# Patient Record
Sex: Female | Born: 1945 | Race: White | Hispanic: No | Smoking: Former smoker
Health system: Southern US, Community
[De-identification: ages and names within clinical notes are randomized; demographics above are authoritative.]

## PROBLEM LIST (undated history)

## (undated) DIAGNOSIS — K08109 Complete loss of teeth, unspecified cause, unspecified class: Secondary | ICD-10-CM

## (undated) DIAGNOSIS — F419 Anxiety disorder, unspecified: Secondary | ICD-10-CM

## (undated) DIAGNOSIS — T753XXA Motion sickness, initial encounter: Secondary | ICD-10-CM

## (undated) DIAGNOSIS — K219 Gastro-esophageal reflux disease without esophagitis: Secondary | ICD-10-CM

## (undated) DIAGNOSIS — M069 Rheumatoid arthritis, unspecified: Secondary | ICD-10-CM

## (undated) DIAGNOSIS — R42 Dizziness and giddiness: Secondary | ICD-10-CM

## (undated) DIAGNOSIS — K828 Other specified diseases of gallbladder: Secondary | ICD-10-CM

## (undated) DIAGNOSIS — R0600 Dyspnea, unspecified: Secondary | ICD-10-CM

## (undated) DIAGNOSIS — J432 Centrilobular emphysema: Secondary | ICD-10-CM

## (undated) DIAGNOSIS — D649 Anemia, unspecified: Secondary | ICD-10-CM

## (undated) DIAGNOSIS — H3500 Unspecified background retinopathy: Secondary | ICD-10-CM

## (undated) DIAGNOSIS — G43909 Migraine, unspecified, not intractable, without status migrainosus: Secondary | ICD-10-CM

## (undated) DIAGNOSIS — K760 Fatty (change of) liver, not elsewhere classified: Secondary | ICD-10-CM

## (undated) DIAGNOSIS — M810 Age-related osteoporosis without current pathological fracture: Secondary | ICD-10-CM

## (undated) DIAGNOSIS — M359 Systemic involvement of connective tissue, unspecified: Secondary | ICD-10-CM

## (undated) DIAGNOSIS — Z8781 Personal history of (healed) traumatic fracture: Secondary | ICD-10-CM

## (undated) DIAGNOSIS — M1990 Unspecified osteoarthritis, unspecified site: Secondary | ICD-10-CM

## (undated) DIAGNOSIS — K000 Anodontia: Secondary | ICD-10-CM

## (undated) DIAGNOSIS — N059 Unspecified nephritic syndrome with unspecified morphologic changes: Secondary | ICD-10-CM

## (undated) DIAGNOSIS — D8989 Other specified disorders involving the immune mechanism, not elsewhere classified: Secondary | ICD-10-CM

## (undated) DIAGNOSIS — D509 Iron deficiency anemia, unspecified: Secondary | ICD-10-CM

## (undated) HISTORY — PX: COLONOSCOPY: SHX174

## (undated) HISTORY — PX: ESOPHAGOGASTRODUODENOSCOPY: SHX1529

---

## 2001-03-16 HISTORY — PX: OOPHORECTOMY: SHX86

## 2006-01-20 ENCOUNTER — Ambulatory Visit: Payer: Self-pay | Admitting: Internal Medicine

## 2006-04-29 ENCOUNTER — Other Ambulatory Visit: Payer: Self-pay

## 2006-04-29 ENCOUNTER — Emergency Department: Payer: Self-pay | Admitting: Emergency Medicine

## 2007-02-15 ENCOUNTER — Other Ambulatory Visit: Payer: Self-pay

## 2007-02-15 ENCOUNTER — Emergency Department: Payer: Self-pay | Admitting: Emergency Medicine

## 2007-07-11 ENCOUNTER — Emergency Department: Payer: Self-pay | Admitting: Emergency Medicine

## 2008-09-12 ENCOUNTER — Ambulatory Visit: Payer: Self-pay | Admitting: Internal Medicine

## 2008-09-17 ENCOUNTER — Other Ambulatory Visit: Payer: Self-pay | Admitting: Ophthalmology

## 2008-09-28 ENCOUNTER — Ambulatory Visit: Payer: Self-pay | Admitting: Family Medicine

## 2008-10-12 ENCOUNTER — Ambulatory Visit: Payer: Self-pay | Admitting: Gastroenterology

## 2008-10-23 ENCOUNTER — Ambulatory Visit: Payer: Self-pay | Admitting: Gastroenterology

## 2008-10-31 ENCOUNTER — Ambulatory Visit: Payer: Self-pay | Admitting: Gastroenterology

## 2009-02-12 ENCOUNTER — Ambulatory Visit: Payer: Self-pay | Admitting: Internal Medicine

## 2009-02-19 ENCOUNTER — Ambulatory Visit: Payer: Self-pay | Admitting: Internal Medicine

## 2009-03-18 ENCOUNTER — Ambulatory Visit: Payer: Self-pay | Admitting: Gastroenterology

## 2009-06-01 ENCOUNTER — Emergency Department: Payer: Self-pay | Admitting: Emergency Medicine

## 2009-08-14 ENCOUNTER — Ambulatory Visit: Payer: Self-pay | Admitting: Oncology

## 2009-08-22 ENCOUNTER — Ambulatory Visit: Payer: Self-pay | Admitting: Internal Medicine

## 2009-09-11 ENCOUNTER — Ambulatory Visit: Payer: Self-pay | Admitting: Oncology

## 2009-09-13 ENCOUNTER — Ambulatory Visit: Payer: Self-pay | Admitting: Oncology

## 2009-10-14 ENCOUNTER — Ambulatory Visit: Payer: Self-pay | Admitting: Oncology

## 2010-06-13 ENCOUNTER — Ambulatory Visit: Payer: Self-pay | Admitting: Gastroenterology

## 2010-06-18 ENCOUNTER — Ambulatory Visit: Payer: Self-pay | Admitting: Gastroenterology

## 2010-06-18 LAB — PATHOLOGY REPORT

## 2011-06-05 ENCOUNTER — Ambulatory Visit: Payer: Self-pay | Admitting: Gastroenterology

## 2011-06-09 LAB — PATHOLOGY REPORT

## 2012-03-29 DIAGNOSIS — H53419 Scotoma involving central area, unspecified eye: Secondary | ICD-10-CM | POA: Insufficient documentation

## 2013-07-31 ENCOUNTER — Ambulatory Visit: Payer: Self-pay | Admitting: Internal Medicine

## 2013-08-03 DIAGNOSIS — M810 Age-related osteoporosis without current pathological fracture: Secondary | ICD-10-CM | POA: Insufficient documentation

## 2013-08-03 DIAGNOSIS — H359 Unspecified retinal disorder: Secondary | ICD-10-CM | POA: Insufficient documentation

## 2013-12-10 DIAGNOSIS — G43909 Migraine, unspecified, not intractable, without status migrainosus: Secondary | ICD-10-CM | POA: Insufficient documentation

## 2013-12-10 DIAGNOSIS — I10 Essential (primary) hypertension: Secondary | ICD-10-CM | POA: Insufficient documentation

## 2013-12-10 DIAGNOSIS — F5104 Psychophysiologic insomnia: Secondary | ICD-10-CM | POA: Insufficient documentation

## 2013-12-25 DIAGNOSIS — I700 Atherosclerosis of aorta: Secondary | ICD-10-CM | POA: Insufficient documentation

## 2014-01-22 DIAGNOSIS — M47814 Spondylosis without myelopathy or radiculopathy, thoracic region: Secondary | ICD-10-CM | POA: Insufficient documentation

## 2014-05-13 ENCOUNTER — Emergency Department: Payer: Self-pay | Admitting: Internal Medicine

## 2014-05-25 ENCOUNTER — Ambulatory Visit: Payer: Self-pay | Admitting: Internal Medicine

## 2014-06-21 DIAGNOSIS — R0602 Shortness of breath: Secondary | ICD-10-CM | POA: Insufficient documentation

## 2014-06-21 DIAGNOSIS — R5382 Chronic fatigue, unspecified: Secondary | ICD-10-CM | POA: Insufficient documentation

## 2014-06-27 ENCOUNTER — Ambulatory Visit: Admit: 2014-06-27 | Disposition: A | Discharge status: Home / Self Care | Payer: Self-pay | Admitting: Gastroenterology

## 2014-07-04 ENCOUNTER — Ambulatory Visit
Admit: 2014-07-04 | Disposition: A | Discharge status: Home / Self Care | Payer: Self-pay | Attending: Gastroenterology | Admitting: Gastroenterology

## 2014-10-08 ENCOUNTER — Emergency Department
Admission: EM | Admit: 2014-10-08 | Discharge: 2014-10-08 | Disposition: A | Discharge status: Home / Self Care | Payer: Medicare Other | Attending: Emergency Medicine | Admitting: Emergency Medicine

## 2014-10-08 ENCOUNTER — Encounter: Payer: Self-pay | Admitting: Emergency Medicine

## 2014-10-08 DIAGNOSIS — Y998 Other external cause status: Secondary | ICD-10-CM | POA: Insufficient documentation

## 2014-10-08 DIAGNOSIS — X58XXXA Exposure to other specified factors, initial encounter: Secondary | ICD-10-CM | POA: Diagnosis not present

## 2014-10-08 DIAGNOSIS — Z87891 Personal history of nicotine dependence: Secondary | ICD-10-CM | POA: Insufficient documentation

## 2014-10-08 DIAGNOSIS — Y9389 Activity, other specified: Secondary | ICD-10-CM | POA: Diagnosis not present

## 2014-10-08 DIAGNOSIS — S3992XA Unspecified injury of lower back, initial encounter: Secondary | ICD-10-CM | POA: Diagnosis present

## 2014-10-08 DIAGNOSIS — Y9289 Other specified places as the place of occurrence of the external cause: Secondary | ICD-10-CM | POA: Diagnosis not present

## 2014-10-08 DIAGNOSIS — S29012A Strain of muscle and tendon of back wall of thorax, initial encounter: Secondary | ICD-10-CM | POA: Insufficient documentation

## 2014-10-08 DIAGNOSIS — S29019A Strain of muscle and tendon of unspecified wall of thorax, initial encounter: Secondary | ICD-10-CM

## 2014-10-08 HISTORY — DX: Gastro-esophageal reflux disease without esophagitis: K21.9

## 2014-10-08 MED ORDER — TRAMADOL HCL 50 MG PO TABS: 50.0000 mg | ORAL_TABLET | Freq: Four times a day (QID) | ORAL | Status: DC | PRN

## 2014-10-08 MED ORDER — KETOROLAC TROMETHAMINE 30 MG/ML IJ SOLN
30.0000 mg | Freq: Once | INTRAMUSCULAR | Status: AC
  Administered 2014-10-08: 30 mg via INTRAMUSCULAR
  Filled 2014-10-08: qty 1

## 2014-10-08 MED ORDER — METHOCARBAMOL 500 MG PO TABS: 500.0000 mg | ORAL_TABLET | Freq: Four times a day (QID) | ORAL | Status: DC

## 2014-10-08 MED ORDER — HYDROMORPHONE HCL 1 MG/ML IJ SOLN
0.5000 mg | Freq: Once | INTRAMUSCULAR | Status: AC
Start: 2014-10-08 — End: 2014-10-08
  Administered 2014-10-08: 0.5 mg via INTRAMUSCULAR
  Filled 2014-10-08: qty 1

## 2014-10-08 MED ORDER — ORPHENADRINE CITRATE 30 MG/ML IJ SOLN
60.0000 mg | Freq: Two times a day (BID) | INTRAMUSCULAR | Status: DC
  Administered 2014-10-08: 60 mg via INTRAMUSCULAR
  Filled 2014-10-08: qty 2

## 2014-10-08 NOTE — ED Notes (Signed)
Developed mid to lower back after lifting heavy groceries . The pain is mainly mid back on palpation increased pain with ambulation or movement

## 2014-10-08 NOTE — ED Notes (Signed)
Pt to ed with c/o mid and lower back pain bilat that started on Friday after she lifted heavy groceries.  Pt states pain is constant and shooting, severe pain. Reports she went to see Chiropractor today but was sent here after she was in so much pain.

## 2014-10-08 NOTE — ED Notes (Signed)
Discharge instructions and prescriptions given and reviewed with patient.  Patient verbalized understanding to take medications as prescribed and to follow up with PMD or return to ED for new/worsening symptoms.  Patient discharged home in good condition via wheelchair.  Family member accompanied discharge to drive home.

## 2014-10-08 NOTE — ED Provider Notes (Signed)
[facility] Emergency Department Provider Note  ____________________________________________  Time seen: Approximately 12:34 PM  I have reviewed the triage vital signs and the nursing notes.   HISTORY  Chief Complaint Back Pain    HPI Ashley Mack is a 69 y.o. female complaining of sharp mid and low back pain secondary to a heavy list incident 3 days ago. She states she was lifting groceries and after that was his wishes*expressive back pain which has increased patient states she went to a crop practice today however due to her severe pain he advised to come to the emergency room. Patient denies any radicular component to this pain she denies any bladder or bowel dysfunction. Patient states she is unable sleep secondary to the pain. As stated over-the-counter medication has not helped.Patient is rating her pain as 8/10.   Past Medical History  Diagnosis Date  . GERD (gastroesophageal reflux disease)     There are no active problems to display for this patient.   Past Surgical History  Procedure Laterality Date  . Oophorectomy Bilateral     Current Outpatient Rx  Name  Route  Sig  Dispense  Refill  . methocarbamol (ROBAXIN) 500 MG tablet   Oral   Take 1 tablet (500 mg total) by mouth 4 (four) times daily.   20 tablet   0   . traMADol (ULTRAM) 50 MG tablet   Oral   Take 1 tablet (50 mg total) by mouth every 6 (six) hours as needed for moderate pain.   12 tablet   0     Allergies Review of patient's allergies indicates no known allergies.  History reviewed. No pertinent family history.  Social History History  Substance Use Topics  . Smoking status: Former Research scientist (life sciences)  . Smokeless tobacco: Not on file  . Alcohol Use: No    Review of Systems Constitutional: No fever/chills Eyes: No visual changes. ENT: No sore throat. Cardiovascular: Denies chest pain. Respiratory: Denies shortness of breath. Gastrointestinal: No abdominal pain.  No  nausea, no vomiting.  No diarrhea.  No constipation. Genitourinary: Negative for dysuria. Musculoskeletal: Negative for back pain. Skin: Negative for rash. Neurological: Negative for headaches, focal weakness or numbness. 10-point ROS otherwise negative.  ____________________________________________   PHYSICAL EXAM:  VITAL SIGNS: ED Triage Vitals  Enc Vitals Group     BP 10/08/14 1118 151/76 mmHg     Pulse Rate 10/08/14 1118 107     Resp 10/08/14 1118 18     Temp 10/08/14 1118 98.2 F (36.8 C)     Temp Source 10/08/14 1118 Oral     SpO2 10/08/14 1118 100 %     Weight 10/08/14 1118 142 lb (64.411 kg)     Height 10/08/14 1118 5\' 2"  (1.575 m)     Head Cir --      Peak Flow --      Pain Score --      Pain Loc --      Pain Edu? --      Excl. in [place]? --     Constitutional: Alert and oriented. Well appearing and in no acute distress. Eyes: Conjunctivae are normal. PERRL. EOMI. Head: Atraumatic. Nose: No congestion/rhinnorhea. Mouth/Throat: Mucous membranes are moist.  Oropharynx non-erythematous. Neck: No stridor.  No cervical spine tenderness to palpation. Hematological/Lymphatic/Immunilogical: No cervical lymphadenopathy. Cardiovascular: Normal rate, regular rhythm. Grossly normal heart sounds.  Good peripheral circulation. Respiratory: Normal respiratory effort.  No retractions. Lungs CTAB. Gastrointestinal: Soft and nontender. No distention. No  abdominal bruits. No CVA tenderness. Musculoskeletal: No lower extremity tenderness nor edema.  No joint effusions. No obvious spinal deformity. Patient's tender palpation mid thoracic to mid lumbar area. Patient has hesitant range of motion through all fields. Patient unable to raise her hands above his secondary to complain of pain in the midthoracic area. Palpable muscle spasm with movement of upper extremities and the thoracic area. Neurologic:  Normal speech and language. No gross focal neurologic deficits are appreciated. No gait  instability. Skin:  Skin is warm, dry and intact. No rash noted. Psychiatric: Mood and affect are normal. Speech and behavior are normal.  ____________________________________________   LABS (all labs ordered are listed, but only abnormal results are displayed)  Labs Reviewed - No data to display ____________________________________________  EKG   ____________________________________________  RADIOLOGY   ____________________________________________   PROCEDURES  Procedure(s) performed: None  Critical Care performed: No  ____________________________________________   INITIAL IMPRESSION / ASSESSMENT AND PLAN / ED COURSE  Pertinent labs & imaging results that were available during my care of the patient were reviewed by me and considered in my medical decision making (see chart for details).  Midthoracic t and lumbar strain. __Patient was given 0.5 mg of Dilaudid, 30 mg of Norflex, 30 mg of Toradol. Patient will be discharge for 3 days supply of tramadol and Flexeril. Patient advised follow-up with family doctor if she knows no improvement in next 2-3 days return back to ER if condition worsens. __________________________________________   FINAL CLINICAL IMPRESSION(S) / ED DIAGNOSES  Final diagnoses:  Thoracic myofascial strain, initial encounter      Sable Feil, PA-C 10/08/14 1259  Lavonia Drafts, MD 10/08/14 1341

## 2014-10-10 ENCOUNTER — Encounter: Payer: Self-pay | Admitting: Emergency Medicine

## 2014-10-10 ENCOUNTER — Emergency Department
Admission: EM | Admit: 2014-10-10 | Discharge: 2014-10-10 | Disposition: A | Discharge status: Home / Self Care | Payer: Medicare Other | Attending: Emergency Medicine | Admitting: Emergency Medicine

## 2014-10-10 ENCOUNTER — Emergency Department: Payer: Medicare Other

## 2014-10-10 DIAGNOSIS — S22089A Unspecified fracture of T11-T12 vertebra, initial encounter for closed fracture: Secondary | ICD-10-CM | POA: Insufficient documentation

## 2014-10-10 DIAGNOSIS — Z87891 Personal history of nicotine dependence: Secondary | ICD-10-CM | POA: Diagnosis not present

## 2014-10-10 DIAGNOSIS — Z79899 Other long term (current) drug therapy: Secondary | ICD-10-CM | POA: Diagnosis not present

## 2014-10-10 DIAGNOSIS — X58XXXA Exposure to other specified factors, initial encounter: Secondary | ICD-10-CM | POA: Diagnosis not present

## 2014-10-10 DIAGNOSIS — M4850XA Collapsed vertebra, not elsewhere classified, site unspecified, initial encounter for fracture: Secondary | ICD-10-CM

## 2014-10-10 DIAGNOSIS — Y9389 Activity, other specified: Secondary | ICD-10-CM | POA: Insufficient documentation

## 2014-10-10 DIAGNOSIS — Y9289 Other specified places as the place of occurrence of the external cause: Secondary | ICD-10-CM | POA: Insufficient documentation

## 2014-10-10 DIAGNOSIS — S3992XA Unspecified injury of lower back, initial encounter: Secondary | ICD-10-CM | POA: Diagnosis present

## 2014-10-10 DIAGNOSIS — IMO0002 Reserved for concepts with insufficient information to code with codable children: Secondary | ICD-10-CM

## 2014-10-10 DIAGNOSIS — Y998 Other external cause status: Secondary | ICD-10-CM | POA: Diagnosis not present

## 2014-10-10 DIAGNOSIS — IMO0001 Reserved for inherently not codable concepts without codable children: Secondary | ICD-10-CM

## 2014-10-10 HISTORY — DX: Unspecified osteoarthritis, unspecified site: M19.90

## 2014-10-10 MED ORDER — PREDNISONE 20 MG PO TABS
60.0000 mg | ORAL_TABLET | Freq: Once | ORAL | Status: AC
  Administered 2014-10-10: 60 mg via ORAL
  Filled 2014-10-10: qty 3

## 2014-10-10 MED ORDER — HYDROCODONE-ACETAMINOPHEN 5-325 MG PO TABS: 1.0000 | ORAL_TABLET | ORAL | Status: DC | PRN

## 2014-10-10 MED ORDER — ONDANSETRON 4 MG PO TBDP: 4.0000 mg | ORAL_TABLET | Freq: Three times a day (TID) | ORAL | Status: DC | PRN

## 2014-10-10 MED ORDER — ONDANSETRON 4 MG PO TBDP
4.0000 mg | ORAL_TABLET | Freq: Once | ORAL | Status: DC
  Filled 2014-10-10: qty 1

## 2014-10-10 MED ORDER — HYDROMORPHONE HCL 1 MG/ML IJ SOLN
1.0000 mg | Freq: Once | INTRAMUSCULAR | Status: AC
  Administered 2014-10-10: 1 mg via INTRAMUSCULAR
  Filled 2014-10-10: qty 1

## 2014-10-10 NOTE — ED Provider Notes (Signed)
[facility] Emergency Department Provider Note  Time seen: 8:52 AM  I have reviewed the triage vital signs and the nursing notes.   HISTORY  Chief Complaint Back Pain    HPI Ashley Mack is a 69 y.o. female with a past medical history of reflux, rheumatoid arthritis, who presents to the emergency department with lower back pain. According to the patient 6 days ago she lifted heavy groceries. She noted pain in her back, which is progressively worsened. She was seen in the emergency department 10/08/14 for continued lower back pain. She states her pain was improved after treatment in the emergency department, but worsened again at home. Describes her pain as severe when moving, and moderate even at rest. Pain is very positional. Patient denies any incontinence, denies any weakness or numbness, denies any sensory loss. Patient denies any fever.     Past Medical History  Diagnosis Date  . GERD (gastroesophageal reflux disease)   . Arthritis     There are no active problems to display for this patient.   Past Surgical History  Procedure Laterality Date  . Oophorectomy Bilateral     Current Outpatient Rx  Name  Route  Sig  Dispense  Refill  . methocarbamol (ROBAXIN) 500 MG tablet   Oral   Take 1 tablet (500 mg total) by mouth 4 (four) times daily.   20 tablet   0   . traMADol (ULTRAM) 50 MG tablet   Oral   Take 1 tablet (50 mg total) by mouth every 6 (six) hours as needed for moderate pain.   12 tablet   0     Allergies Other  No family history on file.  Social History History  Substance Use Topics  . Smoking status: Former Research scientist (life sciences)  . Smokeless tobacco: Not on file  . Alcohol Use: No    Review of Systems Constitutional: Negative for fever. Cardiovascular: Negative for chest pain. Respiratory: Negative for shortness of breath. Gastrointestinal: Negative for abdominal pain Genitourinary: Negative for dysuria. Negative for  incontinence. Musculoskeletal: Positive for back pain Neurological: Negative for weakness or numbness. 10-point ROS otherwise negative.  ____________________________________________   PHYSICAL EXAM:  VITAL SIGNS: ED Triage Vitals  Enc Vitals Group     BP 10/10/14 0823 139/77 mmHg     Pulse Rate 10/10/14 0823 92     Resp 10/10/14 0823 18     Temp 10/10/14 0823 97.7 F (36.5 C)     Temp Source 10/10/14 0823 Oral     SpO2 10/10/14 0823 95 %     Weight 10/10/14 0823 142 lb (64.411 kg)     Height 10/10/14 0823 5\' 2"  (1.575 m)     Head Cir --      Peak Flow --      Pain Score 10/10/14 0824 10     Pain Loc --      Pain Edu? --      Excl. in [place]? --     Constitutional: Alert and oriented. Mild distress when moving due to pain. ENT   Head: Normocephalic and atraumatic Cardiovascular: Normal rate, regular rhythm. No murmur Respiratory: Normal respiratory effort without tachypnea nor retractions. Breath sounds are clear and equal bilaterally. No wheezes/rales/rhonchi. Gastrointestinal: Soft and nontender. No distention.  No CVA tenderness to palpation. Musculoskeletal: Moderate/significant lumbar tenderness palpation especially in the paraspinal regions. Neurologic:  Normal speech and language. No gross focal neurologic deficits are appreciated. Speech is normal. Normal sensation in bilateral lower extremities. 2+  patellar reflexes bilaterally. Skin:  Skin is warm, dry  Psychiatric: Mood and affect are normal. Speech and behavior are normal.  ____________________________________________   RADIOLOGY  X-ray consistent with T11 mild compression fracture  ____________________________________________    INITIAL IMPRESSION / ASSESSMENT AND PLAN / ED COURSE  Pertinent labs & imaging results that were available during my care of the patient were reviewed by me and considered in my medical decision making (see chart for details).  Patient with acute onset of lumbar back pain 6  days ago after lifting heavy groceries. Patient states the pain has not improved since then. Was seen for this same complaint 2 days ago and discharged on Robaxin and Ultram. Patient states her pain continues to be significant especially when she attempts to move so she came back to the emergency department for a second evaluation. Denies any radiation of the pain. The leg. Denies any incontinence. Patient has a normal neurologic exam including normal reflexes in her lower extremities. Do not suspect any nerve compression. Patient has mild midline lumbar tenderness palpation, most of her tenderness is in the bilateral lumbar paraspinal muscles. We will proceed with a lumbar x-ray to help rule out compression fracture. We will treat the patient's pain/discomfort. We will start the patient on a course of steroids to help decrease inflammation. X-ray consistent with T11 compression fracture. This appears mild. On reexamination the patient is deathly more tender over this area than the lower lumbar spine. We will place the patient on Norco, and have her follow up with her primary care doctor. Patient is agreeable. ____________________________________________   FINAL CLINICAL IMPRESSION(S) / ED DIAGNOSES  T11 compression fracture   Harvest Dark, MD 10/10/14 1139

## 2014-10-10 NOTE — ED Notes (Signed)
Last Friday picked up a bag , since having lower back pain , was seen here on Monday for same and was encouraged to return if pain worsened , pt awoke thiS am with worsening pain , denies any sensation loss to extremities .

## 2014-10-10 NOTE — Discharge Instructions (Signed)
Lumbar Fracture °A fracture of a bone is the same as a break in the bone. A fracture in the lumbar area is a break that involves one of many parts that make up the 5 bones of the low back area. This is just above the pelvis.  °CAUSES °Most of these injuries occur as a result of an accident such as: °· A fall. °· A car accident. °· Recreational activities. °· A smaller number occur due to: °¨ Industrial, farm, and aviation accidents. °¨ Gunshot wounds and direct blows to the back. °¨ Parachuting incidents. °Most lumbar fractures affect the "building blocks" or the main portion of the spine known as the "vertebral bodies" (see the image on the right). A smaller number involve breaks to portions of bone that extend to the sides or backward behind the vertebral body. In the elderly, a sudden break can happen without an apparent cause. This is because the bones of the back have become extremely thin and fragile. This condition is known as osteoporosis. °SYMPTOMS °Patients with lumbar fractures have severe pain even if the actual break is small or limited, and there is no injury to nearby nerves. More severe or complex injuries involving other bones and/or organs may include:  °· Deformity of the back bones. °· Swelling/bruising over the injured area. °· Limited ability to move the affected area. °· Partial or complete loss of function of the bladder and/or bowels. (This may be due to injury to nearby nerves). °· More severe injuries can also cause: °¨ Loss of sensation and/or strength in the legs, feet, and toes. °¨ Paralysis. °DIAGNOSIS °In most cases, a lumbar fracture will be suspected by what happened just prior to the onset of back pain. X-rays and special imaging (CT scan and MRI imaging) are used to confirm the diagnosis as well as finding out the type and severity of the break or breaks. These tests guide treatment. But there are times when special imaging cannot be done. For example, MRI cannot be done if there  is an implanted metallic device (such as a pacemaker). In these cases, other tests and imaging are done. °If there has been nerve damage, more tests can be done. These include: °· Tests of nerve function through muscles (nerve conduction studies and electromyography). °· Tests of bladder function (urodynamics). °· Tests that focus on defining specific nerve problems before surgery and what improvement has come about after surgery (evoked potentials). °TREATMENT °Common injuries may involve a small break off of the main surface of the back bone. Or they may be in the form of a partial flattening or compression of the bone. Hospital care may not be needed for these. Medicine for pain control, special back bracing, and limitations in activity are done first. Physical therapy follows later. °Complex breaks, multiple fractures of the spine, or unstable injuries can damage the spinal cord. They may require an operation to remove pressure from the nerves and/or spinal cord and to stabilize the broken pieces of bone. Each individual set of injuries is unique. The surgeon will take into consideration many things when planning the best surgical approach that will give the highest likelihood of a good outcome.  °HOME CARE INSTRUCTIONS °There is pain and stiffness in the back for weeks after a vertebral fracture. Bed rest, pain medicine, and a slow return to activity are generally recommended. Neck and back braces may be helpful in reducing pain and increasing mobility. When your pain allows, simple walking will help to begin the   process of returning to normal activities. Exercises to improve motion and to strengthen the back may also be useful after the initial pain goes away. This will be guided by your caregiver and the team (nurses, physical therapists, occupational therapists, etc.) involved with your ongoing care. For the elderly, treatment for osteoporosis may be needed to help reduce the risk of fractures in the  future. °Arrange for follow-up care as recommended to assure proper long-term care and prevention of further spine injury. The failure to follow-up as recommended could result in permanent injury, disability, and a chronic painful condition. °SEEK MEDICAL CARE IF: °· Pain is not effectively controlled with medication. °· You feel unable to decrease pain medication over time as planned. °· Activity level is not improving as planned and/or expected. °SEEK IMMEDIATE MEDICAL CARE IF: °· You have increasing pain, vomiting, or are unable to move around at all. °· You have numbness, tingling, weakness, or paralysis of any part of your body. °· You have loss of normal bowel or bladder control. °· You have difficulty breathing, cough, fever, chest or abdominal pain. °Document Released: 06/17/2006 Document Revised: 05/25/2011 Document Reviewed: 02/15/2007 °[employer]® Patient Information ©2015 [employer]. This information is not intended to replace advice given to you by your health care provider. Make sure you discuss any questions you have with your health care provider. ° °

## 2014-12-24 ENCOUNTER — Encounter: Payer: Self-pay | Admitting: Emergency Medicine

## 2014-12-24 ENCOUNTER — Emergency Department
Admission: EM | Admit: 2014-12-24 | Discharge: 2014-12-24 | Disposition: A | Discharge status: Home / Self Care | Payer: Medicare Other | Attending: Emergency Medicine | Admitting: Emergency Medicine

## 2014-12-24 ENCOUNTER — Emergency Department: Payer: Medicare Other

## 2014-12-24 DIAGNOSIS — Z79899 Other long term (current) drug therapy: Secondary | ICD-10-CM | POA: Insufficient documentation

## 2014-12-24 DIAGNOSIS — Z87891 Personal history of nicotine dependence: Secondary | ICD-10-CM | POA: Diagnosis not present

## 2014-12-24 DIAGNOSIS — Y998 Other external cause status: Secondary | ICD-10-CM | POA: Insufficient documentation

## 2014-12-24 DIAGNOSIS — S22080A Wedge compression fracture of T11-T12 vertebra, initial encounter for closed fracture: Secondary | ICD-10-CM | POA: Diagnosis not present

## 2014-12-24 DIAGNOSIS — Y9389 Activity, other specified: Secondary | ICD-10-CM | POA: Diagnosis not present

## 2014-12-24 DIAGNOSIS — W1839XA Other fall on same level, initial encounter: Secondary | ICD-10-CM | POA: Insufficient documentation

## 2014-12-24 DIAGNOSIS — Y9289 Other specified places as the place of occurrence of the external cause: Secondary | ICD-10-CM | POA: Diagnosis not present

## 2014-12-24 DIAGNOSIS — IMO0002 Reserved for concepts with insufficient information to code with codable children: Secondary | ICD-10-CM

## 2014-12-24 DIAGNOSIS — S299XXA Unspecified injury of thorax, initial encounter: Secondary | ICD-10-CM | POA: Diagnosis present

## 2014-12-24 MED ORDER — ONDANSETRON 4 MG PO TBDP
ORAL_TABLET | ORAL | Status: AC
Start: 2014-12-24 — End: 2014-12-24
  Administered 2014-12-24: 4 mg via ORAL
  Filled 2014-12-24: qty 1

## 2014-12-24 MED ORDER — ONDANSETRON 4 MG PO TBDP
4.0000 mg | ORAL_TABLET | Freq: Once | ORAL | Status: AC
  Administered 2014-12-24: 4 mg via ORAL

## 2014-12-24 MED ORDER — OXYCODONE-ACETAMINOPHEN 5-325 MG PO TABS: 1.0000 | ORAL_TABLET | Freq: Four times a day (QID) | ORAL | Status: DC | PRN

## 2014-12-24 MED ORDER — ONDANSETRON 4 MG PO TBDP: 4.0000 mg | ORAL_TABLET | Freq: Three times a day (TID) | ORAL | Status: DC | PRN

## 2014-12-24 MED ORDER — MORPHINE SULFATE (PF) 4 MG/ML IV SOLN
4.0000 mg | Freq: Once | INTRAVENOUS | Status: AC
  Administered 2014-12-24: 4 mg via INTRAMUSCULAR

## 2014-12-24 MED ORDER — MORPHINE SULFATE (PF) 4 MG/ML IV SOLN
INTRAVENOUS | Status: AC
  Administered 2014-12-24: 4 mg via INTRAMUSCULAR
  Filled 2014-12-24: qty 1

## 2014-12-24 NOTE — Discharge Instructions (Signed)
You have been seen in the emergency department today for back pain. Your CT scan shows a new compression fracture at T12. Please take your pain and nausea medication as needed, as written. Please follow-up with orthopedics by calling the number provided to obtain a follow-up appointment. Return to the emergency department for any worsening pain, or any other symptom personally concerning to her self. Please follow-up with her primary care physician within the next week for continuity of care.

## 2014-12-24 NOTE — ED Provider Notes (Signed)
[facility] Emergency Department Provider Note  Time seen: 2:50 PM  I have reviewed the triage vital signs and the nursing notes.   HISTORY  Chief Complaint Back Pain    HPI Ashley Mack is a 69 y.o. female with a past medical history of gastric reflux, arthritis, presents the emergency department with back pain. According to the patient she had a fall 5 days ago and has had back pain since. She saw her primary care doctor today who obtained x-rays showing a compression fracture of T11/T12, and due to the discomfort the patient was then sent her to the emergency department for further evaluation. Patient denies any other injuries. Denies loss of consciousness or hitting her head. Denies urinary incontinence, focal weakness or numbness. States the pain as moderate, severe when she moves. Better if she lies down.    Past Medical History  Diagnosis Date  . GERD (gastroesophageal reflux disease)   . Arthritis     There are no active problems to display for this patient.   Past Surgical History  Procedure Laterality Date  . Oophorectomy Bilateral     Current Outpatient Rx  Name  Route  Sig  Dispense  Refill  . Cholecalciferol (VITAMIN D3) 2000 UNITS capsule   Oral   Take 1 capsule by mouth daily.         Marland Kitchen DEXILANT 60 MG capsule   Oral   Take 1 capsule by mouth daily.           Dispense as written.   . Fe Fum-FePoly-Vit C-Vit B3 (INTEGRA) 62.5-62.5-40-3 MG CAPS   Oral   Take 1 capsule by mouth daily.         Marland Kitchen HYDROcodone-acetaminophen (NORCO/VICODIN) 5-325 MG per tablet   Oral   Take 1 tablet by mouth every 4 (four) hours as needed for moderate pain.   20 tablet   0   . methocarbamol (ROBAXIN) 500 MG tablet   Oral   Take 1 tablet (500 mg total) by mouth 4 (four) times daily.   20 tablet   0   . ondansetron (ZOFRAN ODT) 4 MG disintegrating tablet   Oral   Take 1 tablet (4 mg total) by mouth every 8 (eight) hours as needed for  nausea or vomiting.   20 tablet   0   . predniSONE (DELTASONE) 10 MG tablet   Oral   Take 1 tablet by mouth daily.         . traMADol (ULTRAM) 50 MG tablet   Oral   Take 1 tablet (50 mg total) by mouth every 6 (six) hours as needed for moderate pain.   12 tablet   0     Allergies Hydrocodone-acetaminophen; Meloxicam; Other; Promethazine hcl; Ranitidine; Sulfasalazine; and Topiramate  History reviewed. No pertinent family history.  Social History Social History  Substance Use Topics  . Smoking status: Former Research scientist (life sciences)  . Smokeless tobacco: None  . Alcohol Use: No    Review of Systems Constitutional: Negative for fever Cardiovascular: Negative for chest pain. Respiratory: Negative for shortness of breath. Gastrointestinal: Negative for abdominal pain Genitourinary: Negative for incontinence Musculoskeletal: Positive for mid to lower back pain. Denies neck pain. Neurological: Negative for headache. Denies focal weakness or numbness. 10-point ROS otherwise negative.  ____________________________________________   PHYSICAL EXAM:  VITAL SIGNS: ED Triage Vitals  Enc Vitals Group     BP 12/24/14 1404 157/90 mmHg     Pulse Rate 12/24/14 1404 98  Resp --      Temp 12/24/14 1404 98.2 F (36.8 C)     Temp Source 12/24/14 1404 Oral     SpO2 12/24/14 1404 98 %     Weight 12/24/14 1404 141 lb (63.957 kg)     Height 12/24/14 1404 5\' 2"  (1.575 m)     Head Cir --      Peak Flow --      Pain Score 12/24/14 1408 10     Pain Loc --      Pain Edu? --      Excl. in [place]? --     Constitutional: Alert and oriented. Well appearing and in no distress. Eyes: Normal exam ENT   Head: Normocephalic and atraumatic.   Mouth/Throat: Mucous membranes are moist. Cardiovascular: Normal rate, regular rhythm. No murmur Respiratory: Normal respiratory effort without tachypnea nor retractions. Breath sounds are clear  Gastrointestinal: Soft and nontender. No distention.    Musculoskeletal: Nontender with normal range of motion in all extremities. Moderate lower T-spine, and upper L-spine tenderness to palpation. Neurologic:  Normal speech and language. No gross focal neurologic deficits. Motor and sensation intact in bilateral lower extremities. Psychiatric: Mood and affect are normal. Speech and behavior are normal.   ____________________________________________   RADIOLOGY  Mild T12 compression fracture  ____________________________________________    INITIAL IMPRESSION / ASSESSMENT AND PLAN / ED COURSE  Pertinent labs & imaging results that were available during my care of the patient were reviewed by me and considered in my medical decision making (see chart for details).  Patient presents for mid to lower back pain since a fall 5 days ago. Saw her PCP today who ordered x-ray showing T11/T12 compression fracture. Due to the patient's discomfort the patient was sent to the emergency department for further evaluation and treatment. Patient appears to be in pain, but denies any incontinence, focal weakness or numbness. Has been ambulatory since the fall. We'll obtain CT imaging of the patient's thoracic and lumbar spine, dose IM morphine, and closely monitor in the emergency department.  CT shows stable T11 compression fracture, mild T12 compression fracture. Patient is feeling better after morphine, but remains in pain. We will discharge on Percocet and Zofran, with primary care and orthopedics follow-up. Patient agreeable to plan.  ____________________________________________   FINAL CLINICAL IMPRESSION(S) / ED DIAGNOSES  Back pain compression fracture    Harvest Dark, MD 12/24/14 [phone]

## 2014-12-24 NOTE — ED Notes (Signed)
Pt s/p fall Thursday where she tripped on concrete. Went to Dr. Tonette Bihari office today. Per pt, xrays showed T11/T12 fx. MD recommended MRI. Pt was told to come here because pain 10/10. Pt denies weakness, changes in bowel or bladder.

## 2015-05-06 ENCOUNTER — Emergency Department: Payer: Medicare Other

## 2015-05-06 ENCOUNTER — Encounter: Payer: Self-pay | Admitting: Radiology

## 2015-05-06 ENCOUNTER — Emergency Department
Admission: EM | Admit: 2015-05-06 | Discharge: 2015-05-06 | Disposition: A | Discharge status: Home / Self Care | Payer: Medicare Other | Attending: Emergency Medicine | Admitting: Emergency Medicine

## 2015-05-06 DIAGNOSIS — R05 Cough: Secondary | ICD-10-CM | POA: Diagnosis present

## 2015-05-06 DIAGNOSIS — Z7952 Long term (current) use of systemic steroids: Secondary | ICD-10-CM | POA: Diagnosis not present

## 2015-05-06 DIAGNOSIS — Z79899 Other long term (current) drug therapy: Secondary | ICD-10-CM | POA: Diagnosis not present

## 2015-05-06 DIAGNOSIS — J40 Bronchitis, not specified as acute or chronic: Secondary | ICD-10-CM | POA: Insufficient documentation

## 2015-05-06 DIAGNOSIS — Z87891 Personal history of nicotine dependence: Secondary | ICD-10-CM | POA: Diagnosis not present

## 2015-05-06 DIAGNOSIS — J209 Acute bronchitis, unspecified: Secondary | ICD-10-CM

## 2015-05-06 DIAGNOSIS — J9801 Acute bronchospasm: Secondary | ICD-10-CM | POA: Diagnosis not present

## 2015-05-06 LAB — BASIC METABOLIC PANEL
Anion gap: 7 (ref 5–15)
BUN: 8 mg/dL (ref 6–20)
CHLORIDE: 93 mmol/L — AB (ref 101–111)
CO2: 31 mmol/L (ref 22–32)
CREATININE: 0.67 mg/dL (ref 0.44–1.00)
Calcium: 8 mg/dL — ABNORMAL LOW (ref 8.9–10.3)
GFR calc non Af Amer: >60 mL/min (ref >60)
Glucose, Bld: 133 mg/dL — ABNORMAL HIGH (ref 65–99)
Potassium: 3.3 mmol/L — ABNORMAL LOW (ref 3.5–5.1)
Sodium: 131 mmol/L — ABNORMAL LOW (ref 135–145)

## 2015-05-06 LAB — CBC
HCT: 34.7 % — ABNORMAL LOW (ref 35.0–47.0)
HEMOGLOBIN: 11.4 g/dL — AB (ref 12.0–16.0)
MCH: 26.6 pg (ref 26.0–34.0)
MCHC: 32.9 g/dL (ref 32.0–36.0)
MCV: 80.9 fL (ref 80.0–100.0)
PLATELETS: 536 10*3/uL — AB (ref 150–440)
RBC: 4.3 MIL/uL (ref 3.80–5.20)
RDW: 16.7 % — ABNORMAL HIGH (ref 11.5–14.5)
WBC: 10.9 10*3/uL (ref 3.6–11.0)

## 2015-05-06 LAB — TROPONIN I: Troponin I: <0.03 ng/mL (ref <0.031)

## 2015-05-06 MED ORDER — IOHEXOL 350 MG/ML SOLN
75.0000 mL | Freq: Once | INTRAVENOUS | Status: AC | PRN
Start: 2015-05-06 — End: 2015-05-06
  Administered 2015-05-06: 75 mL via INTRAVENOUS

## 2015-05-06 MED ORDER — ALBUTEROL SULFATE HFA 108 (90 BASE) MCG/ACT IN AERS: 2.0000 | INHALATION_SPRAY | Freq: Four times a day (QID) | RESPIRATORY_TRACT | Status: DC | PRN

## 2015-05-06 MED ORDER — IPRATROPIUM-ALBUTEROL 0.5-2.5 (3) MG/3ML IN SOLN
3.0000 mL | Freq: Once | RESPIRATORY_TRACT | Status: AC
  Administered 2015-05-06: 3 mL via RESPIRATORY_TRACT
  Filled 2015-05-06: qty 3

## 2015-05-06 NOTE — ED Notes (Addendum)
Pt reports feeling weak and "bad" starting approx 12 days ago, with cough starting 1 week ago.  Pt has since seen her primary care physician 3 times, and has completed 1 round of azythromycin and started another antibiotic.  Pt states she "just isn't feeling any better" and continues to cough up sputum.  Pt now states extreme weakness and some shortness of breath.

## 2015-05-06 NOTE — ED Provider Notes (Signed)
[facility] Emergency Department Provider Note   ____________________________________________  Time seen: Approximately 7:10 AM I have reviewed the triage vital signs and the triage nursing note.  HISTORY  Chief Complaint Cough; Nasal Congestion; Weakness; and Shortness of Breath   Historian Patient  HPI Ashley Mack is a 70 y.o. female who reportedly lives alone but her son and stepdaughter stay with her, is here for shortness of breath cough, congestion, and generalized weakness since a week from last Thursday. She was seen this past Monday at her primary care physician's office and started azithromycin and prednisone. She saw her physician again on Wednesday as well as on Friday. On Friday she started another antibiotic. She states she's got no better. No nausea, vomiting, or diarrhea. No chest pain. Denies history of lung problems including COPD or asthma. No history of inhaler use in the past. Poor by mouth intake. White sputum, no bloody sputum. No fever.    Past Medical History  Diagnosis Date  . GERD (gastroesophageal reflux disease)   . Arthritis     There are no active problems to display for this patient.   Past Surgical History  Procedure Laterality Date  . Oophorectomy Bilateral     Current Outpatient Rx  Name  Route  Sig  Dispense  Refill  . albuterol (PROVENTIL HFA;VENTOLIN HFA) 108 (90 Base) MCG/ACT inhaler   Inhalation   Inhale 2 puffs into the lungs every 6 (six) hours as needed for wheezing or shortness of breath.   1 Inhaler   0   . Cholecalciferol (VITAMIN D3) 2000 UNITS capsule   Oral   Take 1 capsule by mouth daily.         Marland Kitchen DEXILANT 60 MG capsule   Oral   Take 1 capsule by mouth daily.           Dispense as written.   . Fe Fum-FePoly-Vit C-Vit B3 (INTEGRA) 62.5-62.5-40-3 MG CAPS   Oral   Take 1 capsule by mouth daily.         Marland Kitchen HYDROcodone-acetaminophen (NORCO/VICODIN) 5-325 MG per tablet   Oral   Take 1  tablet by mouth every 4 (four) hours as needed for moderate pain.   20 tablet   0   . methocarbamol (ROBAXIN) 500 MG tablet   Oral   Take 1 tablet (500 mg total) by mouth 4 (four) times daily.   20 tablet   0   . ondansetron (ZOFRAN ODT) 4 MG disintegrating tablet   Oral   Take 1 tablet (4 mg total) by mouth every 8 (eight) hours as needed for nausea or vomiting.   20 tablet   0   . oxyCODONE-acetaminophen (ROXICET) 5-325 MG tablet   Oral   Take 1 tablet by mouth every 6 (six) hours as needed.   20 tablet   0   . predniSONE (DELTASONE) 10 MG tablet   Oral   Take 1 tablet by mouth daily.         . traMADol (ULTRAM) 50 MG tablet   Oral   Take 1 tablet (50 mg total) by mouth every 6 (six) hours as needed for moderate pain.   12 tablet   0     Allergies Hydrocodone-acetaminophen; Meloxicam; Other; Promethazine hcl; Ranitidine; Sulfasalazine; and Topiramate  No family history on file.  Social History Social History  Substance Use Topics  . Smoking status: Former Research scientist (life sciences)  . Smokeless tobacco: None  . Alcohol Use: No  Review of Systems  Constitutional: Negative for fever. Eyes: Negative for visual changes. ENT: Negative for sore throat. Cardiovascular: Negative for chest pain. Respiratory: Positive for shortness of breath. Gastrointestinal: Negative for abdominal pain, vomiting and diarrhea. Genitourinary: Negative for dysuria. Musculoskeletal: Negative for back pain. Skin: Negative for rash. Neurological: Negative for headache. 10 point Review of Systems otherwise negative ____________________________________________   PHYSICAL EXAM:  VITAL SIGNS: ED Triage Vitals  Enc Vitals Group     BP 05/06/15 0640 170/100 mmHg     Pulse Rate 05/06/15 0640 110     Resp 05/06/15 0640 20     Temp 05/06/15 0640 97 F (36.1 C)     Temp Source 05/06/15 0640 Oral     SpO2 05/06/15 0640 96 %     Weight 05/06/15 0640 135 lb (61.236 kg)     Height 05/06/15 0640 5'  2" (1.575 m)     Head Cir --      Peak Flow --      Pain Score --      Pain Loc --      Pain Edu? --      Excl. in [place]? --      Constitutional: Alert and oriented. Well appearing and in no distress. HEENT   Head: Normocephalic and atraumatic.      Eyes: Conjunctivae are normal. PERRL. Normal extraocular movements.      Ears:         Nose: No congestion/rhinnorhea.   Mouth/Throat: Mucous membranes are mildly dry.   Neck: No stridor. Cardiovascular/Chest: Normal rate, regular rhythm.  No murmurs, rubs, or gallops. Respiratory: Normal respiratory effort without tachypnea nor retractions. Moderate rhonchi throughout all lung fields. End expiratory wheezing especially with deep breath posterior lung fields. Gastrointestinal: Soft. No distention, no guarding, no rebound. Nontender.    Genitourinary/rectal:Deferred Musculoskeletal: Nontender with normal range of motion in all extremities. No joint effusions.  No lower extremity tenderness.  No edema. Neurologic:  Normal speech and language. No gross or focal neurologic deficits are appreciated. Skin:  Skin is warm, dry and intact. No rash noted. Psychiatric: Mood and affect are normal. Speech and behavior are normal. Patient exhibits appropriate insight and judgment.  ____________________________________________   EKG I, Lisa Roca, MD, the attending physician have personally viewed and interpreted all ECGs.  100 bpm. Normal sinus rhythm. Narrow QRS. Normal axis. Normal ST and T-wave ____________________________________________  LABS (pertinent positives/negatives)  Basic metabolic panel significant for sodium 131, potassium 3.3 otherwise without significant abnormality White blood count 0.9, hemoglobin 11.4 and platelet count 536 Troponin less than 0.03  ____________________________________________  RADIOLOGY All Xrays were viewed by me. Imaging interpreted by Radiologist.  Chest 2 view:IMPRESSION: Emphysema. No  focal consolidation or pneumothorax.  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  Chest CT for PE:  IMPRESSION: No evidence of pulmonary embolism or other acute findings.  Mild emphysema. __________________________________________  PROCEDURES  Procedure(s) performed: None  Critical Care performed: None  ____________________________________________   ED COURSE / ASSESSMENT AND PLAN  Pertinent labs & imaging results that were available during my care of the patient were reviewed by me and considered in my medical decision making (see chart for details).   Patient's here with symptoms of bronchitis for about 10 days. She still feels generally weak with some shortness of breath cough and weight phlegm.  She does have some wheezing on exam and we'll try a breathing treatment of albuterol. Labs are  reassuring. Chest x-ray shows no pneumonia.  Given that she's had symptoms over 10 days now without improvement after azithromycin and prednisone, and oxygenation 92% without a history provided of COPD or lung issues, I am going to go ahead and CT to rule out PE with initial tachycardia.  Clinically I do suspect of bronchitis with bronchospasm. Her chest x-ray does show emphysema, and still though she has not been diagnosed with COPD, is likely this is contributing to her prolonged episode of bronchitis this time.  Patient feels much better after DuoNeb. She'll be discharged with albuterol.    CONSULTATIONS:   None   Patient / Family / Caregiver informed of clinical course, medical decision-making process, and agree with plan.   I discussed return precautions, follow-up instructions, and discharged instructions with patient and/or family.   ___________________________________________   FINAL CLINICAL IMPRESSION(S) / ED DIAGNOSES   Final diagnoses:  Bronchitis with bronchospasm              Note: This  dictation was prepared with Dragon dictation. Any transcriptional errors that result from this process are unintentional   Lisa Roca, MD 05/06/15 1359

## 2015-05-06 NOTE — Discharge Instructions (Signed)
You were evaluated for cough and generalized weakness and found to have a reassuring examination and emergency department. I do suspect bronchitis with bronchospasm. I am adding albuterol inhaler to help with your breathing. Continue your other medications. Follow-up with the primary care physician. Return to the emergency department for any worsening condition including trouble breathing, chest pain, confusion altered mental status, fever, or any other symptoms concerning to you.  Bronchospasm, Adult A bronchospasm is a spasm or tightening of the airways going into the lungs. During a bronchospasm breathing becomes more difficult because the airways get smaller. When this happens there can be coughing, a whistling sound when breathing (wheezing), and difficulty breathing. Bronchospasm is often associated with asthma, but not all patients who experience a bronchospasm have asthma. CAUSES  A bronchospasm is caused by inflammation or irritation of the airways. The inflammation or irritation may be triggered by:   Allergies (such as to animals, pollen, food, or mold). Allergens that cause bronchospasm may cause wheezing immediately after exposure or many hours later.   Infection. Viral infections are believed to be the most common cause of bronchospasm.   Exercise.   Irritants (such as pollution, cigarette smoke, strong odors, aerosol sprays, and paint fumes).   Weather changes. Winds increase molds and pollens in the air. Rain refreshes the air by washing irritants out. Cold air may cause inflammation.   Stress and emotional upset.  SIGNS AND SYMPTOMS   Wheezing.   Excessive nighttime coughing.   Frequent or severe coughing with a simple cold.   Chest tightness.   Shortness of breath.  DIAGNOSIS  Bronchospasm is usually diagnosed through a history and physical exam. Tests, such as chest X-rays, are sometimes done to look for other conditions. TREATMENT   Inhaled medicines  can be given to open up your airways and help you breathe. The medicines can be given using either an inhaler or a nebulizer machine.  Corticosteroid medicines may be given for severe bronchospasm, usually when it is associated with asthma. HOME CARE INSTRUCTIONS   Always have a plan prepared for seeking medical care. Know when to call your health care provider and local emergency services (911 in the [place] Know where you can access local emergency care.  Only take medicines as directed by your health care provider.  If you were prescribed an inhaler or nebulizer machine, ask your health care provider to explain how to use it correctly. Always use a spacer with your inhaler if you were given one.  It is necessary to remain calm during an attack. Try to relax and breathe more slowly.  Control your home environment in the following ways:   Change your heating and air conditioning filter at least once a month.   Limit your use of fireplaces and wood stoves.  Do not smoke and do not allow smoking in your home.   Avoid exposure to perfumes and fragrances.   Get rid of pests (such as roaches and mice) and their droppings.   Throw away plants if you see mold on them.   Keep your house clean and dust free.   Replace carpet with wood, tile, or vinyl flooring. Carpet can trap dander and dust.   Use allergy-proof pillows, mattress covers, and box spring covers.   Wash bed sheets and blankets every week in hot water and dry them in a dryer.   Use blankets that are made of polyester or cotton.   Wash hands frequently. SEEK MEDICAL CARE IF:   You  have muscle aches.   You have chest pain.   The sputum changes from clear or white to yellow, green, gray, or bloody.   The sputum you cough up gets thicker.   There are problems that may be related to the medicine you are given, such as a rash, itching, swelling, or trouble breathing.  SEEK IMMEDIATE MEDICAL CARE IF:    You have worsening wheezing and coughing even after taking your prescribed medicines.   You have increased difficulty breathing.   You develop severe chest pain. MAKE SURE YOU:   Understand these instructions.  Will watch your condition.  Will get help right away if you are not doing well or get worse.   This information is not intended to replace advice given to you by your health care provider. Make sure you discuss any questions you have with your health care provider.   Document Released: 03/05/2003 Document Revised: 03/23/2014 Document Reviewed: 08/22/2012 Elsevier Interactive Patient Education 2016 Elsevier Inc.   Acute Bronchitis Bronchitis is inflammation of the airways that extend from the windpipe into the lungs (bronchi). The inflammation often causes mucus to develop. This leads to a cough, which is the most common symptom of bronchitis.  In acute bronchitis, the condition usually develops suddenly and goes away over time, usually in a couple weeks. Smoking, allergies, and asthma can make bronchitis worse. Repeated episodes of bronchitis may cause further lung problems.  CAUSES Acute bronchitis is most often caused by the same virus that causes a cold. The virus can spread from person to person (contagious) through coughing, sneezing, and touching contaminated objects. SIGNS AND SYMPTOMS   Cough.   Fever.   Coughing up mucus.   Body aches.   Chest congestion.   Chills.   Shortness of breath.   Sore throat.  DIAGNOSIS  Acute bronchitis is usually diagnosed through a physical exam. Your health care provider will also ask you questions about your medical history. Tests, such as chest X-rays, are sometimes done to rule out other conditions.  TREATMENT  Acute bronchitis usually goes away in a couple weeks. Oftentimes, no medical treatment is necessary. Medicines are sometimes given for relief of fever or cough. Antibiotic medicines are usually not  needed but may be prescribed in certain situations. In some cases, an inhaler may be recommended to help reduce shortness of breath and control the cough. A cool mist vaporizer may also be used to help thin bronchial secretions and make it easier to clear the chest.  HOME CARE INSTRUCTIONS  Get plenty of rest.   Drink enough fluids to keep your urine clear or pale yellow (unless you have a medical condition that requires fluid restriction). Increasing fluids may help thin your respiratory secretions (sputum) and reduce chest congestion, and it will prevent dehydration.   Take medicines only as directed by your health care provider.  If you were prescribed an antibiotic medicine, finish it all even if you start to feel better.  Avoid smoking and secondhand smoke. Exposure to cigarette smoke or irritating chemicals will make bronchitis worse. If you are a smoker, consider using nicotine gum or skin patches to help control withdrawal symptoms. Quitting smoking will help your lungs heal faster.   Reduce the chances of another bout of acute bronchitis by washing your hands frequently, avoiding people with cold symptoms, and trying not to touch your hands to your mouth, nose, or eyes.   Keep all follow-up visits as directed by your health care provider.  SEEK  MEDICAL CARE IF: Your symptoms do not improve after 1 week of treatment.  SEEK IMMEDIATE MEDICAL CARE IF:  You develop an increased fever or chills.   You have chest pain.   You have severe shortness of breath.  You have bloody sputum.   You develop dehydration.  You faint or repeatedly feel like you are going to pass out.  You develop repeated vomiting.  You develop a severe headache. MAKE SURE YOU:   Understand these instructions.  Will watch your condition.  Will get help right away if you are not doing well or get worse.   This information is not intended to replace advice given to you by your health care  provider. Make sure you discuss any questions you have with your health care provider.   Document Released: 04/09/2004 Document Revised: 03/23/2014 Document Reviewed: 08/23/2012 Elsevier Interactive Patient Education [employer].

## 2015-05-06 NOTE — ED Notes (Addendum)
Pt to triage via w/c with no distress noted; reports sinus congestion and productive cough; st seen by PCP 3x last week but not feeling better; currently taking levaquin (completed zpak and prednisone); st feeling weak and having SHOB with exertion

## 2015-05-09 DIAGNOSIS — J432 Centrilobular emphysema: Secondary | ICD-10-CM | POA: Diagnosis present

## 2016-03-04 ENCOUNTER — Other Ambulatory Visit: Payer: Self-pay | Admitting: Physical Medicine and Rehabilitation

## 2016-03-04 DIAGNOSIS — M5414 Radiculopathy, thoracic region: Secondary | ICD-10-CM

## 2016-03-04 DIAGNOSIS — M5416 Radiculopathy, lumbar region: Secondary | ICD-10-CM

## 2016-03-19 ENCOUNTER — Ambulatory Visit
Admission: RE | Admit: 2016-03-19 | Discharge: 2016-03-19 | Disposition: A | Discharge status: Home / Self Care | Payer: Medicare Other | Source: Ambulatory Visit | Attending: Physical Medicine and Rehabilitation | Admitting: Physical Medicine and Rehabilitation

## 2016-03-19 DIAGNOSIS — M4854XA Collapsed vertebra, not elsewhere classified, thoracic region, initial encounter for fracture: Secondary | ICD-10-CM | POA: Diagnosis not present

## 2016-03-19 DIAGNOSIS — M1288 Other specific arthropathies, not elsewhere classified, other specified site: Secondary | ICD-10-CM | POA: Insufficient documentation

## 2016-03-19 DIAGNOSIS — M5416 Radiculopathy, lumbar region: Secondary | ICD-10-CM | POA: Insufficient documentation

## 2016-03-19 DIAGNOSIS — M5414 Radiculopathy, thoracic region: Secondary | ICD-10-CM

## 2016-08-06 ENCOUNTER — Encounter: Payer: Self-pay | Admitting: *Deleted

## 2016-08-11 NOTE — Discharge Instructions (Signed)
Cataract Surgery, Care After °Refer to this sheet in the next few weeks. These instructions provide you with information about caring for yourself after your procedure. Your health care provider may also give you more specific instructions. Your treatment has been planned according to current medical practices, but problems sometimes occur. Call your health care provider if you have any problems or questions after your procedure. °What can I expect after the procedure? °After the procedure, it is common to have: °· Itching. °· Discomfort. °· Fluid discharge. °· Sensitivity to light and to touch. °· Bruising. °Follow these instructions at home: °Eye Care  °· Check your eye every day for signs of infection. Watch for: °¨ Redness, swelling, or pain. °¨ Fluid, blood, or pus. °¨ Warmth. °¨ Bad smell. °Activity  °· Avoid strenuous activities, such as playing contact sports, for as long as told by your health care provider. °· Do not drive or operate heavy machinery until your health care provider approves. °· Do not bend or lift heavy objects . Bending increases pressure in the eye. You can walk, climb stairs, and do light household chores. °· Ask your health care provider when you can return to work. If you work in a dusty environment, you may be advised to wear protective eyewear for a period of time. °General instructions  °· Take or apply over-the-counter and prescription medicines only as told by your health care provider. This includes eye drops. °· Do not touch or rub your eyes. °· If you were given a protective shield, wear it as told by your health care provider. If you were not given a protective shield, wear sunglasses as told by your health care provider to protect your eyes. °· Keep the area around your eye clean and dry. Avoid swimming or allowing water to hit you directly in the face while showering until told by your health care provider. Keep soap and shampoo out of your eyes. °· Do not put a contact lens  into the affected eye or eyes until your health care provider approves. °· Keep all follow-up visits as told by your health care provider. This is important. °Contact a health care provider if: ° °· You have increased bruising around your eye. °· You have pain that is not helped with medicine. °· You have a fever. °· You have redness, swelling, or pain in your eye. °· You have fluid, blood, or pus coming from your incision. °· Your vision gets worse. °Get help right away if: °· You have sudden vision loss. °This information is not intended to replace advice given to you by your health care provider. Make sure you discuss any questions you have with your health care provider. °Document Released: 09/19/2004 Document Revised: 07/11/2015 Document Reviewed: 01/10/2015 °Elsevier Interactive Patient Education © 2017 Elsevier Inc. ° ° ° ° °General Anesthesia, Adult, Care After °These instructions provide you with information about caring for yourself after your procedure. Your health care provider may also give you more specific instructions. Your treatment has been planned according to current medical practices, but problems sometimes occur. Call your health care provider if you have any problems or questions after your procedure. °What can I expect after the procedure? °After the procedure, it is common to have: °· Vomiting. °· A sore throat. °· Mental slowness. °It is common to feel: °· Nauseous. °· Cold or shivery. °· Sleepy. °· Tired. °· Sore or achy, even in parts of your body where you did not have surgery. °Follow these instructions at   home: °For at least 24 hours after the procedure:  °· Do not: °¨ Participate in activities where you could fall or become injured. °¨ Drive. °¨ Use heavy machinery. °¨ Drink alcohol. °¨ Take sleeping pills or medicines that cause drowsiness. °¨ Make important decisions or sign legal documents. °¨ Take care of children on your own. °· Rest. °Eating and drinking  °· If you vomit, drink  water, juice, or soup when you can drink without vomiting. °· Drink enough fluid to keep your urine clear or pale yellow. °· Make sure you have little or no nausea before eating solid foods. °· Follow the diet recommended by your health care provider. °General instructions  °· Have a responsible adult stay with you until you are awake and alert. °· Return to your normal activities as told by your health care provider. Ask your health care provider what activities are safe for you. °· Take over-the-counter and prescription medicines only as told by your health care provider. °· If you smoke, do not smoke without supervision. °· Keep all follow-up visits as told by your health care provider. This is important. °Contact a health care provider if: °· You continue to have nausea or vomiting at home, and medicines are not helpful. °· You cannot drink fluids or start eating again. °· You cannot urinate after 8-12 hours. °· You develop a skin rash. °· You have fever. °· You have increasing redness at the site of your procedure. °Get help right away if: °· You have difficulty breathing. °· You have chest pain. °· You have unexpected bleeding. °· You feel that you are having a life-threatening or urgent problem. °This information is not intended to replace advice given to you by your health care provider. Make sure you discuss any questions you have with your health care provider. °Document Released: 06/08/2000 Document Revised: 08/05/2015 Document Reviewed: 02/14/2015 °Elsevier Interactive Patient Education © 2017 Elsevier Inc. ° °

## 2016-08-12 ENCOUNTER — Ambulatory Visit: Payer: Medicare Other | Admitting: Anesthesiology

## 2016-08-12 ENCOUNTER — Ambulatory Visit
Admission: RE | Admit: 2016-08-12 | Discharge: 2016-08-12 | Disposition: A | Discharge status: Home / Self Care | Payer: Medicare Other | Source: Ambulatory Visit | Attending: Ophthalmology | Admitting: Ophthalmology

## 2016-08-12 ENCOUNTER — Encounter
Admission: RE | Disposition: A | Discharge status: Home / Self Care | Payer: Self-pay | Source: Ambulatory Visit | Attending: Ophthalmology

## 2016-08-12 DIAGNOSIS — K219 Gastro-esophageal reflux disease without esophagitis: Secondary | ICD-10-CM | POA: Insufficient documentation

## 2016-08-12 DIAGNOSIS — Z885 Allergy status to narcotic agent status: Secondary | ICD-10-CM | POA: Insufficient documentation

## 2016-08-12 DIAGNOSIS — R2243 Localized swelling, mass and lump, lower limb, bilateral: Secondary | ICD-10-CM | POA: Insufficient documentation

## 2016-08-12 DIAGNOSIS — Z8711 Personal history of peptic ulcer disease: Secondary | ICD-10-CM | POA: Diagnosis not present

## 2016-08-12 DIAGNOSIS — H2511 Age-related nuclear cataract, right eye: Secondary | ICD-10-CM | POA: Diagnosis not present

## 2016-08-12 DIAGNOSIS — G43909 Migraine, unspecified, not intractable, without status migrainosus: Secondary | ICD-10-CM | POA: Diagnosis not present

## 2016-08-12 DIAGNOSIS — K449 Diaphragmatic hernia without obstruction or gangrene: Secondary | ICD-10-CM | POA: Insufficient documentation

## 2016-08-12 DIAGNOSIS — D649 Anemia, unspecified: Secondary | ICD-10-CM | POA: Diagnosis not present

## 2016-08-12 DIAGNOSIS — M069 Rheumatoid arthritis, unspecified: Secondary | ICD-10-CM | POA: Diagnosis not present

## 2016-08-12 DIAGNOSIS — Z87891 Personal history of nicotine dependence: Secondary | ICD-10-CM | POA: Diagnosis not present

## 2016-08-12 DIAGNOSIS — M810 Age-related osteoporosis without current pathological fracture: Secondary | ICD-10-CM | POA: Diagnosis not present

## 2016-08-12 HISTORY — DX: Dizziness and giddiness: R42

## 2016-08-12 HISTORY — PX: CATARACT EXTRACTION W/PHACO: SHX586

## 2016-08-12 HISTORY — DX: Complete loss of teeth, unspecified cause, unspecified class: K08.109

## 2016-08-12 HISTORY — DX: Personal history of (healed) traumatic fracture: Z87.81

## 2016-08-12 HISTORY — DX: Migraine, unspecified, not intractable, without status migrainosus: G43.909

## 2016-08-12 HISTORY — DX: Motion sickness, initial encounter: T75.3XXA

## 2016-08-12 HISTORY — DX: Anodontia: K00.0

## 2016-08-12 HISTORY — DX: Dyspnea, unspecified: R06.00

## 2016-08-12 HISTORY — DX: Anemia, unspecified: D64.9

## 2016-08-12 SURGERY — PHACOEMULSIFICATION, CATARACT, WITH IOL INSERTION
Anesthesia: Monitor Anesthesia Care | Site: Eye | Laterality: Right | Wound class: Clean

## 2016-08-12 MED ORDER — ARMC OPHTHALMIC DILATING DROPS
1.0000 "application " | OPHTHALMIC | Status: DC | PRN
  Administered 2016-08-12 (×3): 1 via OPHTHALMIC

## 2016-08-12 MED ORDER — ACETAMINOPHEN 160 MG/5ML PO SOLN: 325.0000 mg | ORAL | Status: DC | PRN

## 2016-08-12 MED ORDER — BALANCED SALT IO SOLN
INTRAOCULAR | Status: DC | PRN
  Administered 2016-08-12: 1 mL via INTRAOCULAR

## 2016-08-12 MED ORDER — FENTANYL CITRATE (PF) 100 MCG/2ML IJ SOLN
INTRAMUSCULAR | Status: DC | PRN
  Administered 2016-08-12: 100 ug via INTRAVENOUS

## 2016-08-12 MED ORDER — ACETAMINOPHEN 325 MG PO TABS: 325.0000 mg | ORAL_TABLET | ORAL | Status: DC | PRN

## 2016-08-12 MED ORDER — CEFUROXIME OPHTHALMIC INJECTION 1 MG/0.1 ML
INJECTION | OPHTHALMIC | Status: DC | PRN
  Administered 2016-08-12: .3 mL via INTRACAMERAL

## 2016-08-12 MED ORDER — NA HYALUR & NA CHOND-NA HYALUR 0.4-0.35 ML IO KIT
PACK | INTRAOCULAR | Status: DC | PRN
Start: 2016-08-12 — End: 2016-08-12
  Administered 2016-08-12: 1 mL via INTRAOCULAR

## 2016-08-12 MED ORDER — LACTATED RINGERS IV SOLN: INTRAVENOUS | Status: DC

## 2016-08-12 MED ORDER — EPINEPHRINE PF 1 MG/ML IJ SOLN
INTRAOCULAR | Status: DC | PRN
  Administered 2016-08-12: 48 mL via OPHTHALMIC

## 2016-08-12 MED ORDER — MIDAZOLAM HCL 2 MG/2ML IJ SOLN
INTRAMUSCULAR | Status: DC | PRN
  Administered 2016-08-12: 2 mg via INTRAVENOUS

## 2016-08-12 MED ORDER — BRIMONIDINE TARTRATE-TIMOLOL 0.2-0.5 % OP SOLN
OPHTHALMIC | Status: DC | PRN
  Administered 2016-08-12: 1 [drp] via OPHTHALMIC

## 2016-08-12 MED ORDER — MOXIFLOXACIN HCL 0.5 % OP SOLN
1.0000 [drp] | OPHTHALMIC | Status: DC | PRN
  Administered 2016-08-12 (×3): 1 [drp] via OPHTHALMIC

## 2016-08-12 SURGICAL SUPPLY — 25 items
CANNULA ANT/CHMB 27GA (MISCELLANEOUS) ×3 IMPLANT
CARTRIDGE ABBOTT (MISCELLANEOUS) IMPLANT
GLOVE SURG LX 7.5 STRW (GLOVE) ×2
GLOVE SURG LX STRL 7.5 STRW (GLOVE) ×1 IMPLANT
GLOVE SURG TRIUMPH 8.0 PF LTX (GLOVE) ×3 IMPLANT
GOWN STRL REUS W/ TWL LRG LVL3 (GOWN DISPOSABLE) ×2 IMPLANT
GOWN STRL REUS W/TWL LRG LVL3 (GOWN DISPOSABLE) ×4
LENS IOL TECNIS ITEC 20.5 (Intraocular Lens) ×3 IMPLANT
MARKER SKIN DUAL TIP RULER LAB (MISCELLANEOUS) ×3 IMPLANT
NDL RETROBULBAR .5 NSTRL (NEEDLE) IMPLANT
NEEDLE FILTER BLUNT 18X 1/2SAF (NEEDLE) ×2
NEEDLE FILTER BLUNT 18X1 1/2 (NEEDLE) ×1 IMPLANT
PACK CATARACT BRASINGTON (MISCELLANEOUS) ×3 IMPLANT
PACK EYE AFTER SURG (MISCELLANEOUS) ×3 IMPLANT
PACK OPTHALMIC (MISCELLANEOUS) ×3 IMPLANT
RING MALYGIN 7.0 (MISCELLANEOUS) IMPLANT
SUT ETHILON 10-0 CS-B-6CS-B-6 (SUTURE)
SUT VICRYL  9 0 (SUTURE)
SUT VICRYL 9 0 (SUTURE) IMPLANT
SUTURE EHLN 10-0 CS-B-6CS-B-6 (SUTURE) IMPLANT
SYR 3ML LL SCALE MARK (SYRINGE) ×3 IMPLANT
SYR 5ML LL (SYRINGE) ×3 IMPLANT
SYR TB 1ML LUER SLIP (SYRINGE) ×3 IMPLANT
WATER STERILE IRR 250ML POUR (IV SOLUTION) ×3 IMPLANT
WIPE NON LINTING 3.25X3.25 (MISCELLANEOUS) ×3 IMPLANT

## 2016-08-12 NOTE — Transfer of Care (Signed)
Immediate Anesthesia Transfer of Care Note  Patient: Ashley Mack  Procedure(s) Performed: Procedure(s): CATARACT EXTRACTION PHACO AND INTRAOCULAR LENS PLACEMENT (IOC)  Right (Right)  Patient Location: PACU  Anesthesia Type: MAC  Level of Consciousness: awake, alert  and patient cooperative  Airway and Oxygen Therapy: Patient Spontanous Breathing and Patient connected to supplemental oxygen  Post-op Assessment: Post-op Vital signs reviewed, Patient's Cardiovascular Status Stable, Respiratory Function Stable, Patent Airway and No signs of Nausea or vomiting  Post-op Vital Signs: Reviewed and stable  Complications: No apparent anesthesia complications

## 2016-08-12 NOTE — H&P (Signed)
The History and Physical notes are on paper, have been signed, and are to be scanned. The patient remains stable and unchanged from the H&P.   Previous H&P reviewed, patient examined, and there are no changes.  Ashley Mack 08/12/2016 10:56 AM

## 2016-08-12 NOTE — Anesthesia Procedure Notes (Signed)
Procedure Name: MAC Date/Time: 08/12/2016 11:20 AM Performed by: Janna Arch Pre-anesthesia Checklist: Patient identified, Emergency Drugs available, Patient being monitored and Suction available Patient Re-evaluated:Patient Re-evaluated prior to inductionOxygen Delivery Method: Nasal cannula

## 2016-08-12 NOTE — Anesthesia Preprocedure Evaluation (Signed)
Anesthesia Evaluation  Patient identified by MRN, date of birth, ID band Patient awake    Reviewed: Allergy & Precautions, H&P , NPO status , Patient's Chart, lab work & pertinent test results  Airway Mallampati: II  TM Distance: >3 FB Neck ROM: full    Dental no notable dental hx.    Pulmonary shortness of breath, former smoker,    Pulmonary exam normal        Cardiovascular Normal cardiovascular exam     Neuro/Psych  Headaches,    GI/Hepatic GERD  ,  Endo/Other    Renal/GU      Musculoskeletal   Abdominal   Peds  Hematology   Anesthesia Other Findings   Reproductive/Obstetrics                             Anesthesia Physical Anesthesia Plan  ASA: II  Anesthesia Plan: MAC   Post-op Pain Management:    Induction:   Airway Management Planned:   Additional Equipment:   Intra-op Plan:   Post-operative Plan:   Informed Consent: I have reviewed the patients History and Physical, chart, labs and discussed the procedure including the risks, benefits and alternatives for the proposed anesthesia with the patient or authorized representative who has indicated his/her understanding and acceptance.     Plan Discussed with:   Anesthesia Plan Comments:         Anesthesia Quick Evaluation

## 2016-08-12 NOTE — Op Note (Signed)
LOCATION:  [facility]   PREOPERATIVE DIAGNOSIS:    Nuclear sclerotic cataract right eye. H25.11   POSTOPERATIVE DIAGNOSIS:  Nuclear sclerotic cataract right eye.     PROCEDURE:  Phacoemusification with posterior chamber intraocular lens placement of the right eye   LENS:   Implant Name Type Inv. Item Serial No. Manufacturer Lot No. LRB No. Used  LENS IOL DIOP 20.5 - [id number] Intraocular Lens LENS IOL DIOP 20.5 [id number] AMO   Right 1        ULTRASOUND TIME: 15 % of 0 minutes, 50 seconds.  CDE 7.7   SURGEON:  Wyonia Hough, MD   ANESTHESIA:  Topical with tetracaine drops and 2% Xylocaine jelly, augmented with 1% preservative-free intracameral lidocaine.    COMPLICATIONS:  None.   DESCRIPTION OF PROCEDURE:  The patient was identified in the holding room and transported to the operating room and placed in the supine position under the operating microscope.  The right eye was identified as the operative eye and it was prepped and draped in the usual sterile ophthalmic fashion.   A 1 millimeter clear-corneal paracentesis was made at the 12:00 position.  0.5 ml of preservative-free 1% lidocaine was injected into the anterior chamber. The anterior chamber was filled with Viscoat viscoelastic.  A 2.4 millimeter keratome was used to make a near-clear corneal incision at the 9:00 position.  A curvilinear capsulorrhexis was made with a cystotome and capsulorrhexis forceps.  Balanced salt solution was used to hydrodissect and hydrodelineate the nucleus.   Phacoemulsification was then used in stop and chop fashion to remove the lens nucleus and epinucleus.  The remaining cortex was then removed using the irrigation and aspiration handpiece. Provisc was then placed into the capsular bag to distend it for lens placement.  A lens was then injected into the capsular bag.  The remaining viscoelastic was aspirated.   Wounds were hydrated with balanced salt solution.  The anterior  chamber was inflated to a physiologic pressure with balanced salt solution.  No wound leaks were noted. Cefuroxime 0.1 ml of a 10mg /ml solution was injected into the anterior chamber for a dose of 1 mg of intracameral antibiotic at the completion of the case.   Timolol and Brimonidine drops were applied to the eye.  The patient was taken to the recovery room in stable condition without complications of anesthesia or surgery.   Monda Chastain 08/12/2016, 11:37 AM

## 2016-08-12 NOTE — Anesthesia Postprocedure Evaluation (Signed)
Anesthesia Post Note  Patient: Ashley Mack  Procedure(s) Performed: Procedure(s) (LRB): CATARACT EXTRACTION PHACO AND INTRAOCULAR LENS PLACEMENT ([facility])  Right (Right)  Patient location during evaluation: PACU Anesthesia Type: MAC Level of consciousness: awake and alert and oriented Pain management: satisfactory to patient Vital Signs Assessment: post-procedure vital signs reviewed and stable Respiratory status: spontaneous breathing, nonlabored ventilation and respiratory function stable Cardiovascular status: blood pressure returned to baseline and stable Postop Assessment: Adequate PO intake and No signs of nausea or vomiting Anesthetic complications: no    Raliegh Ip

## 2016-08-13 ENCOUNTER — Encounter: Payer: Self-pay | Admitting: Ophthalmology

## 2016-08-14 ENCOUNTER — Inpatient Hospital Stay: Payer: Medicare Other | Admitting: Oncology

## 2016-08-30 DIAGNOSIS — D649 Anemia, unspecified: Secondary | ICD-10-CM | POA: Insufficient documentation

## 2016-08-30 NOTE — Progress Notes (Signed)
[facility] day:  08/31/2016  Chief Complaint: Ashley Mack is a 71 y.o. female with anemia who is referred in consultation by Dr. Ouida Sills for assessment and management.  HPI:  The patient has rheumatoid arthritis, reflux and history of iron deficiency anemia. Her rheumatoid arthritis has been previously treated with methotrexate, Imuran, leflunomide, and sulfasalazine.  She has been on prednisone 5 mg a day for the past 2 years  She states that she has taken Fe Fum-FePoly-Vit C-Vit B3 (INTEGRA) for years.  She states that has been working well until recently. She states that she has symptoms. When she walks, she becomes short of breath.  Colonoscopy on 03/18/2009. She has had several EGDs with the last on 10/12/2008.  She states that she has had polyps, reflux and a hiatal hernia.  She has an occasional nosebleed.  She states that her diet is "anything'.  She eats meat daily.  She eats green leafy vegetables.  She denies any ice pica.  She is unable to see her stool to assess melena or hematochezia.  CBC was normal on 05/24/2014 with hematocrit 38.2, hemoglobin 13 and MCV 92.9.  CBC on 05/03/2015 revealed a hematocrit of 34, hemoglobin 10.9 and MCV 83.3.  CBC on 07/29/2016 revealed a hematocrit of 27.2, hemoglobin 8.5, MCV 86.6, platelets 485,000, white count 12,200 with an ANC of 10,190.  Differential was normal.  Creatinine was 0.8, calcium 9.1, albumin 4, and protein 6.5.  Liver function tests were normal.  TSH was normal.  She denies a family hstory of any blood disorder.   Past Medical History:  Diagnosis Date  . Anemia   . Arthritis    Rheumatoid  . Dyspnea   . GERD (gastroesophageal reflux disease)   . History of fractured vertebra    sees chiropractor  . Migraine headache    in past  . Motion sickness   . 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 Vertigo     Past Surgical History:  Procedure Laterality Date  . CATARACT EXTRACTION W/PHACO Right  08/12/2016   Procedure: CATARACT EXTRACTION PHACO AND INTRAOCULAR LENS PLACEMENT ([place])  Right;  Surgeon: Leandrew Koyanagi, MD;  Location: [facility];  Service: Ophthalmology;  Laterality: Right;  . OOPHORECTOMY Bilateral 2003    History reviewed. No pertinent family history.  Social History:  reports that she quit smoking about 9 years ago. She has never used smokeless tobacco. She reports that she does not drink alcohol or use drugs.  She quit smoking in 2009.  She lives in [place].  The patient is accompanied by [employer], her step child today.  Allergies:  Allergies  Allergen Reactions  . Hydrocodone-Acetaminophen Nausea And Vomiting    Dizziness  . Meloxicam Nausea And Vomiting  . Other Nausea Only    Arthritis medications   . Oxycodone     Dizzy, foggy feeling  . Promethazine Hcl Other (See Comments)  . Propoxyphene Nausea Only  . Ranitidine Hives  . Sulfasalazine Other (See Comments)    Stomach upset  . Topiramate Nausea Only    Current Medications: Current Outpatient Prescriptions  Medication Sig Dispense Refill  . albuterol (PROVENTIL HFA;VENTOLIN HFA) 108 (90 Base) MCG/ACT inhaler Inhale 2 puffs into the lungs every 6 (six) hours as needed for wheezing or shortness of breath. 1 Inhaler 0  . Budesonide-Formoterol Fumarate (SYMBICORT IN) Inhale into the lungs 2 (two) times daily.    . Cholecalciferol (VITAMIN D3) 2000 UNITS capsule Take 1  capsule by mouth daily.    Marland Kitchen DEXILANT 60 MG capsule Take 1 capsule by mouth daily.    . Fe Fum-FePoly-Vit C-Vit B3 (INTEGRA) 62.5-62.5-40-3 MG CAPS Take 1 capsule by mouth daily.    . fluticasone (FLONASE) 50 MCG/ACT nasal spray Place into both nostrils daily as needed for allergies or rhinitis.    . naproxen sodium (ANAPROX) 220 MG tablet Take 220 mg by mouth as needed.    . predniSONE (DELTASONE) 10 MG tablet Take 1 tablet by mouth daily.     No current facility-administered medications for this visit.     Review of  Systems:  GENERAL:  Feels good.  Active.  No fevers, sweats or weight loss. PERFORMANCE STATUS (ECOG):  1 HEENT:  Poor vision.  Dry throat.  No runny nose, sore throat, mouth sores or tenderness. Lungs: Shortness of breath with walking.  No cough.  No hemoptysis. Cardiac:  No chest pain, palpitations, orthopnea, or PND. GI:  No nausea, vomiting, diarrhea, constipation, melena or hematochezia. GU:  No urgency, frequency, dysuria, or hematuria. Musculoskeletal:  Back pain with 5 disk bulges (sees a chiropractor).  Arthritis.  No muscle tenderness. Extremities:  No pain or swelling. Skin:  No rashes or skin changes. Neuro:  Occasional migraine headache for years.  No numbness or weakness, balance or coordination issues. Endocrine:  No diabetes, thyroid issues, hot flashes or night sweats. Psych:  No mood changes, depression or anxiety. Pain:  No focal pain. Review of systems:  All other systems reviewed and found to be negative.  Physical Exam: Blood pressure (!) 172/83, pulse 87, temperature 97.8 F (36.6 C), temperature source Tympanic, resp. rate 18, height _0  (1.575 m), weight 153 lb (69.4 kg). GENERAL:  Well developed, well nourished, sitting comfortably in the exam room in no acute distress. MENTAL STATUS:  Alert and oriented to person, place and time. HEAD:  Short brown hair.  Normocephalic, atraumatic, face symmetric, no Cushingoid features. EYES:  Glasses.  Blue eyes.  Recent right cataract surgery.  Pupils equal round and reactive to light and accomodation.  No conjunctivitis or scleral icterus. ENT:  Oropharynx clear without lesion.  Tongue normal.  No teeth.  Mucous membranes moist.  RESPIRATORY:  Clear to auscultation without rales, wheezes or rhonchi. CARDIOVASCULAR:  Regular rate and rhythm without murmur, rub or gallop. ABDOMEN:  Soft, non-tender, with active bowel sounds, and no hepatosplenomegaly.  No masses. SKIN:  Few bruises.  No rashes, ulcers or  lesions. EXTREMITIES: No edema, no skin discoloration or tenderness.  No palpable cords. LYMPH NODES: No palpable cervical, supraclavicular, axillary or inguinal adenopathy  NEUROLOGICAL: Unremarkable. PSYCH:  Appropriate.   No visits with results within 3 Day(s) from this visit.  Latest known visit with results is:  Admission on 05/06/2015, Discharged on 05/06/2015  Component Date Value Ref Range Status  . Sodium 05/06/2015 131* 135 - 145 mmol/L Final  . Potassium 05/06/2015 3.3* 3.5 - 5.1 mmol/L Final  . Chloride 05/06/2015 93* 101 - 111 mmol/L Final  . CO2 05/06/2015 31  22 - 32 mmol/L Final  . Glucose, Bld 05/06/2015 133* 65 - 99 mg/dL Final  . BUN 05/06/2015 8  6 - 20 mg/dL Final  . Creatinine, Ser 05/06/2015 0.67  0.44 - 1.00 mg/dL Final  . Calcium 05/06/2015 8.0* 8.9 - 10.3 mg/dL Final  . GFR calc non Af Amer 05/06/2015 >60  >60 mL/min Final  . GFR calc Af Amer 05/06/2015 >60  >60 mL/min Final   Comment: (  NOTE) The eGFR has been calculated using the CKD EPI equation. This calculation has not been validated in all clinical situations. eGFR's persistently <60 mL/min signify possible Chronic Kidney Disease.   . Anion gap 05/06/2015 7  5 - 15 Final  . WBC 05/06/2015 10.9  3.6 - 11.0 K/uL Final  . RBC 05/06/2015 4.30  3.80 - 5.20 MIL/uL Final  . Hemoglobin 05/06/2015 11.4* 12.0 - 16.0 g/dL Final  . HCT 05/06/2015 34.7* 35.0 - 47.0 % Final  . MCV 05/06/2015 80.9  80.0 - 100.0 fL Final  . MCH 05/06/2015 26.6  26.0 - 34.0 pg Final  . MCHC 05/06/2015 32.9  32.0 - 36.0 g/dL Final  . RDW 05/06/2015 16.7* 11.5 - 14.5 % Final  . Platelets 05/06/2015 536* 150 - 440 K/uL Final  . Troponin I 05/06/2015 <0.03  <0.031 ng/mL Final   Comment:        NO INDICATION OF MYOCARDIAL INJURY.     Assessment:  SARINITY DICICCO is a 71 y.o. female with a new normocytic anemia.  Diet is good.  She has been on oral iron (Integra) for years.  She is unsure of any melena or hematochezia as she can not  see well.    She had a colonoscopy on 03/18/2009. She has had several EGDs with the last on 10/12/2008.  She states that she has had polyps, reflux and a hiatal hernia.  CBC on 07/29/2016 revealed a hematocrit of 27.2, hemoglobin 8.5, MCV 86.6, platelets 485,000, white count 12,200 with an ANC of 10,190.  Normal studies included: creatinine, calcium, albumin, protein, liver function tests, and TSH.  She has rheumatoid arthritis.  She has been treated with methotrexate, Imuran, leflunomide, and sulfasalazine.  She is on prednisone 5 mg a day x 2 years.  Symptomatically, she is short of breath with exertion.  Exam is stable.  Plan: 1.  Discuss differential diagnosis of anemia.  RBCs are normocytic.  Discuss baseline labs. 2.  Labs today:  CBC with diff, retic, ferritin, iron studies, B12, folate, sed rate, SPEP, free light chains, [place]. 3.  Obtain copies of endoscopies.  May need GI evaluation. 4.  RTC on 09/25/2016 for MD assessment and discussion regarding direction of therapy.  Addendum:  The patient was contacted regarding low B12 (90) and low ferritin(13).  She will be started on B12 weekly x 6 then monthly.  She will begin weekly Venofer.   Lequita Asal, MD  08/31/2016, 11:55 AM

## 2016-08-31 ENCOUNTER — Inpatient Hospital Stay: Payer: Medicare Other

## 2016-08-31 ENCOUNTER — Other Ambulatory Visit: Payer: Self-pay | Admitting: Hematology and Oncology

## 2016-08-31 ENCOUNTER — Encounter: Payer: Self-pay | Admitting: Hematology and Oncology

## 2016-08-31 ENCOUNTER — Inpatient Hospital Stay: Payer: Medicare Other | Attending: Oncology | Admitting: Hematology and Oncology

## 2016-08-31 VITALS — BP 172/83 | HR 87 | Temp 97.8°F | Resp 18 | Ht 62.0 in | Wt 153.0 lb

## 2016-08-31 DIAGNOSIS — D649 Anemia, unspecified: Secondary | ICD-10-CM | POA: Diagnosis present

## 2016-08-31 DIAGNOSIS — K219 Gastro-esophageal reflux disease without esophagitis: Secondary | ICD-10-CM | POA: Insufficient documentation

## 2016-08-31 DIAGNOSIS — Z79899 Other long term (current) drug therapy: Secondary | ICD-10-CM | POA: Diagnosis not present

## 2016-08-31 DIAGNOSIS — M069 Rheumatoid arthritis, unspecified: Secondary | ICD-10-CM | POA: Diagnosis not present

## 2016-08-31 DIAGNOSIS — Z87891 Personal history of nicotine dependence: Secondary | ICD-10-CM | POA: Insufficient documentation

## 2016-08-31 DIAGNOSIS — Z9841 Cataract extraction status, right eye: Secondary | ICD-10-CM | POA: Diagnosis not present

## 2016-08-31 DIAGNOSIS — K449 Diaphragmatic hernia without obstruction or gangrene: Secondary | ICD-10-CM | POA: Insufficient documentation

## 2016-08-31 DIAGNOSIS — E538 Deficiency of other specified B group vitamins: Secondary | ICD-10-CM | POA: Insufficient documentation

## 2016-08-31 LAB — VITAMIN B12: Vitamin B-12: 90 pg/mL — ABNORMAL LOW (ref 180–914)

## 2016-08-31 LAB — IRON AND TIBC
Iron: 120 ug/dL (ref 28–170)
Saturation Ratios: 29 % (ref 10.4–31.8)
TIBC: 417 ug/dL (ref 250–450)
UIBC: 297 ug/dL

## 2016-08-31 LAB — CBC WITH DIFFERENTIAL/PLATELET
Basophils Absolute: 0.2 10*3/uL — ABNORMAL HIGH (ref 0–0.1)
Basophils Relative: 1 %
Eosinophils Absolute: 0 10*3/uL (ref 0–0.7)
Eosinophils Relative: 0 %
HCT: 30.6 % — ABNORMAL LOW (ref 35.0–47.0)
Hemoglobin: 9.9 g/dL — ABNORMAL LOW (ref 12.0–16.0)
Lymphocytes Relative: 9 %
Lymphs Abs: 1.3 10*3/uL (ref 1.0–3.6)
MCH: 27 pg (ref 26.0–34.0)
MCHC: 32.5 g/dL (ref 32.0–36.0)
MCV: 83.2 fL (ref 80.0–100.0)
Monocytes Absolute: 0.4 10*3/uL (ref 0.2–0.9)
Monocytes Relative: 3 %
Neutro Abs: 12.3 10*3/uL — ABNORMAL HIGH (ref 1.4–6.5)
Neutrophils Relative %: 87 %
Platelets: 509 10*3/uL — ABNORMAL HIGH (ref 150–440)
RBC: 3.67 MIL/uL — ABNORMAL LOW (ref 3.80–5.20)
RDW: 18.5 % — ABNORMAL HIGH (ref 11.5–14.5)
WBC: 14.2 10*3/uL — ABNORMAL HIGH (ref 3.6–11.0)

## 2016-08-31 LAB — DAT, POLYSPECIFIC AHG (ARMC ONLY): Polyspecific AHG test: NEGATIVE

## 2016-08-31 LAB — RETICULOCYTES
RBC.: 3.65 MIL/uL — ABNORMAL LOW (ref 3.80–5.20)
Retic Count, Absolute: 94.9 10*3/uL (ref 19.0–183.0)
Retic Ct Pct: 2.6 % (ref 0.4–3.1)

## 2016-08-31 LAB — FERRITIN: Ferritin: 13 ng/mL (ref 11–307)

## 2016-08-31 LAB — SEDIMENTATION RATE: Sed Rate: 45 mm/hr — ABNORMAL HIGH (ref 0–30)

## 2016-08-31 LAB — FOLATE: Folate: 18.7 ng/mL (ref >5.9)

## 2016-08-31 NOTE — Progress Notes (Signed)
Patient here today as new evaluation regarding anemia.  Referred by Dr. Ouida Sills.

## 2016-09-01 ENCOUNTER — Other Ambulatory Visit: Payer: Self-pay | Admitting: Hematology and Oncology

## 2016-09-01 ENCOUNTER — Encounter: Payer: Self-pay | Admitting: Hematology and Oncology

## 2016-09-01 ENCOUNTER — Telehealth: Payer: Self-pay | Admitting: *Deleted

## 2016-09-01 DIAGNOSIS — D509 Iron deficiency anemia, unspecified: Secondary | ICD-10-CM | POA: Insufficient documentation

## 2016-09-01 LAB — KAPPA/LAMBDA LIGHT CHAINS
Kappa free light chain: 21.4 mg/L — ABNORMAL HIGH (ref 3.3–19.4)
Kappa, lambda light chain ratio: 0.98 (ref 0.26–1.65)
Lambda free light chains: 21.8 mg/L (ref 5.7–26.3)

## 2016-09-01 LAB — PROTEIN ELECTROPHORESIS, SERUM
A/G Ratio: 1.2 (ref 0.7–1.7)
Albumin ELP: 3.8 g/dL (ref 2.9–4.4)
Alpha-1-Globulin: 0.3 g/dL (ref 0.0–0.4)
Alpha-2-Globulin: 0.9 g/dL (ref 0.4–1.0)
Beta Globulin: 1.1 g/dL (ref 0.7–1.3)
Gamma Globulin: 0.9 g/dL (ref 0.4–1.8)
Globulin, Total: 3.2 g/dL (ref 2.2–3.9)
Total Protein ELP: 7 g/dL (ref 6.0–8.5)

## 2016-09-01 NOTE — Telephone Encounter (Signed)
Patient informed that her b12 and ferritin was low and that we would be setting her up appts to receive b12 and iv iron, voiced understanding.

## 2016-09-08 ENCOUNTER — Inpatient Hospital Stay: Payer: Medicare Other

## 2016-09-08 ENCOUNTER — Ambulatory Visit: Payer: Medicare Other | Admitting: Oncology

## 2016-09-08 VITALS — BP 162/87 | HR 89 | Temp 97.5°F | Resp 18

## 2016-09-08 DIAGNOSIS — E538 Deficiency of other specified B group vitamins: Secondary | ICD-10-CM

## 2016-09-08 DIAGNOSIS — D649 Anemia, unspecified: Secondary | ICD-10-CM | POA: Diagnosis not present

## 2016-09-08 MED ORDER — CYANOCOBALAMIN 1000 MCG/ML IJ SOLN
1000.0000 ug | Freq: Once | INTRAMUSCULAR | Status: AC
  Administered 2016-09-08: 1000 ug via INTRAMUSCULAR
  Filled 2016-09-08: qty 1

## 2016-09-08 MED ORDER — IRON SUCROSE 20 MG/ML IV SOLN: 200.0000 mg | Freq: Once | INTRAVENOUS | Status: DC

## 2016-09-08 MED ORDER — SODIUM CHLORIDE 0.9 % IV SOLN
Freq: Once | INTRAVENOUS | Status: AC
  Administered 2016-09-08: 14:00:00 via INTRAVENOUS
  Filled 2016-09-08: qty 1000

## 2016-09-08 MED ORDER — IRON SUCROSE 20 MG/ML IV SOLN
200.0000 mg | Freq: Once | INTRAVENOUS | Status: AC
  Administered 2016-09-08: 200 mg via INTRAVENOUS
  Filled 2016-09-08: qty 10

## 2016-09-15 ENCOUNTER — Inpatient Hospital Stay: Payer: Medicare Other | Attending: Oncology

## 2016-09-15 ENCOUNTER — Inpatient Hospital Stay: Payer: Medicare Other

## 2016-09-15 VITALS — BP 146/83 | HR 88 | Temp 97.2°F | Resp 18

## 2016-09-15 DIAGNOSIS — Z9841 Cataract extraction status, right eye: Secondary | ICD-10-CM | POA: Diagnosis not present

## 2016-09-15 DIAGNOSIS — Z79899 Other long term (current) drug therapy: Secondary | ICD-10-CM | POA: Insufficient documentation

## 2016-09-15 DIAGNOSIS — K449 Diaphragmatic hernia without obstruction or gangrene: Secondary | ICD-10-CM | POA: Diagnosis not present

## 2016-09-15 DIAGNOSIS — M069 Rheumatoid arthritis, unspecified: Secondary | ICD-10-CM | POA: Diagnosis not present

## 2016-09-15 DIAGNOSIS — Z87891 Personal history of nicotine dependence: Secondary | ICD-10-CM | POA: Insufficient documentation

## 2016-09-15 DIAGNOSIS — K219 Gastro-esophageal reflux disease without esophagitis: Secondary | ICD-10-CM | POA: Insufficient documentation

## 2016-09-15 DIAGNOSIS — E538 Deficiency of other specified B group vitamins: Secondary | ICD-10-CM | POA: Diagnosis not present

## 2016-09-15 DIAGNOSIS — D509 Iron deficiency anemia, unspecified: Secondary | ICD-10-CM | POA: Insufficient documentation

## 2016-09-15 MED ORDER — SODIUM CHLORIDE 0.9 % IV SOLN
Freq: Once | INTRAVENOUS | Status: AC
  Administered 2016-09-15: 15:00:00 via INTRAVENOUS
  Filled 2016-09-15: qty 1000

## 2016-09-15 MED ORDER — IRON SUCROSE 20 MG/ML IV SOLN: 200.0000 mg | Freq: Once | INTRAVENOUS | Status: DC

## 2016-09-15 MED ORDER — IRON SUCROSE 20 MG/ML IV SOLN
200.0000 mg | Freq: Once | INTRAVENOUS | Status: AC
  Administered 2016-09-15: 200 mg via INTRAVENOUS
  Filled 2016-09-15: qty 10

## 2016-09-15 MED ORDER — CYANOCOBALAMIN 1000 MCG/ML IJ SOLN
1000.0000 ug | Freq: Once | INTRAMUSCULAR | Status: AC
  Administered 2016-09-15: 1000 ug via INTRAMUSCULAR

## 2016-09-22 ENCOUNTER — Inpatient Hospital Stay: Payer: Medicare Other

## 2016-09-24 ENCOUNTER — Encounter: Payer: Self-pay | Admitting: *Deleted

## 2016-09-25 ENCOUNTER — Inpatient Hospital Stay: Payer: Medicare Other

## 2016-09-25 ENCOUNTER — Encounter: Payer: Self-pay | Admitting: Hematology and Oncology

## 2016-09-25 ENCOUNTER — Inpatient Hospital Stay (HOSPITAL_BASED_OUTPATIENT_CLINIC_OR_DEPARTMENT_OTHER): Payer: Medicare Other | Admitting: Hematology and Oncology

## 2016-09-25 VITALS — BP 170/72 | HR 89 | Temp 98.3°F | Resp 20 | Wt 154.1 lb

## 2016-09-25 DIAGNOSIS — E538 Deficiency of other specified B group vitamins: Secondary | ICD-10-CM | POA: Diagnosis not present

## 2016-09-25 DIAGNOSIS — M069 Rheumatoid arthritis, unspecified: Secondary | ICD-10-CM | POA: Diagnosis not present

## 2016-09-25 DIAGNOSIS — D509 Iron deficiency anemia, unspecified: Secondary | ICD-10-CM | POA: Diagnosis not present

## 2016-09-25 DIAGNOSIS — Z79899 Other long term (current) drug therapy: Secondary | ICD-10-CM | POA: Diagnosis not present

## 2016-09-25 LAB — CBC WITH DIFFERENTIAL/PLATELET
Basophils Absolute: 0.1 10*3/uL (ref 0–0.1)
Basophils Relative: 1 %
Eosinophils Absolute: 0 10*3/uL (ref 0–0.7)
Eosinophils Relative: 0 %
HCT: 29.2 % — ABNORMAL LOW (ref 35.0–47.0)
Hemoglobin: 9.7 g/dL — ABNORMAL LOW (ref 12.0–16.0)
Lymphocytes Relative: 16 %
Lymphs Abs: 1.7 10*3/uL (ref 1.0–3.6)
MCH: 28.1 pg (ref 26.0–34.0)
MCHC: 33.2 g/dL (ref 32.0–36.0)
MCV: 84.6 fL (ref 80.0–100.0)
Monocytes Absolute: 0.5 10*3/uL (ref 0.2–0.9)
Monocytes Relative: 5 %
Neutro Abs: 8.2 10*3/uL — ABNORMAL HIGH (ref 1.4–6.5)
Neutrophils Relative %: 78 %
Platelets: 478 10*3/uL — ABNORMAL HIGH (ref 150–440)
RBC: 3.46 MIL/uL — ABNORMAL LOW (ref 3.80–5.20)
RDW: 18.3 % — ABNORMAL HIGH (ref 11.5–14.5)
WBC: 10.6 10*3/uL (ref 3.6–11.0)

## 2016-09-25 LAB — FERRITIN: Ferritin: 66 ng/mL (ref 11–307)

## 2016-09-25 MED ORDER — IRON SUCROSE 20 MG/ML IV SOLN
200.0000 mg | Freq: Once | INTRAVENOUS | Status: AC
  Administered 2016-09-25: 200 mg via INTRAVENOUS
  Filled 2016-09-25: qty 10

## 2016-09-25 MED ORDER — SODIUM CHLORIDE 0.9 % IV SOLN
Freq: Once | INTRAVENOUS | Status: AC
  Administered 2016-09-25: 15:00:00 via INTRAVENOUS
  Filled 2016-09-25: qty 1000

## 2016-09-25 MED ORDER — SODIUM CHLORIDE 0.9 % IV SOLN: 200.0000 mg | Freq: Once | INTRAVENOUS | Status: DC

## 2016-09-25 MED ORDER — CYANOCOBALAMIN 1000 MCG/ML IJ SOLN
1000.0000 ug | Freq: Once | INTRAMUSCULAR | Status: AC
  Administered 2016-09-25: 1000 ug via INTRAMUSCULAR
  Filled 2016-09-25: qty 1

## 2016-09-25 NOTE — Progress Notes (Signed)
Patient continues to feel tired all the time.

## 2016-09-25 NOTE — Progress Notes (Signed)
[facility] day:  09/25/2016  Chief Complaint: ZAMYIAH TINO is a 71 y.o. female with iron deficiency anemia and B12 deficiency who is seen for review of work-up and assessment after initiation of B12 and IV iron.  HPI:  The patient was last seen in the hematology clinic on 08/31/2016.  At that time, she was seen for initial consultation.  She had a new normocytic anemia.  Diet appeared good.  She had been on oral iron (Integra) for years.  She was unsure of any melena or hematochezia as she can not see well.  EGD in 2010 and colonoscopy in 2011 revealed no abnormalities.  She was felt likely to have some component of anemia of chronic disease secondary to her rheumatoid arthritis.  She underwent a work-up.  CBC revealed a hematocrit 30.6, hemoglobin 9.9, MCV 83.2, platelets 509,000, white count for 14,200 with an ANC of 12,300.  Coombs was negative. Reticulocyte 2.6%.  B12 was 90.  Folate was 18.7. Ferritin was 13. Iron studies included a saturation of 29% and a TIBC of 417. SPEP revealed no monoclonal protein. Kappa free light chains were 21.4, lambda free light chains 21.8 with a ratio of 0.98 (normal). Sedimentation rate was 45.  She was contacted regarding her iron deficiency and B12 deficiency.  She began weekly B12 on 09/08/2016 (last 09/15/2016).  She received Venofer on 09/08/2016 and 09/15/2016.  Symptomatically, she notes shortness of breath with exertion. She is still tired. She notes that she doesn't "breathe as heavy".  She has cataract surgery next Wednesday.   Past Medical History:  Diagnosis Date  . Anemia   . Arthritis    Rheumatoid  . Dyspnea   . GERD (gastroesophageal reflux disease)   . History of fractured vertebra    sees chiropractor  . Migraine headache    in past  . Motion sickness   . 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 Vertigo     Past Surgical History:  Procedure Laterality Date  . CATARACT EXTRACTION W/PHACO Right 08/12/2016    Procedure: CATARACT EXTRACTION PHACO AND INTRAOCULAR LENS PLACEMENT ([place])  Right;  Surgeon: Leandrew Koyanagi, MD;  Location: [facility];  Service: Ophthalmology;  Laterality: Right;  . OOPHORECTOMY Bilateral 2003    History reviewed. No pertinent family history.  Social History:  reports that she quit smoking about 9 years ago. She has never used smokeless tobacco. She reports that she does not drink alcohol or use drugs.  She quit smoking in 2009.  She lives in [place].  The patient is accompanied by [employer], her step daughter, today.  Allergies:  Allergies  Allergen Reactions  . Hydrocodone-Acetaminophen Nausea And Vomiting    Dizziness  . Meloxicam Nausea And Vomiting  . Other Nausea Only    Arthritis medications   . Oxycodone     Dizzy, foggy feeling  . Promethazine Hcl Other (See Comments)  . Propoxyphene Nausea Only  . Ranitidine Hives  . Sulfasalazine Other (See Comments)    Stomach upset  . Topiramate Nausea Only    Current Medications: Current Outpatient Prescriptions  Medication Sig Dispense Refill  . Budesonide-Formoterol Fumarate (SYMBICORT IN) Inhale into the lungs 2 (two) times daily.    . Cholecalciferol (VITAMIN D3) 2000 UNITS capsule Take 1 capsule by mouth daily.    . Cyanocobalamin (VITAMIN B-12 IJ) Inject as directed every 30 (thirty) days.    Marland Kitchen DEXILANT 60 MG capsule Take 1 capsule by mouth daily.    Marland Kitchen  Fe Fum-FePoly-Vit C-Vit B3 (INTEGRA) 62.5-62.5-40-3 MG CAPS Take 1 capsule by mouth daily.    . fluticasone (FLONASE) 50 MCG/ACT nasal spray Place into both nostrils daily as needed for allergies or rhinitis.    . naproxen sodium (ANAPROX) 220 MG tablet Take 220 mg by mouth as needed.    . predniSONE (DELTASONE) 10 MG tablet Take 1 tablet by mouth daily.     No current facility-administered medications for this visit.     Review of Systems:  GENERAL:  Still tired.  No fevers or sweats.  Weight loss of 1 pound. PERFORMANCE STATUS (ECOG):   1 HEENT:  Poor vision.  Cataract surgery next Wednesday.  No runny nose, sore throat, mouth sores or tenderness. Lungs: Shortness of breath, improved.  No cough.  No hemoptysis. Cardiac:  No chest pain, palpitations, orthopnea, or PND. GI:  No nausea, vomiting, diarrhea, constipation, melena or hematochezia. GU:  No urgency, frequency, dysuria, or hematuria. Musculoskeletal:  Back pain with 5 disk bulges (sees a chiropractor).  Arthritis.  No muscle tenderness. Extremities:  No pain or swelling. Skin:  No rashes or skin changes. Neuro:  Occasional migraine headache for years.  No numbness or weakness, balance or coordination issues. Endocrine:  No diabetes, thyroid issues, hot flashes or night sweats. Psych:  No mood changes, depression or anxiety. Pain:  No focal pain. Review of systems:  All other systems reviewed and found to be negative.  Physical Exam: Blood pressure (!) 170/72, pulse 89, temperature 98.3 F (36.8 C), temperature source Tympanic, resp. rate 20, weight 154 lb 2 oz (69.9 kg). GENERAL:  Well developed, well nourished, woman sitting comfortably in the exam room in no acute distress. MENTAL STATUS:  Alert and oriented to person, place and time. HEAD:  Short brown hair.  Normocephalic, atraumatic, face symmetric, no Cushingoid features. EYES:  Glasses.  Blue eyes.  s/p right cataract surgery.  Pupils equal round and reactive to light and accomodation.  No conjunctivitis or scleral icterus. ENT:  Oropharynx clear without lesion.  Tongue normal.  No teeth.  Mucous membranes moist.  RESPIRATORY:  Clear to auscultation without rales, wheezes or rhonchi. CARDIOVASCULAR:  Regular rate and rhythm without murmur, rub or gallop. ABDOMEN:  Soft, non-tender, with active bowel sounds, and no hepatosplenomegaly.  No masses. SKIN:  No rashes, ulcers or lesions. EXTREMITIES: No edema, no skin discoloration or tenderness.  No palpable cords. LYMPH NODES: No palpable cervical,  supraclavicular, axillary or inguinal adenopathy  NEUROLOGICAL: Unremarkable. PSYCH:  Appropriate.   No visits with results within 3 Day(s) from this visit.  Latest known visit with results is:  Office Visit on 08/31/2016  Component Date Value Ref Range Status  . WBC 08/31/2016 14.2* 3.6 - 11.0 K/uL Final  . RBC 08/31/2016 3.67* 3.80 - 5.20 MIL/uL Final  . Hemoglobin 08/31/2016 9.9* 12.0 - 16.0 g/dL Final  . HCT 08/31/2016 30.6* 35.0 - 47.0 % Final  . MCV 08/31/2016 83.2  80.0 - 100.0 fL Final  . MCH 08/31/2016 27.0  26.0 - 34.0 pg Final  . MCHC 08/31/2016 32.5  32.0 - 36.0 g/dL Final  . RDW 08/31/2016 18.5* 11.5 - 14.5 % Final  . Platelets 08/31/2016 509* 150 - 440 K/uL Final  . Neutrophils Relative % 08/31/2016 87  % Final  . Neutro Abs 08/31/2016 12.3* 1.4 - 6.5 K/uL Final  . Lymphocytes Relative 08/31/2016 9  % Final  . Lymphs Abs 08/31/2016 1.3  1.0 - 3.6 K/uL Final  . Monocytes Relative  08/31/2016 3  % Final  . Monocytes Absolute 08/31/2016 0.4  0.2 - 0.9 K/uL Final  . Eosinophils Relative 08/31/2016 0  % Final  . Eosinophils Absolute 08/31/2016 0.0  0 - 0.7 K/uL Final  . Basophils Relative 08/31/2016 1  % Final  . Basophils Absolute 08/31/2016 0.2* 0 - 0.1 K/uL Final  . Retic Ct Pct 08/31/2016 2.6  0.4 - 3.1 % Final  . RBC. 08/31/2016 3.65* 3.80 - 5.20 MIL/uL Final  . Retic Count, Absolute 08/31/2016 94.9  19.0 - 183.0 K/uL Final  . Polyspecific AHG test 08/31/2016 NEG   Final  . Ferritin 08/31/2016 13  11 - 307 ng/mL Final  . Iron 08/31/2016 120  28 - 170 ug/dL Final  . TIBC 08/31/2016 417  250 - 450 ug/dL Final  . Saturation Ratios 08/31/2016 29  10.4 - 31.8 % Final  . UIBC 08/31/2016 297  ug/dL Final  . Vitamin B-12 08/31/2016 90* 180 - 914 pg/mL Final   Comment: (NOTE) This assay is not validated for testing neonatal or myeloproliferative syndrome specimens for Vitamin B12 levels. Performed at [facility], [place] [address], [place], [place] [address]   .  Folate 08/31/2016 18.7  >5.9 ng/mL Final  . Total Protein ELP 08/31/2016 7.0  6.0 - 8.5 g/dL Final  . Albumin ELP 08/31/2016 3.8  2.9 - 4.4 g/dL Final  . Alpha-1-Globulin 08/31/2016 0.3  0.0 - 0.4 g/dL Final  . Alpha-2-Globulin 08/31/2016 0.9  0.4 - 1.0 g/dL Final  . Beta Globulin 08/31/2016 1.1  0.7 - 1.3 g/dL Final  . Gamma Globulin 08/31/2016 0.9  0.4 - 1.8 g/dL Final  . M-Spike, % 08/31/2016 Not Observed  Not Observed g/dL Final  . SPE Interp. 08/31/2016 Comment   Final   Comment: (NOTE) The SPE pattern appears essentially unremarkable. Evidence of monoclonal protein is not apparent. Performed At: [facility] [place], [place] [id number] Lindon Romp MD ER:[id number]   . Comment 08/31/2016 Comment   Final   Comment: (NOTE) Protein electrophoresis scan will follow via computer, mail, or courier delivery.   Marland Kitchen GLOBULIN, TOTAL 08/31/2016 3.2  2.2 - 3.9 g/dL Corrected  . A/G Ratio 08/31/2016 1.2  0.7 - 1.7 Corrected  . Kappa free light chain 08/31/2016 21.4* 3.3 - 19.4 mg/L Final  . Lamda free light chains 08/31/2016 21.8  5.7 - 26.3 mg/L Final  . Kappa, lamda light chain ratio 08/31/2016 0.98  0.26 - 1.65 Final   Comment: (NOTE) Performed At: [facility] [place], [place] [id number] Lindon Romp MD IW:[id number]   . Sed Rate 08/31/2016 45* 0 - 30 mm/hr Final    Assessment:  CHESTER ROMERO is a 71 y.o. female with iron deficiency anemia and B12 deficiency.  Diet is good.  She was on oral iron (Integra) for years.  She is unsure of any melena or hematochezia as she can not see well.    Work-up on 08/31/2016 revealed a hematocrit 30.6, hemoglobin 9.9, and MCV 83.2.  B12 was 90.  Ferritin was 13.  Sed rate was 45 (elevated).  Retic was 2.6%.  Normal studies included the following:  Coombs, folate, iron saturation, TIBC, SPEP, and free light chain ratio.  Normal studies on 07/29/2016 included: creatinine, calcium, albumin, protein,  liver function tests, and TSH.  She had a colonoscopy on 03/18/2009. She has had several EGDs with the last on 10/12/2008.  She states that she has had polyps, reflux and  a hiatal hernia.  She began weekly B12 on 09/08/2016 (last 09/15/2016).  She received Venofer on 09/08/2016 and 09/15/2016.  She has rheumatoid arthritis.  She has been treated with methotrexate, Imuran, leflunomide, and sulfasalazine.  She is on prednisone 5 mg a day x 2 years.  Symptomatically, she remains fatigued.  She is less short of breath.  Exam is stable.  Hematocrit is 29.2 and hemoglobin 9.7.  Ferritin is 66.  Plan: 1.  Labs today: CBC with diff, ferritin. 2.  Discuss work-up and diagnosis of iron deficiency anemia and B12 deficiency. 3.  Guaiac cards x 3. 4.  Venofer today. 5.  Week #3 B12 today. 6.  RTC weekly x 3 on Fridays for B12 then monthly. 7.  With last weekly B12- check CBC with diff. 8.  RTC for MD assessment with first monthly B12 injection.   Labs that day- CBC with diff, ferritin.  Addendum:  Two of 3 guaiac cards positive.  Patient contacted.  Dr. Gustavo Lah made aware.  She will need GI evaluation.   Lequita Asal, MD  09/25/2016, 2:22 PM

## 2016-09-28 NOTE — Discharge Instructions (Signed)

## 2016-09-29 ENCOUNTER — Inpatient Hospital Stay: Payer: Medicare Other

## 2016-09-30 ENCOUNTER — Ambulatory Visit
Admission: RE | Admit: 2016-09-30 | Discharge: 2016-09-30 | Disposition: A | Discharge status: Home / Self Care | Payer: Medicare Other | Source: Ambulatory Visit | Attending: Ophthalmology | Admitting: Ophthalmology

## 2016-09-30 ENCOUNTER — Encounter
Admission: RE | Disposition: A | Discharge status: Home / Self Care | Payer: Self-pay | Source: Ambulatory Visit | Attending: Ophthalmology

## 2016-09-30 ENCOUNTER — Ambulatory Visit: Payer: Medicare Other | Admitting: Anesthesiology

## 2016-09-30 DIAGNOSIS — Z885 Allergy status to narcotic agent status: Secondary | ICD-10-CM | POA: Insufficient documentation

## 2016-09-30 DIAGNOSIS — Z87891 Personal history of nicotine dependence: Secondary | ICD-10-CM | POA: Diagnosis not present

## 2016-09-30 DIAGNOSIS — M069 Rheumatoid arthritis, unspecified: Secondary | ICD-10-CM | POA: Insufficient documentation

## 2016-09-30 DIAGNOSIS — K219 Gastro-esophageal reflux disease without esophagitis: Secondary | ICD-10-CM | POA: Insufficient documentation

## 2016-09-30 DIAGNOSIS — M810 Age-related osteoporosis without current pathological fracture: Secondary | ICD-10-CM | POA: Insufficient documentation

## 2016-09-30 DIAGNOSIS — H2512 Age-related nuclear cataract, left eye: Secondary | ICD-10-CM | POA: Diagnosis present

## 2016-09-30 HISTORY — PX: CATARACT EXTRACTION W/PHACO: SHX586

## 2016-09-30 SURGERY — PHACOEMULSIFICATION, CATARACT, WITH IOL INSERTION
Anesthesia: Monitor Anesthesia Care | Site: Eye | Laterality: Left | Wound class: Clean

## 2016-09-30 MED ORDER — ACETAMINOPHEN 325 MG PO TABS: 325.0000 mg | ORAL_TABLET | ORAL | Status: DC | PRN

## 2016-09-30 MED ORDER — MOXIFLOXACIN HCL 0.5 % OP SOLN
1.0000 [drp] | OPHTHALMIC | Status: DC | PRN
  Administered 2016-09-30 (×3): 1 [drp] via OPHTHALMIC

## 2016-09-30 MED ORDER — CEFUROXIME OPHTHALMIC INJECTION 1 MG/0.1 ML
INJECTION | OPHTHALMIC | Status: DC | PRN
  Administered 2016-09-30: 0.1 mL via INTRACAMERAL

## 2016-09-30 MED ORDER — NA HYALUR & NA CHOND-NA HYALUR 0.4-0.35 ML IO KIT
PACK | INTRAOCULAR | Status: DC | PRN
  Administered 2016-09-30: 1 mL via INTRAOCULAR

## 2016-09-30 MED ORDER — EPINEPHRINE PF 1 MG/ML IJ SOLN
INTRAMUSCULAR | Status: DC | PRN
  Administered 2016-09-30: 60 mL via OPHTHALMIC

## 2016-09-30 MED ORDER — MIDAZOLAM HCL 2 MG/2ML IJ SOLN
INTRAMUSCULAR | Status: DC | PRN
  Administered 2016-09-30: 2 mg via INTRAVENOUS

## 2016-09-30 MED ORDER — BRIMONIDINE TARTRATE-TIMOLOL 0.2-0.5 % OP SOLN
OPHTHALMIC | Status: DC | PRN
  Administered 2016-09-30: 1 [drp] via OPHTHALMIC

## 2016-09-30 MED ORDER — ARMC OPHTHALMIC DILATING DROPS
1.0000 "application " | OPHTHALMIC | Status: DC | PRN
  Administered 2016-09-30 (×3): 1 via OPHTHALMIC

## 2016-09-30 MED ORDER — ONDANSETRON HCL 4 MG/2ML IJ SOLN
4.0000 mg | Freq: Once | INTRAMUSCULAR | Status: AC
  Administered 2016-09-30: 4 mg via INTRAVENOUS

## 2016-09-30 MED ORDER — FENTANYL CITRATE (PF) 100 MCG/2ML IJ SOLN
INTRAMUSCULAR | Status: DC | PRN
  Administered 2016-09-30: 50 ug via INTRAVENOUS

## 2016-09-30 MED ORDER — LIDOCAINE HCL (PF) 2 % IJ SOLN
INTRAMUSCULAR | Status: DC | PRN
  Administered 2016-09-30: .5 mL via INTRAOCULAR

## 2016-09-30 MED ORDER — ACETAMINOPHEN 160 MG/5ML PO SOLN: 325.0000 mg | ORAL | Status: DC | PRN

## 2016-09-30 SURGICAL SUPPLY — 25 items
CANNULA ANT/CHMB 27GA (MISCELLANEOUS) ×3 IMPLANT
CARTRIDGE ABBOTT (MISCELLANEOUS) IMPLANT
GLOVE SURG LX 7.5 STRW (GLOVE) ×2
GLOVE SURG LX STRL 7.5 STRW (GLOVE) ×1 IMPLANT
GLOVE SURG TRIUMPH 8.0 PF LTX (GLOVE) ×3 IMPLANT
GOWN STRL REUS W/ TWL LRG LVL3 (GOWN DISPOSABLE) ×2 IMPLANT
GOWN STRL REUS W/TWL LRG LVL3 (GOWN DISPOSABLE) ×4
LENS IOL TECNIS ITEC 20.5 (Intraocular Lens) ×3 IMPLANT
MARKER SKIN DUAL TIP RULER LAB (MISCELLANEOUS) ×3 IMPLANT
NDL RETROBULBAR .5 NSTRL (NEEDLE) IMPLANT
NEEDLE FILTER BLUNT 18X 1/2SAF (NEEDLE) ×2
NEEDLE FILTER BLUNT 18X1 1/2 (NEEDLE) ×1 IMPLANT
PACK CATARACT BRASINGTON (MISCELLANEOUS) ×3 IMPLANT
PACK EYE AFTER SURG (MISCELLANEOUS) ×3 IMPLANT
PACK OPTHALMIC (MISCELLANEOUS) ×3 IMPLANT
RING MALYGIN 7.0 (MISCELLANEOUS) IMPLANT
SUT ETHILON 10-0 CS-B-6CS-B-6 (SUTURE)
SUT VICRYL  9 0 (SUTURE)
SUT VICRYL 9 0 (SUTURE) IMPLANT
SUTURE EHLN 10-0 CS-B-6CS-B-6 (SUTURE) IMPLANT
SYR 3ML LL SCALE MARK (SYRINGE) ×3 IMPLANT
SYR 5ML LL (SYRINGE) ×3 IMPLANT
SYR TB 1ML LUER SLIP (SYRINGE) ×3 IMPLANT
WATER STERILE IRR 250ML POUR (IV SOLUTION) ×3 IMPLANT
WIPE NON LINTING 3.25X3.25 (MISCELLANEOUS) ×3 IMPLANT

## 2016-09-30 NOTE — Anesthesia Preprocedure Evaluation (Signed)
Anesthesia Evaluation  Patient identified by MRN, date of birth, ID band Patient awake    Reviewed: Allergy & Precautions, H&P , NPO status , Patient's Chart, lab work & pertinent test results  Airway Mallampati: II  TM Distance: >3 FB Neck ROM: full    Dental no notable dental hx.    Pulmonary shortness of breath, former smoker,    Pulmonary exam normal breath sounds clear to auscultation       Cardiovascular Normal cardiovascular exam Rhythm:regular Rate:Normal     Neuro/Psych  Headaches,    GI/Hepatic GERD  ,  Endo/Other    Renal/GU      Musculoskeletal   Abdominal   Peds  Hematology   Anesthesia Other Findings   Reproductive/Obstetrics                             Anesthesia Physical  Anesthesia Plan  ASA: II  Anesthesia Plan: MAC   Post-op Pain Management:    Induction:   PONV Risk Score and Plan: 2 and Midazolam  Airway Management Planned:   Additional Equipment:   Intra-op Plan:   Post-operative Plan:   Informed Consent: I have reviewed the patients History and Physical, chart, labs and discussed the procedure including the risks, benefits and alternatives for the proposed anesthesia with the patient or authorized representative who has indicated his/her understanding and acceptance.     Plan Discussed with: CRNA  Anesthesia Plan Comments:         Anesthesia Quick Evaluation

## 2016-09-30 NOTE — H&P (Signed)
The History and Physical notes are on paper, have been signed, and are to be scanned. The patient remains stable and unchanged from the H&P.   Previous H&P reviewed, patient examined, and there are no changes.  Ashley Mack 09/30/2016 10:23 AM

## 2016-09-30 NOTE — Anesthesia Postprocedure Evaluation (Signed)
Anesthesia Post Note  Patient: Ashley Mack  Procedure(s) Performed: Procedure(s) (LRB): CATARACT EXTRACTION PHACO AND INTRAOCULAR LENS PLACEMENT ([facility]) Left (Left)  Patient location during evaluation: PACU Anesthesia Type: MAC Level of consciousness: awake and alert and oriented Pain management: satisfactory to patient Vital Signs Assessment: post-procedure vital signs reviewed and stable Respiratory status: spontaneous breathing, nonlabored ventilation and respiratory function stable Cardiovascular status: blood pressure returned to baseline and stable Postop Assessment: Adequate PO intake and No signs of nausea or vomiting Anesthetic complications: no    Raliegh Ip

## 2016-09-30 NOTE — Transfer of Care (Signed)
Immediate Anesthesia Transfer of Care Note  Patient: Ashley Mack  Procedure(s) Performed: Procedure(s): CATARACT EXTRACTION PHACO AND INTRAOCULAR LENS PLACEMENT ([facility]) Left (Left)  Patient Location: PACU  Anesthesia Type: MAC  Level of Consciousness: awake, alert  and patient cooperative  Airway and Oxygen Therapy: Patient Spontanous Breathing and Patient connected to supplemental oxygen  Post-op Assessment: Post-op Vital signs reviewed, Patient's Cardiovascular Status Stable, Respiratory Function Stable, Patent Airway and No signs of Nausea or vomiting  Post-op Vital Signs: Reviewed and stable  Complications: No apparent anesthesia complications

## 2016-09-30 NOTE — Anesthesia Procedure Notes (Signed)
Procedure Name: MAC Performed by: Moataz Tavis Pre-anesthesia Checklist: Patient identified, Emergency Drugs available, Suction available, Timeout performed and Patient being monitored Patient Re-evaluated:Patient Re-evaluated prior to inductionOxygen Delivery Method: Nasal cannula Placement Confirmation: positive ETCO2     

## 2016-09-30 NOTE — Op Note (Signed)
OPERATIVE NOTE  ASAL TEAS [id number] 09/30/2016   PREOPERATIVE DIAGNOSIS:  Nuclear sclerotic cataract left eye. H25.12   POSTOPERATIVE DIAGNOSIS:    Nuclear sclerotic cataract left eye.     PROCEDURE:  Phacoemusification with posterior chamber intraocular lens placement of the left eye   LENS:   Implant Name Type Inv. Item Serial No. Manufacturer Lot No. LRB No. Used  LENS IOL DIOP 20.5 - [id number] Intraocular Lens LENS IOL DIOP 20.5 [id number] AMO   Left 1        ULTRASOUND TIME: 14  % of 1 minutes 5 seconds, CDE 9.4  SURGEON:  Wyonia Hough, MD   ANESTHESIA:  Topical with tetracaine drops and 2% Xylocaine jelly, augmented with 1% preservative-free intracameral lidocaine.    COMPLICATIONS:  None.   DESCRIPTION OF PROCEDURE:  The patient was identified in the holding room and transported to the operating room and placed in the supine position under the operating microscope.  The left eye was identified as the operative eye and it was prepped and draped in the usual sterile ophthalmic fashion.   A 1 millimeter clear-corneal paracentesis was made at the 1:30 position.  0.5 ml of preservative-free 1% lidocaine was injected into the anterior chamber.  The anterior chamber was filled with Viscoat viscoelastic.  A 2.4 millimeter keratome was used to make a near-clear corneal incision at the 10:30 position.  .  A curvilinear capsulorrhexis was made with a cystotome and capsulorrhexis forceps.  Balanced salt solution was used to hydrodissect and hydrodelineate the nucleus.   Phacoemulsification was then used in stop and chop fashion to remove the lens nucleus and epinucleus.  The remaining cortex was then removed using the irrigation and aspiration handpiece. Provisc was then placed into the capsular bag to distend it for lens placement.  A lens was then injected into the capsular bag.  The remaining viscoelastic was aspirated.   Wounds were hydrated with balanced salt  solution.  The anterior chamber was inflated to a physiologic pressure with balanced salt solution.  No wound leaks were noted. Cefuroxime 0.1 ml of a 10mg /ml solution was injected into the anterior chamber for a dose of 1 mg of intracameral antibiotic at the completion of the case.   Timolol and Brimonidine drops were applied to the eye.  The patient was taken to the recovery room in stable condition without complications of anesthesia or surgery.  Angle Karel 09/30/2016, 11:52 AM

## 2016-10-01 ENCOUNTER — Inpatient Hospital Stay: Payer: Medicare Other

## 2016-10-01 ENCOUNTER — Encounter: Payer: Self-pay | Admitting: Ophthalmology

## 2016-10-01 DIAGNOSIS — D509 Iron deficiency anemia, unspecified: Secondary | ICD-10-CM | POA: Diagnosis not present

## 2016-10-01 DIAGNOSIS — E538 Deficiency of other specified B group vitamins: Secondary | ICD-10-CM

## 2016-10-01 MED ORDER — CYANOCOBALAMIN 1000 MCG/ML IJ SOLN
1000.0000 ug | Freq: Once | INTRAMUSCULAR | Status: AC
  Administered 2016-10-01: 1000 ug via INTRAMUSCULAR
  Filled 2016-10-01: qty 1

## 2016-10-02 ENCOUNTER — Inpatient Hospital Stay: Payer: Medicare Other

## 2016-10-02 DIAGNOSIS — D509 Iron deficiency anemia, unspecified: Secondary | ICD-10-CM | POA: Diagnosis not present

## 2016-10-03 DIAGNOSIS — D509 Iron deficiency anemia, unspecified: Secondary | ICD-10-CM | POA: Diagnosis not present

## 2016-10-06 ENCOUNTER — Other Ambulatory Visit: Payer: Self-pay

## 2016-10-06 ENCOUNTER — Telehealth: Payer: Self-pay | Admitting: *Deleted

## 2016-10-06 ENCOUNTER — Inpatient Hospital Stay: Payer: Medicare Other

## 2016-10-06 DIAGNOSIS — D509 Iron deficiency anemia, unspecified: Secondary | ICD-10-CM

## 2016-10-06 DIAGNOSIS — E538 Deficiency of other specified B group vitamins: Secondary | ICD-10-CM

## 2016-10-06 LAB — OCCULT BLOOD X 1 CARD TO LAB, STOOL
Fecal Occult Bld: POSITIVE — AB
Fecal Occult Bld: POSITIVE — AB
Fecal Occult Bld: NEGATIVE

## 2016-10-06 NOTE — Telephone Encounter (Signed)
Called patient to give her stool card results.  She states she is a patient of Dr. Gustavo Lah.  I forwarded the stool card results to him and asked that his office contact patient for appointment.  Patient is aware that his office will be calling.

## 2016-10-06 NOTE — Telephone Encounter (Signed)
-----   Message from Lequita Asal, MD sent at 10/06/2016  3:15 PM EDT ----- Regarding: Please contact patient  Multiple stools guaiac positive.  She needs to be seen by GI.  Does she have a GI physician or do we need to refer her?  M  ----- Message ----- From: Interface, Lab In [place] Sent: 10/06/2016   2:25 PM To: Lequita Asal, MD

## 2016-10-09 ENCOUNTER — Inpatient Hospital Stay: Payer: Medicare Other

## 2016-10-09 DIAGNOSIS — D509 Iron deficiency anemia, unspecified: Secondary | ICD-10-CM | POA: Diagnosis not present

## 2016-10-09 DIAGNOSIS — E538 Deficiency of other specified B group vitamins: Secondary | ICD-10-CM

## 2016-10-09 MED ORDER — CYANOCOBALAMIN 1000 MCG/ML IJ SOLN
1000.0000 ug | Freq: Once | INTRAMUSCULAR | Status: AC
  Administered 2016-10-09: 1000 ug via INTRAMUSCULAR
  Filled 2016-10-09: qty 1

## 2016-10-13 ENCOUNTER — Inpatient Hospital Stay: Payer: Medicare Other

## 2016-10-16 ENCOUNTER — Inpatient Hospital Stay: Payer: Medicare Other | Attending: Hematology and Oncology

## 2016-10-16 ENCOUNTER — Inpatient Hospital Stay: Payer: Medicare Other

## 2016-10-16 ENCOUNTER — Other Ambulatory Visit: Payer: Medicare Other

## 2016-10-16 DIAGNOSIS — E538 Deficiency of other specified B group vitamins: Secondary | ICD-10-CM

## 2016-10-16 DIAGNOSIS — Z79899 Other long term (current) drug therapy: Secondary | ICD-10-CM | POA: Insufficient documentation

## 2016-10-16 DIAGNOSIS — D509 Iron deficiency anemia, unspecified: Secondary | ICD-10-CM | POA: Insufficient documentation

## 2016-10-16 LAB — CBC WITH DIFFERENTIAL/PLATELET
Basophils Absolute: 0.1 10*3/uL (ref 0–0.1)
Basophils Relative: 1 %
Eosinophils Absolute: 0 10*3/uL (ref 0–0.7)
Eosinophils Relative: 0 %
HCT: 33.8 % — ABNORMAL LOW (ref 35.0–47.0)
Hemoglobin: 11 g/dL — ABNORMAL LOW (ref 12.0–16.0)
Lymphocytes Relative: 8 %
Lymphs Abs: 1.3 10*3/uL (ref 1.0–3.6)
MCH: 28 pg (ref 26.0–34.0)
MCHC: 32.6 g/dL (ref 32.0–36.0)
MCV: 86 fL (ref 80.0–100.0)
Monocytes Absolute: 0.6 10*3/uL (ref 0.2–0.9)
Monocytes Relative: 4 %
Neutro Abs: 15.1 10*3/uL — ABNORMAL HIGH (ref 1.4–6.5)
Neutrophils Relative %: 87 %
Platelets: 470 10*3/uL — ABNORMAL HIGH (ref 150–440)
RBC: 3.93 MIL/uL (ref 3.80–5.20)
RDW: 17.8 % — ABNORMAL HIGH (ref 11.5–14.5)
WBC: 17.2 10*3/uL — ABNORMAL HIGH (ref 3.6–11.0)

## 2016-10-16 MED ORDER — CYANOCOBALAMIN 1000 MCG/ML IJ SOLN
1000.0000 ug | Freq: Once | INTRAMUSCULAR | Status: AC
  Administered 2016-10-16: 1000 ug via INTRAMUSCULAR
  Filled 2016-10-16: qty 1

## 2016-11-17 ENCOUNTER — Inpatient Hospital Stay: Payer: Medicare Other

## 2016-11-17 ENCOUNTER — Inpatient Hospital Stay: Payer: Medicare Other | Admitting: Hematology and Oncology

## 2016-11-26 ENCOUNTER — Inpatient Hospital Stay: Payer: Medicare Other

## 2016-11-26 ENCOUNTER — Inpatient Hospital Stay: Payer: Medicare Other | Attending: Hematology and Oncology

## 2016-11-26 ENCOUNTER — Other Ambulatory Visit: Payer: Self-pay | Admitting: *Deleted

## 2016-11-26 ENCOUNTER — Other Ambulatory Visit: Payer: Self-pay | Admitting: Hematology and Oncology

## 2016-11-26 ENCOUNTER — Encounter: Payer: Self-pay | Admitting: Hematology and Oncology

## 2016-11-26 ENCOUNTER — Inpatient Hospital Stay (HOSPITAL_BASED_OUTPATIENT_CLINIC_OR_DEPARTMENT_OTHER): Payer: Medicare Other | Admitting: Hematology and Oncology

## 2016-11-26 VITALS — BP 165/92 | HR 80 | Temp 97.7°F | Resp 16 | Ht 62.0 in | Wt 155.6 lb

## 2016-11-26 DIAGNOSIS — M069 Rheumatoid arthritis, unspecified: Secondary | ICD-10-CM | POA: Diagnosis not present

## 2016-11-26 DIAGNOSIS — Z79899 Other long term (current) drug therapy: Secondary | ICD-10-CM | POA: Insufficient documentation

## 2016-11-26 DIAGNOSIS — K449 Diaphragmatic hernia without obstruction or gangrene: Secondary | ICD-10-CM | POA: Diagnosis not present

## 2016-11-26 DIAGNOSIS — Z9841 Cataract extraction status, right eye: Secondary | ICD-10-CM | POA: Diagnosis not present

## 2016-11-26 DIAGNOSIS — K219 Gastro-esophageal reflux disease without esophagitis: Secondary | ICD-10-CM | POA: Diagnosis not present

## 2016-11-26 DIAGNOSIS — R195 Other fecal abnormalities: Secondary | ICD-10-CM | POA: Insufficient documentation

## 2016-11-26 DIAGNOSIS — Z87891 Personal history of nicotine dependence: Secondary | ICD-10-CM | POA: Insufficient documentation

## 2016-11-26 DIAGNOSIS — D509 Iron deficiency anemia, unspecified: Secondary | ICD-10-CM

## 2016-11-26 DIAGNOSIS — E538 Deficiency of other specified B group vitamins: Secondary | ICD-10-CM

## 2016-11-26 LAB — CBC WITH DIFFERENTIAL/PLATELET
Basophils Absolute: 0.1 10*3/uL (ref 0–0.1)
Basophils Relative: 1 %
Eosinophils Absolute: 0.1 10*3/uL (ref 0–0.7)
Eosinophils Relative: 1 %
HCT: 32.9 % — ABNORMAL LOW (ref 35.0–47.0)
Hemoglobin: 11 g/dL — ABNORMAL LOW (ref 12.0–16.0)
Lymphocytes Relative: 12 %
Lymphs Abs: 1.7 10*3/uL (ref 1.0–3.6)
MCH: 28.2 pg (ref 26.0–34.0)
MCHC: 33.5 g/dL (ref 32.0–36.0)
MCV: 84.4 fL (ref 80.0–100.0)
Monocytes Absolute: 0.9 10*3/uL (ref 0.2–0.9)
Monocytes Relative: 7 %
Neutro Abs: 10.7 10*3/uL — ABNORMAL HIGH (ref 1.4–6.5)
Neutrophils Relative %: 79 %
Platelets: 475 10*3/uL — ABNORMAL HIGH (ref 150–440)
RBC: 3.9 MIL/uL (ref 3.80–5.20)
RDW: 17.4 % — ABNORMAL HIGH (ref 11.5–14.5)
WBC: 13.5 10*3/uL — ABNORMAL HIGH (ref 3.6–11.0)

## 2016-11-26 LAB — FERRITIN: Ferritin: 16 ng/mL (ref 11–307)

## 2016-11-26 MED ORDER — CYANOCOBALAMIN 1000 MCG/ML IJ SOLN
1000.0000 ug | Freq: Once | INTRAMUSCULAR | Status: AC
  Administered 2016-11-26: 1000 ug via INTRAMUSCULAR
  Filled 2016-11-26: qty 1

## 2016-11-26 NOTE — Progress Notes (Signed)
[facility] day:  11/26/2016  Chief Complaint: Ashley Mack is a 71 y.o. female with iron deficiency anemia and B12 deficiency who is seen for 2 month assessment and continuation of B12.  HPI:  The patient was last seen in the hematology clinic on 09/25/2016.  At that time,  she remained fatigued.  She was less short of breath.  Exam was stable.  Hematocrit was 29.2 and hemoglobin 9.7.  Ferritin was 66.  Two of 3 guaiac cards were positive.  Dr. Gustavo Lah was contacted for a GI evaluation.  She received Venofer.  She completed weekly B12 x 6 on 10/16/2016.  CBC revealed a hematocrit 33.8, hemoglobin 11, MCV 86, platelets 470,000, white count 17,200.  She was seen by Stephens November on 10/20/2016.  She is scheduled for colonoscopy on 02/15/2017. Patient with a history of an ulcer.   Symptomatically, patient feeling "ok". She is tired and exertionally short of breath. She has no physical complaints of anything today. She denies obvious bleeding; no melena, hematochezia, or vaginal bleeding. Diet is good.  She is eating iron rich foods as directed.    Past Medical History:  Diagnosis Date  . Anemia   . Arthritis    Rheumatoid  . Dyspnea   . GERD (gastroesophageal reflux disease)   . History of fractured vertebra    sees chiropractor  . Migraine headache    in past  . Motion sickness   . 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 Vertigo     Past Surgical History:  Procedure Laterality Date  . CATARACT EXTRACTION W/PHACO Right 08/12/2016   Procedure: CATARACT EXTRACTION PHACO AND INTRAOCULAR LENS PLACEMENT ([place])  Right;  Surgeon: Leandrew Koyanagi, MD;  Location: [facility];  Service: Ophthalmology;  Laterality: Right;  . CATARACT EXTRACTION W/PHACO Left 09/30/2016   Procedure: CATARACT EXTRACTION PHACO AND INTRAOCULAR LENS PLACEMENT ([place]) Left;  Surgeon: Leandrew Koyanagi, MD;  Location: [facility];  Service: Ophthalmology;  Laterality:  Left;  . OOPHORECTOMY Bilateral 2003    History reviewed. No pertinent family history.  Social History:  reports that she quit smoking about 9 years ago. She has never used smokeless tobacco. She reports that she does not drink alcohol or use drugs.  She quit smoking in 2009.  She lives in [place].  The patient is accompanied by [employer], her step daughter, today.  Allergies:  Allergies  Allergen Reactions  . Hydrocodone-Acetaminophen Nausea And Vomiting    Dizziness  . Meloxicam Nausea And Vomiting  . Other Nausea Only    Arthritis medications   . Oxycodone     Dizzy, foggy feeling  . Promethazine Hcl Other (See Comments)  . Propoxyphene Nausea Only  . Ranitidine Hives  . Sulfasalazine Other (See Comments)    Stomach upset  . Topiramate Nausea Only    Current Medications: Current Outpatient Prescriptions  Medication Sig Dispense Refill  . Budesonide-Formoterol Fumarate (SYMBICORT IN) Inhale into the lungs 2 (two) times daily.    . Cholecalciferol (VITAMIN D3) 2000 UNITS capsule Take 1 capsule by mouth daily.    . Cyanocobalamin (VITAMIN B-12 IJ) Inject as directed every 30 (thirty) days.    Marland Kitchen DEXILANT 60 MG capsule Take 1 capsule by mouth daily.    . Fe Fum-FePoly-Vit C-Vit B3 (INTEGRA) 62.5-62.5-40-3 MG CAPS Take 1 capsule by mouth daily.    . fluticasone (FLONASE) 50 MCG/ACT nasal spray Place into both nostrils daily as needed for allergies or rhinitis.    Marland Kitchen  naproxen sodium (ANAPROX) 220 MG tablet Take 220 mg by mouth as needed.    . predniSONE (DELTASONE) 10 MG tablet Take 1 tablet by mouth daily.     No current facility-administered medications for this visit.     Review of Systems:  GENERAL:  Fatigue.  No fevers or sweats.  Weight up 1 pound. PERFORMANCE STATUS (ECOG):  1 HEENT:  Poor vision.  Cataract surgery next Wednesday.  No runny nose, sore throat, mouth sores or tenderness. Lungs: Shortness of breath, improved.  No cough.  No hemoptysis. Cardiac:  No chest  pain, palpitations, orthopnea, or PND. GI:  No nausea, vomiting, diarrhea, constipation, melena or hematochezia. GU:  No urgency, frequency, dysuria, or hematuria. Musculoskeletal:  Back pain with 5 disk bulges (sees a chiropractor).  Arthritis.  No muscle tenderness. Extremities:  No pain or swelling. Skin:  No rashes or skin changes. Neuro:  Occasional migraine headache for years.  No numbness or weakness, balance or coordination issues. Endocrine:  No diabetes, thyroid issues, hot flashes or night sweats. Psych:  No mood changes, depression or anxiety. Pain:  No focal pain. Review of systems:  All other systems reviewed and found to be negative.  Physical Exam: Blood pressure (!) 167/105, pulse 80, temperature 97.7 F (36.5 C), temperature source Tympanic, resp. rate 16, height 5\' 2"  (1.575 m), weight 155 lb 9.6 oz (70.6 kg), SpO2 96 %. GENERAL:  Well developed, well nourished, woman sitting comfortably in the exam room in no acute distress. MENTAL STATUS:  Alert and oriented to person, place and time. HEAD:  Short brown hair.  Normocephalic, atraumatic, face symmetric, no Cushingoid features. EYES:  Glasses.  Blue eyes.  s/p right cataract surgery.  Pupils equal round and reactive to light and accomodation.  No conjunctivitis or scleral icterus. ENT:  Oropharynx clear without lesion.  Tongue normal.  No teeth.  Mucous membranes moist.  RESPIRATORY:  Clear to auscultation without rales, wheezes or rhonchi. CARDIOVASCULAR:  Regular rate and rhythm without murmur, rub or gallop. ABDOMEN:  Soft, non-tender, with active bowel sounds, and no hepatosplenomegaly.  No masses. SKIN:  No rashes, ulcers or lesions. EXTREMITIES: No edema, no skin discoloration or tenderness.  No palpable cords. LYMPH NODES: No palpable cervical, supraclavicular, axillary or inguinal adenopathy  NEUROLOGICAL: Unremarkable. PSYCH:  Appropriate.   Appointment on 11/26/2016  Component Date Value Ref Range Status   . WBC 11/26/2016 13.5* 3.6 - 11.0 K/uL Final  . RBC 11/26/2016 3.90  3.80 - 5.20 MIL/uL Final  . Hemoglobin 11/26/2016 11.0* 12.0 - 16.0 g/dL Final  . HCT 11/26/2016 32.9* 35.0 - 47.0 % Final  . MCV 11/26/2016 84.4  80.0 - 100.0 fL Final  . MCH 11/26/2016 28.2  26.0 - 34.0 pg Final  . MCHC 11/26/2016 33.5  32.0 - 36.0 g/dL Final  . RDW 11/26/2016 17.4* 11.5 - 14.5 % Final  . Platelets 11/26/2016 475* 150 - 440 K/uL Final  . Neutrophils Relative % 11/26/2016 79  % Final  . Neutro Abs 11/26/2016 10.7* 1.4 - 6.5 K/uL Final  . Lymphocytes Relative 11/26/2016 12  % Final  . Lymphs Abs 11/26/2016 1.7  1.0 - 3.6 K/uL Final  . Monocytes Relative 11/26/2016 7  % Final  . Monocytes Absolute 11/26/2016 0.9  0.2 - 0.9 K/uL Final  . Eosinophils Relative 11/26/2016 1  % Final  . Eosinophils Absolute 11/26/2016 0.1  0 - 0.7 K/uL Final  . Basophils Relative 11/26/2016 1  % Final  . Basophils Absolute  11/26/2016 0.1  0 - 0.1 K/uL Final    Assessment:  IXEL BOEHNING is a 71 y.o. female with iron deficiency anemia and B12 deficiency.  Diet is good.  She was on oral iron (Integra) for years.  She is unsure of any melena or hematochezia as she can not see well.    Work-up on 08/31/2016 revealed a hematocrit 30.6, hemoglobin 9.9, and MCV 83.2.  B12 was 90.  Ferritin was 13.  Sed rate was 45 (elevated).  Retic was 2.6%.  Normal studies included the following:  Coombs, folate, iron saturation, TIBC, SPEP, and free light chain ratio.  Normal studies on 07/29/2016 included: creatinine, calcium, albumin, protein, liver function tests, and TSH.  She had a colonoscopy on 03/18/2009. She has had several EGDs with the last on 10/12/2008.  She states that she has had polyps, reflux and a hiatal hernia.  She began weekly B12 on 09/08/2016 (last 10/16/2016).  She received Venofer on 09/08/2016, 09/15/2016, and 09/25/2016.  She has rheumatoid arthritis.  She has been treated with methotrexate, Imuran, leflunomide, and  sulfasalazine.  She is on prednisone 5 mg a day x 2 years.  Symptomatically, she remains fatigued.  She is exertionally short of breath.  Exam is stable.  Hematocrit is 32.9 and hemoglobin 11.0.  Ferritin is 16.  Plan: 1.  Labs today: CBC with diff, ferritin. 2.  B12 today, then monthly x 3 3.  Call patient with lab results. Anticipate additional Venofer if ferritin remains low. 4.  Follow-up colonoscopy scheduled for 02/15/2017. 4.  RTC on 12/24/2016 for B12 and labs (CBC with diff). 6.  RTC on 03/01/2017 for MD assessment, labs (CBC with diff, ferritin), and B12.  Addendum:  Ferritin was low.  Patient scheduled for additional Venofer x 3.   Honor Loh, NP  11/26/2016, 10:02 AM   I saw and evaluated the patient, participating in the key portions of the service and reviewing pertinent diagnostic studies and records.  I reviewed the nurse practitioner's note and agree with the findings and the plan.  The assessment and plan were discussed with the patient.  A few questions were asked by the patient and answered.   Lequita Asal, MD 11/26/2016

## 2016-11-26 NOTE — Progress Notes (Signed)
Patient here for follow up. She states she has been feeling very tired for the past week, she is experiencing shortness of breath on excertion.

## 2016-11-27 ENCOUNTER — Telehealth: Payer: Self-pay | Admitting: *Deleted

## 2016-11-27 NOTE — Telephone Encounter (Signed)
Called patient to inform her that her labs are normal.

## 2016-12-02 ENCOUNTER — Inpatient Hospital Stay: Payer: Medicare Other

## 2016-12-02 VITALS — BP 147/87 | HR 84 | Temp 98.3°F | Resp 18

## 2016-12-02 DIAGNOSIS — E538 Deficiency of other specified B group vitamins: Secondary | ICD-10-CM

## 2016-12-02 DIAGNOSIS — D509 Iron deficiency anemia, unspecified: Secondary | ICD-10-CM | POA: Diagnosis not present

## 2016-12-02 MED ORDER — SODIUM CHLORIDE 0.9 % IV SOLN
Freq: Once | INTRAVENOUS | Status: AC
  Administered 2016-12-02: 10:00:00 via INTRAVENOUS
  Filled 2016-12-02: qty 1000

## 2016-12-02 MED ORDER — IRON SUCROSE 20 MG/ML IV SOLN
200.0000 mg | Freq: Once | INTRAVENOUS | Status: AC
  Administered 2016-12-02: 200 mg via INTRAVENOUS
  Filled 2016-12-02: qty 10

## 2016-12-09 ENCOUNTER — Ambulatory Visit
Admission: RE | Admit: 2016-12-09 | Discharge: 2016-12-09 | Disposition: A | Discharge status: Home / Self Care | Payer: Medicare Other | Source: Ambulatory Visit | Attending: Surgery | Admitting: Surgery

## 2016-12-09 ENCOUNTER — Ambulatory Visit: Payer: Medicare Other | Admitting: Anesthesiology

## 2016-12-09 ENCOUNTER — Inpatient Hospital Stay: Payer: Medicare Other

## 2016-12-09 ENCOUNTER — Encounter: Payer: Self-pay | Admitting: Anesthesiology

## 2016-12-09 ENCOUNTER — Encounter
Admission: RE | Disposition: A | Discharge status: Home / Self Care | Payer: Self-pay | Source: Ambulatory Visit | Attending: Surgery

## 2016-12-09 DIAGNOSIS — Z882 Allergy status to sulfonamides status: Secondary | ICD-10-CM | POA: Diagnosis not present

## 2016-12-09 DIAGNOSIS — K449 Diaphragmatic hernia without obstruction or gangrene: Secondary | ICD-10-CM | POA: Diagnosis not present

## 2016-12-09 DIAGNOSIS — K551 Chronic vascular disorders of intestine: Secondary | ICD-10-CM | POA: Diagnosis not present

## 2016-12-09 DIAGNOSIS — D500 Iron deficiency anemia secondary to blood loss (chronic): Secondary | ICD-10-CM | POA: Insufficient documentation

## 2016-12-09 DIAGNOSIS — K641 Second degree hemorrhoids: Secondary | ICD-10-CM | POA: Insufficient documentation

## 2016-12-09 DIAGNOSIS — Z79899 Other long term (current) drug therapy: Secondary | ICD-10-CM | POA: Diagnosis not present

## 2016-12-09 DIAGNOSIS — R195 Other fecal abnormalities: Secondary | ICD-10-CM | POA: Insufficient documentation

## 2016-12-09 DIAGNOSIS — D125 Benign neoplasm of sigmoid colon: Secondary | ICD-10-CM | POA: Insufficient documentation

## 2016-12-09 DIAGNOSIS — M069 Rheumatoid arthritis, unspecified: Secondary | ICD-10-CM | POA: Diagnosis not present

## 2016-12-09 DIAGNOSIS — Z7952 Long term (current) use of systemic steroids: Secondary | ICD-10-CM | POA: Diagnosis not present

## 2016-12-09 DIAGNOSIS — K922 Gastrointestinal hemorrhage, unspecified: Secondary | ICD-10-CM | POA: Diagnosis not present

## 2016-12-09 DIAGNOSIS — Z888 Allergy status to other drugs, medicaments and biological substances status: Secondary | ICD-10-CM | POA: Insufficient documentation

## 2016-12-09 DIAGNOSIS — M810 Age-related osteoporosis without current pathological fracture: Secondary | ICD-10-CM | POA: Diagnosis not present

## 2016-12-09 DIAGNOSIS — K219 Gastro-esophageal reflux disease without esophagitis: Secondary | ICD-10-CM | POA: Insufficient documentation

## 2016-12-09 DIAGNOSIS — Z885 Allergy status to narcotic agent status: Secondary | ICD-10-CM | POA: Diagnosis not present

## 2016-12-09 HISTORY — DX: Iron deficiency anemia, unspecified: D50.9

## 2016-12-09 HISTORY — DX: Systemic involvement of connective tissue, unspecified: M35.9

## 2016-12-09 HISTORY — DX: Age-related osteoporosis without current pathological fracture: M81.0

## 2016-12-09 HISTORY — DX: Other specified diseases of gallbladder: K82.8

## 2016-12-09 HISTORY — DX: Unspecified nephritic syndrome with unspecified morphologic changes: N05.9

## 2016-12-09 HISTORY — DX: Rheumatoid arthritis, unspecified: M06.9

## 2016-12-09 HISTORY — PX: COLONOSCOPY WITH PROPOFOL: SHX5780

## 2016-12-09 HISTORY — DX: Centrilobular emphysema: J43.2

## 2016-12-09 HISTORY — DX: Migraine, unspecified, not intractable, without status migrainosus: G43.909

## 2016-12-09 HISTORY — PX: ESOPHAGOGASTRODUODENOSCOPY (EGD) WITH PROPOFOL: SHX5813

## 2016-12-09 HISTORY — DX: Fatty (change of) liver, not elsewhere classified: K76.0

## 2016-12-09 SURGERY — COLONOSCOPY WITH PROPOFOL
Anesthesia: General

## 2016-12-09 MED ORDER — BUTAMBEN-TETRACAINE-BENZOCAINE 2-2-14 % EX AERO
INHALATION_SPRAY | CUTANEOUS | Status: DC | PRN
  Administered 2016-12-09: 2 via TOPICAL

## 2016-12-09 MED ORDER — MIDAZOLAM HCL 2 MG/2ML IJ SOLN
INTRAMUSCULAR | Status: AC
  Filled 2016-12-09: qty 2

## 2016-12-09 MED ORDER — MIDAZOLAM HCL 2 MG/2ML IJ SOLN
INTRAMUSCULAR | Status: DC | PRN
  Administered 2016-12-09: 2 mg via INTRAVENOUS

## 2016-12-09 MED ORDER — SODIUM CHLORIDE 0.9 % IV SOLN
INTRAVENOUS | Status: DC
  Administered 2016-12-09: 1000 mL via INTRAVENOUS

## 2016-12-09 MED ORDER — PROPOFOL 500 MG/50ML IV EMUL
INTRAVENOUS | Status: AC
  Filled 2016-12-09: qty 50

## 2016-12-09 MED ORDER — GLYCOPYRROLATE 0.2 MG/ML IJ SOLN
INTRAMUSCULAR | Status: DC | PRN
  Administered 2016-12-09: 0.1 mg via INTRAVENOUS

## 2016-12-09 MED ORDER — FENTANYL CITRATE (PF) 100 MCG/2ML IJ SOLN
INTRAMUSCULAR | Status: DC | PRN
  Administered 2016-12-09: 50 ug via INTRAVENOUS

## 2016-12-09 MED ORDER — LIDOCAINE HCL (PF) 1 % IJ SOLN
2.0000 mL | Freq: Once | INTRAMUSCULAR | Status: AC
  Administered 2016-12-09: 0.3 mL via INTRADERMAL

## 2016-12-09 MED ORDER — FENTANYL CITRATE (PF) 100 MCG/2ML IJ SOLN
INTRAMUSCULAR | Status: AC
  Filled 2016-12-09: qty 2

## 2016-12-09 MED ORDER — LIDOCAINE HCL (PF) 1 % IJ SOLN
INTRAMUSCULAR | Status: AC
  Administered 2016-12-09: 0.3 mL via INTRADERMAL
  Filled 2016-12-09: qty 2

## 2016-12-09 MED ORDER — PHENYLEPHRINE HCL 10 MG/ML IJ SOLN
INTRAMUSCULAR | Status: DC | PRN
  Administered 2016-12-09 (×3): 50 ug via INTRAVENOUS

## 2016-12-09 MED ORDER — LIDOCAINE HCL (CARDIAC) 20 MG/ML IV SOLN
INTRAVENOUS | Status: DC | PRN
  Administered 2016-12-09: 30 mg via INTRAVENOUS

## 2016-12-09 MED ORDER — PROPOFOL 500 MG/50ML IV EMUL
INTRAVENOUS | Status: DC | PRN
  Administered 2016-12-09: 100 ug/kg/min via INTRAVENOUS

## 2016-12-09 NOTE — Anesthesia Postprocedure Evaluation (Signed)
Anesthesia Post Note  Patient: Ashley Mack  Procedure(s) Performed: Procedure(s) (LRB): COLONOSCOPY WITH PROPOFOL (N/A) ESOPHAGOGASTRODUODENOSCOPY (EGD) WITH PROPOFOL (N/A)  Patient location during evaluation: Endoscopy Anesthesia Type: General Level of consciousness: awake and alert Pain management: pain level controlled Vital Signs Assessment: post-procedure vital signs reviewed and stable Respiratory status: spontaneous breathing and respiratory function stable Cardiovascular status: stable Anesthetic complications: no     Last Vitals:  Vitals:   12/09/16 1203 12/09/16 1334  BP: (!) 148/78 (!) 80/51  Pulse: (!) 103 97  Resp:  17  Temp: 36.6 C (!) 36.3 C  SpO2: 98% 99%    Last Pain:  Vitals:   12/09/16 1334  TempSrc: Tympanic  PainSc: Asleep                 KEPHART,WILLIAM K

## 2016-12-09 NOTE — Transfer of Care (Signed)
Immediate Anesthesia Transfer of Care Note  Patient: Ashley Mack  Procedure(s) Performed: Procedure(s): COLONOSCOPY WITH PROPOFOL (N/A) ESOPHAGOGASTRODUODENOSCOPY (EGD) WITH PROPOFOL (N/A)  Patient Location: PACU  Anesthesia Type:General  Level of Consciousness: awake and sedated  Airway & Oxygen Therapy: Patient Spontanous Breathing and Patient connected to nasal cannula oxygen  Post-op Assessment: Report given to RN and Post -op Vital signs reviewed and stable  Post vital signs: Reviewed and stable  Last Vitals:  Vitals:   12/09/16 1203  BP: (!) 148/78  Pulse: (!) 103  Temp: 36.6 C  SpO2: 98%    Last Pain:  Vitals:   12/09/16 1203  TempSrc: Tympanic         Complications: No apparent anesthesia complications

## 2016-12-09 NOTE — Anesthesia Post-op Follow-up Note (Signed)
Anesthesia QCDR form completed.        

## 2016-12-09 NOTE — H&P (Signed)
Outpatient short stay form Pre-procedure 12/09/2016 12:57 PM Ashley Mack K. Alice Reichert, M.D.  Primary Physician: Harrold Donath, M.D.  Reason for visit:  Iron deficiency anemia, Heme positive stool  History of present illness:  Pleasant 71 y/o presents for EGD and colonoscopy for personal hx of PUD (06/05/11), gastritis. She had iron deficiency noted on recent bloodwork. She has hx of normal colonoscopy with normal random colon bx in 03/2009 by DR. Skulskie. She is requiring repeat EGD and colonoscopy at this time for the reasons mentioned above.     Current Facility-Administered Medications:  .  0.9 %  sodium chloride infusion, , Intravenous, Continuous, [place], Benay Pike, MD, Last Rate: 20 mL/hr at 12/09/16 1229, 1,000 mL at 12/09/16 1229  Prescriptions Prior to Admission  Medication Sig Dispense Refill Last Dose  . Budesonide-Formoterol Fumarate (SYMBICORT IN) Inhale into the lungs 2 (two) times daily.   Taking  . Cholecalciferol (VITAMIN D3) 2000 UNITS capsule Take 1 capsule by mouth daily.   Taking  . Cyanocobalamin (VITAMIN B-12 IJ) Inject as directed every 30 (thirty) days.   Taking  . DEXILANT 60 MG capsule Take 1 capsule by mouth daily.   Taking  . Fe Fum-FePoly-Vit C-Vit B3 (INTEGRA) 62.5-62.5-40-3 MG CAPS Take 1 capsule by mouth daily.   Taking  . fluticasone (FLONASE) 50 MCG/ACT nasal spray Place into both nostrils daily as needed for allergies or rhinitis.   Taking  . naproxen sodium (ANAPROX) 220 MG tablet Take 220 mg by mouth as needed.   Taking  . predniSONE (DELTASONE) 10 MG tablet Take 1 tablet by mouth daily.   Taking     Allergies  Allergen Reactions  . Hydrocodone-Acetaminophen Nausea And Vomiting    Dizziness  . Meloxicam Nausea And Vomiting  . Other Nausea Only    Arthritis medications   . Oxycodone     Dizzy, foggy feeling  . Promethazine Hcl Other (See Comments)  . Propoxyphene Nausea Only  . Ranitidine Hives  . Sulfasalazine Other (See Comments)    Stomach upset  . Topiramate Nausea Only     Past Medical History:  Diagnosis Date  . Anemia   . Arthritis    Rheumatoid  . [place]y of gallbladder   . Centrilobular emphysema ([place])   . Dyspnea   . Fatty liver   . GERD (gastroesophageal reflux disease)   . History of fractured vertebra    sees chiropractor  . 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 Iron (Fe) deficiency anemia   . Migraine headache    in past  . Migraines   . Motion sickness   . 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 Osteoporosis   . Rheumatoid arthritis ([place])   . Vertigo     Review of systems:   Neg for chest pain, SOB, dysuria. Patient is legally blind.    Physical Exam  Gen: NAD. Appears comfortable.  HEENT: [place]/AT. Normal external ear exam.  Chest: CTA, no wheezes.  CV: RR nl S1, S2. No gallops.  Abd: soft, nt, nd. BS+  Ext: no edema. Pulses 2+  Neuro: Alert and oriented. Judgement appears normal. Nonfocal.    Planned procedures:  Proceed with EGD and colonoscopy. The patient understands the nature of the planned procedure, indications, risks, alternatives and potential complications including but not limited to bleeding, infection, perforation, damage to internal organs and possible oversedation/side effects from anesthesia. The patient agrees and gives consent to proceed.  Please refer to procedure notes for findings,  recommendations and patient disposition/instructions.    Ellard Nan K. Alice Reichert, M.D. Gastroenterology 12/09/2016  12:57 PM

## 2016-12-09 NOTE — Op Note (Addendum)
[facility] Gastroenterology Patient Name: Ashley Mack Procedure Date: 12/09/2016 12:52 PM MRN: [id number] Account #: [id number] Date of Birth: 1946-01-03 Admit Type: Outpatient Age: 71 Room: [facility] ENDO ROOM 1 Gender: Female Note Status: Finalized Procedure:            Upper GI endoscopy Indications:          Occult blood in stool, Iron deficiency anemia secondary                        to chronic blood loss Providers:            Benay Pike. Alice Reichert MD, MD Referring MD:         Ocie Cornfield. Ouida Sills MD, MD (Referring MD) Medicines:            Propofol per Anesthesia Complications:        No immediate complications. Procedure:            Pre-Anesthesia Assessment:                       - ASA Grade Assessment: III - A patient with severe                        systemic disease.                       - After reviewing the risks and benefits, the patient                        was deemed in satisfactory condition to undergo the                        procedure.                       After obtaining informed consent, the endoscope was                        passed under direct vision. Throughout the procedure,                        the patient's blood pressure, pulse, and oxygen                        saturations were monitored continuously. The                        Colonoscope was introduced through the mouth, and                        advanced to the third part of duodenum. The upper GI                        endoscopy was accomplished without difficulty. The                        patient tolerated the procedure well. Findings:      The examined esophagus was normal.      The entire examined stomach was normal.      The examined duodenum was normal.      A small hiatal hernia was present. Impression:           -  Normal esophagus.                       - Normal stomach.                       - Normal examined duodenum.                       - No specimens  collected. Recommendation:       - See the other procedure note for documentation of                        additional recommendations. Procedure Code(s):    --- Professional ---                       [id number], Esophagogastroduodenoscopy, flexible, transoral;                        diagnostic, including collection of specimen(s) by                        brushing or washing, when performed (separate procedure) Diagnosis Code(s):    --- Professional ---                       R19.5, Other fecal abnormalities                       D50.0, Iron deficiency anemia secondary to blood loss                        (chronic) CPT copyright 2016 American Medical Association. All rights reserved. The codes documented in this report are preliminary and upon coder review may  be revised to meet current compliance requirements. Efrain Sella MD, MD 12/09/2016 1:35:39 PM This report has been signed electronically. Number of Addenda: 0 Note Initiated On: 12/09/2016 12:52 PM Scope Withdrawal Time: 0 hours 8 minutes 56 seconds  Total Procedure Duration: 0 hours 17 minutes 47 seconds       [facility]

## 2016-12-09 NOTE — Op Note (Signed)
[facility] Gastroenterology Patient Name: Ashley Mack Procedure Date: 12/09/2016 12:11 PM MRN: [id number] Account #: [id number] Date of Birth: June 06, 1945 Admit Type: Outpatient Age: 71 Room: [facility] ENDO ROOM 1 Gender: Female Note Status: Finalized Procedure:            Colonoscopy Indications:          Heme positive stool, Iron deficiency anemia secondary                        to chronic blood loss Providers:            Benay Pike. [place] MD, MD Complications:        No immediate complications. Procedure:            Pre-Anesthesia Assessment:                       - ASA Grade Assessment: III - A patient with severe                        systemic disease.                       - After reviewing the risks and benefits, the patient                        was deemed in satisfactory condition to undergo the                        procedure.                       After obtaining informed consent, the colonoscope was                        passed under direct vision. Throughout the procedure,                        the patient's blood pressure, pulse, and oxygen                        saturations were monitored continuously. The                        Colonoscope was introduced through the anus and                        advanced to the the cecum, identified by appendiceal                        orifice and ileocecal valve. The colonoscopy was                        performed without difficulty. The patient tolerated the                        procedure well. The quality of the bowel preparation                        was good. Findings:      The perianal and digital rectal examinations were normal. Pertinent       negatives include normal sphincter tone.  A localized area of moderately friable mucosa with contact bleeding was       found in the proximal transverse colon. Biopsies were taken with a cold       forceps for histology. Verification of patient  identification for the       specimen was done by the technician.      Normal mucosa was found in the rectum, in the recto-sigmoid colon, in       the sigmoid colon, in the descending colon and in the ascending colon.       Biopsies were taken with a cold forceps for histology. obtained from the       ascending colon, left colon      Non-bleeding internal hemorrhoids were found during retroflexion. The       hemorrhoids were moderate and Grade II (internal hemorrhoids that       prolapse but reduce spontaneously).      A 1 mm polyp was found in the sigmoid colon. The polyp was sessile. The       polyp was removed with a hot snare. Resection and retrieval were       complete. Verification of patient identification for the specimen was       done by the technician using the patient's name and birth date. Impression:           - Friability with contact bleeding in the proximal                        transverse colon. Biopsied.                       - Normal mucosa in the rectum, in the recto-sigmoid                        colon, in the sigmoid colon, in the descending colon                        and in the ascending colon. Biopsied.                       - Non-bleeding internal hemorrhoids. Recommendation:       - Await pathology results.                       - Return to my office in 3 months.                       - Patient has a contact number available for                        emergencies. The signs and symptoms of potential                        delayed complications were discussed with the patient.                        Return to normal activities tomorrow. Written discharge                        instructions were provided to the patient.                       -  Resume previous diet.                       - Continue present medications.                       - Repeat colonoscopy is recommended. The colonoscopy                        date will be determined after pathology  results from                        today's exam become available for review.                       - The findings and recommendations were discussed with                        the patient.                       - The findings and recommendations were discussed with                        the patient's family.                       - Discharge patient to home (with escort). Procedure Code(s):    --- Professional ---                       [phone], Colonoscopy, flexible; with removal of tumor(s),                        polyp(s), or other lesion(s) by snare technique                       [id number], 52, Colonoscopy, flexible; with biopsy, single                        or multiple Diagnosis Code(s):    --- Professional ---                       K92.2, Gastrointestinal hemorrhage, unspecified                       K64.1, Second degree hemorrhoids                       R19.5, Other fecal abnormalities                       D50.0, Iron deficiency anemia secondary to blood loss                        (chronic) CPT copyright 2016 American Medical Association. All rights reserved. The codes documented in this report are preliminary and upon coder review may  be revised to meet current compliance requirements. Efrain Sella MD, MD 12/09/2016 1:53:09 PM This report has been signed electronically. Number of Addenda: 0 Note Initiated On: 12/09/2016 12:11 PM      [facility]

## 2016-12-09 NOTE — Anesthesia Procedure Notes (Signed)
Performed by: Vaughan Sine Pre-anesthesia Checklist: Patient identified, Emergency Drugs available, Suction available, Patient being monitored and Timeout performed Patient Re-evaluated:Patient Re-evaluated prior to induction Oxygen Delivery Method: Nasal cannula Preoxygenation: Pre-oxygenation with 100% oxygen Induction Type: IV induction Airway Equipment and Method: Bite block

## 2016-12-09 NOTE — Anesthesia Preprocedure Evaluation (Signed)
Anesthesia Evaluation  Patient identified by MRN, date of birth, ID band Patient awake    Reviewed: Allergy & Precautions, NPO status , Patient's Chart, lab work & pertinent test results  History of Anesthesia Complications Negative for: history of anesthetic complications  Airway Mallampati: II       Dental  (+) Upper Dentures, Lower Dentures   Pulmonary COPD,  COPD inhaler, former smoker,           Cardiovascular (-) hypertension(-) Past MI and (-) CHF (-) dysrhythmias (-) Valvular Problems/Murmurs     Neuro/Psych neg Seizures    GI/Hepatic Neg liver ROS, GERD  Medicated and Controlled,  Endo/Other  neg diabetes  Renal/GU negative Renal ROS     Musculoskeletal   Abdominal   Peds  Hematology   Anesthesia Other Findings   Reproductive/Obstetrics                             Anesthesia Physical Anesthesia Plan  ASA: III  Anesthesia Plan: General   Post-op Pain Management:    Induction: Intravenous  PONV Risk Score and Plan: Propofol infusion  Airway Management Planned:   Additional Equipment:   Intra-op Plan:   Post-operative Plan:   Informed Consent: I have reviewed the patients History and Physical, chart, labs and discussed the procedure including the risks, benefits and alternatives for the proposed anesthesia with the patient or authorized representative who has indicated his/her understanding and acceptance.     Plan Discussed with:   Anesthesia Plan Comments:         Anesthesia Quick Evaluation

## 2016-12-10 ENCOUNTER — Encounter: Payer: Self-pay | Admitting: Internal Medicine

## 2016-12-10 LAB — SURGICAL PATHOLOGY

## 2016-12-16 ENCOUNTER — Inpatient Hospital Stay: Payer: Medicare Other | Attending: Hematology and Oncology

## 2016-12-16 ENCOUNTER — Other Ambulatory Visit: Payer: Self-pay | Admitting: Hematology and Oncology

## 2016-12-16 VITALS — BP 148/82 | HR 92 | Resp 18

## 2016-12-16 DIAGNOSIS — Z9841 Cataract extraction status, right eye: Secondary | ICD-10-CM | POA: Diagnosis not present

## 2016-12-16 DIAGNOSIS — M069 Rheumatoid arthritis, unspecified: Secondary | ICD-10-CM | POA: Insufficient documentation

## 2016-12-16 DIAGNOSIS — Z87891 Personal history of nicotine dependence: Secondary | ICD-10-CM | POA: Diagnosis not present

## 2016-12-16 DIAGNOSIS — Z79899 Other long term (current) drug therapy: Secondary | ICD-10-CM | POA: Insufficient documentation

## 2016-12-16 DIAGNOSIS — D509 Iron deficiency anemia, unspecified: Secondary | ICD-10-CM | POA: Diagnosis present

## 2016-12-16 DIAGNOSIS — K449 Diaphragmatic hernia without obstruction or gangrene: Secondary | ICD-10-CM | POA: Diagnosis not present

## 2016-12-16 DIAGNOSIS — E538 Deficiency of other specified B group vitamins: Secondary | ICD-10-CM | POA: Insufficient documentation

## 2016-12-16 DIAGNOSIS — K219 Gastro-esophageal reflux disease without esophagitis: Secondary | ICD-10-CM | POA: Diagnosis not present

## 2016-12-16 MED ORDER — IRON SUCROSE 20 MG/ML IV SOLN
200.0000 mg | Freq: Once | INTRAVENOUS | Status: AC
  Administered 2016-12-16: 200 mg via INTRAVENOUS
  Filled 2016-12-16: qty 10

## 2016-12-16 MED ORDER — SODIUM CHLORIDE 0.9 % IV SOLN
Freq: Once | INTRAVENOUS | Status: AC
  Administered 2016-12-16: 11:00:00 via INTRAVENOUS
  Filled 2016-12-16: qty 1000

## 2016-12-24 ENCOUNTER — Inpatient Hospital Stay: Payer: Medicare Other

## 2016-12-24 ENCOUNTER — Ambulatory Visit: Payer: Medicare Other

## 2016-12-24 ENCOUNTER — Other Ambulatory Visit: Payer: Medicare Other

## 2016-12-24 VITALS — BP 138/78 | HR 98 | Temp 97.7°F | Resp 18

## 2016-12-24 DIAGNOSIS — D509 Iron deficiency anemia, unspecified: Secondary | ICD-10-CM | POA: Diagnosis not present

## 2016-12-24 DIAGNOSIS — E538 Deficiency of other specified B group vitamins: Secondary | ICD-10-CM

## 2016-12-24 MED ORDER — IRON SUCROSE 20 MG/ML IV SOLN
200.0000 mg | Freq: Once | INTRAVENOUS | Status: AC
  Administered 2016-12-24: 200 mg via INTRAVENOUS
  Filled 2016-12-24: qty 10

## 2016-12-24 MED ORDER — IRON SUCROSE 20 MG/ML IV SOLN
200.0000 mg | Freq: Once | INTRAVENOUS | Status: DC
  Filled 2016-12-24: qty 10

## 2016-12-24 MED ORDER — CYANOCOBALAMIN 1000 MCG/ML IJ SOLN
1000.0000 ug | Freq: Once | INTRAMUSCULAR | Status: AC
  Administered 2016-12-24: 1000 ug via INTRAMUSCULAR
  Filled 2016-12-24: qty 1

## 2016-12-24 MED ORDER — SODIUM CHLORIDE 0.9 % IV SOLN
Freq: Once | INTRAVENOUS | Status: AC
  Administered 2016-12-24: 11:00:00 via INTRAVENOUS
  Filled 2016-12-24: qty 1000

## 2017-01-25 ENCOUNTER — Inpatient Hospital Stay: Payer: Medicare Other | Attending: Hematology and Oncology

## 2017-01-25 DIAGNOSIS — D509 Iron deficiency anemia, unspecified: Secondary | ICD-10-CM | POA: Diagnosis present

## 2017-01-25 DIAGNOSIS — K449 Diaphragmatic hernia without obstruction or gangrene: Secondary | ICD-10-CM | POA: Insufficient documentation

## 2017-01-25 DIAGNOSIS — Z79899 Other long term (current) drug therapy: Secondary | ICD-10-CM | POA: Diagnosis not present

## 2017-01-25 DIAGNOSIS — E538 Deficiency of other specified B group vitamins: Secondary | ICD-10-CM | POA: Diagnosis not present

## 2017-01-25 DIAGNOSIS — M069 Rheumatoid arthritis, unspecified: Secondary | ICD-10-CM | POA: Insufficient documentation

## 2017-01-25 DIAGNOSIS — K219 Gastro-esophageal reflux disease without esophagitis: Secondary | ICD-10-CM | POA: Diagnosis not present

## 2017-01-25 DIAGNOSIS — Z9841 Cataract extraction status, right eye: Secondary | ICD-10-CM | POA: Diagnosis not present

## 2017-01-25 DIAGNOSIS — Z87891 Personal history of nicotine dependence: Secondary | ICD-10-CM | POA: Insufficient documentation

## 2017-01-25 MED ORDER — CYANOCOBALAMIN 1000 MCG/ML IJ SOLN
1000.0000 ug | Freq: Once | INTRAMUSCULAR | Status: AC
  Administered 2017-01-25: 1000 ug via INTRAMUSCULAR

## 2017-02-28 NOTE — Progress Notes (Signed)
[facility] day:  03/01/2017  Chief Complaint: Ashley Mack is a 71 y.o. female with iron deficiency anemia and B12 deficiency who is seen for 6 month assessment and continuation of B12.  HPI:  The patient was last seen in the hematology clinic on 11/26/2016.  At that time, patient felt "ok", however noted chronic fatigue and exertional dyspnea. She denies bleeding. Patient was maintaining an iron rich diet.  WBC 13.5 with an [place] of 10,700. Hemoglobin 11.0, hematocrit 32.9, MCV 84.4, MCH 28.2, and platelets 475,000. Ferritin had dropped to 16 (previously 66). She was scheduled for weekly Venofer infusions x 3. Colonoscopy was scheduled in 02/2017.  Patient received Venofer 200 mg IV x 3 infusions (12/02/2016, 12/16/2016, and 12/24/2016). She has also continued on monthly B12 injections as ordered; last 01/25/2017.  EGD and colonoscopy was performed on 12/09/2016 by [facility] gastroenterologist, Dr. Alice Reichert. EGD was normal, other than demonstrating a small hiatal hernia. The colonoscopy demonstrated friability with contact bleeding in the proximal transverse colon. A 1 mm sessile polyp was found in the sigmoid colon, in addition to grade II non-bleeding internal hemorrhoids were also noted. The polyp was removed and biopsied. Pathology results demonstrated a tubular adenoma that was negative for dyplasia or malignancy. 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   Symptomatically, patient is doing "just fine". Patient with no acute concerns today. She notes chronic fatigue and exertional shortness of breath. Patient in a hurry today, as she has an appointment with another provider immediately following her visit here today. Patient denies any bruising or bleeding. She has not appreciated any blood in her stools, however her ability to recognize obvious GI blood loss is hindered by her poor vision. Patient attempting to maintain an iron rich diet; eats meat  and green leafy vegetables on a regular basis. Her weight is up 1 pound since her last clinic visit.   Patient denies pain today.   Past Medical History:  Diagnosis Date  . Anemia   . Arthritis    Rheumatoid  . Atony of gallbladder   . Centrilobular emphysema ([place])   . Dyspnea   . Fatty liver   . GERD (gastroesophageal reflux disease)   . History of fractured vertebra    sees chiropractor  . 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 Iron (Fe) deficiency anemia   . Migraine headache    in past  . Migraines   . Motion sickness   . 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 Osteoporosis   . Rheumatoid arthritis ([place])   . Vertigo     Past Surgical History:  Procedure Laterality Date  . CATARACT EXTRACTION W/PHACO Right 08/12/2016   Procedure: CATARACT EXTRACTION PHACO AND INTRAOCULAR LENS PLACEMENT ([place])  Right;  Surgeon: Leandrew Koyanagi, MD;  Location: [facility];  Service: Ophthalmology;  Laterality: Right;  . CATARACT EXTRACTION W/PHACO Left 09/30/2016   Procedure: CATARACT EXTRACTION PHACO AND INTRAOCULAR LENS PLACEMENT ([place]) Left;  Surgeon: Leandrew Koyanagi, MD;  Location: [facility];  Service: Ophthalmology;  Laterality: Left;  . COLONOSCOPY    . COLONOSCOPY WITH PROPOFOL N/A 12/09/2016   Procedure: COLONOSCOPY WITH PROPOFOL;  Surgeon: Toledo, Benay Pike, MD;  Location: [facility];  Service: Endoscopy;  Laterality: N/A;  . ESOPHAGOGASTRODUODENOSCOPY    . ESOPHAGOGASTRODUODENOSCOPY (EGD) WITH PROPOFOL N/A 12/09/2016   Procedure: ESOPHAGOGASTRODUODENOSCOPY (EGD) WITH PROPOFOL;  Surgeon: Toledo, Benay Pike, MD;  Location: [facility] ENDOS[facility];  Service: Endoscopy;  Laterality:  N/A;  . OOPHORECTOMY Bilateral 2003    Family History  Problem Relation Age of Onset  . Kidney disease Mother   . Diabetes Mellitus II Brother     Social History:  reports that she quit smoking about 9 years ago. she has never used smokeless tobacco. She reports that she does not  drink alcohol or use drugs.  She quit smoking in 2009.  She lives in [place].  The patient is accompanied by [employer], her step daughter, today.  Allergies:  Allergies  Allergen Reactions  . Hydrocodone-Acetaminophen Nausea And Vomiting    Dizziness  . Meloxicam Nausea And Vomiting  . Other Nausea Only    Arthritis medications   . Oxycodone     Dizzy, foggy feeling  . Promethazine Hcl Other (See Comments)  . Propoxyphene Nausea Only  . Ranitidine Hives  . Sulfasalazine Other (See Comments)    Stomach upset  . Topiramate Nausea Only    Current Medications: Current Outpatient Medications  Medication Sig Dispense Refill  . Budesonide-Formoterol Fumarate (SYMBICORT IN) Inhale into the lungs 2 (two) times daily.    . Cholecalciferol (VITAMIN D3) 2000 UNITS capsule Take 1 capsule by mouth daily.    . Cyanocobalamin (VITAMIN B-12 IJ) Inject as directed every 30 (thirty) days.    Marland Kitchen DEXILANT 60 MG capsule Take 1 capsule by mouth daily.    . Fe Fum-FePoly-Vit C-Vit B3 (INTEGRA) 62.5-62.5-40-3 MG CAPS Take 1 capsule by mouth daily.    . fluticasone (FLONASE) 50 MCG/ACT nasal spray Place into both nostrils daily as needed for allergies or rhinitis.    . predniSONE (DELTASONE) 10 MG tablet Take 1 tablet by mouth daily.    . naproxen sodium (ANAPROX) 220 MG tablet Take 220 mg by mouth as needed.     No current facility-administered medications for this visit.     Review of Systems:  GENERAL:  Fatigued.  No fevers or sweats.  Weight up 1 pound. PERFORMANCE STATUS (ECOG):  1 HEENT:  Poor vision despite cataract surgery.  No runny nose, sore throat, mouth sores or tenderness. Lungs: Shortness of breath, improved.  No cough.  No hemoptysis. Cardiac:  No chest pain, palpitations, orthopnea, or PND. GI:  No nausea, vomiting, diarrhea, constipation, melena or hematochezia. GU:  No urgency, frequency, dysuria, or hematuria. Musculoskeletal:  Back pain with 5 disk bulges (sees a chiropractor).   Arthritis.  No muscle tenderness. Extremities:  No pain or swelling. Skin:  No rashes or skin changes. Neuro:  Occasional migraine headache for years.  No numbness or weakness, balance or coordination issues. Endocrine:  No diabetes, thyroid issues, hot flashes or night sweats. Psych:  No mood changes, depression or anxiety. Pain:  No focal pain. Review of systems:  All other systems reviewed and found to be negative.  Physical Exam: Blood pressure (!) 145/78, pulse 63, temperature 97.8 F (36.6 C), temperature source Tympanic, resp. rate 20, weight 156 lb 3 oz (70.8 kg), SpO2 97 %. GENERAL:  Well developed, well nourished, woman sitting comfortably in the exam room in no acute distress. MENTAL STATUS:  Alert and oriented to person, place and time. HEAD:  Short brown hair.  Normocephalic, atraumatic, face symmetric, no Cushingoid features. EYES:  Glasses.  Blue eyes.  s/p right cataract surgery.  Pupils equal round and reactive to light and accomodation.  No conjunctivitis or scleral icterus. ENT:  Oropharynx clear without lesion.  Tongue normal.  No teeth.  Mucous membranes moist.  RESPIRATORY:  Clear to auscultation  without rales, wheezes or rhonchi. CARDIOVASCULAR:  Regular rate and rhythm without murmur, rub or gallop. ABDOMEN:  Soft, non-tender, with active bowel sounds, and no hepatosplenomegaly.  No masses. SKIN:  No rashes, ulcers or lesions. EXTREMITIES: No edema, no skin discoloration or tenderness.  No palpable cords. LYMPH NODES: No palpable cervical, supraclavicular, axillary or inguinal adenopathy  NEUROLOGICAL: Unremarkable. PSYCH:  Appropriate.   Clinical Support on 03/01/2017  Component Date Value Ref Range Status  . Ferritin 03/01/2017 34  11 - 307 ng/mL Final  . WBC 03/01/2017 14.5* 3.6 - 11.0 K/uL Final  . RBC 03/01/2017 4.18  3.80 - 5.20 MIL/uL Final  . Hemoglobin 03/01/2017 11.9* 12.0 - 16.0 g/dL Final  . HCT 03/01/2017 36.7  35.0 - 47.0 % Final  . MCV  03/01/2017 87.8  80.0 - 100.0 fL Final  . MCH 03/01/2017 28.5  26.0 - 34.0 pg Final  . MCHC 03/01/2017 32.5  32.0 - 36.0 g/dL Final  . RDW 03/01/2017 16.9* 11.5 - 14.5 % Final  . Platelets 03/01/2017 438  150 - 440 K/uL Final  . Neutrophils Relative % 03/01/2017 88  % Final  . Neutro Abs 03/01/2017 13.0* 1.4 - 6.5 K/uL Final  . Lymphocytes Relative 03/01/2017 8  % Final  . Lymphs Abs 03/01/2017 1.1  1.0 - 3.6 K/uL Final  . Monocytes Relative 03/01/2017 3  % Final  . Monocytes Absolute 03/01/2017 0.4  0.2 - 0.9 K/uL Final  . Eosinophils Relative 03/01/2017 0  % Final  . Eosinophils Absolute 03/01/2017 0.0  0 - 0.7 K/uL Final  . Basophils Relative 03/01/2017 1  % Final  . Basophils Absolute 03/01/2017 0.1  0 - 0.1 K/uL Final    Assessment:  Ashley Mack is a 71 y.o. female with iron deficiency anemia and B12 deficiency.  Diet is good.  She was on oral iron (Integra) for years.  She is unsure of any melena or hematochezia as she can not see well.    Work-up on 08/31/2016 revealed a hematocrit 30.6, hemoglobin 9.9, and MCV 83.2.  B12 was 90.  Ferritin was 13.  Sed rate was 45 (elevated).  Retic was 2.6%.  Normal studies included the following:  Coombs, folate, iron saturation, TIBC, SPEP, and free light chain ratio.  Normal studies on 07/29/2016 included: creatinine, calcium, albumin, protein, liver function tests, and TSH.  She had a colonoscopy on 03/18/2009. She has had several EGDs with the last on 10/12/2008.  She states that she has had polyps, reflux and a hiatal hernia. EGD on 12/09/2016 was normal, other than demonstrating a small hiatal hernia. Colonoscopy on 12/09/2016 demonstrated friability with contact bleeding in the proximal transverse colon. A 1 mm sessile polyp was found in the sigmoid colon, in addition to grade II non-bleeding internal hemorrhoids were also noted. Pathology results demonstrated a tubular adenoma that was negative for dyplasia or malignancy. There was marked  active ischemic colitis.   She began weekly B12 on 09/08/2016 and continued x 6 weeks (last 10/16/2016). She began monthly B12 on 11/26/2016 (last 01/25/2017).  She received Venofer on 09/08/2016, 09/15/2016, 09/25/2016, 11/26/2016, 12/16/2016, and 12/24/2016.   She has rheumatoid arthritis.  She has been treated with methotrexate, Imuran, leflunomide, and sulfasalazine.  She is on prednisone 5 mg a day x 2 years.  Symptomatically, she remains fatigued.  She is exertionally short of breath, however she denies a recent increase in her symptoms.  Exam is stable.  Hemoglobin 11.9, hematocrit 36.7, MCV 87.8, MCH 28.5,  and platelets 438,000. WBC chronically elevated due to corticosteroid use; 14.5 today. Ferritin pending at time of clinic visit.   Plan: 1.  Labs today: CBC with diff, ferritin. 2.  B12 today, then monthly x 3 3.  Call patient with lab results. Anticipate additional Venofer if ferritin remains low. 4.  RTC in 3 months for provider assessment, labs (CBC with diff, ferritin - the day before), and B12.   ADDENDUM: Ferritin remains low at 34. CBC reveals a normal hemoglobin, hematocrit, and RBC indices. As patient remains significantly fatigued, we will plan on additional Venofer 200 mg infusions x 2.   Honor Loh, NP  03/01/2017, 1:17 PM

## 2017-03-01 ENCOUNTER — Inpatient Hospital Stay: Payer: Medicare Other | Attending: Hematology and Oncology | Admitting: Urgent Care

## 2017-03-01 ENCOUNTER — Inpatient Hospital Stay: Payer: Medicare Other

## 2017-03-01 ENCOUNTER — Other Ambulatory Visit: Payer: Self-pay | Admitting: *Deleted

## 2017-03-01 VITALS — BP 145/78 | HR 63 | Temp 97.8°F | Resp 20 | Wt 156.2 lb

## 2017-03-01 DIAGNOSIS — E538 Deficiency of other specified B group vitamins: Secondary | ICD-10-CM

## 2017-03-01 DIAGNOSIS — Z9841 Cataract extraction status, right eye: Secondary | ICD-10-CM | POA: Diagnosis not present

## 2017-03-01 DIAGNOSIS — M069 Rheumatoid arthritis, unspecified: Secondary | ICD-10-CM | POA: Insufficient documentation

## 2017-03-01 DIAGNOSIS — K648 Other hemorrhoids: Secondary | ICD-10-CM | POA: Diagnosis not present

## 2017-03-01 DIAGNOSIS — Z9842 Cataract extraction status, left eye: Secondary | ICD-10-CM | POA: Insufficient documentation

## 2017-03-01 DIAGNOSIS — K449 Diaphragmatic hernia without obstruction or gangrene: Secondary | ICD-10-CM | POA: Diagnosis not present

## 2017-03-01 DIAGNOSIS — K219 Gastro-esophageal reflux disease without esophagitis: Secondary | ICD-10-CM | POA: Diagnosis not present

## 2017-03-01 DIAGNOSIS — Z87891 Personal history of nicotine dependence: Secondary | ICD-10-CM | POA: Diagnosis not present

## 2017-03-01 DIAGNOSIS — D509 Iron deficiency anemia, unspecified: Secondary | ICD-10-CM | POA: Diagnosis present

## 2017-03-01 DIAGNOSIS — Z79899 Other long term (current) drug therapy: Secondary | ICD-10-CM | POA: Diagnosis not present

## 2017-03-01 LAB — CBC WITH DIFFERENTIAL/PLATELET
Basophils Absolute: 0.1 10*3/uL (ref 0–0.1)
Basophils Relative: 1 %
Eosinophils Absolute: 0 10*3/uL (ref 0–0.7)
Eosinophils Relative: 0 %
HCT: 36.7 % (ref 35.0–47.0)
Hemoglobin: 11.9 g/dL — ABNORMAL LOW (ref 12.0–16.0)
Lymphocytes Relative: 8 %
Lymphs Abs: 1.1 10*3/uL (ref 1.0–3.6)
MCH: 28.5 pg (ref 26.0–34.0)
MCHC: 32.5 g/dL (ref 32.0–36.0)
MCV: 87.8 fL (ref 80.0–100.0)
Monocytes Absolute: 0.4 10*3/uL (ref 0.2–0.9)
Monocytes Relative: 3 %
Neutro Abs: 13 10*3/uL — ABNORMAL HIGH (ref 1.4–6.5)
Neutrophils Relative %: 88 %
Platelets: 438 10*3/uL (ref 150–440)
RBC: 4.18 MIL/uL (ref 3.80–5.20)
RDW: 16.9 % — ABNORMAL HIGH (ref 11.5–14.5)
WBC: 14.5 10*3/uL — ABNORMAL HIGH (ref 3.6–11.0)

## 2017-03-01 LAB — FERRITIN: Ferritin: 34 ng/mL (ref 11–307)

## 2017-03-01 MED ORDER — CYANOCOBALAMIN 1000 MCG/ML IJ SOLN
1000.0000 ug | Freq: Once | INTRAMUSCULAR | Status: AC
  Administered 2017-03-01: 1000 ug via INTRAMUSCULAR
  Filled 2017-03-01: qty 1

## 2017-03-01 NOTE — Progress Notes (Signed)
Patient continues to feel tired, especially when walking distances.

## 2017-03-02 ENCOUNTER — Other Ambulatory Visit: Payer: Self-pay | Admitting: Physical Medicine and Rehabilitation

## 2017-03-02 DIAGNOSIS — M5412 Radiculopathy, cervical region: Secondary | ICD-10-CM

## 2017-03-11 ENCOUNTER — Ambulatory Visit
Admission: RE | Admit: 2017-03-11 | Discharge: 2017-03-11 | Disposition: A | Discharge status: Home / Self Care | Payer: Medicare Other | Source: Ambulatory Visit | Attending: Physical Medicine and Rehabilitation | Admitting: Physical Medicine and Rehabilitation

## 2017-03-11 DIAGNOSIS — M50222 Other cervical disc displacement at C5-C6 level: Secondary | ICD-10-CM | POA: Insufficient documentation

## 2017-03-11 DIAGNOSIS — M8938 Hypertrophy of bone, other site: Secondary | ICD-10-CM | POA: Diagnosis not present

## 2017-03-11 DIAGNOSIS — M5030 Other cervical disc degeneration, unspecified cervical region: Secondary | ICD-10-CM | POA: Diagnosis not present

## 2017-03-11 DIAGNOSIS — M4854XA Collapsed vertebra, not elsewhere classified, thoracic region, initial encounter for fracture: Secondary | ICD-10-CM | POA: Diagnosis not present

## 2017-03-11 DIAGNOSIS — M5412 Radiculopathy, cervical region: Secondary | ICD-10-CM | POA: Diagnosis present

## 2017-03-11 DIAGNOSIS — M4802 Spinal stenosis, cervical region: Secondary | ICD-10-CM | POA: Diagnosis not present

## 2017-03-29 ENCOUNTER — Ambulatory Visit: Payer: Medicare Other

## 2017-03-30 ENCOUNTER — Ambulatory Visit: Payer: Medicare Other

## 2017-03-31 ENCOUNTER — Inpatient Hospital Stay: Payer: Medicare Other | Attending: Hematology and Oncology

## 2017-03-31 DIAGNOSIS — E538 Deficiency of other specified B group vitamins: Secondary | ICD-10-CM | POA: Insufficient documentation

## 2017-03-31 MED ORDER — CYANOCOBALAMIN 1000 MCG/ML IJ SOLN
1000.0000 ug | Freq: Once | INTRAMUSCULAR | Status: AC
  Administered 2017-03-31: 1000 ug via INTRAMUSCULAR

## 2017-04-26 ENCOUNTER — Ambulatory Visit: Payer: Medicare Other

## 2017-04-27 ENCOUNTER — Ambulatory Visit: Payer: Medicare Other

## 2017-04-28 ENCOUNTER — Inpatient Hospital Stay: Payer: Medicare Other | Attending: Hematology and Oncology

## 2017-04-28 DIAGNOSIS — E538 Deficiency of other specified B group vitamins: Secondary | ICD-10-CM | POA: Insufficient documentation

## 2017-04-28 MED ORDER — CYANOCOBALAMIN 1000 MCG/ML IJ SOLN
1000.0000 ug | Freq: Once | INTRAMUSCULAR | Status: AC
  Administered 2017-04-28: 1000 ug via INTRAMUSCULAR

## 2017-05-06 DIAGNOSIS — Z0000 Encounter for general adult medical examination without abnormal findings: Secondary | ICD-10-CM | POA: Insufficient documentation

## 2017-05-26 ENCOUNTER — Inpatient Hospital Stay: Payer: Medicare Other | Attending: Hematology and Oncology

## 2017-05-26 ENCOUNTER — Inpatient Hospital Stay: Payer: Medicare Other

## 2017-05-26 DIAGNOSIS — M069 Rheumatoid arthritis, unspecified: Secondary | ICD-10-CM | POA: Diagnosis not present

## 2017-05-26 DIAGNOSIS — E538 Deficiency of other specified B group vitamins: Secondary | ICD-10-CM

## 2017-05-26 DIAGNOSIS — D509 Iron deficiency anemia, unspecified: Secondary | ICD-10-CM | POA: Insufficient documentation

## 2017-05-26 DIAGNOSIS — R5382 Chronic fatigue, unspecified: Secondary | ICD-10-CM | POA: Insufficient documentation

## 2017-05-26 DIAGNOSIS — D125 Benign neoplasm of sigmoid colon: Secondary | ICD-10-CM | POA: Insufficient documentation

## 2017-05-26 DIAGNOSIS — R0602 Shortness of breath: Secondary | ICD-10-CM | POA: Insufficient documentation

## 2017-05-26 DIAGNOSIS — Z87891 Personal history of nicotine dependence: Secondary | ICD-10-CM | POA: Insufficient documentation

## 2017-05-26 LAB — CBC WITH DIFFERENTIAL/PLATELET
Basophils Absolute: 0.1 10*3/uL (ref 0–0.1)
Basophils Relative: 1 %
Eosinophils Absolute: 0 10*3/uL (ref 0–0.7)
Eosinophils Relative: 0 %
HCT: 35.4 % (ref 35.0–47.0)
HEMOGLOBIN: 11.7 g/dL — AB (ref 12.0–16.0)
LYMPHS ABS: 1.5 10*3/uL (ref 1.0–3.6)
Lymphocytes Relative: 11 %
MCH: 28.6 pg (ref 26.0–34.0)
MCHC: 33.1 g/dL (ref 32.0–36.0)
MCV: 86.4 fL (ref 80.0–100.0)
MONO ABS: 0.6 10*3/uL (ref 0.2–0.9)
MONOS PCT: 5 %
NEUTROS PCT: 83 %
Neutro Abs: 10.7 10*3/uL — ABNORMAL HIGH (ref 1.4–6.5)
Platelets: 457 10*3/uL — ABNORMAL HIGH (ref 150–440)
RBC: 4.1 MIL/uL (ref 3.80–5.20)
RDW: 16.2 % — ABNORMAL HIGH (ref 11.5–14.5)
WBC: 12.9 10*3/uL — ABNORMAL HIGH (ref 3.6–11.0)

## 2017-05-26 LAB — FERRITIN: Ferritin: 15 ng/mL (ref 11–307)

## 2017-05-26 MED ORDER — CYANOCOBALAMIN 1000 MCG/ML IJ SOLN
1000.0000 ug | Freq: Once | INTRAMUSCULAR | Status: AC
  Administered 2017-05-26: 1000 ug via INTRAMUSCULAR

## 2017-05-27 ENCOUNTER — Ambulatory Visit: Payer: Medicare Other

## 2017-05-27 ENCOUNTER — Inpatient Hospital Stay (HOSPITAL_BASED_OUTPATIENT_CLINIC_OR_DEPARTMENT_OTHER): Payer: Medicare Other | Admitting: Urgent Care

## 2017-05-27 VITALS — BP 194/68 | HR 85 | Temp 98.2°F | Resp 20 | Wt 157.0 lb

## 2017-05-27 VITALS — BP 140/60 | HR 85 | Temp 97.9°F | Resp 20

## 2017-05-27 DIAGNOSIS — R5383 Other fatigue: Secondary | ICD-10-CM | POA: Diagnosis not present

## 2017-05-27 DIAGNOSIS — D509 Iron deficiency anemia, unspecified: Secondary | ICD-10-CM | POA: Diagnosis not present

## 2017-05-27 DIAGNOSIS — E538 Deficiency of other specified B group vitamins: Secondary | ICD-10-CM

## 2017-05-27 DIAGNOSIS — R0609 Other forms of dyspnea: Secondary | ICD-10-CM

## 2017-05-27 MED ORDER — IRON SUCROSE 20 MG/ML IV SOLN
200.0000 mg | Freq: Once | INTRAVENOUS | Status: AC
  Administered 2017-05-27: 200 mg via INTRAVENOUS
  Filled 2017-05-27: qty 10

## 2017-05-27 MED ORDER — SODIUM CHLORIDE 0.9 % IV SOLN
Freq: Once | INTRAVENOUS | Status: AC
  Administered 2017-05-27: 14:00:00 via INTRAVENOUS
  Filled 2017-05-27: qty 1000

## 2017-05-27 NOTE — Progress Notes (Addendum)
[facility] day:  05/27/2017  Chief Complaint: Ashley Mack is a 72 y.o. female with iron deficiency anemia and B12 deficiency who is seen for 3 month assessment and continuation of B12.  HPI:  The patient was last seen in the hematology clinic on 03/01/2017  At that time, patient was doing "just fine".  She verbalized no acute concerns.  Continues to report chronic fatigue and exertional shortness of breath.  Exam was stable.  WBC 14,500 with an [place] of 13,000.  Hemoglobin was 11.9, hematocrit 36.7, MCV 87.8, and platelets 438,000.  Patient received B12 injection.  Ferritin results were pending at the time of patient's clinic visit. Ferritin resulted as low at 34.  Patient was scheduled for Venofer 200 mg IV x2, however patient never returned for the infusions.  Patient continued on monthly B12 injections as ordered; last 05/26/2017.  Repeat labs were done on 05/26/2017.  WBC 12.9 with an [place] of 2700.  Hemoglobin was 11.7, hematocrit 35.4, MCV 86.4, and platelets 457,000.  Ferritin low at 15..  Symptomatically, patient notes increasing fatigue. She has been more short of breath as of late as well. Shortness of breath is associated with exertion only. Patient denies ice pica and restless leg symptoms. Patient eats "ok", however does not consistently eat mean and green leafy vegetables. Weight has increased by 1 pound since her last visit to the clinic.   Patient denies pain in the clinic today.    Past Medical History:  Diagnosis Date  . Anemia   . Arthritis    Rheumatoid  . Atony of gallbladder   . Centrilobular emphysema ([place])   . Dyspnea   . Fatty liver   . GERD (gastroesophageal reflux disease)   . History of fractured vertebra    sees chiropractor  . 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 Iron (Fe) deficiency anemia   . Migraine headache    in past  . Migraines   . Motion sickness   . 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  Osteoporosis   . Rheumatoid arthritis ([place])   . Vertigo     Past Surgical History:  Procedure Laterality Date  . CATARACT EXTRACTION W/PHACO Right 08/12/2016   Procedure: CATARACT EXTRACTION PHACO AND INTRAOCULAR LENS PLACEMENT ([place])  Right;  Surgeon: Leandrew Koyanagi, MD;  Location: [facility];  Service: Ophthalmology;  Laterality: Right;  . CATARACT EXTRACTION W/PHACO Left 09/30/2016   Procedure: CATARACT EXTRACTION PHACO AND INTRAOCULAR LENS PLACEMENT ([place]) Left;  Surgeon: Leandrew Koyanagi, MD;  Location: [facility];  Service: Ophthalmology;  Laterality: Left;  . COLONOSCOPY    . COLONOSCOPY WITH PROPOFOL N/A 12/09/2016   Procedure: COLONOSCOPY WITH PROPOFOL;  Surgeon: Toledo, Benay Pike, MD;  Location: [facility];  Service: Endoscopy;  Laterality: N/A;  . ESOPHAGOGASTRODUODENOSCOPY    . ESOPHAGOGASTRODUODENOSCOPY (EGD) WITH PROPOFOL N/A 12/09/2016   Procedure: ESOPHAGOGASTRODUODENOSCOPY (EGD) WITH PROPOFOL;  Surgeon: Toledo, Benay Pike, MD;  Location: [facility];  Service: Endoscopy;  Laterality: N/A;  . OOPHORECTOMY Bilateral 2003    Family History  Problem Relation Age of Onset  . Kidney disease Mother   . Diabetes Mellitus II Brother     Social History:  reports that she quit smoking about 10 years ago. she has never used smokeless tobacco. She reports that she does not drink alcohol or use drugs.  She quit smoking in 2009.  She lives in [place].  The patient is accompanied by  A[employer], her step daughter, today.  Allergies:  Allergies  Allergen Reactions  . Hydrocodone-Acetaminophen Nausea And Vomiting    Dizziness  . Meloxicam Nausea And Vomiting  . Other Nausea Only    Arthritis medications   . Oxycodone     Dizzy, foggy feeling  . Promethazine Hcl Other (See Comments)  . Propoxyphene Nausea Only  . Ranitidine Hives  . Sulfasalazine Other (See Comments)    Stomach upset  . Topiramate Nausea Only    Current Medications: Current  Outpatient Medications  Medication Sig Dispense Refill  . Budesonide-Formoterol Fumarate (SYMBICORT IN) Inhale into the lungs 2 (two) times daily.    . Cholecalciferol (VITAMIN D3) 2000 UNITS capsule Take 1 capsule by mouth daily.    . Cyanocobalamin (VITAMIN B-12 IJ) Inject as directed every 30 (thirty) days.    Marland Kitchen DEXILANT 60 MG capsule Take 1 capsule by mouth daily.    . Fe Fum-FePoly-Vit C-Vit B3 (INTEGRA) 62.5-62.5-40-3 MG CAPS Take 1 capsule by mouth daily.    . fluticasone (FLONASE) 50 MCG/ACT nasal spray Place into both nostrils daily as needed for allergies or rhinitis.    . naproxen sodium (ANAPROX) 220 MG tablet Take 220 mg by mouth as needed.    . predniSONE (DELTASONE) 10 MG tablet Take 1 tablet by mouth daily.     No current facility-administered medications for this visit.     Review of Systems:  GENERAL:  Fatigued.  No fevers or sweats.  Weight up 1 pound. PERFORMANCE STATUS (ECOG):  1 HEENT:  Poor vision despite cataract surgery.  No runny nose, sore throat, mouth sores or tenderness. Lungs: Shortness of breath, increased.  No cough.  No hemoptysis. Cardiac:  No chest pain, palpitations, orthopnea, or PND. GI:  No nausea, vomiting, diarrhea, constipation, melena or hematochezia. GU:  No urgency, frequency, dysuria, or hematuria. Musculoskeletal:  Back pain with 5 disk bulges (sees a chiropractor).  Arthritis.  No muscle tenderness. Extremities:  No pain or swelling. Skin:  No rashes or skin changes. Neuro:  Occasional migraine headache for years.  No numbness or weakness, balance or coordination issues. Endocrine:  No diabetes, thyroid issues, hot flashes or night sweats. Psych:  No mood changes, depression or anxiety. Pain:  No focal pain. Review of systems:  All other systems reviewed and found to be negative.  Physical Exam: Blood pressure (!) 194/68, pulse 85, temperature 98.2 F (36.8 C), temperature source Tympanic, resp. rate 20, weight 157 lb (71.2 kg), SpO2  96 %. GENERAL:  Well developed, well nourished, woman sitting comfortably in the exam room in no acute distress. MENTAL STATUS:  Alert and oriented to person, place and time. HEAD:  Short brown hair.  Normocephalic, atraumatic, face symmetric, no Cushingoid features. EYES:  Glasses.  Blue eyes.  s/p right cataract surgery.  Pupils equal round and reactive to light and accomodation.  No conjunctivitis or scleral icterus. ENT:  Oropharynx clear without lesion.  Tongue normal.  No teeth.  Mucous membranes moist.  RESPIRATORY:  Clear to auscultation without rales, wheezes or rhonchi. CARDIOVASCULAR:  Regular rate and rhythm without murmur, rub or gallop. ABDOMEN:  Soft, non-tender, with active bowel sounds, and no hepatosplenomegaly.  No masses. SKIN:  No rashes, ulcers or lesions. EXTREMITIES: No edema, no skin discoloration or tenderness.  No palpable cords. LYMPH NODES: No palpable cervical, supraclavicular, axillary or inguinal adenopathy  NEUROLOGICAL: Unremarkable. PSYCH:  Appropriate.   Appointment on 05/26/2017  Component Date Value Ref Range Status  . WBC 05/26/2017  12.9* 3.6 - 11.0 K/uL Final  . RBC 05/26/2017 4.10  3.80 - 5.20 MIL/uL Final  . Hemoglobin 05/26/2017 11.7* 12.0 - 16.0 g/dL Final  . HCT 05/26/2017 35.4  35.0 - 47.0 % Final  . MCV 05/26/2017 86.4  80.0 - 100.0 fL Final  . MCH 05/26/2017 28.6  26.0 - 34.0 pg Final  . MCHC 05/26/2017 33.1  32.0 - 36.0 g/dL Final  . RDW 05/26/2017 16.2* 11.5 - 14.5 % Final  . Platelets 05/26/2017 457* 150 - 440 K/uL Final  . Neutrophils Relative % 05/26/2017 83  % Final  . Neutro Abs 05/26/2017 10.7* 1.4 - 6.5 K/uL Final  . Lymphocytes Relative 05/26/2017 11  % Final  . Lymphs Abs 05/26/2017 1.5  1.0 - 3.6 K/uL Final  . Monocytes Relative 05/26/2017 5  % Final  . Monocytes Absolute 05/26/2017 0.6  0.2 - 0.9 K/uL Final  . Eosinophils Relative 05/26/2017 0  % Final  . Eosinophils Absolute 05/26/2017 0.0  0 - 0.7 K/uL Final  .  Basophils Relative 05/26/2017 1  % Final  . Basophils Absolute 05/26/2017 0.1  0 - 0.1 K/uL Final   Performed at [facility], [address], [place], [place] [address]  . Ferritin 05/26/2017 15  11 - 307 ng/mL Final   Performed at [facility], [address] [place], [place] [address]    Assessment:  SHARISSE RANTZ is a 72 y.o. female with iron deficiency anemia and B12 deficiency.  Diet is good.  She was on oral iron (Integra) for years.  She is unsure of any melena or hematochezia as she can not see well.    Work-up on 08/31/2016 revealed a hematocrit 30.6, hemoglobin 9.9, and MCV 83.2.  B12 was 90.  Ferritin was 13.  Sed rate was 45 (elevated).  Retic was 2.6%.  Normal studies included the following:  Coombs, folate, iron saturation, TIBC, SPEP, and free light chain ratio.  Normal studies on 07/29/2016 included: creatinine, calcium, albumin, protein, liver function tests, and TSH.  She had a colonoscopy on 03/18/2009. She has had several EGDs with the last on 10/12/2008.  She states that she has had polyps, reflux and a hiatal hernia. EGD on 12/09/2016 was normal, other than demonstrating a small hiatal hernia. Colonoscopy on 12/09/2016 demonstrated friability with contact bleeding in the proximal transverse colon. A 1 mm sessile polyp was found in the sigmoid colon, in addition to grade II non-bleeding internal hemorrhoids were also noted. Pathology results demonstrated a tubular adenoma that was negative for dyplasia or malignancy. 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   She began weekly B12 on 09/08/2016 and continued x 6 weeks (last 10/16/2016). She began monthly B12 on 11/26/2016 (last 05/26/2017).  She received Venofer on 09/08/2016, 09/15/2016, 09/25/2016, 11/26/2016, 12/16/2016, and 12/24/2016.   She has rheumatoid arthritis.  She has been treated with methotrexate, Imuran, leflunomide, and sulfasalazine.  She is on prednisone 5 mg a day x 2  years.  Symptomatically, she remains fatigued.  She is exertionally short of breath, and confirms a recent increase in her symptoms.  Exam is stable.  Hemoglobin 11.7, hematocrit 35.4, MCV 86.4, MCH 28.6, and platelets 457,000. WBC chronically elevated due to corticosteroid use; 12.9 today. Ferritin low at 15.   Plan: 1.  Review labs from 05/26/2017.  Hemoglobin and hematocrit stable.  RBC indices normal.   2.  Discuss iron stores.  Ferritin has continued to decrease to 15.  Discussed need for weekly Venofer 200  mg x 3 infusions. Patient agrees with plan. Will receive first Venofer dose today. Despite chair being held, infusion advising that there is no room. NP to place PIV and administer dose in clinical area.  3.  Continue monthly B12 injections x 3. 4.  RTC in 6 weeks for labs (CBC with differential, ferritin) 5.  RTC in 3 months for provider assessment, labs (CBC with diff, ferritin - the day before), and B12.   Honor Loh, NP  05/27/2017, 8:00 PM

## 2017-05-29 DIAGNOSIS — R5383 Other fatigue: Secondary | ICD-10-CM | POA: Insufficient documentation

## 2017-05-29 DIAGNOSIS — R0609 Other forms of dyspnea: Secondary | ICD-10-CM

## 2017-06-03 ENCOUNTER — Inpatient Hospital Stay: Payer: Medicare Other

## 2017-06-03 VITALS — BP 175/92 | HR 84 | Temp 98.4°F | Resp 18

## 2017-06-03 DIAGNOSIS — D509 Iron deficiency anemia, unspecified: Secondary | ICD-10-CM | POA: Diagnosis not present

## 2017-06-03 DIAGNOSIS — E538 Deficiency of other specified B group vitamins: Secondary | ICD-10-CM

## 2017-06-03 MED ORDER — IRON SUCROSE 20 MG/ML IV SOLN
200.0000 mg | Freq: Once | INTRAVENOUS | Status: AC
  Administered 2017-06-03: 200 mg via INTRAVENOUS
  Filled 2017-06-03: qty 10

## 2017-06-03 MED ORDER — SODIUM CHLORIDE 0.9 % IV SOLN
Freq: Once | INTRAVENOUS | Status: AC
  Administered 2017-06-03: 14:00:00 via INTRAVENOUS
  Filled 2017-06-03: qty 1000

## 2017-06-10 ENCOUNTER — Inpatient Hospital Stay: Payer: Medicare Other

## 2017-06-10 VITALS — BP 117/83 | HR 87 | Temp 96.0°F

## 2017-06-10 DIAGNOSIS — D509 Iron deficiency anemia, unspecified: Secondary | ICD-10-CM | POA: Diagnosis not present

## 2017-06-10 DIAGNOSIS — E538 Deficiency of other specified B group vitamins: Secondary | ICD-10-CM

## 2017-06-10 MED ORDER — SODIUM CHLORIDE 0.9 % IV SOLN
Freq: Once | INTRAVENOUS | Status: AC
  Administered 2017-06-10: 14:00:00 via INTRAVENOUS
  Filled 2017-06-10: qty 1000

## 2017-06-10 MED ORDER — IRON SUCROSE 20 MG/ML IV SOLN
200.0000 mg | Freq: Once | INTRAVENOUS | Status: AC
  Administered 2017-06-10: 200 mg via INTRAVENOUS
  Filled 2017-06-10: qty 10

## 2017-06-10 NOTE — Progress Notes (Signed)
Unable to obtain B/P with automatic cuff after two attempts (one attempt on each arm). Pt does not want it retaken, stating that the B/P cuff hurts. Pt stable at discharge.

## 2017-06-12 ENCOUNTER — Other Ambulatory Visit: Payer: Self-pay

## 2017-06-12 ENCOUNTER — Encounter: Payer: Self-pay | Admitting: Emergency Medicine

## 2017-06-12 ENCOUNTER — Emergency Department
Admission: EM | Admit: 2017-06-12 | Discharge: 2017-06-13 | Disposition: A | Discharge status: Home / Self Care | Payer: Medicare Other | Attending: Emergency Medicine | Admitting: Emergency Medicine

## 2017-06-12 ENCOUNTER — Emergency Department: Payer: Medicare Other

## 2017-06-12 DIAGNOSIS — Z79899 Other long term (current) drug therapy: Secondary | ICD-10-CM | POA: Diagnosis not present

## 2017-06-12 DIAGNOSIS — Z87891 Personal history of nicotine dependence: Secondary | ICD-10-CM | POA: Insufficient documentation

## 2017-06-12 DIAGNOSIS — M25511 Pain in right shoulder: Secondary | ICD-10-CM | POA: Insufficient documentation

## 2017-06-12 DIAGNOSIS — S61411A Laceration without foreign body of right hand, initial encounter: Secondary | ICD-10-CM | POA: Diagnosis not present

## 2017-06-12 DIAGNOSIS — Y999 Unspecified external cause status: Secondary | ICD-10-CM | POA: Insufficient documentation

## 2017-06-12 DIAGNOSIS — W108XXA Fall (on) (from) other stairs and steps, initial encounter: Secondary | ICD-10-CM | POA: Insufficient documentation

## 2017-06-12 DIAGNOSIS — Y9301 Activity, walking, marching and hiking: Secondary | ICD-10-CM | POA: Insufficient documentation

## 2017-06-12 DIAGNOSIS — Z23 Encounter for immunization: Secondary | ICD-10-CM | POA: Diagnosis not present

## 2017-06-12 DIAGNOSIS — Y92009 Unspecified place in unspecified non-institutional (private) residence as the place of occurrence of the external cause: Secondary | ICD-10-CM | POA: Insufficient documentation

## 2017-06-12 DIAGNOSIS — S6991XA Unspecified injury of right wrist, hand and finger(s), initial encounter: Secondary | ICD-10-CM | POA: Diagnosis present

## 2017-06-12 MED ORDER — MORPHINE SULFATE (PF) 4 MG/ML IV SOLN
4.0000 mg | Freq: Once | INTRAVENOUS | Status: AC
  Administered 2017-06-12: 4 mg via INTRAMUSCULAR
  Filled 2017-06-12: qty 1

## 2017-06-12 MED ORDER — TETANUS-DIPHTH-ACELL PERTUSSIS 5-2.5-18.5 LF-MCG/0.5 IM SUSP
INTRAMUSCULAR | Status: AC
  Administered 2017-06-12: 0.5 mL via INTRAMUSCULAR
  Filled 2017-06-12: qty 0.5

## 2017-06-12 MED ORDER — CEPHALEXIN 250 MG PO CAPS: 250.0000 mg | ORAL_CAPSULE | Freq: Two times a day (BID) | ORAL | 0 refills | Status: AC

## 2017-06-12 MED ORDER — LIDOCAINE HCL (PF) 1 % IJ SOLN
INTRAMUSCULAR | Status: AC
  Filled 2017-06-12: qty 5

## 2017-06-12 MED ORDER — TETANUS-DIPHTH-ACELL PERTUSSIS 5-2.5-18.5 LF-MCG/0.5 IM SUSP
0.5000 mL | Freq: Once | INTRAMUSCULAR | Status: AC
  Administered 2017-06-12: 0.5 mL via INTRAMUSCULAR

## 2017-06-12 MED ORDER — ONDANSETRON HCL 4 MG/2ML IJ SOLN
4.0000 mg | Freq: Once | INTRAMUSCULAR | Status: AC
  Administered 2017-06-12: 4 mg via INTRAMUSCULAR
  Filled 2017-06-12: qty 2

## 2017-06-12 NOTE — ED Triage Notes (Signed)
Pt says she was going outside at her house, took one step off porch and missed the last 2; fell onto her knees and then right hand landed on large rock; pt has a large deep laceration to right palm that extends up in Y formation between both fingers; pt able to move all fingers but with increased pain; pt also c/o pain to right upper back, right shoulder and right upper arm; all areas very tender on palpation; pain to areas increased with any movement;  pt also has bruising below left knee and to inner left ankle; pt says she was assisted to standing by a neighbor that saw her fall and was able to ambulate into her house;

## 2017-06-12 NOTE — ED Notes (Signed)
Pt has laceration to rt hand - numbed by dr for sutures. No fractures noted on xr but pt c/o rt shoulder pain from trying to catch herself when falling. Hx of compression fx for which she see ortho and receives "shots".

## 2017-06-12 NOTE — Discharge Instructions (Addendum)
Please call the number provided for orthopedics to arrange a follow-up appointment as soon as possible for further evaluation.  Please take your antibiotic as prescribed for its entire course.  Return to the emergency department for any signs of infection such as fever, increased pain, redness or pus to the area.  Please keep the bandage in place for the next 48 hours, at which time he may remove the bandage, wash with warm water and a gentle soap and air dry, then replaced the bandage.  Please wear your sling for comfort until you can be seen by orthopedics for further evaluation.

## 2017-06-12 NOTE — ED Notes (Signed)
Lidocaine obtained for dr. Kerman Passey

## 2017-06-12 NOTE — ED Provider Notes (Signed)
[facility] Emergency Department Provider Note  Time seen: 10:19 PM  I have reviewed the triage vital signs and the nursing notes.   HISTORY  Chief Complaint Fall; Laceration; Shoulder Pain; Arm Pain; and Leg Pain    HPI Ashley Mack is a 72 y.o. female with a past medical history of anemia, gastric reflux, vertigo, presents to the emergency department after a fall.  According to the patient she was walking down her steps when she slipped on a step falling forward approximately 2 steps onto the ground.  Denies hitting her head.  Denies LOC.  Patient landed on a landscaping stone with her extended right hand suffering a large laceration to the right hand.  Also with pain in her right shoulder, left ankle, left knee.  Patient denies any nausea or vomiting.  Denies LOC.  Past Medical History:  Diagnosis Date  . Anemia   . Arthritis    Rheumatoid  . Atony of gallbladder   . Centrilobular emphysema ([place])   . Dyspnea   . Fatty liver   . GERD (gastroesophageal reflux disease)   . History of fractured vertebra    sees chiropractor  . 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 Iron (Fe) deficiency anemia   . Migraine headache    in past  . Migraines   . Motion sickness   . 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 Osteoporosis   . Rheumatoid arthritis ([place])   . Vertigo     Patient Active Problem List   Diagnosis Date Noted  . Fatigue 05/29/2017  . Exertional dyspnea 05/29/2017  . Guaiac positive stools 11/26/2016  . Iron deficiency anemia 09/01/2016  . B12 deficiency 08/31/2016  . Normocytic anemia 08/30/2016    Past Surgical History:  Procedure Laterality Date  . CATARACT EXTRACTION W/PHACO Right 08/12/2016   Procedure: CATARACT EXTRACTION PHACO AND INTRAOCULAR LENS PLACEMENT ([place])  Right;  Surgeon: Leandrew Koyanagi, MD;  Location: [place];  Service: Ophthalmology;  Laterality: Right;  . CATARACT EXTRACTION W/PHACO Left 09/30/2016    Procedure: CATARACT EXTRACTION PHACO AND INTRAOCULAR LENS PLACEMENT ([place]) Left;  Surgeon: Leandrew Koyanagi, MD;  Location: [place];  Service: Ophthalmology;  Laterality: Left;  . COLONOSCOPY    . COLONOSCOPY WITH PROPOFOL N/A 12/09/2016   Procedure: COLONOSCOPY WITH PROPOFOL;  Surgeon: Toledo, Benay Pike, MD;  Location: [facility];  Service: Endoscopy;  Laterality: N/A;  . ESOPHAGOGASTRODUODENOSCOPY    . ESOPHAGOGASTRODUODENOSCOPY (EGD) WITH PROPOFOL N/A 12/09/2016   Procedure: ESOPHAGOGASTRODUODENOSCOPY (EGD) WITH PROPOFOL;  Surgeon: Toledo, Benay Pike, MD;  Location: [facility];  Service: Endoscopy;  Laterality: N/A;  . OOPHORECTOMY Bilateral 2003    Prior to Admission medications   Medication Sig Start Date End Date Taking? Authorizing Provider  Budesonide-Formoterol Fumarate (SYMBICORT IN) Inhale into the lungs 2 (two) times daily.    [provider]  Cholecalciferol (VITAMIN D3) 2000 UNITS capsule Take 1 capsule by mouth daily.    [provider]  Cyanocobalamin (VITAMIN B-12 IJ) Inject as directed every 30 (thirty) days.    [provider]  DEXILANT 60 MG capsule Take 1 capsule by mouth daily. 08/04/14   [provider]  Fe Fum-FePoly-Vit C-Vit B3 (INTEGRA) 62.5-62.5-40-3 MG CAPS Take 1 capsule by mouth daily. 11/29/13   [provider]  fluticasone (FLONASE) 50 MCG/ACT nasal spray Place into both nostrils daily as needed for allergies or rhinitis.    [provider]  naproxen sodium (ANAPROX) 220 MG  tablet Take 220 mg by mouth as needed.    [provider]  predniSONE (DELTASONE) 10 MG tablet Take 1 tablet by mouth daily. 09/13/14   [provider]    Allergies  Allergen Reactions  . Hydrocodone-Acetaminophen Nausea And Vomiting    Dizziness  . Meloxicam Nausea And Vomiting  . Other Nausea Only    Arthritis medications   . Oxycodone     Dizzy, foggy feeling  . Promethazine Hcl Other (See  Comments)  . Propoxyphene Nausea Only  . Ranitidine Hives  . Sulfasalazine Other (See Comments)    Stomach upset  . Topiramate Nausea Only    Family History  Problem Relation Age of Onset  . Kidney disease Mother   . Diabetes Mellitus II Brother     Social History Social History   Tobacco Use  . Smoking status: Former Smoker    Last attempt to quit: 2009    Years since quitting: 10.2  . Smokeless tobacco: Never Used  Substance Use Topics  . Alcohol use: No  . Drug use: No    Review of Systems Constitutional: Negative for loss of consciousness Eyes: Negative for visual complaints ENT: Negative for facial injury Cardiovascular: Negative for chest pain. Respiratory: Negative for shortness of breath. Gastrointestinal: Negative for abdominal pain, vomiting Genitourinary: Negative for urinary compaints Musculoskeletal: Moderate right shoulder pain, mild left knee pain, ankle pain, right hand pain Skin: Laceration to right hand. Neurological: Negative for headache All other ROS negative  ____________________________________________   PHYSICAL EXAM:  VITAL SIGNS: ED Triage Vitals  Enc Vitals Group     BP 06/12/17 1942 (!) 176/118     Pulse Rate 06/12/17 1942 78     Resp 06/12/17 1942 19     Temp 06/12/17 1942 98 F (36.7 C)     Temp Source 06/12/17 1942 Oral     SpO2 06/12/17 1942 96 %     Weight 06/12/17 1947 157 lb (71.2 kg)     Height 06/12/17 1947 5\' 2"  (1.575 m)     Head Circumference --      Peak Flow --      Pain Score 06/12/17 1946 10     Pain Loc --      Pain Edu? --      Excl. in [place]? --     Constitutional: Alert and oriented. Well appearing and in no distress. Eyes: Normal exam ENT   Head: Normocephalic and atraumatic.   Mouth/Throat: Mucous membranes are moist. Cardiovascular: Normal rate, regular rhythm. Respiratory: Normal respiratory effort without tachypnea nor retractions. Breath sounds are clear Gastrointestinal: Soft and  nontender. No distention.  Musculoskeletal: Moderate right shoulder tenderness to palpation, good range of motion but with pain.  Mild left knee ecchymosis, with mild pain with range of motion and palpation, mild pain in the left ankle range of motion, no edema or ecchymosis noted.  Patient does have a incision, Y-shaped to the palmar aspect of her right hand.  Patient states good sensation in digits 1-4 and 5, states slightly decreased sensation in digit 3, but with good capillary refill and the patient is able to discriminate touch but states it is slightly more numb than her remaining digits.  Laceration is fairly macerated, but all in all appears clean.  Hemostatic. Neurologic:  Normal speech and language. No gross focal neurologic deficits Skin:  Skin is warm, laceration to palmar aspect of right hand as described above.  Hemostatic. Psychiatric: Mood and affect are normal. Speech  and behavior are normal.   ____________________________________________   RADIOLOGY  X-rays are negative for acute abnormality.  ____________________________________________   INITIAL IMPRESSION / ASSESSMENT AND PLAN / ED COURSE  Pertinent labs & imaging results that were available during my care of the patient were reviewed by me and considered in my medical decision making (see chart for details).  Patient presents to the emergency department after mechanical fall down her steps landing on her outstretched right hand suffering a laceration to the right hand, pain to her right shoulder, left knee, left ankle.  Overall the patient appears well, no distress, lying in bed comfortably.  Does have pain/tenderness in her right shoulder left knee and left ankle.  X-rays have resulted negative for acute bony abnormality.  However given the pain with range of motion in the right shoulder there could be a rotator cuff injury.  Patient will require laceration repair to the right hand, once the laceration is repaired we will  place in a sling and have the patient follow-up with orthopedics ultimately.  Overall the patient appears very well, denies hitting her head, denies LOC.  No C-spine tenderness.  We will ensure that the patient is able to ambulate prior to discharge.  Patient states she has been able to ambulate at home as well as the emergency department without issue thus far.  Laceration repaired by myself.  Extensively irrigated, no foreign bodies identified.  LACERATION REPAIR Performed by: Harvest Dark Authorized by: Harvest Dark Consent: Verbal consent obtained. Risks and benefits: risks, benefits and alternatives were discussed Consent given by: patient Patient identity confirmed: provided demographic data Prepped and Draped in normal sterile fashion Wound explored  Laceration Location: right palm  Laceration Length: 6cm  No Foreign Bodies seen or palpated  Anesthesia: local infiltration  Local anesthetic: lidocaine 1% wo epinephrine  Anesthetic total: 15 ml  Irrigation method: syringe Amount of cleaning: standard  Skin closure: 4-0 prolene  Number of sutures: 13  Technique: simple interrupted  Patient tolerance: Patient tolerated the procedure well with no immediate complications.   Updated the patient's tetanus shot in the emergency department.  We will have the patient follow-up with orthopedics for further evaluation.  Given that the cut was on a rock we will cover with antibiotics as a precaution.  ____________________________________________   FINAL CLINICAL IMPRESSION(S) / ED DIAGNOSES  Fall Laceration Contusions Shoulder pain    Harvest Dark, MD 06/12/17 2341

## 2017-06-24 ENCOUNTER — Inpatient Hospital Stay: Payer: Medicare Other | Attending: Hematology and Oncology

## 2017-06-24 DIAGNOSIS — D509 Iron deficiency anemia, unspecified: Secondary | ICD-10-CM | POA: Insufficient documentation

## 2017-06-24 DIAGNOSIS — E538 Deficiency of other specified B group vitamins: Secondary | ICD-10-CM | POA: Diagnosis present

## 2017-06-24 MED ORDER — CYANOCOBALAMIN 1000 MCG/ML IJ SOLN
1000.0000 ug | Freq: Once | INTRAMUSCULAR | Status: AC
Start: 2017-06-24 — End: 2017-06-24
  Administered 2017-06-24: 1000 ug via INTRAMUSCULAR

## 2017-07-08 ENCOUNTER — Inpatient Hospital Stay: Payer: Medicare Other

## 2017-07-08 DIAGNOSIS — D509 Iron deficiency anemia, unspecified: Secondary | ICD-10-CM

## 2017-07-08 DIAGNOSIS — E538 Deficiency of other specified B group vitamins: Secondary | ICD-10-CM | POA: Diagnosis not present

## 2017-07-08 LAB — CBC WITH DIFFERENTIAL/PLATELET
Basophils Absolute: 0.1 10*3/uL (ref 0–0.1)
Basophils Relative: 1 %
EOS ABS: 0 10*3/uL (ref 0–0.7)
Eosinophils Relative: 0 %
HEMATOCRIT: 35.2 % (ref 35.0–47.0)
HEMOGLOBIN: 11.8 g/dL — AB (ref 12.0–16.0)
LYMPHS ABS: 1.3 10*3/uL (ref 1.0–3.6)
LYMPHS PCT: 14 %
MCH: 29.7 pg (ref 26.0–34.0)
MCHC: 33.6 g/dL (ref 32.0–36.0)
MCV: 88.2 fL (ref 80.0–100.0)
MONOS PCT: 4 %
Monocytes Absolute: 0.4 10*3/uL (ref 0.2–0.9)
NEUTROS ABS: 8 10*3/uL — AB (ref 1.4–6.5)
NEUTROS PCT: 81 %
Platelets: 376 10*3/uL (ref 150–440)
RBC: 3.99 MIL/uL (ref 3.80–5.20)
RDW: 17.2 % — ABNORMAL HIGH (ref 11.5–14.5)
WBC: 9.8 10*3/uL (ref 3.6–11.0)

## 2017-07-08 LAB — FERRITIN: Ferritin: 59 ng/mL (ref 11–307)

## 2017-07-22 ENCOUNTER — Inpatient Hospital Stay: Payer: Medicare Other | Attending: Hematology and Oncology

## 2017-07-22 DIAGNOSIS — E538 Deficiency of other specified B group vitamins: Secondary | ICD-10-CM | POA: Insufficient documentation

## 2017-07-22 MED ORDER — CYANOCOBALAMIN 1000 MCG/ML IJ SOLN
1000.0000 ug | Freq: Once | INTRAMUSCULAR | Status: AC
  Administered 2017-07-22: 1000 ug via INTRAMUSCULAR

## 2017-08-06 ENCOUNTER — Encounter: Payer: Self-pay | Admitting: Emergency Medicine

## 2017-08-06 ENCOUNTER — Emergency Department: Payer: Medicare Other

## 2017-08-06 ENCOUNTER — Other Ambulatory Visit: Payer: Self-pay

## 2017-08-06 ENCOUNTER — Observation Stay (HOSPITAL_BASED_OUTPATIENT_CLINIC_OR_DEPARTMENT_OTHER)
Admission: EM | Admit: 2017-08-06 | Discharge: 2017-08-07 | Disposition: A | Discharge status: Home / Self Care | Payer: Medicare Other | Source: Home / Self Care | Attending: Emergency Medicine | Admitting: Emergency Medicine

## 2017-08-06 DIAGNOSIS — Z7952 Long term (current) use of systemic steroids: Secondary | ICD-10-CM

## 2017-08-06 DIAGNOSIS — M069 Rheumatoid arthritis, unspecified: Secondary | ICD-10-CM | POA: Insufficient documentation

## 2017-08-06 DIAGNOSIS — Z7951 Long term (current) use of inhaled steroids: Secondary | ICD-10-CM | POA: Insufficient documentation

## 2017-08-06 DIAGNOSIS — R109 Unspecified abdominal pain: Secondary | ICD-10-CM

## 2017-08-06 DIAGNOSIS — K388 Other specified diseases of appendix: Secondary | ICD-10-CM

## 2017-08-06 DIAGNOSIS — D72829 Elevated white blood cell count, unspecified: Secondary | ICD-10-CM

## 2017-08-06 DIAGNOSIS — K8020 Calculus of gallbladder without cholecystitis without obstruction: Secondary | ICD-10-CM

## 2017-08-06 DIAGNOSIS — J432 Centrilobular emphysema: Secondary | ICD-10-CM

## 2017-08-06 DIAGNOSIS — M4856XA Collapsed vertebra, not elsewhere classified, lumbar region, initial encounter for fracture: Secondary | ICD-10-CM | POA: Diagnosis not present

## 2017-08-06 DIAGNOSIS — D509 Iron deficiency anemia, unspecified: Secondary | ICD-10-CM | POA: Insufficient documentation

## 2017-08-06 DIAGNOSIS — R1011 Right upper quadrant pain: Secondary | ICD-10-CM | POA: Diagnosis present

## 2017-08-06 DIAGNOSIS — I700 Atherosclerosis of aorta: Secondary | ICD-10-CM | POA: Insufficient documentation

## 2017-08-06 DIAGNOSIS — Z79899 Other long term (current) drug therapy: Secondary | ICD-10-CM | POA: Insufficient documentation

## 2017-08-06 DIAGNOSIS — K219 Gastro-esophageal reflux disease without esophagitis: Secondary | ICD-10-CM

## 2017-08-06 DIAGNOSIS — K3589 Other acute appendicitis without perforation or gangrene: Secondary | ICD-10-CM

## 2017-08-06 DIAGNOSIS — M439 Deforming dorsopathy, unspecified: Secondary | ICD-10-CM

## 2017-08-06 DIAGNOSIS — Z87891 Personal history of nicotine dependence: Secondary | ICD-10-CM

## 2017-08-06 DIAGNOSIS — M549 Dorsalgia, unspecified: Secondary | ICD-10-CM | POA: Diagnosis not present

## 2017-08-06 LAB — CBC WITH DIFFERENTIAL/PLATELET
BASOS ABS: 0.1 10*3/uL (ref 0–0.1)
Basophils Relative: 1 %
Eosinophils Absolute: 0 10*3/uL (ref 0–0.7)
Eosinophils Relative: 0 %
HEMATOCRIT: 38.6 % (ref 35.0–47.0)
Hemoglobin: 12.8 g/dL (ref 12.0–16.0)
LYMPHS PCT: 5 %
Lymphs Abs: 0.6 10*3/uL — ABNORMAL LOW (ref 1.0–3.6)
MCH: 29 pg (ref 26.0–34.0)
MCHC: 33.2 g/dL (ref 32.0–36.0)
MCV: 87.5 fL (ref 80.0–100.0)
MONO ABS: 0.2 10*3/uL (ref 0.2–0.9)
MONOS PCT: 2 %
NEUTROS ABS: 11.9 10*3/uL — AB (ref 1.4–6.5)
Neutrophils Relative %: 92 %
Platelets: 388 10*3/uL (ref 150–440)
RBC: 4.41 MIL/uL (ref 3.80–5.20)
RDW: 16.1 % — AB (ref 11.5–14.5)
WBC: 12.8 10*3/uL — ABNORMAL HIGH (ref 3.6–11.0)

## 2017-08-06 LAB — URINALYSIS, COMPLETE (UACMP) WITH MICROSCOPIC
BACTERIA UA: NONE SEEN
BILIRUBIN URINE: NEGATIVE
GLUCOSE, UA: NEGATIVE mg/dL
KETONES UR: 20 mg/dL — AB
Leukocytes, UA: NEGATIVE
NITRITE: NEGATIVE
PROTEIN: NEGATIVE mg/dL
Specific Gravity, Urine: 1.004 — ABNORMAL LOW (ref 1.005–1.030)
pH: 7 (ref 5.0–8.0)

## 2017-08-06 LAB — COMPREHENSIVE METABOLIC PANEL
ALT: 17 U/L (ref 14–54)
AST: 22 U/L (ref 15–41)
Albumin: 4.3 g/dL (ref 3.5–5.0)
Alkaline Phosphatase: 82 U/L (ref 38–126)
Anion gap: 11 (ref 5–15)
BILIRUBIN TOTAL: 0.7 mg/dL (ref 0.3–1.2)
BUN: 11 mg/dL (ref 6–20)
CALCIUM: 9 mg/dL (ref 8.9–10.3)
CO2: 26 mmol/L (ref 22–32)
Chloride: 98 mmol/L — ABNORMAL LOW (ref 101–111)
Creatinine, Ser: 0.68 mg/dL (ref 0.44–1.00)
GFR calc non Af Amer: >60 mL/min (ref >60)
Glucose, Bld: 127 mg/dL — ABNORMAL HIGH (ref 65–99)
POTASSIUM: 3.4 mmol/L — AB (ref 3.5–5.1)
Sodium: 135 mmol/L (ref 135–145)
Total Protein: 7.3 g/dL (ref 6.5–8.1)

## 2017-08-06 LAB — LIPASE, BLOOD: Lipase: 34 U/L (ref 11–51)

## 2017-08-06 MED ORDER — PANTOPRAZOLE SODIUM 40 MG PO TBEC: 40.0000 mg | DELAYED_RELEASE_TABLET | Freq: Every day | ORAL | Status: DC

## 2017-08-06 MED ORDER — MOMETASONE FURO-FORMOTEROL FUM 100-5 MCG/ACT IN AERO
2.0000 | INHALATION_SPRAY | Freq: Two times a day (BID) | RESPIRATORY_TRACT | Status: DC
Start: 2017-08-06 — End: 2017-08-07
  Filled 2017-08-06: qty 8.8

## 2017-08-06 MED ORDER — HYDRALAZINE HCL 20 MG/ML IJ SOLN: 10.0000 mg | INTRAMUSCULAR | Status: DC | PRN

## 2017-08-06 MED ORDER — KCL IN DEXTROSE-NACL 20-5-0.45 MEQ/L-%-% IV SOLN
INTRAVENOUS | Status: DC
  Administered 2017-08-06: 21:00:00 via INTRAVENOUS
  Filled 2017-08-06 (×5): qty 1000

## 2017-08-06 MED ORDER — MORPHINE SULFATE (PF) 4 MG/ML IV SOLN
4.0000 mg | Freq: Once | INTRAVENOUS | Status: AC
  Administered 2017-08-06: 4 mg via INTRAVENOUS
  Filled 2017-08-06: qty 1

## 2017-08-06 MED ORDER — ONDANSETRON HCL 4 MG/2ML IJ SOLN
4.0000 mg | Freq: Four times a day (QID) | INTRAMUSCULAR | Status: DC | PRN
  Administered 2017-08-06: 4 mg via INTRAVENOUS
  Filled 2017-08-06: qty 2

## 2017-08-06 MED ORDER — HEPARIN SODIUM (PORCINE) 5000 UNIT/ML IJ SOLN
5000.0000 [IU] | Freq: Three times a day (TID) | INTRAMUSCULAR | Status: DC
  Filled 2017-08-06: qty 1

## 2017-08-06 MED ORDER — METRONIDAZOLE IN NACL 5-0.79 MG/ML-% IV SOLN
500.0000 mg | Freq: Once | INTRAVENOUS | Status: AC
  Administered 2017-08-06: 500 mg via INTRAVENOUS
  Filled 2017-08-06: qty 100

## 2017-08-06 MED ORDER — PREDNISONE 2.5 MG PO TABS
5.0000 mg | ORAL_TABLET | Freq: Every day | ORAL | Status: DC
  Filled 2017-08-06 (×2): qty 2

## 2017-08-06 MED ORDER — CEFTRIAXONE SODIUM 1 G IJ SOLR
1.0000 g | Freq: Once | INTRAMUSCULAR | Status: AC
  Administered 2017-08-06: 1 g via INTRAVENOUS
  Filled 2017-08-06: qty 10

## 2017-08-06 MED ORDER — FLUTICASONE PROPIONATE 50 MCG/ACT NA SUSP
1.0000 | Freq: Every day | NASAL | Status: DC | PRN
  Filled 2017-08-06: qty 16

## 2017-08-06 MED ORDER — ONDANSETRON HCL 4 MG/2ML IJ SOLN
4.0000 mg | Freq: Once | INTRAMUSCULAR | Status: AC
  Administered 2017-08-06: 4 mg via INTRAVENOUS
  Filled 2017-08-06: qty 2

## 2017-08-06 NOTE — ED Notes (Signed)
First Nurse Note:  Patient states she is visually impaired.  Complaining of right flank pain, has seen PCP for same.

## 2017-08-06 NOTE — ED Triage Notes (Signed)
Pt to ED via POV c/o right flank pain since Tuesday. Pt states that the pain is getting worse. Pt seen her PCP on Wednesday and had a UA done and was told she may have a kidney stone. Pt in NAD at this time.

## 2017-08-06 NOTE — H&P (Signed)
SURGICAL ADMISSION HISTORY AND PHYSICAL  HISTORY OF PRESENT ILLNESS (HPI):  72 y.o. female presented to [facility] ED today for evaluation of RUQ and Right flank pain x 4 days. Patient reports she first developed this pain Tuesday afternoon, but she does not recall what she ate earlier that day. She was evaluated by her PMD the following morning and initiated workup including UA with hematuria, for which she was referred to ED for CT to evaluate for nephrolithiasis. She says the pain has been worse overnight and in the mornings, but has improved each day. She reports +flatus and +BM WNL without constipation or diarrhea and denies N/V, fever/chills, CP, or SOB except a single episode of clear small-volume non-bloody non-bilious emesis in the ED after having received a dose of morphine. Patient otherwise states she is able to walk around [employer] without any CP, SOB, or difficulty, but says she walks slowly and avoids steps due to multiple falls. When evaluated in the ED, patient states her pain has resolved completely after she received a single dose of morphine 4 1/2 hours prior to evaluation.  Surgery is consulted by ED physician Dr. Maricela Bo in this context for evaluation and management of possible appendicitis.  PAST MEDICAL HISTORY (PMH):  Past Medical History:  Diagnosis Date  . Anemia   . Arthritis    Rheumatoid  . Atony of gallbladder   . Centrilobular emphysema ([place])   . Dyspnea   . Fatty liver   . GERD (gastroesophageal reflux disease)   . History of fractured vertebra    sees chiropractor  . 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 Iron (Fe) deficiency anemia   . Migraine headache    in past  . Migraines   . Motion sickness   . 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 Osteoporosis   . Rheumatoid arthritis ([place])   . Vertigo      PAST SURGICAL HISTORY ([place]):  Past Surgical History:  Procedure Laterality Date  . CATARACT EXTRACTION W/PHACO Right 08/12/2016   Procedure: CATARACT  EXTRACTION PHACO AND INTRAOCULAR LENS PLACEMENT ([place])  Right;  Surgeon: Leandrew Koyanagi, MD;  Location: [place];  Service: Ophthalmology;  Laterality: Right;  . CATARACT EXTRACTION W/PHACO Left 09/30/2016   Procedure: CATARACT EXTRACTION PHACO AND INTRAOCULAR LENS PLACEMENT ([place]) Left;  Surgeon: Leandrew Koyanagi, MD;  Location: [place];  Service: Ophthalmology;  Laterality: Left;  . COLONOSCOPY    . COLONOSCOPY WITH PROPOFOL N/A 12/09/2016   Procedure: COLONOSCOPY WITH PROPOFOL;  Surgeon: Toledo, Benay Pike, MD;  Location: [facility];  Service: Endoscopy;  Laterality: N/A;  . ESOPHAGOGASTRODUODENOSCOPY    . ESOPHAGOGASTRODUODENOSCOPY (EGD) WITH PROPOFOL N/A 12/09/2016   Procedure: ESOPHAGOGASTRODUODENOSCOPY (EGD) WITH PROPOFOL;  Surgeon: Toledo, Benay Pike, MD;  Location: [facility];  Service: Endoscopy;  Laterality: N/A;  . OOPHORECTOMY Bilateral 2003     MEDICATIONS:  Prior to Admission medications   Medication Sig Start Date End Date Taking? Authorizing Provider  budesonide-formoterol (SYMBICORT) 80-4.5 MCG/ACT inhaler Inhale 2 puffs into the lungs every morning.    Yes [provider]  Cholecalciferol (VITAMIN D3) 2000 UNITS capsule Take 1 capsule by mouth daily.   Yes [provider]  DEXILANT 60 MG capsule Take 1 capsule by mouth daily. 08/04/14  Yes [provider]  Fe Fum-FePoly-Vit C-Vit B3 (INTEGRA) 62.5-62.5-40-3 MG CAPS Take 1 capsule by mouth daily. 11/29/13  Yes [provider]  fluticasone (FLONASE) 50 MCG/ACT nasal spray Place into both nostrils daily as  needed for allergies or rhinitis.   Yes [provider]  naproxen sodium (ANAPROX) 220 MG tablet Take 220 mg by mouth as needed.   Yes [provider]  predniSONE (DELTASONE) 5 MG tablet Take 5 mg by mouth daily.    Yes [provider]     ALLERGIES:  Allergies  Allergen Reactions  . Hydrocodone-Acetaminophen Nausea And Vomiting     Dizziness  . Meloxicam Nausea And Vomiting  . Other Nausea Only    Arthritis medications   . Oxycodone     Dizzy, foggy feeling  . Promethazine Hcl Other (See Comments)  . Propoxyphene Nausea Only  . Ranitidine Hives  . Sulfasalazine Other (See Comments)    Stomach upset  . Topiramate Nausea Only     SOCIAL HISTORY:  Social History   Socioeconomic History  . Marital status: Widowed    Spouse name: Not on file  . Number of children: Not on file  . Years of education: Not on file  . Highest education level: Not on file  Occupational History  . Not on file  Social Needs  . Financial resource strain: Not on file  . Food insecurity:    Worry: Not on file    Inability: Not on file  . Transportation needs:    Medical: Not on file    Non-medical: Not on file  Tobacco Use  . Smoking status: Former Smoker    Last attempt to quit: 2009    Years since quitting: 10.3  . Smokeless tobacco: Never Used  Substance and Sexual Activity  . Alcohol use: No  . Drug use: No  . Sexual activity: Not on file  Lifestyle  . Physical activity:    Days per week: Not on file    Minutes per session: Not on file  . Stress: Not on file  Relationships  . Social connections:    Talks on phone: Not on file    Gets together: Not on file    Attends religious service: Not on file    Active member of club or organization: Not on file    Attends meetings of clubs or organizations: Not on file    Relationship status: Not on file  . Intimate partner violence:    Fear of current or ex partner: Not on file    Emotionally abused: Not on file    Physically abused: Not on file    Forced sexual activity: Not on file  Other Topics Concern  . Not on file  Social History Narrative  . Not on file    The patient currently resides (home / rehab facility / nursing home): Home The patient normally is (ambulatory / bedbound): Ambulatory   FAMILY HISTORY:  Family History  Problem Relation Age of Onset   . Kidney disease Mother   . Diabetes Mellitus II Brother      REVIEW OF SYSTEMS:  Constitutional: denies weight loss, fever, chills, or sweats  Eyes: denies any other vision changes, history of eye injury  ENT: denies sore throat, hearing problems  Respiratory: denies shortness of breath, wheezing  Cardiovascular: denies chest pain, palpitations  Gastrointestinal: abdominal pain, N/V, and bowel function as per HPI Genitourinary: denies burning with urination or urinary frequency Musculoskeletal: denies any other joint pains or cramps  Skin: denies any other rashes or skin discolorations  Neurological: denies any other headache, dizziness, weakness  Psychiatric: denies any other depression, anxiety   All other review of systems were negative  VITAL SIGNS:  Temp:  [98 F (36.7 C)-98.5 F (36.9 C)] 98.5 F (36.9 C) (05/24 1811) Pulse Rate:  [81-102] 88 (05/24 1811) Resp:  [16-18] 18 (05/24 1811) BP: (142-192)/(74-165) 187/91 (05/24 1811) SpO2:  [92 %-97 %] 95 % (05/24 1811) Weight:  [154 lb 1.6 oz (69.9 kg)] 154 lb 1.6 oz (69.9 kg) (05/24 1811)     Height: 5\' 2"  (157.5 cm) Weight: 154 lb 1.6 oz (69.9 kg) BMI (Calculated): 28.18   INTAKE/OUTPUT:  This shift: No intake/output data recorded.  Last 2 shifts: @IOLAST2SHIFTS @   PHYSICAL EXAM:  Constitutional:  -- Normal body habitus  -- Awake, alert, and oriented x3, no apparent distress Eyes:  -- Pupils equally round and reactive to light  -- No scleral icterus, B/L no occular discharge Ear, nose, throat: -- Neck is FROM WNL -- No jugular venous distension  Pulmonary:  -- No wheezes or rhales -- Equal breath sounds bilaterally -- Breathing non-labored at rest Cardiovascular:  -- S1, S2 present  -- No pericardial rubs  Gastrointestinal:  -- Abdomen soft, completely nontender, and non-distended with no guarding or rebound tenderness -- No abdominal masses appreciated, pulsatile or otherwise  Musculoskeletal and  Integumentary:  -- Wounds or skin discoloration: None appreciated -- Extremities: B/L UE and LE FROM, hands and feet warm, no edema  Neurologic:  -- Motor function: Intact and symmetric -- Sensation: Intact and symmetric Psychiatric:  -- Mood and affect WNL  Labs:  CBC Latest Ref Rng & Units 08/06/2017 07/08/2017 05/26/2017  WBC 3.6 - 11.0 K/uL 12.8(H) 9.8 12.9(H)  Hemoglobin 12.0 - 16.0 g/dL 12.8 11.8(L) 11.7(L)  Hematocrit 35.0 - 47.0 % 38.6 35.2 35.4  Platelets 150 - 440 K/uL 388 376 457(H)   CMP Latest Ref Rng & Units 08/06/2017 05/06/2015  Glucose 65 - 99 mg/dL 127(H) 133(H)  BUN 6 - 20 mg/dL 11 8  Creatinine 0.44 - 1.00 mg/dL 0.68 0.67  Sodium 135 - 145 mmol/L 135 131(L)  Potassium 3.5 - 5.1 mmol/L 3.4(L) 3.3(L)  Chloride 101 - 111 mmol/L 98(L) 93(L)  CO2 22 - 32 mmol/L 26 31  Calcium 8.9 - 10.3 mg/dL 9.0 8.0(L)  Total Protein 6.5 - 8.1 g/dL 7.3 -  Total Bilirubin 0.3 - 1.2 mg/dL 0.7 -  Alkaline Phos 38 - 126 U/L 82 -  AST 15 - 41 U/L 22 -  ALT 14 - 54 U/L 17 -   Imaging studies:  CT Abdomen and Pelvis without Contrast (08/06/2017) - personally reviewed and discussed with patient and her daughter No hydronephrosis or renal obstruction is noted. No renal or ureteral calculi are noted.  The appendix appears to be enlarged compared with prior exam, with maximum measured diameter of 9 mm. However, no definite surrounding inflammation is noted. These findings are equivocal for appendicitis and correlation with clinical and laboratory findings is recommended to evaluate for possible acute appendicitis.  Cholelithiasis without inflammation.  Aortic Atherosclerosis.   Assessment/Plan: (ICD-10's: R10.11) 72 y.o. 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   - NPO, IV fluids  - monitor abdominal exam  -  will admit to surgical service  - check repeat CBC tomorrow morning  - discussed with patient and her daughter possibilities of surgery and/or antibiotics as well  - medical management of comorbidities (select home medications)  - DVT prophylaxis, ambulation encouraged  All of the above findings and recommendations were discussed with the patient, her family, and ED physician, and all of patient's and her family's questions were answered to their expressed satisfaction.  Thank you for the opportunity to participate in this patient's care.   -- Marilynne Drivers Rosana Hoes, MD, [place]: [facility] - Partnering for exceptional care. Office: [phone]

## 2017-08-06 NOTE — ED Provider Notes (Signed)
[facility] Emergency Department Provider Note       Time seen: ----------------------------------------- 10:26 AM on 08/06/2017 -----------------------------------------   I have reviewed the triage vital signs and the nursing notes.  HISTORY   Chief Complaint Flank Pain    HPI Ashley Mack is a 72 y.o. female with a history of anemia, arthritis, GERD, migraine headache, rheumatoid arthritis, vertigo who presents to the ED for right flank pain since Tuesday.  Patient states the pain is getting worse, she saw her primary care doctor on Wednesday and had a UA done was told she may have a kidney stone.  She denies fevers, chills or other complaints.  Past Medical History:  Diagnosis Date  . Anemia   . Arthritis    Rheumatoid  . Atony of gallbladder   . Centrilobular emphysema ([place])   . Dyspnea   . Fatty liver   . GERD (gastroesophageal reflux disease)   . History of fractured vertebra    sees chiropractor  . 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 Iron (Fe) deficiency anemia   . Migraine headache    in past  . Migraines   . Motion sickness   . 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 Osteoporosis   . Rheumatoid arthritis ([place])   . Vertigo     Patient Active Problem List   Diagnosis Date Noted  . Fatigue 05/29/2017  . Exertional dyspnea 05/29/2017  . Guaiac positive stools 11/26/2016  . Iron deficiency anemia 09/01/2016  . B12 deficiency 08/31/2016  . Normocytic anemia 08/30/2016    Past Surgical History:  Procedure Laterality Date  . CATARACT EXTRACTION W/PHACO Right 08/12/2016   Procedure: CATARACT EXTRACTION PHACO AND INTRAOCULAR LENS PLACEMENT ([place])  Right;  Surgeon: Leandrew Koyanagi, MD;  Location: [place];  Service: Ophthalmology;  Laterality: Right;  . CATARACT EXTRACTION W/PHACO Left 09/30/2016   Procedure: CATARACT EXTRACTION PHACO AND INTRAOCULAR LENS PLACEMENT ([place]) Left;  Surgeon: Leandrew Koyanagi, MD;   Location: [place];  Service: Ophthalmology;  Laterality: Left;  . COLONOSCOPY    . COLONOSCOPY WITH PROPOFOL N/A 12/09/2016   Procedure: COLONOSCOPY WITH PROPOFOL;  Surgeon: Toledo, Benay Pike, MD;  Location: [facility];  Service: Endoscopy;  Laterality: N/A;  . ESOPHAGOGASTRODUODENOSCOPY    . ESOPHAGOGASTRODUODENOSCOPY (EGD) WITH PROPOFOL N/A 12/09/2016   Procedure: ESOPHAGOGASTRODUODENOSCOPY (EGD) WITH PROPOFOL;  Surgeon: Toledo, Benay Pike, MD;  Location: [facility];  Service: Endoscopy;  Laterality: N/A;  . OOPHORECTOMY Bilateral 2003    Allergies Hydrocodone-acetaminophen; Meloxicam; Other; Oxycodone; Promethazine hcl; Propoxyphene; Ranitidine; Sulfasalazine; and Topiramate  Social History Social History   Tobacco Use  . Smoking status: Former Smoker    Last attempt to quit: 2009    Years since quitting: 10.3  . Smokeless tobacco: Never Used  Substance Use Topics  . Alcohol use: No  . Drug use: No   Review of Systems Constitutional: Negative for fever. Cardiovascular: Negative for chest pain. Respiratory: Negative for shortness of breath. Gastrointestinal: Positive for flank pain Genitourinary: Negative for dysuria. Musculoskeletal: Negative for back pain. Skin: Negative for rash. Neurological: Negative for headaches, focal weakness or numbness.  All systems negative/normal/unremarkable except as stated in the HPI  ____________________________________________   PHYSICAL EXAM:  VITAL SIGNS: ED Triage Vitals  Enc Vitals Group     BP 08/06/17 1022 (!) 172/77     Pulse Rate 08/06/17 1022 (!) 102     Resp 08/06/17 1022 16     Temp 08/06/17 1022 98  F (36.7 C)     Temp Source 08/06/17 1022 Oral     SpO2 08/06/17 1022 93 %     Weight --      Height --      Head Circumference --      Peak Flow --      Pain Score 08/06/17 1023 8     Pain Loc --      Pain Edu? --      Excl. in [place]? --    Constitutional: Alert and oriented. Well appearing and in  no distress. Eyes: Conjunctivae are normal. Normal extraocular movements. Cardiovascular: Normal rate, regular rhythm. No murmurs, rubs, or gallops. Respiratory: Normal respiratory effort without tachypnea nor retractions. Breath sounds are clear and equal bilaterally. No wheezes/rales/rhonchi. Gastrointestinal: Right flank tenderness, no rebound or guarding.  Normal bowel sounds. Musculoskeletal: Nontender with normal range of motion in extremities. No lower extremity tenderness nor edema. Neurologic:  Normal speech and language. No gross focal neurologic deficits are appreciated.  Skin:  Skin is warm, dry and intact. No rash noted. Psychiatric: Mood and affect are normal. Speech and behavior are normal.  ____________________________________________  ED COURSE:  As part of my medical decision making, I reviewed the following data within the [place] History obtained from family if available, nursing notes, old chart and ekg, as well as notes from prior ED visits. Patient presented for right flank pain, we will assess with labs and imaging as indicated at this time.   Procedures ____________________________________________   LABS (pertinent positives/negatives)  Labs Reviewed  CBC WITH DIFFERENTIAL/PLATELET - Abnormal; Notable for the following components:      Result Value   WBC 12.8 (*)    RDW 16.1 (*)    Neutro Abs 11.9 (*)    Lymphs Abs 0.6 (*)    All other components within normal limits  COMPREHENSIVE METABOLIC PANEL - Abnormal; Notable for the following components:   Potassium 3.4 (*)    Chloride 98 (*)    Glucose, Bld 127 (*)    All other components within normal limits  URINALYSIS, COMPLETE (UACMP) WITH MICROSCOPIC - Abnormal; Notable for the following components:   Color, Urine STRAW (*)    APPearance CLEAR (*)    Specific Gravity, Urine 1.004 (*)    Hgb urine dipstick SMALL (*)    Ketones, ur 20 (*)    All other components within normal limits   LIPASE, BLOOD    RADIOLOGY Images were viewed by me  CT renal protocol IMPRESSION: No hydronephrosis or renal obstruction is noted. No renal or ureteral calculi are noted.  The appendix appears to be enlarged compared with prior exam, with maximum measured diameter of 9 mm. However, no definite surrounding inflammation is noted. These findings are equivocal for appendicitis and correlation with clinical and laboratory findings is recommended to evaluate for possible acute appendicitis.  Cholelithiasis without inflammation.  Aortic Atherosclerosis (ICD10-I70.0).  ____________________________________________  DIFFERENTIAL DIAGNOSIS   Renal colic, UTI, pyelonephritis, cholecystitis, appendicitis, musculoskeletal pain  FINAL ASSESSMENT AND PLAN  Right lower quadrant pain   Plan: The patient had presented for right flank pain. Patient's labs do reveal mild leukocytosis. Patient's imaging was equivocal for appendicitis and she does have specific right lower quadrant tenderness.  I will discuss with general surgery for evaluation.   Laurence Aly, MD   Note: This note was generated in part or whole with voice recognition software. Voice recognition is usually quite accurate but there are transcription errors  that can and very often do occur. I apologize for any typographical errors that were not detected and corrected.     Earleen Newport, MD 08/06/17 [phone]

## 2017-08-06 NOTE — ED Notes (Signed)
Dr. Rosana Hoes at bedside with patient.

## 2017-08-07 DIAGNOSIS — R1011 Right upper quadrant pain: Secondary | ICD-10-CM | POA: Diagnosis not present

## 2017-08-07 LAB — COMPREHENSIVE METABOLIC PANEL
ALT: 13 U/L — AB (ref 14–54)
AST: 16 U/L (ref 15–41)
Albumin: 3.5 g/dL (ref 3.5–5.0)
Alkaline Phosphatase: 69 U/L (ref 38–126)
Anion gap: 6 (ref 5–15)
BILIRUBIN TOTAL: 0.6 mg/dL (ref 0.3–1.2)
BUN: 12 mg/dL (ref 6–20)
CO2: 28 mmol/L (ref 22–32)
CREATININE: 0.78 mg/dL (ref 0.44–1.00)
Calcium: 8.4 mg/dL — ABNORMAL LOW (ref 8.9–10.3)
Chloride: 102 mmol/L (ref 101–111)
GFR calc Af Amer: >60 mL/min (ref >60)
Glucose, Bld: 111 mg/dL — ABNORMAL HIGH (ref 65–99)
POTASSIUM: 3.5 mmol/L (ref 3.5–5.1)
Sodium: 136 mmol/L (ref 135–145)
TOTAL PROTEIN: 6.4 g/dL — AB (ref 6.5–8.1)

## 2017-08-07 LAB — CBC
HCT: 34.5 % — ABNORMAL LOW (ref 35.0–47.0)
Hemoglobin: 11.9 g/dL — ABNORMAL LOW (ref 12.0–16.0)
MCH: 30.5 pg (ref 26.0–34.0)
MCHC: 34.6 g/dL (ref 32.0–36.0)
MCV: 88.2 fL (ref 80.0–100.0)
Platelets: 350 10*3/uL (ref 150–440)
RBC: 3.91 MIL/uL (ref 3.80–5.20)
RDW: 16.3 % — AB (ref 11.5–14.5)
WBC: 8.7 10*3/uL (ref 3.6–11.0)

## 2017-08-07 MED ORDER — TRAMADOL-ACETAMINOPHEN 37.5-325 MG PO TABS: 1.0000 | ORAL_TABLET | Freq: Four times a day (QID) | ORAL | 0 refills | Status: DC | PRN

## 2017-08-07 MED ORDER — HYDROMORPHONE HCL 1 MG/ML IJ SOLN
0.5000 mg | Freq: Once | INTRAMUSCULAR | Status: AC
  Administered 2017-08-07: 0.5 mg via INTRAVENOUS
  Filled 2017-08-07: qty 0.5

## 2017-08-07 MED ORDER — TRAMADOL-ACETAMINOPHEN 37.5-325 MG PO TABS
1.0000 | ORAL_TABLET | Freq: Once | ORAL | Status: AC
  Administered 2017-08-07: 1 via ORAL
  Filled 2017-08-07: qty 1

## 2017-08-07 MED ORDER — ONDANSETRON HCL 4 MG PO TABS: 4.0000 mg | ORAL_TABLET | Freq: Every day | ORAL | 1 refills | Status: DC | PRN

## 2017-08-07 NOTE — Discharge Instructions (Signed)
Diet: Resume home heart healthy diet.  Activity: Light activity and walking are encouraged. Do not drive or drink alcohol if taking narcotic pain medications.  Medications: Resume all home medications. For mild to moderate pain: acetaminophen (Tylenol) or ibuprofen/naproxen (if no kidney disease). Combining Tylenol with alcohol can substantially increase your risk of causing liver disease. Narcotic pain medications, if prescribed, can be used for severe pain, though may cause nausea, constipation, and drowsiness. Do not combine Tylenol and Percocet (or similar, such as Ultracet) within a 6 hour period as Percocet (and similar, such as Ultracet) contain Tylenol. If you do not need the narcotic pain medication, you do not need to fill the prescription.  Call office [phone]) at any time if any general surgical questions, abdominal as opposed to back/flank pain, fevers/chills, or other general surgery concerns.

## 2017-08-07 NOTE — Progress Notes (Signed)
Pt. Refused bed alarm and refused help from staff in getting dressed.

## 2017-08-07 NOTE — Progress Notes (Signed)
Discharge order received. Patient is alert and oriented. Vital signs stable . No signs of acute distress. Discharge instructions given. Patient verbalized understanding. No other issues noted at this time.   

## 2017-08-08 ENCOUNTER — Emergency Department: Payer: Medicare Other

## 2017-08-08 ENCOUNTER — Other Ambulatory Visit: Payer: Self-pay

## 2017-08-08 ENCOUNTER — Inpatient Hospital Stay
Admission: EM | Admit: 2017-08-08 | Discharge: 2017-08-11 | DRG: 517 | Disposition: A | Discharge status: Home / Self Care | Payer: Medicare Other | Attending: Family Medicine | Admitting: Family Medicine

## 2017-08-08 ENCOUNTER — Encounter: Payer: Self-pay | Admitting: Internal Medicine

## 2017-08-08 DIAGNOSIS — K760 Fatty (change of) liver, not elsewhere classified: Secondary | ICD-10-CM | POA: Diagnosis present

## 2017-08-08 DIAGNOSIS — M549 Dorsalgia, unspecified: Secondary | ICD-10-CM | POA: Diagnosis present

## 2017-08-08 DIAGNOSIS — Z841 Family history of disorders of kidney and ureter: Secondary | ICD-10-CM | POA: Diagnosis not present

## 2017-08-08 DIAGNOSIS — E538 Deficiency of other specified B group vitamins: Secondary | ICD-10-CM | POA: Diagnosis present

## 2017-08-08 DIAGNOSIS — Z9842 Cataract extraction status, left eye: Secondary | ICD-10-CM

## 2017-08-08 DIAGNOSIS — M069 Rheumatoid arthritis, unspecified: Secondary | ICD-10-CM | POA: Diagnosis present

## 2017-08-08 DIAGNOSIS — J432 Centrilobular emphysema: Secondary | ICD-10-CM | POA: Diagnosis present

## 2017-08-08 DIAGNOSIS — D509 Iron deficiency anemia, unspecified: Secondary | ICD-10-CM | POA: Diagnosis present

## 2017-08-08 DIAGNOSIS — Z9841 Cataract extraction status, right eye: Secondary | ICD-10-CM | POA: Diagnosis not present

## 2017-08-08 DIAGNOSIS — Z7951 Long term (current) use of inhaled steroids: Secondary | ICD-10-CM | POA: Diagnosis not present

## 2017-08-08 DIAGNOSIS — Z419 Encounter for procedure for purposes other than remedying health state, unspecified: Secondary | ICD-10-CM

## 2017-08-08 DIAGNOSIS — M4856XA Collapsed vertebra, not elsewhere classified, lumbar region, initial encounter for fracture: Principal | ICD-10-CM | POA: Diagnosis present

## 2017-08-08 DIAGNOSIS — Z87891 Personal history of nicotine dependence: Secondary | ICD-10-CM | POA: Diagnosis not present

## 2017-08-08 DIAGNOSIS — M4854XG Collapsed vertebra, not elsewhere classified, thoracic region, subsequent encounter for fracture with delayed healing: Secondary | ICD-10-CM | POA: Diagnosis present

## 2017-08-08 DIAGNOSIS — S32000A Wedge compression fracture of unspecified lumbar vertebra, initial encounter for closed fracture: Secondary | ICD-10-CM

## 2017-08-08 DIAGNOSIS — M47816 Spondylosis without myelopathy or radiculopathy, lumbar region: Secondary | ICD-10-CM | POA: Diagnosis present

## 2017-08-08 DIAGNOSIS — Z7952 Long term (current) use of systemic steroids: Secondary | ICD-10-CM

## 2017-08-08 DIAGNOSIS — K219 Gastro-esophageal reflux disease without esophagitis: Secondary | ICD-10-CM | POA: Diagnosis present

## 2017-08-08 DIAGNOSIS — M810 Age-related osteoporosis without current pathological fracture: Secondary | ICD-10-CM | POA: Diagnosis present

## 2017-08-08 DIAGNOSIS — Z833 Family history of diabetes mellitus: Secondary | ICD-10-CM | POA: Diagnosis not present

## 2017-08-08 DIAGNOSIS — Z961 Presence of intraocular lens: Secondary | ICD-10-CM | POA: Diagnosis present

## 2017-08-08 LAB — BASIC METABOLIC PANEL
Anion gap: 9 (ref 5–15)
BUN: 19 mg/dL (ref 6–20)
CHLORIDE: 97 mmol/L — AB (ref 101–111)
CO2: 26 mmol/L (ref 22–32)
CREATININE: 0.81 mg/dL (ref 0.44–1.00)
Calcium: 8.7 mg/dL — ABNORMAL LOW (ref 8.9–10.3)
GFR calc Af Amer: >60 mL/min (ref >60)
Glucose, Bld: 96 mg/dL (ref 65–99)
POTASSIUM: 3.8 mmol/L (ref 3.5–5.1)
SODIUM: 132 mmol/L — AB (ref 135–145)

## 2017-08-08 LAB — CBC
HCT: 36 % (ref 35.0–47.0)
HEMOGLOBIN: 12.5 g/dL (ref 12.0–16.0)
MCH: 30.4 pg (ref 26.0–34.0)
MCHC: 34.8 g/dL (ref 32.0–36.0)
MCV: 87.1 fL (ref 80.0–100.0)
PLATELETS: 337 10*3/uL (ref 150–440)
RBC: 4.13 MIL/uL (ref 3.80–5.20)
RDW: 16.1 % — ABNORMAL HIGH (ref 11.5–14.5)
WBC: 14.5 10*3/uL — ABNORMAL HIGH (ref 3.6–11.0)

## 2017-08-08 MED ORDER — MORPHINE SULFATE (PF) 4 MG/ML IV SOLN
4.0000 mg | Freq: Once | INTRAVENOUS | Status: AC
  Administered 2017-08-08: 4 mg via INTRAVENOUS
  Filled 2017-08-08: qty 1

## 2017-08-08 MED ORDER — ACETAMINOPHEN 650 MG RE SUPP: 650.0000 mg | Freq: Four times a day (QID) | RECTAL | Status: DC | PRN

## 2017-08-08 MED ORDER — INTEGRA 62.5-62.5-40-3 MG PO CAPS: 1.0000 | ORAL_CAPSULE | Freq: Every day | ORAL | Status: DC

## 2017-08-08 MED ORDER — ONDANSETRON HCL 4 MG PO TABS
4.0000 mg | ORAL_TABLET | Freq: Four times a day (QID) | ORAL | Status: DC | PRN
  Administered 2017-08-11: 4 mg via ORAL
  Filled 2017-08-08: qty 1

## 2017-08-08 MED ORDER — CYCLOBENZAPRINE HCL 10 MG PO TABS
10.0000 mg | ORAL_TABLET | Freq: Three times a day (TID) | ORAL | Status: DC
  Administered 2017-08-08 – 2017-08-11 (×8): 10 mg via ORAL
  Filled 2017-08-08 (×8): qty 1

## 2017-08-08 MED ORDER — MOMETASONE FURO-FORMOTEROL FUM 100-5 MCG/ACT IN AERO
2.0000 | INHALATION_SPRAY | Freq: Two times a day (BID) | RESPIRATORY_TRACT | Status: DC
  Administered 2017-08-08 – 2017-08-11 (×5): 2 via RESPIRATORY_TRACT
  Filled 2017-08-08: qty 8.8

## 2017-08-08 MED ORDER — BISACODYL 10 MG RE SUPP: 10.0000 mg | Freq: Every day | RECTAL | Status: DC | PRN

## 2017-08-08 MED ORDER — PREDNISONE 10 MG PO TABS
5.0000 mg | ORAL_TABLET | Freq: Every day | ORAL | Status: DC
  Administered 2017-08-09 – 2017-08-11 (×2): 5 mg via ORAL
  Filled 2017-08-08 (×3): qty 1

## 2017-08-08 MED ORDER — MORPHINE SULFATE (PF) 4 MG/ML IV SOLN
4.0000 mg | Freq: Once | INTRAVENOUS | Status: AC
  Administered 2017-08-08: 4 mg via INTRAVENOUS

## 2017-08-08 MED ORDER — TRAMADOL HCL 50 MG PO TABS
50.0000 mg | ORAL_TABLET | Freq: Four times a day (QID) | ORAL | Status: DC | PRN
  Administered 2017-08-08 – 2017-08-09 (×3): 50 mg via ORAL
  Filled 2017-08-08 (×3): qty 1

## 2017-08-08 MED ORDER — ONDANSETRON HCL 4 MG/2ML IJ SOLN
INTRAMUSCULAR | Status: AC
  Administered 2017-08-08: 4 mg via INTRAVENOUS
  Filled 2017-08-08: qty 2

## 2017-08-08 MED ORDER — HYDROMORPHONE HCL 1 MG/ML IJ SOLN
0.5000 mg | Freq: Once | INTRAMUSCULAR | Status: AC
  Administered 2017-08-08: 0.5 mg via INTRAVENOUS
  Filled 2017-08-08: qty 1

## 2017-08-08 MED ORDER — SODIUM CHLORIDE 0.9 % IV SOLN
INTRAVENOUS | Status: DC
Start: 2017-08-08 — End: 2017-08-11
  Administered 2017-08-08: 16:00:00 via INTRAVENOUS
  Administered 2017-08-09: 75 mL/h via INTRAVENOUS
  Administered 2017-08-10: 40 mL/h via INTRAVENOUS
  Administered 2017-08-11: 06:00:00 via INTRAVENOUS

## 2017-08-08 MED ORDER — ACETAMINOPHEN 325 MG PO TABS: 650.0000 mg | ORAL_TABLET | Freq: Four times a day (QID) | ORAL | Status: DC | PRN

## 2017-08-08 MED ORDER — DOCUSATE SODIUM 100 MG PO CAPS
100.0000 mg | ORAL_CAPSULE | Freq: Two times a day (BID) | ORAL | Status: DC
  Administered 2017-08-08 – 2017-08-10 (×4): 100 mg via ORAL
  Filled 2017-08-08 (×4): qty 1

## 2017-08-08 MED ORDER — PANTOPRAZOLE SODIUM 40 MG PO TBEC
40.0000 mg | DELAYED_RELEASE_TABLET | Freq: Every day | ORAL | Status: DC
  Administered 2017-08-09 – 2017-08-11 (×3): 40 mg via ORAL
  Filled 2017-08-08 (×4): qty 1

## 2017-08-08 MED ORDER — HEPARIN SODIUM (PORCINE) 5000 UNIT/ML IJ SOLN
5000.0000 [IU] | Freq: Three times a day (TID) | INTRAMUSCULAR | Status: DC
  Filled 2017-08-08 (×2): qty 1

## 2017-08-08 MED ORDER — HYDROMORPHONE HCL 1 MG/ML IJ SOLN
1.0000 mg | INTRAMUSCULAR | Status: DC | PRN
  Administered 2017-08-08 – 2017-08-09 (×4): 1 mg via INTRAVENOUS
  Filled 2017-08-08 (×4): qty 1

## 2017-08-08 MED ORDER — FLUTICASONE PROPIONATE 50 MCG/ACT NA SUSP
2.0000 | Freq: Every day | NASAL | Status: DC
  Administered 2017-08-11: 2 via NASAL
  Filled 2017-08-08: qty 16

## 2017-08-08 MED ORDER — ONDANSETRON HCL 4 MG/2ML IJ SOLN
4.0000 mg | Freq: Once | INTRAMUSCULAR | Status: AC
  Administered 2017-08-08: 4 mg via INTRAVENOUS

## 2017-08-08 MED ORDER — CYCLOBENZAPRINE HCL 10 MG PO TABS
5.0000 mg | ORAL_TABLET | Freq: Once | ORAL | Status: AC
  Administered 2017-08-08: 5 mg via ORAL
  Filled 2017-08-08: qty 1

## 2017-08-08 MED ORDER — ONDANSETRON HCL 4 MG/2ML IJ SOLN: 4.0000 mg | Freq: Four times a day (QID) | INTRAMUSCULAR | Status: DC | PRN

## 2017-08-08 MED ORDER — MORPHINE SULFATE (PF) 4 MG/ML IV SOLN
INTRAVENOUS | Status: AC
  Administered 2017-08-08: 4 mg via INTRAVENOUS
  Filled 2017-08-08: qty 1

## 2017-08-08 NOTE — ED Triage Notes (Signed)
Patient reports low back pain.  Reports was discharged from hospital Saturday am and told would need MRI.  At home bent over to pick up shirt and felt a stabbing pain and meds at home not working.

## 2017-08-08 NOTE — Consult Note (Signed)
Reason for Consult: Low Back Pain   Ashley Mack is an 73 y.o. female.  HPI: Ashley Mack presents to the ED today with c/o acute exacerbation of low back pain. LBP increasing over the past week. No new trauma per patient. She has a history of known Lumbar compression fracture recently. Pt denies loss of bowel or bladder.  Denies pain radiation/radicular pain. Denies numbness/tingling. Denies new weakness. Pain location: Lumbar Spine Midline  Past Medical History:  Diagnosis Date  . Anemia   . Arthritis    Rheumatoid  . Atony of gallbladder   . Centrilobular emphysema ([place])   . Dyspnea   . Fatty liver   . GERD (gastroesophageal reflux disease)   . History of fractured vertebra    sees chiropractor  . 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 Iron (Fe) deficiency anemia   . Migraine headache    in past  . Migraines   . Motion sickness   . 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 Osteoporosis   . Rheumatoid arthritis ([place])   . Vertigo     Past Surgical History:  Procedure Laterality Date  . CATARACT EXTRACTION W/PHACO Right 08/12/2016   Procedure: CATARACT EXTRACTION PHACO AND INTRAOCULAR LENS PLACEMENT ([place])  Right;  Surgeon: Leandrew Koyanagi, MD;  Location: [place];  Service: Ophthalmology;  Laterality: Right;  . CATARACT EXTRACTION W/PHACO Left 09/30/2016   Procedure: CATARACT EXTRACTION PHACO AND INTRAOCULAR LENS PLACEMENT ([place]) Left;  Surgeon: Leandrew Koyanagi, MD;  Location: [place];  Service: Ophthalmology;  Laterality: Left;  . COLONOSCOPY    . COLONOSCOPY WITH PROPOFOL N/A 12/09/2016   Procedure: COLONOSCOPY WITH PROPOFOL;  Surgeon: Toledo, Benay Pike, MD;  Location: [facility];  Service: Endoscopy;  Laterality: N/A;  . ESOPHAGOGASTRODUODENOSCOPY    . ESOPHAGOGASTRODUODENOSCOPY (EGD) WITH PROPOFOL N/A 12/09/2016   Procedure: ESOPHAGOGASTRODUODENOSCOPY (EGD) WITH PROPOFOL;  Surgeon: Toledo, Benay Pike, MD;  Location: [facility];  Service:  Endoscopy;  Laterality: N/A;  . OOPHORECTOMY Bilateral 2003    Family History  Problem Relation Age of Onset  . Kidney disease Mother   . Diabetes Mellitus II Brother     Social History:  reports that she quit smoking about 10 years ago. She has never used smokeless tobacco. She reports that she does not drink alcohol or use drugs.  Allergies:  Allergies  Allergen Reactions  . Hydrocodone-Acetaminophen Nausea And Vomiting    Dizziness  . Meloxicam Nausea And Vomiting  . Other Nausea Only    Arthritis medications   . Oxycodone     Dizzy, foggy feeling  . Promethazine Hcl Other (See Comments)  . Propoxyphene Nausea Only  . Ranitidine Hives  . Sulfasalazine Other (See Comments)    Stomach upset  . Topiramate Nausea Only    Medications: I have reviewed the patient's current medications.  Results for orders placed or performed during the hospital encounter of 08/08/17 (from the past 48 hour(s))  CBC     Status: Abnormal   Collection Time: 08/08/17  7:46 AM  Result Value Ref Range   WBC 14.5 (H) 3.6 - 11.0 K/uL   RBC 4.13 3.80 - 5.20 MIL/uL   Hemoglobin 12.5 12.0 - 16.0 g/dL   HCT 36.0 35.0 - 47.0 %   MCV 87.1 80.0 - 100.0 fL   MCH 30.4 26.0 - 34.0 pg   MCHC 34.8 32.0 - 36.0 g/dL   RDW 16.1 (H) 11.5 - 14.5 %   Platelets 337 150 -  440 K/uL    Comment: Performed at [facility], [address] [place], [place] [address]  Basic metabolic panel     Status: Abnormal   Collection Time: 08/08/17  7:46 AM  Result Value Ref Range   Sodium 132 (L) 135 - 145 mmol/L   Potassium 3.8 3.5 - 5.1 mmol/L   Chloride 97 (L) 101 - 111 mmol/L   CO2 26 22 - 32 mmol/L   Glucose, Bld 96 65 - 99 mg/dL   BUN 19 6 - 20 mg/dL   Creatinine, Ser 0.81 0.44 - 1.00 mg/dL   Calcium 8.7 (L) 8.9 - 10.3 mg/dL   GFR calc non Af Amer >60 >60 mL/min   GFR calc Af Amer >60 >60 mL/min    Comment: (NOTE) The eGFR has been calculated using the CKD EPI equation. This calculation has not been  validated in all clinical situations. eGFR's persistently <60 mL/min signify possible Chronic Kidney Disease.    Anion gap 9 5 - 15    Comment: Performed at [facility], [place], [place] [address]    Ct Lumbar Spine Wo Contrast  Result Date: 08/08/2017 CLINICAL DATA:  Low back pain EXAM: CT LUMBAR SPINE WITHOUT CONTRAST TECHNIQUE: Multidetector CT imaging of the lumbar spine was performed without intravenous contrast administration. Multiplanar CT image reconstructions were also generated. COMPARISON:  08/06/2017 and 03/19/2016 FINDINGS: Segmentation: The lowest lumbar type non-rib-bearing vertebra is labeled as L5. Alignment: No vertebral subluxation is observed. Vertebrae: Old 30% T11 and 40% T12 compression fractures are present. There is an acute 35% superior endplate compression fracture at L2 which was not present on 08/06/2017 (2 days ago), and this has 3 mm of associated posterior bony retropulsion. 30% superior endplate compression fracture at L4 is stable from 08/06/2017 but was not present on 03/19/2016, and has sclerosis along the upper endplate suggesting subacute chronicity. Paraspinal and other soft tissues: Aortoiliac atherosclerotic vascular disease. Small to moderate type 1 hiatal hernia. Disc levels: No overt impingement observed. IMPRESSION: 1. Acute 35% superior endplate compression fracture at L2 with 3 mm of posterior bony retropulsion. This fracture was not present 2 days ago. 2. 30% superior endplate compression fracture at L4 has imaging characteristics of a subacute fracture, but was present on 08/06/2017. 3. Old compression fractures at T11 and T12. 4. These lumbar compression fractures indicate osteoporosis. 5.  Aortic Atherosclerosis (ICD10-I70.0). 6. Small to moderate type 1 hiatal hernia. Electronically Signed   By: Van Clines M.D.   On: 08/08/2017 09:16   Mr Lumbar Spine Wo Contrast  Result Date: 08/08/2017 CLINICAL DATA:  Low back  pain.  Lumbar spine fractures. EXAM: MRI LUMBAR SPINE WITHOUT CONTRAST TECHNIQUE: Multiplanar, multisequence MR imaging of the lumbar spine was performed. No intravenous contrast was administered. COMPARISON:  CT of the lumbar spine from the same day. CT abdomen and pelvis 08/06/2017. MRI of the lumbar spine 03/19/2016. FINDINGS: Segmentation: 5 non rib-bearing lumbar type vertebral bodies are present. Alignment:  AP alignment is anatomic. Vertebrae: Remote superior endplate fractures are present at T11 and T12. A superior endplate fracture of L2 is again noted with 35% superior compression. Superior endplate fracture at L4 measures approximately 30%. Signal characteristics suggests the L2 fracture is more acute. The L4 fracture is incompletely healed. Anterior inferior sclerotic changes are present at L3. Normal signal is present at L5. Vertebral body heights are preserved at L1, L3, and L5. Conus medullaris and cauda equina: Conus extends to the L1 level. Conus  and cauda equina appear normal. Paraspinal and other soft tissues: Limited imaging of the abdomen is unremarkable. Disc levels: T11-12: Minimal retropulsed bone is present without significant stenosis. T12-L1: Negative. L1-2: No significant retropulsed bone is present. There is no focal disc protrusion or stenosis. L2-3: No significant focal disc protrusion or stenosis is present. L3-4: A mild broad-based disc protrusion is present. Mild facet hypertrophy and ligamentum flavum thickening is noted. Mild subarticular and foraminal narrowing is present bilaterally, worse on the left. L4-5: A mild broad-based disc bulge is present. Mild facet hypertrophy is noted bilaterally. This results in mild subarticular and foraminal narrowing bilaterally, worse on the left. L5-S1: Negative. IMPRESSION: 1. Superior endplate fractures at L2 and L4 as previously described. The L2 fracture is more acute. Neither fracture is completely healed. 2. Marrow changes anteriorly and  inferiorly at L3 are likely reactive. 3. Remote superior endplate fractures at A56 and T12. 4. Mild subarticular and foraminal narrowing bilaterally at L3-4 greater than L4-5. Narrowing is worse on the left at both levels. Electronically Signed   By: San Morelle M.D.   On: 08/08/2017 12:15    ROS Low back pain. Remaining 10- point  ROS tested and were negative  Blood pressure (!) 158/73, pulse (!) 105, temperature 98.7 F (37.1 C), temperature source Oral, resp. rate 16, SpO2 95 %.   Physical Exam  TTP midline of lumbar spine. No step-off. No edema AROM limited by pain 5/5 PF/DF/EHL motor function bilaterally.  5/5 knee flexion/extension, hip flex/ext Sensation intact L4-S1 bilaterally No Clonus. No Babinski 2+ DTR equal and symmetric bilateral lower extremity  Assessment/Plan: 1. L2 Compression Fx-Acute 2. L4 Compression Fx-subacute 3. Remote T11-T12 compression Fx 4. Lumbar spondylosis, multilevel  -Recommend TLSO brace when OOB.  Log roll only for positioning until brace in place. Hold PT until brace is applied.  Final activity recommendations per Neurosurg or Ortho Spine -pain control -N-V checks. Patient has no history or physical exam findings of myelopathy/cord compression on exam. -Ortho spine and Neurosurgery eval pending for definitive treatment  Jaymes Graff 08/08/2017, 3:51 PM

## 2017-08-08 NOTE — ED Provider Notes (Signed)
Ct Lumbar Spine Wo Mack  Result Date: 08/08/2017 CLINICAL DATA:  Low back pain EXAM: CT LUMBAR SPINE WITHOUT Mack TECHNIQUE: Multidetector CT imaging of the lumbar spine was performed without intravenous Mack administration. Multiplanar CT image reconstructions were also generated. COMPARISON:  08/06/2017 and 03/19/2016 FINDINGS: Segmentation: The lowest lumbar type non-rib-bearing vertebra is labeled as L5. Alignment: No vertebral subluxation is observed. Vertebrae: Old 30% T11 and 40% T12 compression fractures are present. There is an acute 35% superior endplate compression fracture at L2 which was not present on 08/06/2017 (2 days ago), and this has 3 mm of associated posterior bony retropulsion. 30% superior endplate compression fracture at L4 is stable from 08/06/2017 but was not present on 03/19/2016, and has sclerosis along the upper endplate suggesting subacute chronicity. Paraspinal and other soft tissues: Aortoiliac atherosclerotic vascular disease. Small to moderate type 1 hiatal hernia. Disc levels: No overt impingement observed. IMPRESSION: 1. Acute 35% superior endplate compression fracture at L2 with 3 mm of posterior bony retropulsion. This fracture was not present 2 days ago. 2. 30% superior endplate compression fracture at L4 has imaging characteristics of a subacute fracture, but was present on 08/06/2017. 3. Old compression fractures at T11 and T12. 4. These lumbar compression fractures indicate osteoporosis. 5.  Aortic Atherosclerosis (ICD10-I70.0). 6. Small to moderate type 1 hiatal hernia. Electronically Signed   By: Van Clines M.D.   On: 08/08/2017 09:16   Ashley Mack  Result Date: 08/08/2017 CLINICAL DATA:  Low back pain.  Lumbar spine fractures. EXAM: MRI LUMBAR SPINE WITHOUT Mack TECHNIQUE: Multiplanar, multisequence Ashley imaging of the lumbar spine was performed. No intravenous Mack was administered. COMPARISON:  CT of the lumbar spine from  the same day. CT abdomen and pelvis 08/06/2017. MRI of the lumbar spine 03/19/2016. FINDINGS: Segmentation: 5 non rib-bearing lumbar type vertebral bodies are present. Alignment:  AP alignment is anatomic. Vertebrae: Remote superior endplate fractures are present at T11 and T12. A superior endplate fracture of L2 is again noted with 35% superior compression. Superior endplate fracture at L4 measures approximately 30%. Signal characteristics suggests the L2 fracture is more acute. The L4 fracture is incompletely healed. Anterior inferior sclerotic changes are present at L3. Normal signal is present at L5. Vertebral body heights are preserved at L1, L3, and L5. Conus medullaris and cauda equina: Conus extends to the L1 level. Conus and cauda equina appear normal. Paraspinal and other soft tissues: Limited imaging of the abdomen is unremarkable. Disc levels: T11-12: Minimal retropulsed bone is present without significant stenosis. T12-L1: Negative. L1-2: No significant retropulsed bone is present. There is no focal disc protrusion or stenosis. L2-3: No significant focal disc protrusion or stenosis is present. L3-4: A mild broad-based disc protrusion is present. Mild facet hypertrophy and ligamentum flavum thickening is noted. Mild subarticular and foraminal narrowing is present bilaterally, worse on the left. L4-5: A mild broad-based disc bulge is present. Mild facet hypertrophy is noted bilaterally. This results in mild subarticular and foraminal narrowing bilaterally, worse on the left. L5-S1: Negative. IMPRESSION: 1. Superior endplate fractures at L2 and L4 as previously described. The L2 fracture is more acute. Neither fracture is completely healed. 2. Marrow changes anteriorly and inferiorly at L3 are likely reactive. 3. Remote superior endplate fractures at O35 and T12. 4. Mild subarticular and foraminal narrowing bilaterally at L3-4 greater than L4-5. Narrowing is worse on the left at both levels. Electronically  Signed   By: San Morelle M.D.   On: 08/08/2017 12:15       -----------------------------------------  1:36 PM on 08/08/2017 -----------------------------------------  Pain control remains an issue.  Patient has had 8 of morphine now getting additional Dilaudid, any movement causes notable pain in her left lower back radiates to her left leg limiting use of the left leg.  She does appear to have intractable Pain doing acute new compression fracture.  Discussed the case and imaging with Dr. Lacinda Axon of neurosurgery, he advises LSO bracing.  Will admit patient to hospital, Dr. Lacinda Axon will be available to provide consultation from neurosurgery tomorrow as an inpatient.  MRI does not demonstrate any evidence of cauda equina. She is alert, oriented.  Reviewed results, plan for brace, and patient agreeable and requesting admission to the hospital due to pain control.    Medical screening examination/treatment/procedure(s) were conducted as a shared visit with non-physician practitioner(s) and myself.  I personally evaluated the patient during the encounter.     [name], MD 08/08/17 [phone]

## 2017-08-08 NOTE — H&P (Signed)
History and Physical    Ashley Mack ZHG:[id number] DOB: 1945-11-13 DOA: 08/08/2017  Referring physician: Dr. Jacqualine Code PCP: Kirk Ruths, MD  Specialists: none  Chief Complaint: back pain  HPI: Ashley Mack is a 72 y.o. female has a past medical history significant for COPD and anemia now with severe back pain not improved despite multiple doses of IV pain meds in ER. Cannot walk due to pain. MRI shows acute L2 compression fracture. She is now admitted for pain control.  Review of Systems: The patient denies anorexia, fever, weight loss,, vision loss, decreased hearing, hoarseness, chest pain, syncope, dyspnea on exertion, peripheral edema, balance deficits, hemoptysis, abdominal pain, melena, hematochezia, severe indigestion/heartburn, hematuria, incontinence, genital sores, muscle weakness, suspicious skin lesions, transient blindness, depression, unusual weight change, abnormal bleeding, enlarged lymph nodes, angioedema, and breast masses.   Past Medical History:  Diagnosis Date  . Anemia   . Arthritis    Rheumatoid  . [place] of gallbladder   . Centrilobular emphysema ([place])   . Dyspnea   . Fatty liver   . GERD (gastroesophageal reflux disease)   . History of fractured vertebra    sees chiropractor  . 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 Iron (Fe) deficiency anemia   . Migraine headache    in past  . Migraines   . Motion sickness   . 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 Osteoporosis   . Rheumatoid arthritis ([place])   . Vertigo    Past Surgical History:  Procedure Laterality Date  . CATARACT EXTRACTION W/PHACO Right 08/12/2016   Procedure: CATARACT EXTRACTION PHACO AND INTRAOCULAR LENS PLACEMENT ([place])  Right;  Surgeon: Leandrew Koyanagi, MD;  Location: [place];  Service: Ophthalmology;  Laterality: Right;  . CATARACT EXTRACTION W/PHACO Left 09/30/2016   Procedure: CATARACT EXTRACTION PHACO AND INTRAOCULAR LENS PLACEMENT ([place]) Left;  Surgeon:  Leandrew Koyanagi, MD;  Location: [place];  Service: Ophthalmology;  Laterality: Left;  . COLONOSCOPY    . COLONOSCOPY WITH PROPOFOL N/A 12/09/2016   Procedure: COLONOSCOPY WITH PROPOFOL;  Surgeon: Toledo, Benay Pike, MD;  Location: [facility];  Service: Endoscopy;  Laterality: N/A;  . ESOPHAGOGASTRODUODENOSCOPY    . ESOPHAGOGASTRODUODENOSCOPY (EGD) WITH PROPOFOL N/A 12/09/2016   Procedure: ESOPHAGOGASTRODUODENOSCOPY (EGD) WITH PROPOFOL;  Surgeon: Toledo, Benay Pike, MD;  Location: [facility];  Service: Endoscopy;  Laterality: N/A;  . OOPHORECTOMY Bilateral 2003   Social History:  reports that she quit smoking about 10 years ago. She has never used smokeless tobacco. She reports that she does not drink alcohol or use drugs.  Allergies  Allergen Reactions  . Hydrocodone-Acetaminophen Nausea And Vomiting    Dizziness  . Meloxicam Nausea And Vomiting  . Other Nausea Only    Arthritis medications   . Oxycodone     Dizzy, foggy feeling  . Promethazine Hcl Other (See Comments)  . Propoxyphene Nausea Only  . Ranitidine Hives  . Sulfasalazine Other (See Comments)    Stomach upset  . Topiramate Nausea Only    Family History  Problem Relation Age of Onset  . Kidney disease Mother   . Diabetes Mellitus II Brother     Prior to Admission medications   Medication Sig Start Date End Date Taking? Authorizing Provider  budesonide-formoterol (SYMBICORT) 80-4.5 MCG/ACT inhaler Inhale 2 puffs into the lungs every morning.     [provider]  Cholecalciferol (VITAMIN D3) 2000 UNITS capsule Take 1 capsule by mouth daily.    [provider]  DEXILANT 60 MG capsule Take 1 capsule by mouth daily. 08/04/14   [provider]  Fe Fum-FePoly-Vit C-Vit B3 (INTEGRA) 62.5-62.5-40-3 MG CAPS Take 1 capsule by mouth daily. 11/29/13   [provider]  fluticasone (FLONASE) 50 MCG/ACT nasal spray Place into both nostrils daily as needed for allergies or  rhinitis.    [provider]  naproxen sodium (ANAPROX) 220 MG tablet Take 220 mg by mouth as needed.    [provider]  ondansetron (ZOFRAN) 4 MG tablet Take 1 tablet (4 mg total) by mouth daily as needed for up to 20 doses (if needed for nausea with narcotic pain medication for back/flank pain). 08/07/17   Vickie Epley, MD  predniSONE (DELTASONE) 5 MG tablet Take 5 mg by mouth daily.     [provider]  traMADol-acetaminophen (ULTRACET) 37.5-325 MG tablet Take 1 tablet by mouth every 6 (six) hours as needed (for back/flank pain). 08/07/17   Vickie Epley, MD   Physical Exam: Vitals:   08/08/17 0308 08/08/17 0734 08/08/17 1351  BP: (!) 157/86  (!) 144/69  Pulse: 99  (!) 104  Resp: 20  18  Temp: 98.7 F (37.1 C)    TempSrc: Oral    SpO2: (!) 9% 98% 97%     General:  [place]/AT, WDWN, in moderate distress  Eyes: PERRL, EOMI, no scleral icterus, conjunctiva clear  ENT: moist oropharynx without exudate, TM's benign, dentition fair  Neck: supple, no lymphadenopathy. No bruits or thyromegaly  Cardiovascular: regular rate without MRG; 2+ peripheral pulses, no JVD, no peripheral edema  Respiratory: CTA biL, good air movement without wheezing, rhonchi or crackled. Respiratory effort normal  Abdomen: soft, non tender to palpation, positive bowel sounds, no guarding, no rebound  Skin: no rashes or lesions  Musculoskeletal: normal bulk and tone, no joint swelling  Psychiatric: normal mood and affect, A&OX3  Neurologic: CN 2-12 grossly intact, Motor strength 5/5 in all 4 groups with symmetric DTR's and non-focal sensory exam  Labs on Admission:  Basic Metabolic Panel: Recent Labs  Lab 08/06/17 1121 08/07/17 0458 08/08/17 0746  NA 135 136 132*  K 3.4* 3.5 3.8  CL 98* 102 97*  CO2 26 28 26   GLUCOSE 127* 111* 96  BUN 11 12 19   CREATININE 0.68 0.78 0.81  CALCIUM 9.0 8.4* 8.7*   Liver Function Tests: Recent Labs  Lab 08/06/17 1121  08/07/17 0458  AST 22 16  ALT 17 13*  ALKPHOS 82 69  BILITOT 0.7 0.6  PROT 7.3 6.4*  ALBUMIN 4.3 3.5   Recent Labs  Lab 08/06/17 1121  LIPASE 34   No results for input(s): AMMONIA in the last 168 hours. CBC: Recent Labs  Lab 08/06/17 1121 08/07/17 0458 08/08/17 0746  WBC 12.8* 8.7 14.5*  NEUTROABS 11.9*  --   --   HGB 12.8 11.9* 12.5  HCT 38.6 34.5* 36.0  MCV 87.5 88.2 87.1  PLT 388 350 337   Cardiac Enzymes: No results for input(s): CKTOTAL, CKMB, CKMBINDEX, TROPONINI in the last 168 hours.  BNP (last 3 results) No results for input(s): BNP in the last 8760 hours.  ProBNP (last 3 results) No results for input(s): PROBNP in the last 8760 hours.  CBG: No results for input(s): GLUCAP in the last 168 hours.  Radiological Exams on Admission: Ct Lumbar Spine Wo Contrast  Result Date: 08/08/2017 CLINICAL DATA:  Low back pain EXAM: CT LUMBAR SPINE WITHOUT CONTRAST TECHNIQUE: Multidetector CT imaging of the lumbar  spine was performed without intravenous contrast administration. Multiplanar CT image reconstructions were also generated. COMPARISON:  08/06/2017 and 03/19/2016 FINDINGS: Segmentation: The lowest lumbar type non-rib-bearing vertebra is labeled as L5. Alignment: No vertebral subluxation is observed. Vertebrae: Old 30% T11 and 40% T12 compression fractures are present. There is an acute 35% superior endplate compression fracture at L2 which was not present on 08/06/2017 (2 days ago), and this has 3 mm of associated posterior bony retropulsion. 30% superior endplate compression fracture at L4 is stable from 08/06/2017 but was not present on 03/19/2016, and has sclerosis along the upper endplate suggesting subacute chronicity. Paraspinal and other soft tissues: Aortoiliac atherosclerotic vascular disease. Small to moderate type 1 hiatal hernia. Disc levels: No overt impingement observed. IMPRESSION: 1. Acute 35% superior endplate compression fracture at L2 with 3 mm of  posterior bony retropulsion. This fracture was not present 2 days ago. 2. 30% superior endplate compression fracture at L4 has imaging characteristics of a subacute fracture, but was present on 08/06/2017. 3. Old compression fractures at T11 and T12. 4. These lumbar compression fractures indicate osteoporosis. 5.  Aortic Atherosclerosis (ICD10-I70.0). 6. Small to moderate type 1 hiatal hernia. Electronically Signed   By: Van Clines M.D.   On: 08/08/2017 09:16   Mr Lumbar Spine Wo Contrast  Result Date: 08/08/2017 CLINICAL DATA:  Low back pain.  Lumbar spine fractures. EXAM: MRI LUMBAR SPINE WITHOUT CONTRAST TECHNIQUE: Multiplanar, multisequence MR imaging of the lumbar spine was performed. No intravenous contrast was administered. COMPARISON:  CT of the lumbar spine from the same day. CT abdomen and pelvis 08/06/2017. MRI of the lumbar spine 03/19/2016. FINDINGS: Segmentation: 5 non rib-bearing lumbar type vertebral bodies are present. Alignment:  AP alignment is anatomic. Vertebrae: Remote superior endplate fractures are present at T11 and T12. A superior endplate fracture of L2 is again noted with 35% superior compression. Superior endplate fracture at L4 measures approximately 30%. Signal characteristics suggests the L2 fracture is more acute. The L4 fracture is incompletely healed. Anterior inferior sclerotic changes are present at L3. Normal signal is present at L5. Vertebral body heights are preserved at L1, L3, and L5. Conus medullaris and cauda equina: Conus extends to the L1 level. Conus and cauda equina appear normal. Paraspinal and other soft tissues: Limited imaging of the abdomen is unremarkable. Disc levels: T11-12: Minimal retropulsed bone is present without significant stenosis. T12-L1: Negative. L1-2: No significant retropulsed bone is present. There is no focal disc protrusion or stenosis. L2-3: No significant focal disc protrusion or stenosis is present. L3-4: A mild broad-based disc  protrusion is present. Mild facet hypertrophy and ligamentum flavum thickening is noted. Mild subarticular and foraminal narrowing is present bilaterally, worse on the left. L4-5: A mild broad-based disc bulge is present. Mild facet hypertrophy is noted bilaterally. This results in mild subarticular and foraminal narrowing bilaterally, worse on the left. L5-S1: Negative. IMPRESSION: 1. Superior endplate fractures at L2 and L4 as previously described. The L2 fracture is more acute. Neither fracture is completely healed. 2. Marrow changes anteriorly and inferiorly at L3 are likely reactive. 3. Remote superior endplate fractures at C58 and T12. 4. Mild subarticular and foraminal narrowing bilaterally at L3-4 greater than L4-5. Narrowing is worse on the left at both levels. Electronically Signed   By: San Morelle M.D.   On: 08/08/2017 12:15    EKG: Independently reviewed.  Assessment/Plan Principal Problem:   Compression fracture of lumbar vertebra (HCC) Active Problems:   B12 deficiency   Iron deficiency  anemia   Intractable back pain   Will admit to floor with IV Dilaudid and po Flexeril and heat. Consult Ortho, Neurosurgery and PT. Repeat labs in AM  Diet: regular Fluids: NS@75  DVT Prophylaxis: SQ Heparin  Code Status: FULL  Family Communication: yes  Disposition Plan: home  Time spent: 50 min

## 2017-08-08 NOTE — ED Provider Notes (Signed)
[facility] Emergency Department Provider Note  ____________________________________________  Time seen: Approximately 7:45 AM  I have reviewed the triage vital signs and the nursing notes.   HISTORY  Chief Complaint Back Pain    HPI Ashley Mack is a 72 y.o. female presents emergency department for evaluation of low back pain worsening this morning.  She was discharged from the hospital yesterday morning for observation for possible appendicitis.  When she was being discharged, she bent over  to pick up a shirt and felt a sharp stabbing pain in her back.  Pain worsened last night.  Pain radiates around her left side.  It is painful with walking.No fever, chills, bowel or bladder dysfunction, saddle paresthesias, weakness, numbness, tingling.   Past Medical History:  Diagnosis Date  . Anemia   . Arthritis    Rheumatoid  . [place] of gallbladder   . Centrilobular emphysema ([place])   . Dyspnea   . Fatty liver   . GERD (gastroesophageal reflux disease)   . History of fractured vertebra    sees chiropractor  . 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 Iron (Fe) deficiency anemia   . Migraine headache    in past  . Migraines   . Motion sickness   . 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 Osteoporosis   . Rheumatoid arthritis ([place])   . Vertigo     Patient Active Problem List   Diagnosis Date Noted  . Intractable back pain 08/08/2017  . Compression fracture of lumbar vertebra ([place]) 08/08/2017  . Abdominal pain, RUQ 08/06/2017  . Fatigue 05/29/2017  . Exertional dyspnea 05/29/2017  . Guaiac positive stools 11/26/2016  . Iron deficiency anemia 09/01/2016  . B12 deficiency 08/31/2016  . Normocytic anemia 08/30/2016    Past Surgical History:  Procedure Laterality Date  . CATARACT EXTRACTION W/PHACO Right 08/12/2016   Procedure: CATARACT EXTRACTION PHACO AND INTRAOCULAR LENS PLACEMENT ([place])  Right;  Surgeon: Leandrew Koyanagi, MD;  Location: [place];  Service: Ophthalmology;  Laterality: Right;  . CATARACT EXTRACTION W/PHACO Left 09/30/2016   Procedure: CATARACT EXTRACTION PHACO AND INTRAOCULAR LENS PLACEMENT ([place]) Left;  Surgeon: Leandrew Koyanagi, MD;  Location: [place];  Service: Ophthalmology;  Laterality: Left;  . COLONOSCOPY    . COLONOSCOPY WITH PROPOFOL N/A 12/09/2016   Procedure: COLONOSCOPY WITH PROPOFOL;  Surgeon: Tol[place], Benay Pike, MD;  Location: [facility];  Service: Endoscopy;  Laterality: N/A;  . ESOPHAGOGASTRODUODENOSCOPY    . ESOPHAGOGASTRODUODENOSCOPY (EGD) WITH PROPOFOL N/A 12/09/2016   Procedure: ESOPHAGOGASTRODUODENOSCOPY (EGD) WITH PROPOFOL;  Surgeon: Toledo, Benay Pike, MD;  Location: [facility];  Service: Endoscopy;  Laterality: N/A;  . OOPHORECTOMY Bilateral 2003    Prior to Admission medications   Medication Sig Start Date End Date Taking? Authorizing Provider  budesonide-formoterol (SYMBICORT) 80-4.5 MCG/ACT inhaler Inhale 2 puffs into the lungs every morning.    Yes [provider]  Cholecalciferol (VITAMIN D3) 2000 UNITS capsule Take 1 capsule by mouth daily.   Yes [provider]  cyclobenzaprine (FLEXERIL) 5 MG tablet Take 5 mg by mouth 3 (three) times daily as needed. 08/06/17  Yes [provider]  DEXILANT 60 MG capsule Take 1 capsule by mouth daily. 08/04/14  Yes [provider]  Fe Fum-FePoly-Vit C-Vit B3 (INTEGRA) 62.5-62.5-40-3 MG CAPS Take 1 capsule by mouth daily. 11/29/13  Yes [provider]  fluticasone (FLONASE) 50 MCG/ACT nasal spray Place into both nostrils daily as needed for allergies or rhinitis.  Yes [provider]  naproxen sodium (ANAPROX) 220 MG tablet Take 220 mg by mouth as needed.   Yes [provider]  ondansetron (ZOFRAN) 4 MG tablet Take 1 tablet (4 mg total) by mouth daily as needed for up to 20 doses (if needed for nausea with narcotic pain medication for back/flank pain). 08/07/17  Yes  Vickie Epley, MD  predniSONE (DELTASONE) 5 MG tablet Take 5 mg by mouth daily.    Yes [provider]  traMADol-acetaminophen (ULTRACET) 37.5-325 MG tablet Take 1 tablet by mouth every 6 (six) hours as needed (for back/flank pain). 08/07/17  Yes Vickie Epley, MD    Allergies Hydrocodone-acetaminophen; Meloxicam; Other; Oxycodone; Promethazine hcl; Propoxyphene; Ranitidine; Sulfasalazine; and Topiramate  Family History  Problem Relation Age of Onset  . Kidney disease Mother   . Diabetes Mellitus II Brother     Social History Social History   Tobacco Use  . Smoking status: Former Smoker    Last attempt to quit: 2009    Years since quitting: 10.4  . Smokeless tobacco: Never Used  Substance Use Topics  . Alcohol use: No  . Drug use: No     Review of Systems  Cardiovascular: No chest pain. Respiratory: No SOB. Gastrointestinal: No abdominal pain.  No nausea, no vomiting.  Genitourinary: Negative for dysuria. Musculoskeletal: Positive for back pain.  Skin: Negative for rash, abrasions, lacerations, ecchymosis. Neurological: Negative for headaches, numbness or tingling   ____________________________________________   PHYSICAL EXAM:  VITAL SIGNS: ED Triage Vitals [08/08/17 0308]  Enc Vitals Group     BP (!) 157/86     Pulse Rate 99     Resp 20     Temp 98.7 F (37.1 C)     Temp Source Oral     SpO2 (!) 9 %     Weight      Height      Head Circumference      Peak Flow      Pain Score      Pain Loc      Pain Edu?      Excl. in [place]?      Constitutional: Alert and oriented. Well appearing and in no acute distress. Eyes: Conjunctivae are normal. PERRL. EOMI. Head: Atraumatic. ENT:      Ears:      Nose: No congestion/rhinnorhea.      Mouth/Throat: Mucous membranes are moist.  Neck: No stridor.   Cardiovascular: Normal rate, regular rhythm.  Good peripheral circulation. Respiratory: Normal respiratory effort without tachypnea or retractions.  Lungs CTAB. Good air entry to the bases with no decreased or absent breath sounds. Gastrointestinal: Bowel sounds 4 quadrants. Soft and nontender to palpation. No guarding or rigidity. No palpable masses. No distention.  Musculoskeletal: Full range of motion to all extremities. No gross deformities appreciated.  Extreme tenderness to palpation over superior lumbar spine.  Strength and sensation equal in lower extremity's bilaterally.  Neurologic:  Normal speech and language. No gross focal neurologic deficits are appreciated.  Skin:  Skin is warm, dry and intact. No rash noted. Psychiatric: Mood and affect are normal. Speech and behavior are normal. Patient exhibits appropriate insight and judgement.   ____________________________________________   LABS (all labs ordered are listed, but only abnormal results are displayed)  Labs Reviewed  CBC - Abnormal; Notable for the following components:      Result Value   WBC 14.5 (*)    RDW 16.1 (*)    All other components  within normal limits  BASIC METABOLIC PANEL - Abnormal; Notable for the following components:   Sodium 132 (*)    Chloride 97 (*)    Calcium 8.7 (*)    All other components within normal limits   ____________________________________________  EKG   ____________________________________________  RADIOLOGY  Ct Lumbar Spine Wo Contrast  Result Date: 08/08/2017 CLINICAL DATA:  Low back pain EXAM: CT LUMBAR SPINE WITHOUT CONTRAST TECHNIQUE: Multidetector CT imaging of the lumbar spine was performed without intravenous contrast administration. Multiplanar CT image reconstructions were also generated. COMPARISON:  08/06/2017 and 03/19/2016 FINDINGS: Segmentation: The lowest lumbar type non-rib-bearing vertebra is labeled as L5. Alignment: No vertebral subluxation is observed. Vertebrae: Old 30% T11 and 40% T12 compression fractures are present. There is an acute 35% superior endplate compression fracture at L2 which was not  present on 08/06/2017 (2 days ago), and this has 3 mm of associated posterior bony retropulsion. 30% superior endplate compression fracture at L4 is stable from 08/06/2017 but was not present on 03/19/2016, and has sclerosis along the upper endplate suggesting subacute chronicity. Paraspinal and other soft tissues: Aortoiliac atherosclerotic vascular disease. Small to moderate type 1 hiatal hernia. Disc levels: No overt impingement observed. IMPRESSION: 1. Acute 35% superior endplate compression fracture at L2 with 3 mm of posterior bony retropulsion. This fracture was not present 2 days ago. 2. 30% superior endplate compression fracture at L4 has imaging characteristics of a subacute fracture, but was present on 08/06/2017. 3. Old compression fractures at T11 and T12. 4. These lumbar compression fractures indicate osteoporosis. 5.  Aortic Atherosclerosis (ICD10-I70.0). 6. Small to moderate type 1 hiatal hernia. Electronically Signed   By: Van Clines M.D.   On: 08/08/2017 09:16   Mr Lumbar Spine Wo Contrast  Result Date: 08/08/2017 CLINICAL DATA:  Low back pain.  Lumbar spine fractures. EXAM: MRI LUMBAR SPINE WITHOUT CONTRAST TECHNIQUE: Multiplanar, multisequence MR imaging of the lumbar spine was performed. No intravenous contrast was administered. COMPARISON:  CT of the lumbar spine from the same day. CT abdomen and pelvis 08/06/2017. MRI of the lumbar spine 03/19/2016. FINDINGS: Segmentation: 5 non rib-bearing lumbar type vertebral bodies are present. Alignment:  AP alignment is anatomic. Vertebrae: Remote superior endplate fractures are present at T11 and T12. A superior endplate fracture of L2 is again noted with 35% superior compression. Superior endplate fracture at L4 measures approximately 30%. Signal characteristics suggests the L2 fracture is more acute. The L4 fracture is incompletely healed. Anterior inferior sclerotic changes are present at L3. Normal signal is present at L5. Vertebral body  heights are preserved at L1, L3, and L5. Conus medullaris and cauda equina: Conus extends to the L1 level. Conus and cauda equina appear normal. Paraspinal and other soft tissues: Limited imaging of the abdomen is unremarkable. Disc levels: T11-12: Minimal retropulsed bone is present without significant stenosis. T12-L1: Negative. L1-2: No significant retropulsed bone is present. There is no focal disc protrusion or stenosis. L2-3: No significant focal disc protrusion or stenosis is present. L3-4: A mild broad-based disc protrusion is present. Mild facet hypertrophy and ligamentum flavum thickening is noted. Mild subarticular and foraminal narrowing is present bilaterally, worse on the left. L4-5: A mild broad-based disc bulge is present. Mild facet hypertrophy is noted bilaterally. This results in mild subarticular and foraminal narrowing bilaterally, worse on the left. L5-S1: Negative. IMPRESSION: 1. Superior endplate fractures at L2 and L4 as previously described. The L2 fracture is more acute. Neither fracture is completely healed. 2. Marrow changes anteriorly  and inferiorly at L3 are likely reactive. 3. Remote superior endplate fractures at X32 and T12. 4. Mild subarticular and foraminal narrowing bilaterally at L3-4 greater than L4-5. Narrowing is worse on the left at both levels. Electronically Signed   By: San Morelle M.D.   On: 08/08/2017 12:15    ____________________________________________    PROCEDURES  Procedure(s) performed:    Procedures    Medications  0.9 %  sodium chloride infusion ( Intravenous New Bag/Given 08/08/17 1559)  HYDROmorphone (DILAUDID) injection 1 mg (has no administration in time range)  traMADol (ULTRAM) tablet 50 mg (has no administration in time range)  cyclobenzaprine (FLEXERIL) tablet 10 mg (has no administration in time range)  pantoprazole (PROTONIX) EC tablet 40 mg (has no administration in time range)  mometasone-formoterol (DULERA) 100-5  MCG/ACT inhaler 2 puff (has no administration in time range)  INTEGRA 62.5-62.5-40-3 MG CAPS 1 capsule (1 capsule Oral Not Given 08/08/17 1559)  fluticasone (FLONASE) 50 MCG/ACT nasal spray 2 spray (has no administration in time range)  predniSONE (DELTASONE) tablet 5 mg (has no administration in time range)  heparin injection 5,000 Units (has no administration in time range)  acetaminophen (TYLENOL) tablet 650 mg (has no administration in time range)    Or  acetaminophen (TYLENOL) suppository 650 mg (has no administration in time range)  docusate sodium (COLACE) capsule 100 mg (has no administration in time range)  bisacodyl (DULCOLAX) suppository 10 mg (has no administration in time range)  ondansetron (ZOFRAN) tablet 4 mg (has no administration in time range)    Or  ondansetron (ZOFRAN) injection 4 mg (has no administration in time range)  morphine 4 MG/ML injection 4 mg (4 mg Intravenous Given 08/08/17 0747)  ondansetron (ZOFRAN) injection 4 mg (4 mg Intravenous Given 08/08/17 0746)  morphine 4 MG/ML injection 4 mg (4 mg Intravenous Given 08/08/17 1107)  cyclobenzaprine (FLEXERIL) tablet 5 mg (5 mg Oral Given 08/08/17 1253)  HYDROmorphone (DILAUDID) injection 0.5 mg (0.5 mg Intravenous Given 08/08/17 1351)     ____________________________________________   INITIAL IMPRESSION / ASSESSMENT AND PLAN / ED COURSE  Pertinent labs & imaging results that were available during my care of the patient were reviewed by me and considered in my medical decision making (see chart for details).  Review of the [place] CSRS was performed in accordance of the [place] prior to dispensing any controlled drugs.  Patient presented to the emergency department for evaluation of worsening back pain.  Vital signs and exam are reassuring.  CT consistent with 35%  acute compression fracture of L2 with retropulsion and subacute 30% compression fracture of L4.  No neurological symptoms.  Patient continues to have pain with 8  milligrams morphine and is starting Dilaudid.  Dr. Rudene Christians, Dr. Adin Hector, Dr. Lacinda Axon and Dr. Jacqualine Code were consulted.  Patient will be admitted for pain control.  Report was given to Dr. Doy Hutching.       ____________________________________________  FINAL CLINICAL IMPRESSION(S) / ED DIAGNOSES  Final diagnoses:  Lumbar compression fracture, closed, initial encounter ([place])      NEW MEDICATIONS STARTED DURING THIS VISIT:  ED Discharge Orders    None          This chart was dictated using voice recognition software/Dragon. Despite best efforts to proofread, errors can occur which can change the meaning. Any change was purely unintentional.    Laban Emperor, PA-C 08/08/17 1624    [name], MD 08/09/17 [phone]

## 2017-08-08 NOTE — ED Notes (Signed)
Patient transported to MRI 

## 2017-08-08 NOTE — Progress Notes (Signed)
PT Cancellation Note  Patient Details Name: Ashley Mack MRN: [id number] DOB: 11/16/1945   Cancelled Treatment:    Reason Eval/Treat Not Completed: Other (comment).  PT consult received.  Chart reviewed.  Pt currently in ED but per chart being admitted for pain control (pt with acute L2 compression fx).  Pt currently pending orthopedic surgery and neurosurgery consults; pt also with noted LSO brace order (outside vendor).  Will re-attempt PT evaluation tomorrow as appropriate once plan of care is known and pt has brace.  Leitha Bleak, PT 08/08/17, 3:11 PM [phone]

## 2017-08-09 LAB — CBC
HCT: 32.3 % — ABNORMAL LOW (ref 35.0–47.0)
Hemoglobin: 10.9 g/dL — ABNORMAL LOW (ref 12.0–16.0)
MCH: 30.2 pg (ref 26.0–34.0)
MCHC: 33.8 g/dL (ref 32.0–36.0)
MCV: 89.4 fL (ref 80.0–100.0)
PLATELETS: 301 10*3/uL (ref 150–440)
RBC: 3.61 MIL/uL — AB (ref 3.80–5.20)
RDW: 16.2 % — AB (ref 11.5–14.5)
WBC: 11.1 10*3/uL — AB (ref 3.6–11.0)

## 2017-08-09 LAB — COMPREHENSIVE METABOLIC PANEL
ALK PHOS: 60 U/L (ref 38–126)
ALT: 13 U/L — AB (ref 14–54)
AST: 15 U/L (ref 15–41)
Albumin: 3.1 g/dL — ABNORMAL LOW (ref 3.5–5.0)
Anion gap: 8 (ref 5–15)
BILIRUBIN TOTAL: 0.9 mg/dL (ref 0.3–1.2)
BUN: 20 mg/dL (ref 6–20)
CO2: 26 mmol/L (ref 22–32)
CREATININE: 0.85 mg/dL (ref 0.44–1.00)
Calcium: 8.1 mg/dL — ABNORMAL LOW (ref 8.9–10.3)
Chloride: 99 mmol/L — ABNORMAL LOW (ref 101–111)
GFR calc non Af Amer: >60 mL/min (ref >60)
Glucose, Bld: 74 mg/dL (ref 65–99)
Potassium: 3.5 mmol/L (ref 3.5–5.1)
SODIUM: 133 mmol/L — AB (ref 135–145)
TOTAL PROTEIN: 5.9 g/dL — AB (ref 6.5–8.1)

## 2017-08-09 MED ORDER — CEFAZOLIN (ANCEF) 1 G IV SOLR
1.0000 g | INTRAVENOUS | Status: AC
  Administered 2017-08-10: 1 g
  Filled 2017-08-09: qty 1

## 2017-08-09 MED ORDER — HYDROCODONE-ACETAMINOPHEN 5-325 MG PO TABS
1.0000 | ORAL_TABLET | Freq: Four times a day (QID) | ORAL | Status: DC | PRN
  Administered 2017-08-09: 1 via ORAL
  Administered 2017-08-09: 2 via ORAL
  Administered 2017-08-10 – 2017-08-11 (×2): 1 via ORAL
  Filled 2017-08-09 (×3): qty 1
  Filled 2017-08-09: qty 2

## 2017-08-09 MED ORDER — TRAMADOL HCL 50 MG PO TABS
100.0000 mg | ORAL_TABLET | Freq: Four times a day (QID) | ORAL | Status: DC
  Administered 2017-08-09 – 2017-08-11 (×6): 100 mg via ORAL
  Filled 2017-08-09 (×6): qty 2

## 2017-08-09 NOTE — Progress Notes (Signed)
Patient is seen for continued pain discomfort from L2 compression fracture.  She reports a fall at the end of March that apparently resulted in the L4 fracture that signal still visible but there is minimal with reported very little pain prior to this more recent injury.  With her recent injury she suffered acute L2 fracture as it was not present on prior renal CT scan.  She is having severe pain and is writhing around in the bed and tense pain with any after any movement.  She have a history of spinal stenosis as well visualized on the MRI.  On examination she has acute point tenderness at L2 minimal tenderness at L4.  With clonus testing she is negative for clonus but for some movement of the leg causes severe back pain.  Is able to flex extend the toes.  Radiographic review shows acute L2 compression fracture with subacute L4 with minimal signal change and old T12 with no signal change in all  Impression is acute L2, subacute L4 fractures with L4 being asymptomatic or minimally symptomatic at present.  Recommendation is for L2 kyphoplasty.  Reviewed the procedure with her today and will plan on that tomorrow.

## 2017-08-09 NOTE — NC FL2 (Signed)
[place] LEVEL OF CARE SCREENING TOOL     IDENTIFICATION  Patient Name: Ashley Mack Birthdate: 28-Jun-1945 Sex: female Admission Date (Current Location): 08/08/2017  [place] and [place] Number:  Engineering geologist and Address:  [facility], [address] [place], [place] [address]      Provider Number: [id number]  Attending Physician Name and Address:  Loletha Grayer, MD  Relative Name and Phone Number:       Current Level of Care: Hospital Recommended Level of Care: [place] Prior Approval Number:    Date Approved/Denied:   PASRR Number: [id number])  Discharge Plan: SNF    Current Diagnoses: Patient Active Problem List   Diagnosis Date Noted  . Intractable back pain 08/08/2017  . Compression fracture of lumbar vertebra ([place]) 08/08/2017  . Abdominal pain, RUQ 08/06/2017  . Fatigue 05/29/2017  . Exertional dyspnea 05/29/2017  . Guaiac positive stools 11/26/2016  . Iron deficiency anemia 09/01/2016  . B12 deficiency 08/31/2016  . Normocytic anemia 08/30/2016    Orientation RESPIRATION BLADDER Height & Weight     Self, Time, Situation, Place  Normal Continent Weight: 155 lb (70.3 kg) Height:  5\' 2"  (157.5 cm)  BEHAVIORAL SYMPTOMS/MOOD NEUROLOGICAL BOWEL NUTRITION STATUS      Continent Diet(Diet: NPO for surgery. )  AMBULATORY STATUS COMMUNICATION OF NEEDS Skin   Extensive Assist Verbally Surgical wounds                       Personal Care Assistance Level of Assistance  Bathing, Feeding, Dressing Bathing Assistance: Limited assistance Feeding assistance: Independent Dressing Assistance: Limited assistance     Functional Limitations Info  Sight, Hearing, Speech Sight Info: Impaired Hearing Info: Adequate Speech Info: Adequate    SPECIAL CARE FACTORS FREQUENCY  PT (By licensed PT), OT (By licensed OT)     PT Frequency: (5) OT Frequency: (5)            Contractures       Additional Factors Info  Code Status, Allergies Code Status Info: (Full Code. ) Allergies Info: (Hydrocodone-acetaminophen, Meloxicam, Other, Oxycodone, Promethazine Hcl, Propoxyphene, Ranitidine, Sulfasalazine, Topiramate)           Current Medications (08/09/2017):  This is the current hospital active medication list Current Facility-Administered Medications  Medication Dose Route Frequency Provider Last Rate Last Dose  . 0.9 %  sodium chloride infusion   Intravenous Continuous Idelle Crouch, MD 75 mL/hr at 08/09/17 0653 75 mL/hr at 08/09/17 0653  . acetaminophen (TYLENOL) tablet 650 mg  650 mg Oral Q6H PRN Idelle Crouch, MD       Or  . acetaminophen (TYLENOL) suppository 650 mg  650 mg Rectal Q6H PRN Idelle Crouch, MD      . bisacodyl (DULCOLAX) suppository 10 mg  10 mg Rectal Daily PRN Idelle Crouch, MD      . Derrill Memo ON 08/10/2017] ceFAZolin (ANCEF) powder 1 g  1 g Other To OR Hessie Knows, MD      . cyclobenzaprine (FLEXERIL) tablet 10 mg  10 mg Oral Q8H Idelle Crouch, MD   10 mg at 08/09/17 0559  . docusate sodium (COLACE) capsule 100 mg  100 mg Oral BID Idelle Crouch, MD   100 mg at 08/09/17 0846  . fluticasone (FLONASE) 50 MCG/ACT nasal spray 2 spray  2 spray Each Nare Daily Idelle Crouch, MD      . heparin injection 5,000 Units  5,000 Units Subcutaneous Q8H Idelle Crouch, MD      . HYDROcodone-acetaminophen (NORCO/VICODIN) 5-325 MG per tablet 1-2 tablet  1-2 tablet Oral Q6H PRN Hessie Knows, MD   1 tablet at 08/09/17 1254  . HYDROmorphone (DILAUDID) injection 1 mg  1 mg Intravenous Q4H PRN Idelle Crouch, MD   1 mg at 08/09/17 0839  . INTEGRA 62.5-62.5-40-3 MG CAPS 1 capsule  1 capsule Oral Daily Idelle Crouch, MD      . mometasone-formoterol [facility]) 100-5 MCG/ACT inhaler 2 puff  2 puff Inhalation BID Idelle Crouch, MD   2 puff at 08/09/17 [phone]  . ondansetron (ZOFRAN) tablet 4 mg  4 mg Oral Q6H PRN Idelle Crouch, MD       Or  .  ondansetron (ZOFRAN) injection 4 mg  4 mg Intravenous Q6H PRN Idelle Crouch, MD      . pantoprazole (PROTONIX) EC tablet 40 mg  40 mg Oral Daily Idelle Crouch, MD   40 mg at 08/09/17 0840  . predniSONE (DELTASONE) tablet 5 mg  5 mg Oral Daily Idelle Crouch, MD   5 mg at 08/09/17 0845  . traMADol (ULTRAM) tablet 100 mg  100 mg Oral Q6H Hessie Knows, MD         Discharge Medications: Please see discharge summary for a list of discharge medications.  Relevant Imaging Results:  Relevant Lab Results:   Additional Information (SSN: [id number])  Barlow Harrison, Veronia Beets, LCSW

## 2017-08-09 NOTE — Progress Notes (Signed)
PT Cancellation Note  Patient Details Name: Ashley Mack MRN: [id number] DOB: December 12, 1945   Cancelled Treatment:    Reason Eval/Treat Not Completed: Pain limiting ability to participate.  PT consult received.  Chart reviewed.  Nurse reporting pt with a lot of pain with any movement.  Discussed pt with MD Rudene Christians who recommended holding PT today d/t pt's pain and plan for L2 kyphoplasty tomorrow.  Per discussion with MD Rudene Christians, will re-attempt PT evaluation after L2 kyphoplasty (as medically appropriate).  Leitha Bleak, PT 08/09/17, 1:05 PM [phone]

## 2017-08-09 NOTE — Consult Note (Signed)
Neurosurgery-New Consultation Evaluation 08/09/2017 [name] [id number]  Identifying Statement: Ashley Mack is a 72 y.o. female from [place] [id number] with back pain  Physician Requesting Consultation: Loletha Grayer, MD  History of Present Illness: Ashley Mack is here with acute on chronic back pain. She notes that she has had back pain in the past but did have a recent fall resulting in multiple injuries but also worsened back pain. She denies any obvious numbness or weakness but does feel limited by the pain. She was unable to get her pain under control at home and presented to the ED yesterday. There imaging was obtained showing an acute lumbar compression fracture and known chronic fractures.   Past Medical History:  Past Medical History:  Diagnosis Date  . Anemia   . Arthritis    Rheumatoid  . [place]y of gallbladder   . Centrilobular emphysema ([place])   . Dyspnea   . Fatty liver   . GERD (gastroesophageal reflux disease)   . History of fractured vertebra    sees chiropractor  . 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 Iron (Fe) deficiency anemia   . Migraine headache    in past  . Migraines   . Motion sickness   . 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 Osteoporosis   . Rheumatoid arthritis ([place])   . Vertigo     Social History: Social History   Socioeconomic History  . Marital status: Widowed    Spouse name: Not on file  . Number of children: Not on file  . Years of education: Not on file  . Highest education level: Not on file  Occupational History  . Not on file  Social Needs  . Financial resource strain: Not on file  . Food insecurity:    Worry: Not on file    Inability: Not on file  . Transportation needs:    Medical: Not on file    Non-medical: Not on file  Tobacco Use  . Smoking status: Former Smoker    Last attempt to quit: 2009    Years since quitting: 10.4  . Smokeless tobacco: Never Used  Substance and Sexual Activity  . Alcohol use: No   . Drug use: No  . Sexual activity: Not on file  Lifestyle  . Physical activity:    Days per week: Not on file    Minutes per session: Not on file  . Stress: Not on file  Relationships  . Social connections:    Talks on phone: Not on file    Gets together: Not on file    Attends religious service: Not on file    Active member of club or organization: Not on file    Attends meetings of clubs or organizations: Not on file    Relationship status: Not on file  . Intimate partner violence:    Fear of current or ex partner: Not on file    Emotionally abused: Not on file    Physically abused: Not on file    Forced sexual activity: Not on file  Other Topics Concern  . Not on file  Social History Narrative  . Not on file     Family History: Family History  Problem Relation Age of Onset  . Kidney disease Mother   . Diabetes Mellitus II Brother     Review of Systems:  Review of Systems - General ROS: Negative Psychological ROS: Negative Ophthalmic ROS: Negative ENT ROS: Negative Hematological and Lymphatic  ROS: Negative  Endocrine ROS: Negative Respiratory ROS: Negative Cardiovascular ROS: Negative Gastrointestinal ROS: Negative Genito-Urinary ROS: Negative Musculoskeletal ROS: Positive for back pain Neurological ROS: Negative for numbness or weakness Dermatological ROS: Negative  Physical Exam: BP (!) 118/57   Pulse (!) 101   Temp 99.3 F (37.4 C) (Oral)   Resp 16   Ht 5\' 2"  (1.575 m)   Wt 70.3 kg (155 lb)   SpO2 93%   BMI 28.35 kg/m  Body mass index is 28.35 kg/m. Body surface area is 1.75 meters squared. General appearance: Alert, cooperative, in no acute distress Head: Normocephalic, atraumatic Eyes: Normal, EOM intact Oropharynx: Moist without lesions Ext: No edema in LE bilaterally, warm extremities  Neurologic exam:  Mental status: alertness: alert,  affect: normal Speech: fluent and clear Motor:strength symmetric 5/5 in bilateral hip flexion, knee  flexion and extension, dorsi/plantar flexion. There is pain elicited in the back with testing. Sensory: intact to light touch in bilateral lower extremities Gait: not tested given pain  Imaging: MRI Lumbar Spine:1. Superior endplate fractures at L2 and L4 as previously described. The L2 fracture is more acute. Neither fracture is completely healed.2. Marrow changes anteriorly and inferiorly at L3 are likely Reactive. 3. Remote superior endplate fractures at F57 and T12. 4. Mild subarticular and foraminal narrowing bilaterally at L3-4 greater than L4-5. Narrowing is worse on the left at both levels.   Impression/Plan:  Ashley Mack has only back pain and no neurologic symptoms. Her exam is intact. I do recommend LSO brace for pain control given the more acute fractures. This should be worn at all times when upright. I would also recommend upright xrays in the brace to assess stability. No surgical intervention is recommended given the exam and imaging but may consider referral for possible kyphoplasty to help with pain control.    1.  Diagnosis: L2, L4 compression fractures  2.  Plan - LSO brace when upright - Upright lumbar xrays in brace - Referral for possible kyphoplasty

## 2017-08-09 NOTE — Progress Notes (Signed)
Patient ID: Ashley Mack, female   DOB: 12-02-1945, 72 y.o.   MRN: [id number]  [facility] PROGRESS NOTE  Ashley Mack YIR:[id number] DOB: Aug 29, 1945 DOA: 08/08/2017 PCP: Kirk Ruths, MD  HPI/Subjective: Patient in a lot of pain in her lower back.  She can hardly move around.  Patient's pain graded 10 out of 10 in intensity.  Objective: Vitals:   08/09/17 0100 08/09/17 0731  BP:  (!) 113/55  Pulse:  95  Resp:    Temp:  98.8 F (37.1 C)  SpO2: 93% 90%    Filed Weights   08/09/17 0256  Weight: 70.3 kg (155 lb)    ROS: Review of Systems  Constitutional: Negative for chills and fever.  Eyes: Negative for blurred vision.  Respiratory: Negative for cough and shortness of breath.   Cardiovascular: Negative for chest pain.  Gastrointestinal: Positive for nausea. Negative for abdominal pain, constipation, diarrhea and vomiting.  Genitourinary: Negative for dysuria.  Musculoskeletal: Positive for back pain. Negative for joint pain.  Neurological: Negative for dizziness and headaches.   Exam: Physical Exam  Constitutional: She is oriented to person, place, and time.  HENT:  Nose: No mucosal edema.  Mouth/Throat: No oropharyngeal exudate or posterior oropharyngeal edema.  Eyes: Pupils are equal, round, and reactive to light. Conjunctivae, EOM and lids are normal.  Neck: No JVD present. Carotid bruit is not present. No edema present. No thyroid mass and no thyromegaly present.  Cardiovascular: S1 normal and S2 normal. Exam reveals no gallop.  No murmur heard. Pulses:      Dorsalis pedis pulses are 2+ on the right side, and 2+ on the left side.  Respiratory: No respiratory distress. She has no wheezes. She has no rhonchi. She has no rales.  GI: Soft. Bowel sounds are normal. There is no tenderness.  Musculoskeletal:       Right ankle: She exhibits no swelling.       Left ankle: She exhibits no swelling.  Lymphadenopathy:    She has no cervical adenopathy.   Neurological: She is alert and oriented to person, place, and time. No cranial nerve deficit.  Patient barely able to straight leg raise with her legs bilaterally.  Only about 10 degrees.  Sensation intact bilateral feet.  Able to flex and extend at the ankles.  Skin: Skin is warm. No rash noted. Nails show no clubbing.  Psychiatric: She has a normal mood and affect.      Data Reviewed: Basic Metabolic Panel: Recent Labs  Lab 08/06/17 1121 08/07/17 0458 08/08/17 0746 08/09/17 0427  NA 135 136 132* 133*  K 3.4* 3.5 3.8 3.5  CL 98* 102 97* 99*  CO2 26 28 26 26   GLUCOSE 127* 111* 96 74  BUN 11 12 19 20   CREATININE 0.68 0.78 0.81 0.85  CALCIUM 9.0 8.4* 8.7* 8.1*   Liver Function Tests: Recent Labs  Lab 08/06/17 1121 08/07/17 0458 08/09/17 0427  AST 22 16 15   ALT 17 13* 13*  ALKPHOS 82 69 60  BILITOT 0.7 0.6 0.9  PROT 7.3 6.4* 5.9*  ALBUMIN 4.3 3.5 3.1*   Recent Labs  Lab 08/06/17 1121  LIPASE 34   CBC: Recent Labs  Lab 08/06/17 1121 08/07/17 0458 08/08/17 0746 08/09/17 0427  WBC 12.8* 8.7 14.5* 11.1*  NEUTROABS 11.9*  --   --   --   HGB 12.8 11.9* 12.5 10.9*  HCT 38.6 34.5* 36.0 32.3*  MCV 87.5 88.2 87.1 89.4  PLT 388 350 337  301     Studies: Ct Lumbar Spine Wo Contrast  Result Date: 08/08/2017 CLINICAL DATA:  Low back pain EXAM: CT LUMBAR SPINE WITHOUT CONTRAST TECHNIQUE: Multidetector CT imaging of the lumbar spine was performed without intravenous contrast administration. Multiplanar CT image reconstructions were also generated. COMPARISON:  08/06/2017 and 03/19/2016 FINDINGS: Segmentation: The lowest lumbar type non-rib-bearing vertebra is labeled as L5. Alignment: No vertebral subluxation is observed. Vertebrae: Old 30% T11 and 40% T12 compression fractures are present. There is an acute 35% superior endplate compression fracture at L2 which was not present on 08/06/2017 (2 days ago), and this has 3 mm of associated posterior bony retropulsion. 30%  superior endplate compression fracture at L4 is stable from 08/06/2017 but was not present on 03/19/2016, and has sclerosis along the upper endplate suggesting subacute chronicity. Paraspinal and other soft tissues: Aortoiliac atherosclerotic vascular disease. Small to moderate type 1 hiatal hernia. Disc levels: No overt impingement observed. IMPRESSION: 1. Acute 35% superior endplate compression fracture at L2 with 3 mm of posterior bony retropulsion. This fracture was not present 2 days ago. 2. 30% superior endplate compression fracture at L4 has imaging characteristics of a subacute fracture, but was present on 08/06/2017. 3. Old compression fractures at T11 and T12. 4. These lumbar compression fractures indicate osteoporosis. 5.  Aortic Atherosclerosis (ICD10-I70.0). 6. Small to moderate type 1 hiatal hernia. Electronically Signed   By: Van Clines M.D.   On: 08/08/2017 09:16   Mr Lumbar Spine Wo Contrast  Result Date: 08/08/2017 CLINICAL DATA:  Low back pain.  Lumbar spine fractures. EXAM: MRI LUMBAR SPINE WITHOUT CONTRAST TECHNIQUE: Multiplanar, multisequence MR imaging of the lumbar spine was performed. No intravenous contrast was administered. COMPARISON:  CT of the lumbar spine from the same day. CT abdomen and pelvis 08/06/2017. MRI of the lumbar spine 03/19/2016. FINDINGS: Segmentation: 5 non rib-bearing lumbar type vertebral bodies are present. Alignment:  AP alignment is anatomic. Vertebrae: Remote superior endplate fractures are present at T11 and T12. A superior endplate fracture of L2 is again noted with 35% superior compression. Superior endplate fracture at L4 measures approximately 30%. Signal characteristics suggests the L2 fracture is more acute. The L4 fracture is incompletely healed. Anterior inferior sclerotic changes are present at L3. Normal signal is present at L5. Vertebral body heights are preserved at L1, L3, and L5. Conus medullaris and cauda equina: Conus extends to the L1  level. Conus and cauda equina appear normal. Paraspinal and other soft tissues: Limited imaging of the abdomen is unremarkable. Disc levels: T11-12: Minimal retropulsed bone is present without significant stenosis. T12-L1: Negative. L1-2: No significant retropulsed bone is present. There is no focal disc protrusion or stenosis. L2-3: No significant focal disc protrusion or stenosis is present. L3-4: A mild broad-based disc protrusion is present. Mild facet hypertrophy and ligamentum flavum thickening is noted. Mild subarticular and foraminal narrowing is present bilaterally, worse on the left. L4-5: A mild broad-based disc bulge is present. Mild facet hypertrophy is noted bilaterally. This results in mild subarticular and foraminal narrowing bilaterally, worse on the left. L5-S1: Negative. IMPRESSION: 1. Superior endplate fractures at L2 and L4 as previously described. The L2 fracture is more acute. Neither fracture is completely healed. 2. Marrow changes anteriorly and inferiorly at L3 are likely reactive. 3. Remote superior endplate fractures at C58 and T12. 4. Mild subarticular and foraminal narrowing bilaterally at L3-4 greater than L4-5. Narrowing is worse on the left at both levels. Electronically Signed   By: San Morelle  M.D.   On: 08/08/2017 12:15    Scheduled Meds: . Derrill Memo ON 08/10/2017] ceFAZolin  1 g Other To OR  . cyclobenzaprine  10 mg Oral Q8H  . docusate sodium  100 mg Oral BID  . fluticasone  2 spray Each Nare Daily  . INTEGRA  1 capsule Oral Daily  . mometasone-formoterol  2 puff Inhalation BID  . pantoprazole  40 mg Oral Daily  . predniSONE  5 mg Oral Daily  . traMADol  100 mg Oral Q6H   Continuous Infusions: . sodium chloride 75 mL/hr (08/09/17 0653)    Assessment/Plan:  1. Acute L2 compression fracture.  Seen by Dr. Youlanda Mighty and the plan is for kyphoplasty at L2 tomorrow.  Hold heparin subcutaneous injections.  EKG preop. 2. Rheumatoid arthritis on chronic  prednisone 3. GERD on Protonix 4. COPD.  Respiratory status stable  Code Status:     Code Status Orders  (From admission, onward)        Start     Ordered   08/08/17 1533  Full code  Continuous     08/08/17 1532    Code Status History    Date Active Date Inactive Code Status Order ID Comments User Context   08/06/2017 1659 08/07/2017 1307 Full Code [id number]  Vickie Epley, MD ED    Advance Directive Documentation     Most Recent Value  Type of Advance Directive  Living will  Pre-existing out of facility DNR order (yellow form or pink MOST form)  -  "MOST" Form in Place?  -     Family Communication: Family at the bedside Disposition Plan: To be determined  Consultants:  Orthopedic surgery  Time spent: 28 minutes  [place]

## 2017-08-09 NOTE — Clinical Social Work Note (Signed)
Clinical Social Work Assessment  Patient Details  Name: Ashley Mack MRN: [id number] Date of Birth: 01/21/1946  Date of referral:  08/09/17               Reason for consult:  Facility Placement                Permission sought to share information with:    Permission granted to share information::     Name::        Agency::     Relationship::     Contact Information:     Housing/Transportation Living arrangements for the past 2 months:  Single Family Home Source of Information:  Patient Patient Interpreter Needed:  None Criminal Activity/Legal Involvement Pertinent to Current Situation/Hospitalization:  No - Comment as needed Significant Relationships:  Friend, Neighbor Lives with:  Self Do you feel safe going back to the place where you live?  Yes Need for family participation in patient care:  Yes (Comment)  Care giving concerns:  Patient lives in [place] alone.    Social Worker assessment / plan:  Holiday representative (CSW) reviewed chart and noted that patient has a compression fracture. PT is pending. CSW met with patient and her neighbor/ friend Olen Cordial was at bedside. Patient was alert and oriented X4 and was laying in the bed. CSW introduced self and explained role of CSW department. Patient reported that she lives alone and has no children and no HPOA. Per patient her neighbor Olen Cordial and his sister assist her when needed. Per patient she can call her neighbors 24/7. Per patient she also has 4 step children that are supportive. Patient reported that her vision is impaired and she has not drove since 2010. CSW explained that PT will evaluate patient and make a recommendation of home health or SNF. Patient reported that she does not want to go to SNF and has plenty of help and support at home. CSW will continue to follow and assist as needed.   Employment status:  Disabled (Comment on whether or not currently receiving Disability) Insurance information:  Medicare PT  Recommendations:  Not assessed at this time Information / Referral to community resources:  Other (Comment Required)(Patient prefers to go home. )  Patient/Family's Response to care:  Patient prefers to go home.   Patient/Family's Understanding of and Emotional Response to Diagnosis, Current Treatment, and Prognosis:  Patient was pleasant and thanked CSW for assistance.   Emotional Assessment Appearance:  Appears stated age Attitude/Demeanor/Rapport:    Affect (typically observed):  Pleasant Orientation:  Oriented to Self, Oriented to Place, Oriented to  Time, Oriented to Situation Alcohol / Substance use:  Not Applicable Psych involvement (Current and /or in the community):  No (Comment)  Discharge Needs  Concerns to be addressed:  Discharge Planning Concerns Readmission within the last 30 days:  No Current discharge risk:  Dependent with Mobility Barriers to Discharge:  Continued Medical Work up   [employer], Veronia Beets, LCSW 08/09/2017, 12:45 PM

## 2017-08-10 ENCOUNTER — Inpatient Hospital Stay: Payer: Medicare Other | Admitting: Anesthesiology

## 2017-08-10 ENCOUNTER — Inpatient Hospital Stay: Payer: Medicare Other

## 2017-08-10 ENCOUNTER — Encounter
Admission: EM | Disposition: A | Discharge status: Home / Self Care | Payer: Self-pay | Source: Home / Self Care | Attending: Internal Medicine

## 2017-08-10 ENCOUNTER — Encounter: Payer: Self-pay | Admitting: Anesthesiology

## 2017-08-10 HISTORY — PX: KYPHOPLASTY: SHX5884

## 2017-08-10 SURGERY — KYPHOPLASTY
Anesthesia: Monitor Anesthesia Care | Site: Spine Lumbar | Wound class: Clean

## 2017-08-10 MED ORDER — ONDANSETRON HCL 4 MG/2ML IJ SOLN
INTRAMUSCULAR | Status: DC | PRN
  Administered 2017-08-10: 4 mg via INTRAVENOUS

## 2017-08-10 MED ORDER — SENNOSIDES-DOCUSATE SODIUM 8.6-50 MG PO TABS
2.0000 | ORAL_TABLET | Freq: Two times a day (BID) | ORAL | Status: DC
  Administered 2017-08-11: 2 via ORAL
  Filled 2017-08-10: qty 2

## 2017-08-10 MED ORDER — CYCLOBENZAPRINE HCL 10 MG PO TABS: 5.0000 mg | ORAL_TABLET | Freq: Three times a day (TID) | ORAL | Status: DC | PRN

## 2017-08-10 MED ORDER — IOPAMIDOL (ISOVUE-M 200) INJECTION 41%
INTRAMUSCULAR | Status: DC | PRN
  Administered 2017-08-10: 20 mL

## 2017-08-10 MED ORDER — KETAMINE HCL 50 MG/ML IJ SOLN
INTRAMUSCULAR | Status: DC | PRN
  Administered 2017-08-10: 35 mg via INTRAMUSCULAR
  Administered 2017-08-10: 15 mg via INTRAMUSCULAR

## 2017-08-10 MED ORDER — BUPIVACAINE-EPINEPHRINE (PF) 0.5% -1:200000 IJ SOLN
INTRAMUSCULAR | Status: DC | PRN
  Administered 2017-08-10: 15 mL

## 2017-08-10 MED ORDER — LACTATED RINGERS IV SOLN
INTRAVENOUS | Status: DC | PRN
  Administered 2017-08-10: 17:00:00 via INTRAVENOUS

## 2017-08-10 MED ORDER — ONDANSETRON HCL 4 MG/2ML IJ SOLN: 4.0000 mg | Freq: Once | INTRAMUSCULAR | Status: DC | PRN

## 2017-08-10 MED ORDER — LIDOCAINE HCL 1 % IJ SOLN
INTRAMUSCULAR | Status: DC | PRN
  Administered 2017-08-10: 25 mL

## 2017-08-10 MED ORDER — PROPOFOL 500 MG/50ML IV EMUL
INTRAVENOUS | Status: DC | PRN
  Administered 2017-08-10: 25 ug/kg/min via INTRAVENOUS

## 2017-08-10 MED ORDER — FENTANYL CITRATE (PF) 100 MCG/2ML IJ SOLN: 25.0000 ug | INTRAMUSCULAR | Status: DC | PRN

## 2017-08-10 MED ORDER — MIDAZOLAM HCL 2 MG/2ML IJ SOLN
INTRAMUSCULAR | Status: DC | PRN
  Administered 2017-08-10: 2 mg via INTRAVENOUS

## 2017-08-10 SURGICAL SUPPLY — 16 items
CEMENT KYPHON CX01A KIT/MIXER (Cement) ×3 IMPLANT
DERMABOND ADVANCED (GAUZE/BANDAGES/DRESSINGS) ×2
DERMABOND ADVANCED .7 DNX12 (GAUZE/BANDAGES/DRESSINGS) ×1 IMPLANT
DEVICE BIOPSY BONE KYPHX (INSTRUMENTS) ×3 IMPLANT
DRAPE C-ARM XRAY 36X54 (DRAPES) ×3 IMPLANT
DURAPREP 26ML APPLICATOR (WOUND CARE) ×3 IMPLANT
GLOVE SURG SYN 9.0  PF PI (GLOVE) ×2
GLOVE SURG SYN 9.0 PF PI (GLOVE) ×1 IMPLANT
GOWN SRG 2XL LVL 4 RGLN SLV (GOWNS) ×1 IMPLANT
GOWN STRL NON-REIN 2XL LVL4 (GOWNS) ×2
GOWN STRL REUS W/ TWL LRG LVL3 (GOWN DISPOSABLE) ×1 IMPLANT
GOWN STRL REUS W/TWL LRG LVL3 (GOWN DISPOSABLE) ×2
PACK KYPHOPLASTY (MISCELLANEOUS) ×3 IMPLANT
STRAP SAFETY 5IN WIDE (MISCELLANEOUS) ×3 IMPLANT
TRAY KYPHOPAK 15/3 EXPRESS 1ST (MISCELLANEOUS) ×3 IMPLANT
TRAY KYPHOPAK 20/3 EXPRESS 1ST (MISCELLANEOUS) IMPLANT

## 2017-08-10 NOTE — Op Note (Signed)
08/10/2017  5:56 PM  PATIENT:  Ashley Mack  72 y.o. female  PRE-OPERATIVE DIAGNOSIS:  n/a  POST-OPERATIVE DIAGNOSIS:  compression fracture  PROCEDURE:  Procedure(s): KYPHOPLASTY-L2 (N/A)  SURGEON: Laurene Footman, MD  ASSISTANTS: none  ANESTHESIA:   local and MAC  EBL:  No intake/output data recorded.  BLOOD ADMINISTERED:none  DRAINS: none   LOCAL MEDICATIONS USED:  MARCAINE    and XYLOCAINE   SPECIMEN:  No Specimen  DISPOSITION OF SPECIMEN:  N/A  COUNTS:  YES  TOURNIQUET:  * No tourniquets in log *  IMPLANTS: bone cement  DICTATION: .Dragon Dictation patient was brought to the operating room and after adequate sedation was obtained patient was placed prone.  Serum was brought in good visualization and AP lateral projections of L2 were obtained.  After patient identification and timeout procedures were completed local anesthetic was infiltrated subcutaneously.  The back then prepped and draped you sterile fashion timeout procedure carried out.  Spinal needle was used to get local down to the pedicle on the right side using a mixture of 1% Xylocaine half percent Sensorcaine with epinephrine approximately 20 cc of each.  After allowing this to set a small incision was made and a trocar was advanced in an extrapedicular fashion into the vertebral body with no biopsy being obtainable.  Drilling was carried out followed by inflation of the balloon to 3-1/2 cc and then filling of the space when the cement was appropriate consistency of 4 cc of bone cement.  There is no extravasation and good fill from superior to inferior endplates left to right sides.  When the cement was hard enough the trochars removed and the skin closed with Dermabond  PLAN OF CARE: Continue as inpatient  PATIENT DISPOSITION:  PACU - hemodynamically stable.

## 2017-08-10 NOTE — Anesthesia Preprocedure Evaluation (Addendum)
Anesthesia Evaluation  Patient identified by MRN, date of birth, ID band Patient awake    Reviewed: Allergy & Precautions, NPO status , Patient's Chart, lab work & pertinent test results, reviewed documented beta blocker date and time   Airway Mallampati: III  TM Distance: >3 FB     Dental  (+) Upper Dentures, Lower Dentures   Pulmonary shortness of breath, COPD, former smoker,           Cardiovascular      Neuro/Psych  Headaches,    GI/Hepatic GERD  Controlled,  Endo/Other    Renal/GU Renal disease     Musculoskeletal  (+) Arthritis ,   Abdominal   Peds  Hematology  (+) anemia ,   Anesthesia Other Findings EKG ok. Pt states she can take hydrocodones or hydromorphones.  Reproductive/Obstetrics                           Anesthesia Physical Anesthesia Plan  ASA: III  Anesthesia Plan: MAC   Post-op Pain Management:    Induction:   PONV Risk Score and Plan:   Airway Management Planned:   Additional Equipment:   Intra-op Plan:   Post-operative Plan:   Informed Consent: I have reviewed the patients History and Physical, chart, labs and discussed the procedure including the risks, benefits and alternatives for the proposed anesthesia with the patient or authorized representative who has indicated his/her understanding and acceptance.     Plan Discussed with: CRNA  Anesthesia Plan Comments:        Anesthesia Quick Evaluation

## 2017-08-10 NOTE — Progress Notes (Signed)
Discussed treatment, L2 is her symptomatic level, L4 pain has nearly resolved. Plan L2 kyphoplasty sometime today.

## 2017-08-10 NOTE — Transfer of Care (Signed)
Immediate Anesthesia Transfer of Care Note  Patient: Delbert Phenix  Procedure(s) Performed: Hewitt Shorts (N/A Spine Lumbar)  Patient Location: PACU  Anesthesia Type:MAC  Level of Consciousness: awake and alert   Airway & Oxygen Therapy: Patient connected to nasal cannula oxygen  Post-op Assessment: Post -op Vital signs reviewed and stable  Post vital signs: stable  Last Vitals:  Vitals Value Taken Time  BP 145/75 08/10/2017  5:54 PM  Temp 36.7 C 08/10/2017  5:54 PM  Pulse 101 08/10/2017  5:56 PM  Resp 13 08/10/2017  5:56 PM  SpO2 97 % 08/10/2017  5:56 PM  Vitals shown include unvalidated device data.  Last Pain:  Vitals:   08/10/17 1443  TempSrc: Oral  PainSc:       Patients Stated Pain Goal: 5 (41/58/30 9407)  Complications: No apparent anesthesia complications

## 2017-08-10 NOTE — Progress Notes (Signed)
PT Cancellation Note  Patient Details Name: Ashley Mack MRN: [id number] DOB: 12-05-1945   Cancelled Treatment:    Reason Eval/Treat Not Completed: Other (comment). Per notes, pt scheduled for L2 kyphoplasty this date, approx after 5PM. Will hold therapy and see tomorrow s/p Sx.   Rhonda Linan 08/10/2017, 9:40 AM  Greggory Stallion, PT, DPT [phone]

## 2017-08-10 NOTE — Anesthesia Postprocedure Evaluation (Signed)
Anesthesia Post Note  Patient: Ashley Mack  Procedure(s) Performed: Hewitt Shorts (N/A Spine Lumbar)  Patient location during evaluation: PACU Anesthesia Type: MAC Level of consciousness: awake and alert Pain management: pain level controlled Vital Signs Assessment: post-procedure vital signs reviewed and stable Respiratory status: spontaneous breathing, nonlabored ventilation, respiratory function stable and patient connected to nasal cannula oxygen Cardiovascular status: stable and blood pressure returned to baseline Postop Assessment: no apparent nausea or vomiting Anesthetic complications: no     Last Vitals:  Vitals:   08/10/17 1824 08/10/17 1848  BP: (!) 136/55 137/68  Pulse: 97 100  Resp: 16   Temp: 37.2 C 36.7 C  SpO2: 96% 93%    Last Pain:  Vitals:   08/10/17 2003  TempSrc:   PainSc: [place]

## 2017-08-10 NOTE — Progress Notes (Signed)
Patient ID: Ashley Mack, female   DOB: 13-Nov-1945, 72 y.o.   MRN: [id number]  [facility] PROGRESS NOTE  KELLIANNE EK VOH:[id number] DOB: 08-10-1945 DOA: 08/08/2017 PCP: Kirk Ruths, MD  HPI/Subjective: Patient states her back pain is 15 out of 10.  She states that the worst pain she is ever had.  She can hardly move or do anything without severe pain.  Objective: Vitals:   08/10/17 0730 08/10/17 1443  BP: (!) 155/65 (!) 134/55  Pulse: 96 100  Resp: 18 18  Temp: 97.8 F (36.6 C) 99.2 F (37.3 C)  SpO2: 91% 93%    Filed Weights   08/09/17 0256 08/10/17 0559  Weight: 70.3 kg (155 lb) 71.7 kg (158 lb)    ROS: Review of Systems  Constitutional: Negative for chills and fever.  Eyes: Negative for blurred vision.  Respiratory: Negative for cough and shortness of breath.   Cardiovascular: Negative for chest pain.  Gastrointestinal: Positive for nausea. Negative for abdominal pain, constipation, diarrhea and vomiting.  Genitourinary: Negative for dysuria.  Musculoskeletal: Positive for back pain. Negative for joint pain.  Neurological: Negative for dizziness and headaches.   Exam: Physical Exam  Constitutional: She is oriented to person, place, and time.  HENT:  Nose: No mucosal edema.  Mouth/Throat: No oropharyngeal exudate or posterior oropharyngeal edema.  Eyes: Pupils are equal, round, and reactive to light. Conjunctivae, EOM and lids are normal.  Neck: No JVD present. Carotid bruit is not present. No edema present. No thyroid mass and no thyromegaly present.  Cardiovascular: S1 normal and S2 normal. Exam reveals no gallop.  No murmur heard. Pulses:      Dorsalis pedis pulses are 2+ on the right side, and 2+ on the left side.  Respiratory: No respiratory distress. She has no wheezes. She has no rhonchi. She has no rales.  GI: Soft. Bowel sounds are normal. There is no tenderness.  Musculoskeletal:       Right ankle: She exhibits no swelling.       Left  ankle: She exhibits no swelling.  Lymphadenopathy:    She has no cervical adenopathy.  Neurological: She is alert and oriented to person, place, and time. No cranial nerve deficit.  Patient barely able to straight leg raise with her legs bilaterally.  Only about 10 degrees.  Sensation intact bilateral feet.  Able to flex and extend at the ankles.  Skin: Skin is warm. No rash noted. Nails show no clubbing.  Psychiatric: She has a normal mood and affect.      Data Reviewed: Basic Metabolic Panel: Recent Labs  Lab 08/06/17 1121 08/07/17 0458 08/08/17 0746 08/09/17 0427  NA 135 136 132* 133*  K 3.4* 3.5 3.8 3.5  CL 98* 102 97* 99*  CO2 26 28 26 26   GLUCOSE 127* 111* 96 74  BUN 11 12 19 20   CREATININE 0.68 0.78 0.81 0.85  CALCIUM 9.0 8.4* 8.7* 8.1*   Liver Function Tests: Recent Labs  Lab 08/06/17 1121 08/07/17 0458 08/09/17 0427  AST 22 16 15   ALT 17 13* 13*  ALKPHOS 82 69 60  BILITOT 0.7 0.6 0.9  PROT 7.3 6.4* 5.9*  ALBUMIN 4.3 3.5 3.1*   Recent Labs  Lab 08/06/17 1121  LIPASE 34   CBC: Recent Labs  Lab 08/06/17 1121 08/07/17 0458 08/08/17 0746 08/09/17 0427  WBC 12.8* 8.7 14.5* 11.1*  NEUTROABS 11.9*  --   --   --   HGB 12.8 11.9* 12.5 10.9*  HCT 38.6 34.5* 36.0 32.3*  MCV 87.5 88.2 87.1 89.4  PLT 388 350 337 301    Scheduled Meds: . ceFAZolin  1 g Other To OR  . cyclobenzaprine  10 mg Oral Q8H  . fluticasone  2 spray Each Nare Daily  . INTEGRA  1 capsule Oral Daily  . mometasone-formoterol  2 puff Inhalation BID  . pantoprazole  40 mg Oral Daily  . predniSONE  5 mg Oral Daily  . [START ON 08/11/2017] senna-docusate  2 tablet Oral BID  . traMADol  100 mg Oral Q6H   Continuous Infusions: . sodium chloride 40 mL/hr (08/10/17 0152)    Assessment/Plan:  1. Preoperative consultation for acute L2 compression fracture.  No contraindications to surgery at this time.  I think surgery needs to be done secondary to the patient's severe pain and unable  to function.  EKG reviewed.  Seen by Dr. Rudene Christians and the plan is for kyphoplasty at L2 this evening.   2. Rheumatoid arthritis on chronic prednisone.  Consider bisphosphonate or IV stat as outpatient 3. GERD on Protonix 4. COPD.  Respiratory status stable  Code Status:     Code Status Orders  (From admission, onward)        Start     Ordered   08/08/17 1533  Full code  Continuous     08/08/17 1532    Code Status History    Date Active Date Inactive Code Status Order ID Comments User Context   08/06/2017 1659 08/07/2017 1307 Full Code [id number]  Vickie Epley, MD ED    Advance Directive Documentation     Most Recent Value  Type of Advance Directive  Living will  Pre-existing out of facility DNR order (yellow form or pink MOST form)  -  "MOST" Form in Place?  -     Family Communication: Family yesterday Disposition Plan: Potentially home with home health tomorrow depending on how she is doing  Consultants:  Orthopedic surgery  Time spent: 27 minutes  [place]

## 2017-08-10 NOTE — Anesthesia Post-op Follow-up Note (Signed)
Anesthesia QCDR form completed.        

## 2017-08-11 ENCOUNTER — Encounter: Payer: Self-pay | Admitting: Orthopedic Surgery

## 2017-08-11 MED ORDER — HYDROCODONE-ACETAMINOPHEN 5-325 MG PO TABS: 1.0000 | ORAL_TABLET | Freq: Four times a day (QID) | ORAL | 0 refills | Status: DC | PRN

## 2017-08-11 MED ORDER — DOCUSATE SODIUM 100 MG PO CAPS: 100.0000 mg | ORAL_CAPSULE | Freq: Every day | ORAL | 0 refills | Status: DC | PRN

## 2017-08-11 MED ORDER — MAGNESIUM CITRATE PO SOLN
1.0000 | Freq: Once | ORAL | Status: AC
Start: 2017-08-11 — End: 2017-08-11
  Administered 2017-08-11: 1 via ORAL
  Filled 2017-08-11: qty 296

## 2017-08-11 NOTE — Care Management Important Message (Signed)
Important Message  Patient Details  Name: Ashley Mack MRN: [id number] Date of Birth: 1945/06/03   Medicare Important Message Given:  Yes    Juliann Pulse A Stevan Eberwein 08/11/2017, 9:39 AM

## 2017-08-11 NOTE — Progress Notes (Signed)
Discharge summary reviewed verbal understanding. 1 narcotic Rx given upon discharge. Escorted to personal vehicle

## 2017-08-11 NOTE — Discharge Summary (Signed)
[facility] [place] NAME: Ashley Mack    MR#:  [id number]  DATE OF BIRTH:  [birth year]  DATE OF ADMISSION:  08/08/2017 ADMITTING PHYSICIAN: Idelle Crouch, MD  DATE OF DISCHARGE: No discharge date for patient encounter.  PRIMARY CARE PHYSICIAN: Kirk Ruths, MD    ADMISSION DIAGNOSIS:  low back pain  DISCHARGE DIAGNOSIS:  Principal Problem:   Compression fracture of lumbar vertebra (HCC) Active Problems:   B12 deficiency   Iron deficiency anemia   Intractable back pain   SECONDARY DIAGNOSIS:   Past Medical History:  Diagnosis Date  . Anemia   . Arthritis    Rheumatoid  . [place]y of gallbladder   . Centrilobular emphysema ([place])   . Dyspnea   . Fatty liver   . GERD (gastroesophageal reflux disease)   . History of fractured vertebra    sees chiropractor  . 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 Iron (Fe) deficiency anemia   . Migraine headache    in past  . Migraines   . Motion sickness   . 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 Osteoporosis   . Rheumatoid arthritis ([place])   . Vertigo     HOSPITAL COURSE:  1. acute L2 compression fracture Status post L2 vertebroplasty by orthopedic surgery/Dr. Rudene Christians Patient refuses home health physical therapy status post discharge Pain controlled on current regiment  2. Rheumatoid arthritis  Stable on current regiment  Follow-up with primary care provider for continued medical management   DISCHARGE CONDITIONS:  Stable  CONSULTS OBTAINED:  Treatment Team:  Hessie Knows, MD Jaymes Graff, DO Deetta Perla, MD Salary, Avel Peace, MD  DRUG ALLERGIES:   Allergies  Allergen Reactions  . Hydrocodone-Acetaminophen Nausea And Vomiting    Dizziness  . Meloxicam Nausea And Vomiting  . Other Nausea Only    Arthritis medications   . Oxycodone     Dizzy, foggy feeling  . Promethazine Hcl Other (See Comments)  . Propoxyphene Nausea Only  . Ranitidine Hives  .  Sulfasalazine Other (See Comments)    Stomach upset  . Topiramate Nausea Only    DISCHARGE MEDICATIONS:   Allergies as of 08/11/2017      Reactions   Hydrocodone-acetaminophen Nausea And Vomiting   Dizziness   Meloxicam Nausea And Vomiting   Other Nausea Only   Arthritis medications    Oxycodone    Dizzy, foggy feeling   Promethazine Hcl Other (See Comments)   Propoxyphene Nausea Only   Ranitidine Hives   Sulfasalazine Other (See Comments)   Stomach upset   Topiramate Nausea Only      Medication List    TAKE these medications   cyclobenzaprine 5 MG tablet Commonly known as:  FLEXERIL Take 5 mg by mouth 3 (three) times daily as needed.   DEXILANT 60 MG capsule Generic drug:  dexlansoprazole Take 1 capsule by mouth daily.   docusate sodium 100 MG capsule Commonly known as:  COLACE Take 1 capsule (100 mg total) by mouth daily as needed.   fluticasone 50 MCG/ACT nasal spray Commonly known as:  FLONASE Place into both nostrils daily as needed for allergies or rhinitis.   HYDROcodone-acetaminophen 5-325 MG tablet Commonly known as:  NORCO/VICODIN Take 1 tablet by mouth every 6 (six) hours as needed for severe pain.   INTEGRA 62.5-62.5-40-3 MG Caps Take 1 capsule by mouth daily.   naproxen sodium 220 MG tablet Commonly known as:  ALEVE Take  220 mg by mouth as needed.   ondansetron 4 MG tablet Commonly known as:  ZOFRAN Take 1 tablet (4 mg total) by mouth daily as needed for up to 20 doses (if needed for nausea with narcotic pain medication for back/flank pain).   predniSONE 5 MG tablet Commonly known as:  DELTASONE Take 5 mg by mouth daily.   SYMBICORT 80-4.5 MCG/ACT inhaler Generic drug:  budesonide-formoterol Inhale 2 puffs into the lungs every morning.   traMADol-acetaminophen 37.5-325 MG tablet Commonly known as:  ULTRACET Take 1 tablet by mouth every 6 (six) hours as needed (for back/flank pain).   Vitamin D3 2000 units capsule Take 1 capsule by  mouth daily.        DISCHARGE INSTRUCTIONS:  If you experience worsening of your admission symptoms, develop shortness of breath, life threatening emergency, suicidal or homicidal thoughts you must seek medical attention immediately by calling 911 or calling your MD immediately  if symptoms less severe.  You Must read complete instructions/literature along with all the possible adverse reactions/side effects for all the Medicines you take and that have been prescribed to you. Take any new Medicines after you have completely understood and accept all the possible adverse reactions/side effects.   Please note  You were cared for by a hospitalist during your hospital stay. If you have any questions about your discharge medications or the care you received while you were in the hospital after you are discharged, you can call the unit and asked to speak with the hospitalist on call if the hospitalist that took care of you is not available. Once you are discharged, your primary care physician will handle any further medical issues. Please note that NO REFILLS for any discharge medications will be authorized once you are discharged, as it is imperative that you return to your primary care physician (or establish a relationship with a primary care physician if you do not have one) for your aftercare needs so that they can reassess your need for medications and monitor your lab values.    Today   CHIEF COMPLAINT:   Chief Complaint  Patient presents with  . Back Pain    HISTORY OF PRESENT ILLNESS:  72 y.o. female has a past medical history significant for COPD and anemia now with severe back pain not improved despite multiple doses of IV pain meds in ER. Cannot walk due to pain. MRI shows acute L2 compression fracture. She is now admitted for pain control.     VITAL SIGNS:  Blood pressure (!) 155/66, pulse (!) 108, temperature 97.7 F (36.5 C), temperature source Oral, resp. rate 18, height  5\' 2"  (1.575 m), weight 70.6 kg (155 lb 9.6 oz), SpO2 90 %.  I/O:    Intake/Output Summary (Last 24 hours) at 08/11/2017 1017 Last data filed at 08/11/2017 1000 Gross per 24 hour  Intake 767.33 ml  Output -  Net 767.33 ml    PHYSICAL EXAMINATION:  GENERAL:  72 y.o.-year-old patient lying in the bed with no acute distress.  EYES: Pupils equal, round, reactive to light and accommodation. No scleral icterus. Extraocular muscles intact.  HEENT: Head atraumatic, normocephalic. Oropharynx and nasopharynx clear.  NECK:  Supple, no jugular venous distention. No thyroid enlargement, no tenderness.  LUNGS: Normal breath sounds bilaterally, no wheezing, rales,rhonchi or crepitation. No use of accessory muscles of respiration.  CARDIOVASCULAR: S1, S2 normal. No murmurs, rubs, or gallops.  ABDOMEN: Soft, non-tender, non-distended. Bowel sounds present. No organomegaly or mass.  EXTREMITIES: No  pedal edema, cyanosis, or clubbing.  NEUROLOGIC: Cranial nerves II through XII are intact. Muscle strength 5/5 in all extremities. Sensation intact. Gait not checked.  PSYCHIATRIC: The patient is alert and oriented x 3.  SKIN: No obvious rash, lesion, or ulcer.   DATA REVIEW:   CBC Recent Labs  Lab 08/09/17 0427  WBC 11.1*  HGB 10.9*  HCT 32.3*  PLT 301    Chemistries  Recent Labs  Lab 08/09/17 0427  NA 133*  K 3.5  CL 99*  CO2 26  GLUCOSE 74  BUN 20  CREATININE 0.85  CALCIUM 8.1*  AST 15  ALT 13*  ALKPHOS 60  BILITOT 0.9    Cardiac Enzymes No results for input(s): TROPONINI in the last 168 hours.  Microbiology Results  No results found for this or any previous visit.  RADIOLOGY:  Dg Lumbar Spine 2-3 Views  Result Date: 08/10/2017 CLINICAL DATA:  Kyphoplasty EXAM: LUMBAR SPINE - 2-3 VIEW; DG C-ARM 61-120 MIN FLUOROSCOPY TIME:  1 minutes 21 seconds COMPARISON:  None. FINDINGS: Intraoperative fluoroscopic radiographs during vertebral augmentation of a lumbar vertebral body,  likely L2 when correlating with prior lumbar spine MRI. IMPRESSION: Intraoperative fluoroscopic radiographs during lumbar vertebral augmentation, as above. Electronically Signed   By: Julian Hy M.D.   On: 08/10/2017 20:28   Dg C-arm 1-60 Min  Result Date: 08/10/2017 CLINICAL DATA:  Kyphoplasty EXAM: LUMBAR SPINE - 2-3 VIEW; DG C-ARM 61-120 MIN FLUOROSCOPY TIME:  1 minutes 21 seconds COMPARISON:  None. FINDINGS: Intraoperative fluoroscopic radiographs during vertebral augmentation of a lumbar vertebral body, likely L2 when correlating with prior lumbar spine MRI. IMPRESSION: Intraoperative fluoroscopic radiographs during lumbar vertebral augmentation, as above. Electronically Signed   By: Julian Hy M.D.   On: 08/10/2017 20:28    EKG:   Orders placed or performed during the hospital encounter of 08/08/17  . EKG 12-Lead  . EKG 12-Lead      Management plans discussed with the patient, family and they are in agreement.  CODE STATUS:     Code Status Orders  (From admission, onward)        Start     Ordered   08/08/17 1533  Full code  Continuous     08/08/17 1532    Code Status History    Date Active Date Inactive Code Status Order ID Comments User Context   08/06/2017 1659 08/07/2017 1307 Full Code [id number]  Vickie Epley, MD ED    Advance Directive Documentation     Most Recent Value  Type of Advance Directive  Living will  Pre-existing out of facility DNR order (yellow form or pink MOST form)  -  "MOST" Form in Place?  -      TOTAL TIME TAKING CARE OF THIS PATIENT: 45 minutes.    Avel Peace Salary M.D on 08/11/2017 at 10:17 AM  Between 7am to 6pm - Pager - [phone]  After 6pm go to [email or web address] - password [email or web address] [facility]  Office  [phone]  CC: Primary care physician; Kirk Ruths, MD   Note: This dictation was prepared with Dragon dictation along with smaller phrase technology. Any transcriptional errors  that result from this process are unintentional.

## 2017-08-11 NOTE — Evaluation (Signed)
Physical Therapy Evaluation Patient Details Name: Ashley Mack MRN: [id number] DOB: 1945/05/06 Today's Date: 08/11/2017   History of Present Illness  Pt admitted for fall on stairs now with compression fx at L2 and L4. Pt is now s/p kyphoplasty at L2 yesterday. HIstory includes COPD and anemia.  Clinical Impression  Pt is a pleasant 72 year old female who was admitted for compression fx and now s/p kyphoplasty on L2. Pt performs bed mobility with mod assist, transfers with min assist, and ambulation with min assist and use of RW and back brace. Brace rides up once standing, educated pt on making brace tighter to help with pain control. Pt demonstrates deficits with strength/mobility/pain. Educated on back precautions and HEP. Due to visual impairments, did not give written HEP. Pt reports she was previously indep, however missed a step at home and fell due to visual impairment. She needs hands on assist for all mobility and is high falls risk. Educated on use of brace at all times with OOB. Pt is currently not at baseline however refusing follow up PT. Would benefit from skilled PT to address above deficits and promote optimal return to PLOF; recommend transition to STR upon discharge from acute hospitalization.       Follow Up Recommendations SNF(pt currently refusing)    Equipment Recommendations  Rolling walker with 5" wheels    Recommendations for Other Services       Precautions / Restrictions Precautions Precautions: Fall Restrictions Weight Bearing Restrictions: Yes Other Position/Activity Restrictions: WBAT to B LE      Mobility  Bed Mobility Overal bed mobility: Needs Assistance Bed Mobility: Supine to Sit;Rolling Rolling: Min assist   Supine to sit: Mod assist     General bed mobility comments: educated on log rolling. Cues for back precautions. Needs mod assist for supine>sit with assist needed for trunk support. Severe pain with any movement, yelling out with pain.  Pain appears to decrease once seated at EOB.  Transfers Overall transfer level: Needs assistance Equipment used: Rolling walker (2 wheeled) Transfers: Sit to/from Stand Sit to Stand: Min assist         General transfer comment: needs assist to transfer from sit<>stand using RW. Prior to transfers, brace donned. Once standing upright posture noted  Ambulation/Gait Ambulation/Gait assistance: Min assist Ambulation Distance (Feet): 3 Feet Assistive device: Rolling walker (2 wheeled) Gait Pattern/deviations: Step-to pattern     General Gait Details: antalgic gait pattern and slow speed noted as pt ambulated bed>BSC>chair. Needs hands on assist for balance and cues for sequencing. Due to pain, unable to further ambulate in room  Stairs            Wheelchair Mobility    Modified Rankin (Stroke Patients Only)       Balance Overall balance assessment: History of Falls;Needs assistance Sitting-balance support: Feet supported Sitting balance-Leahy Scale: Good     Standing balance support: Bilateral upper extremity supported Standing balance-Leahy Scale: Fair                               Pertinent Vitals/Pain Pain Assessment: 0-10 Pain Score: 9  Pain Location: low back Pain Descriptors / Indicators: Operative site guarding Pain Intervention(s): Limited activity within patient's tolerance;RN gave pain meds during session;Repositioned    Home Living Family/patient expects to be discharged to:: Private residence Living Arrangements: Non-relatives/Friends Available Help at Discharge: Available 24 hours/day Type of Home: House Home Access: Stairs to enter  Entrance Stairs-Rails: None Entrance Stairs-Number of Steps: 3 Home Layout: One level Home Equipment: Walker - 4 wheels      Prior Function Level of Independence: Independent         Comments: prior to admission, independent without AD.      Hand Dominance        Extremity/Trunk Assessment    Upper Extremity Assessment Upper Extremity Assessment: Overall WFL for tasks assessed    Lower Extremity Assessment Lower Extremity Assessment: Generalized weakness(B LE grossly 4+/5)       Communication   Communication: No difficulties  Cognition Arousal/Alertness: Awake/alert Behavior During Therapy: WFL for tasks assessed/performed Overall Cognitive Status: Within Functional Limits for tasks assessed                                        General Comments      Exercises Other Exercises Other Exercises: supine ther-ex performed on B LE including ankle pumps, quad sets, hip abd/add, and attempted SLRs. ALl ther-ex performed x 10 reps with cga for assistance Other Exercises: Pt ambulated 3' to [facility], needed min assist for set up and hygiene. Assistance needed for transfers.   Assessment/Plan    PT Assessment Patient needs continued PT services  PT Problem List Decreased strength;Decreased activity tolerance;Decreased balance;Decreased mobility;Pain;Decreased safety awareness       PT Treatment Interventions DME instruction;Gait training;Stair training;Therapeutic exercise    PT Goals (Current goals can be found in the Care Plan section)  Acute Rehab PT Goals Patient Stated Goal: to go home PT Goal Formulation: With patient Time For Goal Achievement: 08/25/17 Potential to Achieve Goals: Good    Frequency BID   Barriers to discharge        Co-evaluation               AM-PAC PT "6 Clicks" Daily Activity  Outcome Measure Difficulty turning over in bed (including adjusting bedclothes, sheets and blankets)?: Unable Difficulty moving from lying on back to sitting on the side of the bed? : Unable Difficulty sitting down on and standing up from a chair with arms (e.g., wheelchair, bedside commode, etc,.)?: Unable Help needed moving to and from a bed to chair (including a wheelchair)?: A Little Help needed walking in hospital room?: A Lot Help  needed climbing 3-5 steps with a railing? : Total 6 Click Score: 9    End of Session Equipment Utilized During Treatment: Gait belt Activity Tolerance: Patient limited by pain Patient left: in chair;with chair alarm set Nurse Communication: Mobility status PT Visit Diagnosis: Unsteadiness on feet (R26.81);Muscle weakness (generalized) (M62.81);History of falling (Z91.81);Difficulty in walking, not elsewhere classified (R26.2);Pain Pain - Right/Left: (back) Pain - part of body: (back)    Time: 4818-5631 PT Time Calculation (min) (ACUTE ONLY): 36 min   Charges:   PT Evaluation $PT Eval Low Complexity: 1 Low PT Treatments $Therapeutic Exercise: 8-22 mins $Therapeutic Activity: 8-22 mins   PT G Codes:        Greggory Stallion, PT, DPT [phone]   Ashley Mack 08/11/2017, 11:38 AM

## 2017-08-11 NOTE — Progress Notes (Signed)
PT is recommending SNF. Clinical Education officer, museum (CSW) met with patient alone at bedside to discuss D/C plan. Patient was alert and oriented X3 and was sitting up in the chair. CSW explained the benefits of SNF. Patient adamantly refused SNF and stated that she has assistance 24/7 at home. RN case manager aware of above.   [employer], LCSW [phone]

## 2017-08-11 NOTE — Care Management (Signed)
Patient adamantly refusing any home health of any kind. Dr. Jerelyn Charles aware.

## 2017-08-12 DIAGNOSIS — R109 Unspecified abdominal pain: Secondary | ICD-10-CM

## 2017-08-18 NOTE — Discharge Summary (Signed)
Physician Discharge Summary  Patient ID: Ashley Mack MRN: [id number] DOB/AGE: 05-13-1945 72 y.o.  Admit date: 08/06/2017 Discharge date: 08/11/2017  Admission Diagnoses:  Discharge Diagnoses:  Active Problems:   Abdominal pain, RUQ   Flank pain   Discharged Condition: good  Hospital Course: 72 y.o. female presented to [facility] ED for Right flank to back pain x 4 days following one of several falls. Workup was found to be significant for CT imaging demonstrating a minimally dilated appendix without any surrounding inflammation, and patient reported her pain had completely resolved when evaluated in ED. She also had a minimal leukocytosis. Considering these findings, patient was admitted to surgical service for overnight observation without antibiotics or pain medication, and she denied any further pain until she complained of Right back pain the following morning and continued to pass flatus and BM without N/V with complete resolution of her leukocytosis without antibiotics/intervention. Advancement of patient's diet and ambulation were well-tolerated. The remainder of patient's hospital course was essentially unremarkable, and discharge planning was initiated accordingly with patient safely able to be discharged home with appropriate discharge instructions and outpatient follow-up after all of her and family's questions were answered to their expressed satisfaction.  Consults: None  Significant Diagnostic Studies: radiology: CT scan: mildly dilated appendix without surrounding inflammation, Labs: WBC 12.8 to 8.7 without antibiotics  Treatments: IV hydration  Discharge Exam: Blood pressure (!) 154/67, pulse 79, temperature 97.8 F (36.6 C), temperature source Oral, resp. rate 16, height 5\' 2"  (1.575 m), weight 154 lb 1.6 oz (69.9 kg), SpO2 94 %. General appearance: alert, cooperative and no distress Back: symmetric, no curvature. ROM normal. No CVA tenderness., Right flank tenderness to  palpation GI: soft, non-tender; bowel sounds normal; no masses,  no organomegaly  Disposition:    Allergies as of 08/07/2017      Reactions   Hydrocodone-acetaminophen Nausea And Vomiting   Dizziness   Meloxicam Nausea And Vomiting   Other Nausea Only   Arthritis medications    Oxycodone    Dizzy, foggy feeling   Promethazine Hcl Other (See Comments)   Propoxyphene Nausea Only   Ranitidine Hives   Sulfasalazine Other (See Comments)   Stomach upset   Topiramate Nausea Only      Medication List    TAKE these medications   DEXILANT 60 MG capsule Generic drug:  dexlansoprazole Take 1 capsule by mouth daily.   fluticasone 50 MCG/ACT nasal spray Commonly known as:  FLONASE Place into both nostrils daily as needed for allergies or rhinitis.   INTEGRA 62.5-62.5-40-3 MG Caps Take 1 capsule by mouth daily.   naproxen sodium 220 MG tablet Commonly known as:  ALEVE Take 220 mg by mouth as needed.   ondansetron 4 MG tablet Commonly known as:  ZOFRAN Take 1 tablet (4 mg total) by mouth daily as needed for up to 20 doses (if needed for nausea with narcotic pain medication for back/flank pain).   predniSONE 5 MG tablet Commonly known as:  DELTASONE Take 5 mg by mouth daily.   SYMBICORT 80-4.5 MCG/ACT inhaler Generic drug:  budesonide-formoterol Inhale 2 puffs into the lungs every morning.   traMADol-acetaminophen 37.5-325 MG tablet Commonly known as:  ULTRACET Take 1 tablet by mouth every 6 (six) hours as needed (for back/flank pain).   Vitamin D3 2000 units capsule Take 1 capsule by mouth daily.      Follow-up Information    Kirk Ruths, MD. Schedule an appointment as soon as possible for a visit.  Specialty:  Internal Medicine Contact information: [place] [address] [address] [place] Follow up.   Specialty:  General Surgery Why:  You do not need to schedule a follow-up appointment with  general surgery, but don't hesitate to call if you have any general surgical questions or concerns. Contact information: [address] [place] [phone]          Signed: Vickie Epley 08/18/2017, 7:34 AM

## 2017-08-19 ENCOUNTER — Ambulatory Visit: Payer: Medicare Other

## 2017-08-25 ENCOUNTER — Inpatient Hospital Stay: Payer: Medicare Other | Attending: Hematology and Oncology

## 2017-08-25 DIAGNOSIS — E538 Deficiency of other specified B group vitamins: Secondary | ICD-10-CM | POA: Diagnosis not present

## 2017-08-25 DIAGNOSIS — D509 Iron deficiency anemia, unspecified: Secondary | ICD-10-CM | POA: Insufficient documentation

## 2017-08-25 LAB — CBC WITH DIFFERENTIAL/PLATELET
BASOS ABS: 0.1 10*3/uL (ref 0–0.1)
Basophils Relative: 0 %
Eosinophils Absolute: 0 10*3/uL (ref 0–0.7)
Eosinophils Relative: 0 %
HEMATOCRIT: 40.2 % (ref 35.0–47.0)
Hemoglobin: 13.5 g/dL (ref 12.0–16.0)
LYMPHS PCT: 10 %
Lymphs Abs: 1.3 10*3/uL (ref 1.0–3.6)
MCH: 28.8 pg (ref 26.0–34.0)
MCHC: 33.6 g/dL (ref 32.0–36.0)
MCV: 85.9 fL (ref 80.0–100.0)
MONO ABS: 0.6 10*3/uL (ref 0.2–0.9)
Monocytes Relative: 5 %
NEUTROS ABS: 10.6 10*3/uL — AB (ref 1.4–6.5)
Neutrophils Relative %: 85 %
Platelets: 502 10*3/uL — ABNORMAL HIGH (ref 150–440)
RBC: 4.68 MIL/uL (ref 3.80–5.20)
RDW: 16 % — ABNORMAL HIGH (ref 11.5–14.5)
WBC: 12.5 10*3/uL — AB (ref 3.6–11.0)

## 2017-08-25 LAB — FERRITIN: Ferritin: 56 ng/mL (ref 11–307)

## 2017-08-26 ENCOUNTER — Other Ambulatory Visit: Payer: Self-pay | Admitting: Urgent Care

## 2017-08-26 ENCOUNTER — Ambulatory Visit: Payer: Medicare Other

## 2017-08-26 ENCOUNTER — Inpatient Hospital Stay (HOSPITAL_BASED_OUTPATIENT_CLINIC_OR_DEPARTMENT_OTHER): Payer: Medicare Other | Admitting: Hematology and Oncology

## 2017-08-26 ENCOUNTER — Inpatient Hospital Stay: Payer: Medicare Other

## 2017-08-26 ENCOUNTER — Encounter: Payer: Self-pay | Admitting: Hematology and Oncology

## 2017-08-26 VITALS — BP 142/81 | HR 97 | Temp 98.0°F | Resp 20 | Wt 145.2 lb

## 2017-08-26 DIAGNOSIS — E538 Deficiency of other specified B group vitamins: Secondary | ICD-10-CM

## 2017-08-26 DIAGNOSIS — D509 Iron deficiency anemia, unspecified: Secondary | ICD-10-CM

## 2017-08-26 DIAGNOSIS — M069 Rheumatoid arthritis, unspecified: Secondary | ICD-10-CM

## 2017-08-26 DIAGNOSIS — M818 Other osteoporosis without current pathological fracture: Secondary | ICD-10-CM | POA: Diagnosis not present

## 2017-08-26 MED ORDER — CYANOCOBALAMIN 1000 MCG/ML IJ SOLN
1000.0000 ug | Freq: Once | INTRAMUSCULAR | Status: AC
  Administered 2017-08-26: 1000 ug via INTRAMUSCULAR
  Filled 2017-08-26: qty 1

## 2017-08-26 NOTE — Progress Notes (Signed)
[facility] day:  08/26/2017  Chief Complaint: Ashley Mack is a 72 y.o. female with iron deficiency anemia and B12 deficiency who is seen for 3 month assessment and continuation of B12.  HPI:  The patient was last seen in the hematology clinic on 05/27/2017 by Honor Loh, NP.   At that time, she remained fatigued.  She noted shortness of breath on exertion.  Exam was stable.  Hemoglobin was 11.7, hematocrit 35.4, MCV 86.4, MCH 28.6, and platelets 457,000. WBC was chronically elevated due to corticosteroid use; 12.9 today. Ferritin was low at 15.   She received weekly Venofer x 3 (05/27/2017 - 06/10/2017).  She received B12 on 06/24/2017 and 07/22/2017.  She was admitted to [facility] from 08/08/2017 - 08/11/2017 with low back pain.  She had a compression fracture of L2.  She underwent kyphoplasty on 08/10/2017 by Dr. Rudene Christians. She is scheduled to see orthopedics in follow up this afternoon (08/26/2017).   CBC on 08/25/2017 revealed a hematocrit of 40.2, hemoglobin 13.5, and MCV 85.9.  Ferritin was 56.  During the interim, patient is having post-operative pain and nausea in her back following kyphoplasty.  She states that she is feeling pretty good if "it wasn't for my back".   Past Medical History:  Diagnosis Date  . Anemia   . Arthritis    Rheumatoid  . [place] of gallbladder   . Centrilobular emphysema ([place])   . Dyspnea   . Fatty liver   . GERD (gastroesophageal reflux disease)   . History of fractured vertebra    sees chiropractor  . 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 Iron (Fe) deficiency anemia   . Migraine headache    in past  . Migraines   . Motion sickness   . 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 Osteoporosis   . Rheumatoid arthritis ([place])   . Vertigo     Past Surgical History:  Procedure Laterality Date  . CATARACT EXTRACTION W/PHACO Right 08/12/2016   Procedure: CATARACT EXTRACTION PHACO AND INTRAOCULAR LENS PLACEMENT ([place])   Right;  Surgeon: Leandrew Koyanagi, MD;  Location: [place];  Service: Ophthalmology;  Laterality: Right;  . CATARACT EXTRACTION W/PHACO Left 09/30/2016   Procedure: CATARACT EXTRACTION PHACO AND INTRAOCULAR LENS PLACEMENT ([place]) Left;  Surgeon: Leandrew Koyanagi, MD;  Location: [place];  Service: Ophthalmology;  Laterality: Left;  . COLONOSCOPY    . COLONOSCOPY WITH PROPOFOL N/A 12/09/2016   Procedure: COLONOSCOPY WITH PROPOFOL;  Surgeon: Toledo, Benay Pike, MD;  Location: [facility] END[facility];  Service: Endoscopy;  Laterality: N/A;  . ESOPHAGOGASTRODUODENOSCOPY    . ESOPHAGOGASTRODUODENOSCOPY (EGD) WITH PROPOFOL N/A 12/09/2016   Procedure: ESOPHAGOGASTRODUODENOSCOPY (EGD) WITH PROPOFOL;  Surgeon: Toledo, Benay Pike, MD;  Location: [facility] ENDOSCOPY;  Service: Endoscopy;  Laterality: N/A;  . KYPHOPLASTY N/A 08/10/2017   Procedure: Hewitt Shorts;  Surgeon: Hessie Knows, MD;  Location: [facility];  Service: Orthopedics;  Laterality: N/A;  . OOPHORECTOMY Bilateral 2003    Family History  Problem Relation Age of Onset  . Kidney disease Mother   . Diabetes Mellitus II Brother     Social History:  reports that she quit smoking about 10 years ago. She has never used smokeless tobacco. She reports that she does not drink alcohol or use drugs.  She quit smoking in 2009.  She lives in [place].  The patient is accompanied by [employer], her step daughter, today.  Allergies:  Allergies  Allergen Reactions  .  Hydrocodone-Acetaminophen Nausea And Vomiting    Dizziness  . Meloxicam Nausea And Vomiting  . Other Nausea Only    Arthritis medications   . Oxycodone     Dizzy, foggy feeling  . Promethazine Hcl Other (See Comments)  . Propoxyphene Nausea Only  . Ranitidine Hives  . Sulfasalazine Other (See Comments)    Stomach upset  . Topiramate Nausea Only    Current Medications: Current Outpatient Medications  Medication Sig Dispense Refill  . budesonide-formoterol (SYMBICORT)  80-4.5 MCG/ACT inhaler Inhale 2 puffs into the lungs every morning.     . Cholecalciferol (VITAMIN D3) 2000 UNITS capsule Take 1 capsule by mouth daily.    Marland Kitchen DEXILANT 60 MG capsule Take 1 capsule by mouth daily.    . Fe Fum-FePoly-Vit C-Vit B3 (INTEGRA) 62.5-62.5-40-3 MG CAPS Take 1 capsule by mouth daily.    . fluticasone (FLONASE) 50 MCG/ACT nasal spray Place into both nostrils daily as needed for allergies or rhinitis.    . naproxen sodium (ANAPROX) 220 MG tablet Take 220 mg by mouth as needed.    . predniSONE (DELTASONE) 5 MG tablet Take 5 mg by mouth daily.     . cyclobenzaprine (FLEXERIL) 5 MG tablet Take 5 mg by mouth 3 (three) times daily as needed.    . docusate sodium (COLACE) 100 MG capsule Take 1 capsule (100 mg total) by mouth daily as needed. (Patient not taking: Reported on 08/26/2017) 30 capsule 0  . ondansetron (ZOFRAN) 4 MG tablet Take 1 tablet (4 mg total) by mouth daily as needed for up to 20 doses (if needed for nausea with narcotic pain medication for back/flank pain). (Patient not taking: Reported on 08/26/2017) 20 tablet 1   No current facility-administered medications for this visit.     Review of Systems:  GENERAL:  Fatigue.  No fevers, sweats.  Weight down 12 pounds. PERFORMANCE STATUS (ECOG):  1 HEENT:  Poor vision.  No visual changes, runny nose, sore throat, mouth sores or tenderness. Lungs: Shortness of breath with exertion.  No cough.  No hemoptysis. Cardiac:  No chest pain, palpitations, orthopnea, or PND. GI:  Nausea.  No vomiting, diarrhea, constipation, melena or hematochezia. GU:  No urgency, frequency, dysuria, or hematuria. Musculoskeletal:  Chronic back pain s/p kyphoplasty.  No joint pain.  No muscle tenderness. Extremities:  No pain or swelling. Skin:  No rashes or skin changes. Neuro:  No headache, numbness or weakness, balance or coordination issues. Endocrine:  No diabetes, thyroid issues, hot flashes or night sweats. Psych:  No mood changes,  depression or anxiety. Pain:  No focal pain. Review of systems:  All other systems reviewed and found to be negative.   Physical Exam: Blood pressure (!) 142/81, pulse 97, temperature 98 F (36.7 C), temperature source Tympanic, resp. rate 20, weight 145 lb 4 oz (65.9 kg), SpO2 96 %. GENERAL:  Well developed, well nourished, woman sitting comfortably in a wheelchair in the exam room in no acute distress. MENTAL STATUS:  Alert and oriented to person, place and time. HEAD:  Short brown hair.  Normocephalic, atraumatic, face symmetric, no Cushingoid features. EYES:  Glasses.  Blue eyes.  Pupils equal round and reactive to light and accomodation.  No conjunctivitis or scleral icterus. ENT:  Oropharynx clear without lesion.  Tongue normal. Mucous membranes moist.  RESPIRATORY:  Clear to auscultation without rales, wheezes or rhonchi. CARDIOVASCULAR:  Regular rate and rhythm without murmur, rub or gallop. ABDOMEN:  Soft, non-tender, with active bowel sounds, and no  hepatosplenomegaly.  No masses. SKIN:  No rashes, ulcers or lesions. EXTREMITIES: No edema, no skin discoloration or tenderness.  No palpable cords. LYMPH NODES: No palpable cervical, supraclavicular, axillary or inguinal adenopathy  NEUROLOGICAL: Unremarkable. PSYCH:  Appropriate.    Appointment on 08/25/2017  Component Date Value Ref Range Status  . WBC 08/25/2017 12.5* 3.6 - 11.0 K/uL Final  . RBC 08/25/2017 4.68  3.80 - 5.20 MIL/uL Final  . Hemoglobin 08/25/2017 13.5  12.0 - 16.0 g/dL Final  . HCT 08/25/2017 40.2  35.0 - 47.0 % Final  . MCV 08/25/2017 85.9  80.0 - 100.0 fL Final  . MCH 08/25/2017 28.8  26.0 - 34.0 pg Final  . MCHC 08/25/2017 33.6  32.0 - 36.0 g/dL Final  . RDW 08/25/2017 16.0* 11.5 - 14.5 % Final  . Platelets 08/25/2017 502* 150 - 440 K/uL Final  . Neutrophils Relative % 08/25/2017 85  % Final  . Neutro Abs 08/25/2017 10.6* 1.4 - 6.5 K/uL Final  . Lymphocytes Relative 08/25/2017 10  % Final  . Lymphs Abs  08/25/2017 1.3  1.0 - 3.6 K/uL Final  . Monocytes Relative 08/25/2017 5  % Final  . Monocytes Absolute 08/25/2017 0.6  0.2 - 0.9 K/uL Final  . Eosinophils Relative 08/25/2017 0  % Final  . Eosinophils Absolute 08/25/2017 0.0  0 - 0.7 K/uL Final  . Basophils Relative 08/25/2017 0  % Final  . Basophils Absolute 08/25/2017 0.1  0 - 0.1 K/uL Final   Performed at [facility], [address], [place], [place] [address]  . Ferritin 08/25/2017 56  11 - 307 ng/mL Final   Performed at [facility], [address] [place], [place] [address]    Assessment:  Ashley Mack is a 72 y.o. female with iron deficiency anemia and B12 deficiency.  Diet is good.  She was on oral iron (Integra) for years.  She is unsure of any melena or hematochezia as she can not see well.    Work-up on 08/31/2016 revealed a hematocrit 30.6, hemoglobin 9.9, and MCV 83.2.  B12 was 90.  Ferritin was 13.  Sed rate was 45 (elevated).  Retic was 2.6%.  Normal studies included the following:  Coombs, folate, iron saturation, TIBC, SPEP, and free light chain ratio.  Normal studies on 07/29/2016 included: creatinine, calcium, albumin, protein, liver function tests, and TSH.  She had a colonoscopy on 03/18/2009. She has had several EGDs with the last on 10/12/2008.  She states that she has had polyps, reflux and a hiatal hernia. EGD on 12/09/2016 was normal, other than demonstrating a small hiatal hernia. Colonoscopy on 12/09/2016 demonstrated friability with contact bleeding in the proximal transverse colon. A 1 mm sessile polyp was found in the sigmoid colon, in addition to grade II non-bleeding internal hemorrhoids were also noted. Pathology results demonstrated a tubular adenoma that was negative for dyplasia or malignancy. 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   She began weekly B12 on 09/08/2016 and continued x 6 weeks (last 10/16/2016). She began monthly B12 on 11/26/2016 (last 07/22/2017).    She received  Venofer on 09/08/2016, 09/15/2016, 09/25/2016, 11/26/2016, 12/16/2016, 12/24/2016, and x3 (05/27/2017 - 06/10/2017).  Ferritin has been followed: 16 on 11/26/2016, 34 on 03/01/2017, 15 on 05/26/2017, 59 on 07/08/2017, and 56 on 08/25/2017.  She has rheumatoid arthritis.  She has been treated with methotrexate, Imuran, leflunomide, and sulfasalazine.  She is on prednisone 5 mg a day x 2 years.  Symptomatically, she remains fatigued.  Exam is stable.  Hemoglobin 13.5, hematocrit 42, MCV 85.9, and platelets 502,000.  WBC chronically elevated due to corticosteroid use; 12,500 today. Ferritin 56.   Plan: 1.  Review labs from 08/26/2017.  Hemoglobin and RBC indices are normal.  Ferritin is adequate. 2.  Continue monthly B12 x 6 (due today). 3.  RTC in 3 months for labs (CBC with differential, ferritin) and B12. 5.  RTC in 6 months for provider assessment, labs (CBC with diff, ferritin - the day before), B12, and +/- Venofer.   Honor Loh, NP  08/26/2017, 11:23 AM   I saw and evaluated the patient, participating in the key portions of the service and reviewing pertinent diagnostic studies and records.  I reviewed the nurse practitioner's note and agree with the findings and the plan.  The assessment and plan were discussed with the patient. Several questions were asked by the patient and answered.   Nolon Stalls, MD 08/26/2017,11:23 AM

## 2017-08-26 NOTE — Progress Notes (Signed)
Pt in for follow up, reports having back surgery 2 weeks ago in severe pain, seeing surgeon this afternoon. Pt reports still "being fatigued".

## 2017-08-27 ENCOUNTER — Other Ambulatory Visit: Payer: Medicare Other

## 2017-08-30 ENCOUNTER — Other Ambulatory Visit: Payer: Self-pay | Admitting: Orthopedic Surgery

## 2017-08-30 DIAGNOSIS — S32040A Wedge compression fracture of fourth lumbar vertebra, initial encounter for closed fracture: Secondary | ICD-10-CM

## 2017-09-06 ENCOUNTER — Ambulatory Visit
Admission: RE | Admit: 2017-09-06 | Discharge: 2017-09-06 | Disposition: A | Discharge status: Home / Self Care | Payer: Medicare Other | Source: Ambulatory Visit | Attending: Orthopedic Surgery | Admitting: Orthopedic Surgery

## 2017-09-06 DIAGNOSIS — S32040A Wedge compression fracture of fourth lumbar vertebra, initial encounter for closed fracture: Secondary | ICD-10-CM | POA: Insufficient documentation

## 2017-09-06 DIAGNOSIS — Z9889 Other specified postprocedural states: Secondary | ICD-10-CM | POA: Insufficient documentation

## 2017-09-06 DIAGNOSIS — X58XXXA Exposure to other specified factors, initial encounter: Secondary | ICD-10-CM | POA: Diagnosis not present

## 2017-09-06 DIAGNOSIS — M47816 Spondylosis without myelopathy or radiculopathy, lumbar region: Secondary | ICD-10-CM | POA: Diagnosis not present

## 2017-09-09 ENCOUNTER — Other Ambulatory Visit: Payer: Self-pay

## 2017-09-09 ENCOUNTER — Encounter: Payer: Self-pay | Admitting: *Deleted

## 2017-09-09 ENCOUNTER — Ambulatory Visit
Admission: RE | Admit: 2017-09-09 | Discharge: 2017-09-09 | Disposition: A | Discharge status: Home / Self Care | Payer: Medicare Other | Source: Ambulatory Visit | Attending: Orthopedic Surgery | Admitting: Orthopedic Surgery

## 2017-09-09 ENCOUNTER — Encounter
Admission: RE | Disposition: A | Discharge status: Home / Self Care | Payer: Self-pay | Source: Ambulatory Visit | Attending: Orthopedic Surgery

## 2017-09-09 ENCOUNTER — Ambulatory Visit: Payer: Medicare Other

## 2017-09-09 DIAGNOSIS — M4856XA Collapsed vertebra, not elsewhere classified, lumbar region, initial encounter for fracture: Secondary | ICD-10-CM | POA: Insufficient documentation

## 2017-09-09 DIAGNOSIS — Z87891 Personal history of nicotine dependence: Secondary | ICD-10-CM | POA: Diagnosis not present

## 2017-09-09 DIAGNOSIS — J449 Chronic obstructive pulmonary disease, unspecified: Secondary | ICD-10-CM | POA: Diagnosis not present

## 2017-09-09 DIAGNOSIS — Z79899 Other long term (current) drug therapy: Secondary | ICD-10-CM | POA: Diagnosis not present

## 2017-09-09 DIAGNOSIS — K219 Gastro-esophageal reflux disease without esophagitis: Secondary | ICD-10-CM | POA: Diagnosis not present

## 2017-09-09 DIAGNOSIS — Z419 Encounter for procedure for purposes other than remedying health state, unspecified: Secondary | ICD-10-CM

## 2017-09-09 HISTORY — DX: Other specified disorders involving the immune mechanism, not elsewhere classified: D89.89

## 2017-09-09 HISTORY — DX: Unspecified background retinopathy: H35.00

## 2017-09-09 HISTORY — PX: KYPHOPLASTY: SHX5884

## 2017-09-09 SURGERY — KYPHOPLASTY
Anesthesia: General | Site: Back | Wound class: "Clean "

## 2017-09-09 MED ORDER — IOPAMIDOL (ISOVUE-M 200) INJECTION 41%
INTRAMUSCULAR | Status: AC
  Filled 2017-09-09: qty 20

## 2017-09-09 MED ORDER — FENTANYL CITRATE (PF) 100 MCG/2ML IJ SOLN
25.0000 ug | INTRAMUSCULAR | Status: DC | PRN
  Administered 2017-09-09 (×2): 25 ug via INTRAVENOUS

## 2017-09-09 MED ORDER — FENTANYL CITRATE (PF) 100 MCG/2ML IJ SOLN
INTRAMUSCULAR | Status: AC
  Administered 2017-09-09: 25 ug via INTRAVENOUS
  Filled 2017-09-09: qty 2

## 2017-09-09 MED ORDER — CEFAZOLIN SODIUM-DEXTROSE 2-4 GM/100ML-% IV SOLN
2.0000 g | Freq: Once | INTRAVENOUS | Status: AC
  Administered 2017-09-09: 2 g via INTRAVENOUS

## 2017-09-09 MED ORDER — LACTATED RINGERS IV SOLN
INTRAVENOUS | Status: DC
  Administered 2017-09-09: 50 mL/h via INTRAVENOUS

## 2017-09-09 MED ORDER — FENTANYL CITRATE (PF) 100 MCG/2ML IJ SOLN
INTRAMUSCULAR | Status: DC | PRN
  Administered 2017-09-09 (×2): 12.5 ug via INTRAVENOUS
  Administered 2017-09-09: 25 ug via INTRAVENOUS
  Administered 2017-09-09 (×2): 12.5 ug via INTRAVENOUS

## 2017-09-09 MED ORDER — BUPIVACAINE-EPINEPHRINE (PF) 0.5% -1:200000 IJ SOLN
INTRAMUSCULAR | Status: DC | PRN
  Administered 2017-09-09: 25 mL

## 2017-09-09 MED ORDER — PROPOFOL 10 MG/ML IV BOLUS
INTRAVENOUS | Status: AC
  Filled 2017-09-09: qty 20

## 2017-09-09 MED ORDER — KETAMINE HCL 50 MG/ML IJ SOLN
INTRAMUSCULAR | Status: AC
  Filled 2017-09-09: qty 10

## 2017-09-09 MED ORDER — FENTANYL CITRATE (PF) 100 MCG/2ML IJ SOLN
INTRAMUSCULAR | Status: AC
  Filled 2017-09-09: qty 2

## 2017-09-09 MED ORDER — GLYCOPYRROLATE 0.2 MG/ML IJ SOLN
INTRAMUSCULAR | Status: DC | PRN
  Administered 2017-09-09: 0.1 mg via INTRAVENOUS

## 2017-09-09 MED ORDER — LIDOCAINE HCL 1 % IJ SOLN
INTRAMUSCULAR | Status: DC | PRN
  Administered 2017-09-09: 35 mL

## 2017-09-09 MED ORDER — LIDOCAINE HCL (PF) 1 % IJ SOLN
INTRAMUSCULAR | Status: AC
  Filled 2017-09-09: qty 30

## 2017-09-09 MED ORDER — CEFAZOLIN SODIUM-DEXTROSE 2-4 GM/100ML-% IV SOLN
INTRAVENOUS | Status: AC
  Filled 2017-09-09: qty 100

## 2017-09-09 MED ORDER — LIDOCAINE HCL (PF) 2 % IJ SOLN
INTRAMUSCULAR | Status: AC
  Filled 2017-09-09: qty 10

## 2017-09-09 MED ORDER — IOPAMIDOL (ISOVUE-M 200) INJECTION 41%
INTRAMUSCULAR | Status: DC | PRN
  Administered 2017-09-09: 20 mL

## 2017-09-09 MED ORDER — KETAMINE HCL 10 MG/ML IJ SOLN
INTRAMUSCULAR | Status: DC | PRN
  Administered 2017-09-09: 30 mg via INTRAVENOUS

## 2017-09-09 MED ORDER — ONDANSETRON HCL 4 MG/2ML IJ SOLN
4.0000 mg | Freq: Once | INTRAMUSCULAR | Status: AC | PRN
  Administered 2017-09-09: 4 mg via INTRAVENOUS

## 2017-09-09 MED ORDER — HYDROCODONE-ACETAMINOPHEN 5-325 MG PO TABS: 1.0000 | ORAL_TABLET | Freq: Four times a day (QID) | ORAL | 0 refills | Status: DC | PRN

## 2017-09-09 MED ORDER — PHENYLEPHRINE HCL 10 MG/ML IJ SOLN
INTRAMUSCULAR | Status: DC | PRN
  Administered 2017-09-09: 100 ug via INTRAVENOUS

## 2017-09-09 MED ORDER — MIDAZOLAM HCL 2 MG/2ML IJ SOLN
INTRAMUSCULAR | Status: AC
  Filled 2017-09-09: qty 2

## 2017-09-09 MED ORDER — PROPOFOL 10 MG/ML IV BOLUS
INTRAVENOUS | Status: DC | PRN
  Administered 2017-09-09: 20 mg via INTRAVENOUS
  Administered 2017-09-09: 30 mg via INTRAVENOUS
  Administered 2017-09-09: 20 mg via INTRAVENOUS
  Administered 2017-09-09: 30 mg via INTRAVENOUS

## 2017-09-09 MED ORDER — MIDAZOLAM HCL 2 MG/2ML IJ SOLN
INTRAMUSCULAR | Status: DC | PRN
  Administered 2017-09-09: 2 mg via INTRAVENOUS

## 2017-09-09 MED ORDER — BUPIVACAINE-EPINEPHRINE (PF) 0.5% -1:200000 IJ SOLN
INTRAMUSCULAR | Status: AC
  Filled 2017-09-09: qty 30

## 2017-09-09 MED ORDER — PROPOFOL 500 MG/50ML IV EMUL
INTRAVENOUS | Status: DC | PRN
  Administered 2017-09-09: 35 ug/kg/min via INTRAVENOUS

## 2017-09-09 SURGICAL SUPPLY — 16 items
CEMENT KYPHON CX01A KIT/MIXER (Cement) ×3 IMPLANT
DERMABOND ADVANCED (GAUZE/BANDAGES/DRESSINGS) ×2
DERMABOND ADVANCED .7 DNX12 (GAUZE/BANDAGES/DRESSINGS) ×1 IMPLANT
DEVICE BIOPSY BONE KYPHX (INSTRUMENTS) ×3 IMPLANT
DRAPE C-ARM XRAY 36X54 (DRAPES) ×3 IMPLANT
DURAPREP 26ML APPLICATOR (WOUND CARE) ×3 IMPLANT
GLOVE SURG SYN 9.0  PF PI (GLOVE) ×2
GLOVE SURG SYN 9.0 PF PI (GLOVE) ×1 IMPLANT
GOWN SRG 2XL LVL 4 RGLN SLV (GOWNS) ×1 IMPLANT
GOWN STRL NON-REIN 2XL LVL4 (GOWNS) ×2
GOWN STRL REUS W/ TWL LRG LVL3 (GOWN DISPOSABLE) ×1 IMPLANT
GOWN STRL REUS W/TWL LRG LVL3 (GOWN DISPOSABLE) ×2
PACK KYPHOPLASTY (MISCELLANEOUS) ×3 IMPLANT
STRAP SAFETY 5IN WIDE (MISCELLANEOUS) ×3 IMPLANT
TRAY KYPHOPAK 15/3 EXPRESS 1ST (MISCELLANEOUS) ×3 IMPLANT
TRAY KYPHOPAK 20/3 EXPRESS 1ST (MISCELLANEOUS) ×1 IMPLANT

## 2017-09-09 NOTE — Op Note (Signed)
09/09/2017  2:22 PM  PATIENT:  Ashley Mack  72 y.o. female  PRE-OPERATIVE DIAGNOSIS:  CLOSED WEDGE COMPRESSION FRACTURE, L1 and L4  POST-OPERATIVE DIAGNOSIS:  CLOSED WEDGE COMPRESSION FRACTURE, same  PROCEDURE:  Procedure(s): KYPHOPLASTY-L1,L4 (N/A)  SURGEON: Laurene Footman, MD  ASSISTANTS: None  ANESTHESIA:   local and MAC  EBL:  Total I/O In: 1000 [I.V.:1000] Out: 5 [Blood:5]  BLOOD ADMINISTERED:none  DRAINS: none   LOCAL MEDICATIONS USED:  MARCAINE    and XYLOCAINE   SPECIMEN:  Source of Specimen:  L4 vertebral body  DISPOSITION OF SPECIMEN:  PATHOLOGY  COUNTS:  YES  TOURNIQUET:  * No tourniquets in log *  IMPLANTS: Bone cement  DICTATION: .Dragon Dictation patient was brought to the operating room and after adequate sedation was given she was placed prone.  Serum was brought into good visualization of both L1 and L4 was obtained with prior L2 kyphoplasty.  After appropriate patient identification and timeout procedures were completed, 5 cc 1% Xylocaine was infiltrated on the right side of both L1 and L4.  The back was then prepped and draped in the usual sterile manner and repeat timeout procedure carried out.  Local anesthetic was infiltrated down to the pedicle on the right side of both of these levels.  A 50-50 mix of 1% Xylocaine half percent Sensorcaine with epinephrine was used.  After allowing this to set a small incision was made first at L1 trocar advanced in an extrapedicular fashion into the body.  At L1 no biopsy was obtained at L4 1 months.  Drilling was carried out followed by inflation of the balloon with 4 cc of L1 and 3 cc at L4.  Cement was mixed and was the appropriate consistency approximately 4-1/2 cc was used to fill L1 and 5-1/2 cc L4.  The cemented set the appropriate consistency trochars were removed without extravasation.  The wounds are closed with Dermabond and Band-Aids applied  PLAN OF CARE: Discharge to home after PACU  PATIENT  DISPOSITION:  PACU - hemodynamically stable.

## 2017-09-09 NOTE — Anesthesia Post-op Follow-up Note (Signed)
Anesthesia QCDR form completed.        

## 2017-09-09 NOTE — Transfer of Care (Signed)
Immediate Anesthesia Transfer of Care Note  Patient: Ashley Mack  Procedure(s) Performed: Herminio Commons (N/A Back)  Patient Location: PACU  Anesthesia Type:General  Level of Consciousness: awake, alert  and oriented  Airway & Oxygen Therapy: Patient Spontanous Breathing and Patient connected to face mask oxygen  Post-op Assessment: Report given to RN and Post -op Vital signs reviewed and stable  Post vital signs: Reviewed and stable  Last Vitals:  Vitals Value Taken Time  BP 159/72 09/09/2017  2:18 PM  Temp 36.2 C 09/09/2017  2:18 PM  Pulse 89 09/09/2017  2:20 PM  Resp 19 09/09/2017  2:21 PM  SpO2 100 % 09/09/2017  2:20 PM  Vitals shown include unvalidated device data.  Last Pain:  Vitals:   09/09/17 1102  TempSrc: Oral  PainSc: 10-Worst pain ever      Patients Stated Pain Goal: 1 (03/01/23 4695)  Complications: No apparent anesthesia complications

## 2017-09-09 NOTE — H&P (Signed)
Reviewed paper H+P, will be scanned into chart. No changes noted.  

## 2017-09-09 NOTE — Anesthesia Preprocedure Evaluation (Signed)
Anesthesia Evaluation  Patient identified by MRN, date of birth, ID band Patient awake    Reviewed: Allergy & Precautions, NPO status , Patient's Chart, lab work & pertinent test results  History of Anesthesia Complications Negative for: history of anesthetic complications  Airway Mallampati: III       Dental  (+) Edentulous Upper, Edentulous Lower   Pulmonary neg sleep apnea, COPD,  COPD inhaler, former smoker,           Cardiovascular      Neuro/Psych neg Seizures    GI/Hepatic Neg liver ROS, GERD  Medicated and Controlled,  Endo/Other  neg diabetes  Renal/GU negative Renal ROS     Musculoskeletal   Abdominal   Peds  Hematology  (+) anemia ,   Anesthesia Other Findings   Reproductive/Obstetrics                             Anesthesia Physical Anesthesia Plan  ASA: III  Anesthesia Plan: General   Post-op Pain Management:    Induction:   PONV Risk Score and Plan: 3 and Dexamethasone, Ondansetron and Midazolam  Airway Management Planned: Nasal Cannula  Additional Equipment:   Intra-op Plan:   Post-operative Plan:   Informed Consent: I have reviewed the patients History and Physical, chart, labs and discussed the procedure including the risks, benefits and alternatives for the proposed anesthesia with the patient or authorized representative who has indicated his/her understanding and acceptance.     Plan Discussed with:   Anesthesia Plan Comments:         Anesthesia Quick Evaluation

## 2017-09-09 NOTE — H&P (Signed)
Subjective:   Patient is a 72 y.o. female presents with back pain. Onset of symptoms was gradual starting a few weeks ago with gradually worsening course since that time. The pain is located in low and mid back. Patient describes the pain as intense continuous and rated as severe. . Symptoms are aggravated by standing. Symptoms improve with lying flat. Past history includes prior kyphoplasty.  Previous studies include MRI confirming L1 and L4 fractures.  Patient Active Problem List   Diagnosis Date Noted  . Flank pain   . Intractable back pain 08/08/2017  . Compression fracture of lumbar vertebra ([place]) 08/08/2017  . Abdominal pain, RUQ 08/06/2017  . Fatigue 05/29/2017  . Exertional dyspnea 05/29/2017  . Guaiac positive stools 11/26/2016  . Iron deficiency anemia 09/01/2016  . B12 deficiency 08/31/2016  . Normocytic anemia 08/30/2016   Past Medical History:  Diagnosis Date  . Anemia   . Arthritis    Rheumatoid  . [place] of gallbladder   . 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 Centrilobular emphysema ([place])   . Dyspnea   . Fatty liver   . GERD (gastroesophageal reflux disease)   . History of fractured vertebra    sees chiropractor  . 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 Iron (Fe) deficiency anemia   . Migraine headache    in past  . Migraines   . Motion sickness   . 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 Osteoporosis   . Rheumatoid arthritis ([place])   . Vertigo     Past Surgical History:  Procedure Laterality Date  . CATARACT EXTRACTION W/PHACO Right 08/12/2016   Procedure: CATARACT EXTRACTION PHACO AND INTRAOCULAR LENS PLACEMENT ([place])  Right;  Surgeon: Leandrew Koyanagi, MD;  Location: [facility];  Service: Ophthalmology;  Laterality: Right;  . CATARACT EXTRACTION W/PHACO Left 09/30/2016   Procedure: CATARACT EXTRACTION PHACO AND INTRAOCULAR LENS PLACEMENT ([place]) Left;  Surgeon: Leandrew Koyanagi, MD;  Location: [facility];  Service:  Ophthalmology;  Laterality: Left;  . COLONOSCOPY    . COLONOSCOPY WITH PROPOFOL N/A 12/09/2016   Procedure: COLONOSCOPY WITH PROPOFOL;  Surgeon: Toledo, Benay Pike, MD;  Location: [facility];  Service: Endoscopy;  Laterality: N/A;  . ESOPHAGOGASTRODUODENOSCOPY    . ESOPHAGOGASTRODUODENOSCOPY (EGD) WITH PROPOFOL N/A 12/09/2016   Procedure: ESOPHAGOGASTRODUODENOSCOPY (EGD) WITH PROPOFOL;  Surgeon: Toledo, Benay Pike, MD;  Location: [facility];  Service: Endoscopy;  Laterality: N/A;  . KYPHOPLASTY N/A 08/10/2017   Procedure: Hewitt Shorts;  Surgeon: Hessie Knows, MD;  Location: [facility];  Service: Orthopedics;  Laterality: N/A;  . OOPHORECTOMY Bilateral 2003    Medications Prior to Admission  Medication Sig Dispense Refill Last Dose  . budesonide-formoterol (SYMBICORT) 80-4.5 MCG/ACT inhaler Inhale 2 puffs into the lungs every morning.    09/08/2017 at 0800  . Cholecalciferol (VITAMIN D3) 2000 UNITS capsule Take 2,000 Units by mouth daily.    09/08/2017 at 0800  . DEXILANT 60 MG capsule Take 60 mg by mouth daily.    09/08/2017 at 0800  . Fe Fum-FePoly-Vit C-Vit B3 (INTEGRA) 62.5-62.5-40-3 MG CAPS Take 1 capsule by mouth daily.   09/08/2017 at 0800  . fluticasone (FLONASE) 50 MCG/ACT nasal spray Place 1-2 sprays into both nostrils daily as needed for allergies or rhinitis.    05/14/2017  . naproxen sodium (ANAPROX) 220 MG tablet Take 220 mg by mouth daily as needed (for pain or headache).    06/14/2017  . predniSONE (DELTASONE) 5 MG tablet Take  5 mg by mouth daily.    09/08/2017 at 0800  . docusate sodium (COLACE) 100 MG capsule Take 1 capsule (100 mg total) by mouth daily as needed. (Patient not taking: Reported on 08/26/2017) 30 capsule 0 Not Taking at Unknown time  . ondansetron (ZOFRAN) 4 MG tablet Take 1 tablet (4 mg total) by mouth daily as needed for up to 20 doses (if needed for nausea with narcotic pain medication for back/flank pain). (Patient not taking: Reported on 08/26/2017) 20 tablet 1  Completed Course at Unknown time   Allergies  Allergen Reactions  . Methotrexate Derivatives Nausea And Vomiting and Other (See Comments)    Flu like symptoms  . Other Nausea Only and Other (See Comments)    Arthritis medications . Unsure of what the med was.  . Ranitidine Hives  . Sulfasalazine Other (See Comments)    Stomach upset. Unsure of reaction. Long time ago  . Topiramate Nausea Only    Social History   Tobacco Use  . Smoking status: Former Smoker    Packs/day: 1.00    Types: Cigarettes    Last attempt to quit: 2009    Years since quitting: 10.4  . Smokeless tobacco: Never Used  Substance Use Topics  . Alcohol use: No    Family History  Problem Relation Age of Onset  . Kidney disease Mother   . Diabetes Mellitus II Brother     Review of Systems Pertinent items are noted in HPI.  Objective:   Patient Vitals for the past 8 hrs:  BP Temp Temp src Pulse Resp SpO2 Height Weight  09/09/17 1102 (!) 141/95 97.8 F (36.6 C) Oral (!) 122 18 96 % 5\' 2"  (1.575 m) 65.8 kg (145 lb)   No intake/output data recorded. No intake/output data recorded.    BP (!) 141/95   Pulse (!) 122   Temp 97.8 F (36.6 C) (Oral)   Resp 18   Ht 5\' 2"  (1.575 m)   Wt 65.8 kg (145 lb)   SpO2 96%   BMI 26.52 kg/m  General appearance: alert, cooperative and appears stated age Eyes: blind Back: tender to percussion, lumbar spine Lungs: clear to auscultation bilaterally Heart: regular rate and rhythm, S1, S2 normal, no murmur, click, rub or gallop Extremities: extremities normal, atraumatic, no cyanosis or edema .  Data ReviewRadiology review: MRI shows L1 and L4 compression fractures  Assessment:   Active Problems:   * No active hospital problems. * lumbar compression fractures, L1 and L4  Plan:   L1 and L4 kyphoplasty

## 2017-09-09 NOTE — Anesthesia Postprocedure Evaluation (Signed)
Anesthesia Post Note  Patient: Ashley Mack  Procedure(s) Performed: Ashley Mack (N/A Back)  Patient location during evaluation: PACU Anesthesia Type: General Level of consciousness: awake and alert Pain management: pain level controlled Vital Signs Assessment: post-procedure vital signs reviewed and stable Respiratory status: spontaneous breathing and respiratory function stable Cardiovascular status: stable Anesthetic complications: no     Last Vitals:  Vitals:   09/09/17 1423 09/09/17 1435  BP:  133/83  Pulse: (!) 113 (!) 103  Resp: (!) 21 20  Temp:    SpO2: 100% 100%    Last Pain:  Vitals:   09/09/17 1423  TempSrc:   PainSc: 7                  [name]

## 2017-09-09 NOTE — OR Nursing (Signed)
Patient refused iv to be placed in hand. Iv started above wrist.

## 2017-09-09 NOTE — Discharge Instructions (Addendum)
Take it easy today and tomorrow, resume more normal activities on Saturday.  Remove Band-Aids on Saturday then okay to shower.  Pain medicine as directed  AMBULATORY SURGERY  DISCHARGE INSTRUCTIONS   1) The drugs that you were given will stay in your system until tomorrow so for the next 24 hours you should not:  A) Drive an automobile B) Make any legal decisions C) Drink any alcoholic beverage   2) You may resume regular meals tomorrow.  Today it is better to start with liquids and gradually work up to solid foods.  You may eat anything you prefer, but it is better to start with liquids, then soup and crackers, and gradually work up to solid foods.   3) Please notify your doctor immediately if you have any unusual bleeding, trouble breathing, redness and pain at the surgery site, drainage, fever, or pain not relieved by medication.    4) Additional Instructions:        Please contact your physician with any problems or Same Day Surgery at [phone], Monday through Friday 6 am to 4 pm, or [place] at [facility] number at [phone].

## 2017-09-10 ENCOUNTER — Encounter: Payer: Self-pay | Admitting: Orthopedic Surgery

## 2017-09-13 LAB — SURGICAL PATHOLOGY

## 2017-09-23 ENCOUNTER — Inpatient Hospital Stay: Payer: Medicare Other | Attending: Hematology and Oncology

## 2017-09-23 DIAGNOSIS — E538 Deficiency of other specified B group vitamins: Secondary | ICD-10-CM | POA: Insufficient documentation

## 2017-09-23 MED ORDER — CYANOCOBALAMIN 1000 MCG/ML IJ SOLN
1000.0000 ug | Freq: Once | INTRAMUSCULAR | Status: AC
  Administered 2017-09-23: 1000 ug via INTRAMUSCULAR

## 2017-09-27 ENCOUNTER — Ambulatory Visit
Admission: RE | Admit: 2017-09-27 | Discharge: 2017-09-27 | Disposition: A | Discharge status: Home / Self Care | Payer: Medicare Other | Source: Ambulatory Visit | Attending: Orthopedic Surgery | Admitting: Orthopedic Surgery

## 2017-09-27 ENCOUNTER — Other Ambulatory Visit: Payer: Self-pay | Admitting: Orthopedic Surgery

## 2017-09-27 DIAGNOSIS — M439 Deforming dorsopathy, unspecified: Secondary | ICD-10-CM | POA: Diagnosis not present

## 2017-09-27 DIAGNOSIS — S22000A Wedge compression fracture of unspecified thoracic vertebra, initial encounter for closed fracture: Secondary | ICD-10-CM | POA: Insufficient documentation

## 2017-09-27 DIAGNOSIS — X58XXXA Exposure to other specified factors, initial encounter: Secondary | ICD-10-CM | POA: Diagnosis not present

## 2017-09-27 DIAGNOSIS — S32050A Wedge compression fracture of fifth lumbar vertebra, initial encounter for closed fracture: Secondary | ICD-10-CM

## 2017-09-27 DIAGNOSIS — M48061 Spinal stenosis, lumbar region without neurogenic claudication: Secondary | ICD-10-CM | POA: Insufficient documentation

## 2017-09-27 DIAGNOSIS — M899 Disorder of bone, unspecified: Secondary | ICD-10-CM | POA: Diagnosis not present

## 2017-09-27 DIAGNOSIS — S32000A Wedge compression fracture of unspecified lumbar vertebra, initial encounter for closed fracture: Secondary | ICD-10-CM | POA: Diagnosis present

## 2017-09-28 ENCOUNTER — Other Ambulatory Visit: Payer: Self-pay

## 2017-09-28 ENCOUNTER — Encounter: Payer: Self-pay | Admitting: *Deleted

## 2017-09-28 ENCOUNTER — Ambulatory Visit
Admission: AD | Admit: 2017-09-28 | Discharge: 2017-09-28 | Disposition: A | Discharge status: Home / Self Care | Payer: Medicare Other | Source: Ambulatory Visit | Attending: Orthopedic Surgery | Admitting: Orthopedic Surgery

## 2017-09-28 ENCOUNTER — Ambulatory Visit: Payer: Medicare Other | Admitting: Anesthesiology

## 2017-09-28 ENCOUNTER — Ambulatory Visit: Payer: Medicare Other

## 2017-09-28 ENCOUNTER — Encounter
Admission: AD | Disposition: A | Discharge status: Home / Self Care | Payer: Self-pay | Source: Ambulatory Visit | Attending: Orthopedic Surgery

## 2017-09-28 DIAGNOSIS — Z87891 Personal history of nicotine dependence: Secondary | ICD-10-CM | POA: Diagnosis not present

## 2017-09-28 DIAGNOSIS — M4854XA Collapsed vertebra, not elsewhere classified, thoracic region, initial encounter for fracture: Secondary | ICD-10-CM | POA: Insufficient documentation

## 2017-09-28 DIAGNOSIS — Z7951 Long term (current) use of inhaled steroids: Secondary | ICD-10-CM | POA: Diagnosis not present

## 2017-09-28 DIAGNOSIS — Z79899 Other long term (current) drug therapy: Secondary | ICD-10-CM | POA: Insufficient documentation

## 2017-09-28 DIAGNOSIS — K219 Gastro-esophageal reflux disease without esophagitis: Secondary | ICD-10-CM | POA: Diagnosis not present

## 2017-09-28 DIAGNOSIS — Z419 Encounter for procedure for purposes other than remedying health state, unspecified: Secondary | ICD-10-CM

## 2017-09-28 DIAGNOSIS — Z7952 Long term (current) use of systemic steroids: Secondary | ICD-10-CM | POA: Diagnosis not present

## 2017-09-28 DIAGNOSIS — J439 Emphysema, unspecified: Secondary | ICD-10-CM | POA: Insufficient documentation

## 2017-09-28 DIAGNOSIS — M549 Dorsalgia, unspecified: Secondary | ICD-10-CM | POA: Diagnosis present

## 2017-09-28 DIAGNOSIS — Z7982 Long term (current) use of aspirin: Secondary | ICD-10-CM | POA: Diagnosis not present

## 2017-09-28 DIAGNOSIS — Z791 Long term (current) use of non-steroidal anti-inflammatories (NSAID): Secondary | ICD-10-CM | POA: Diagnosis not present

## 2017-09-28 HISTORY — PX: KYPHOPLASTY: SHX5884

## 2017-09-28 HISTORY — DX: Anxiety disorder, unspecified: F41.9

## 2017-09-28 SURGERY — KYPHOPLASTY
Anesthesia: General | Site: Back | Wound class: "Clean "

## 2017-09-28 MED ORDER — FENTANYL CITRATE (PF) 100 MCG/2ML IJ SOLN
INTRAMUSCULAR | Status: DC | PRN
  Administered 2017-09-28 (×2): 50 ug via INTRAVENOUS

## 2017-09-28 MED ORDER — IOPAMIDOL (ISOVUE-M 200) INJECTION 41%
INTRAMUSCULAR | Status: DC | PRN
  Administered 2017-09-28: 20 mL

## 2017-09-28 MED ORDER — PROPOFOL 10 MG/ML IV BOLUS
INTRAVENOUS | Status: DC | PRN
  Administered 2017-09-28: 50 mg via INTRAVENOUS

## 2017-09-28 MED ORDER — PROPOFOL 500 MG/50ML IV EMUL
INTRAVENOUS | Status: AC
  Filled 2017-09-28: qty 50

## 2017-09-28 MED ORDER — ONDANSETRON HCL 4 MG/2ML IJ SOLN
INTRAMUSCULAR | Status: AC
  Filled 2017-09-28: qty 2

## 2017-09-28 MED ORDER — OXYCODONE-ACETAMINOPHEN 5-325 MG PO TABS
ORAL_TABLET | ORAL | Status: AC
  Filled 2017-09-28: qty 1

## 2017-09-28 MED ORDER — CEFAZOLIN SODIUM-DEXTROSE 2-4 GM/100ML-% IV SOLN
INTRAVENOUS | Status: AC
  Filled 2017-09-28: qty 100

## 2017-09-28 MED ORDER — LIDOCAINE HCL 1 % IJ SOLN
INTRAMUSCULAR | Status: DC | PRN
  Administered 2017-09-28: 20 mL

## 2017-09-28 MED ORDER — MIDAZOLAM HCL 2 MG/2ML IJ SOLN
INTRAMUSCULAR | Status: DC | PRN
  Administered 2017-09-28: 2 mg via INTRAVENOUS

## 2017-09-28 MED ORDER — PROPOFOL 500 MG/50ML IV EMUL
INTRAVENOUS | Status: DC | PRN
  Administered 2017-09-28: 100 ug/kg/min via INTRAVENOUS

## 2017-09-28 MED ORDER — FENTANYL CITRATE (PF) 100 MCG/2ML IJ SOLN: 25.0000 ug | INTRAMUSCULAR | Status: DC | PRN

## 2017-09-28 MED ORDER — CEFAZOLIN SODIUM-DEXTROSE 2-4 GM/100ML-% IV SOLN
2.0000 g | Freq: Once | INTRAVENOUS | Status: AC
  Administered 2017-09-28: 2 g via INTRAVENOUS

## 2017-09-28 MED ORDER — BUPIVACAINE-EPINEPHRINE (PF) 0.5% -1:200000 IJ SOLN
INTRAMUSCULAR | Status: DC | PRN
  Administered 2017-09-28: 20 mL

## 2017-09-28 MED ORDER — ONDANSETRON HCL 4 MG/2ML IJ SOLN
4.0000 mg | Freq: Once | INTRAMUSCULAR | Status: AC | PRN
  Administered 2017-09-28: 4 mg via INTRAVENOUS

## 2017-09-28 MED ORDER — LACTATED RINGERS IV SOLN
INTRAVENOUS | Status: DC
  Administered 2017-09-28: 15:00:00 via INTRAVENOUS

## 2017-09-28 MED ORDER — FENTANYL CITRATE (PF) 100 MCG/2ML IJ SOLN
INTRAMUSCULAR | Status: AC
  Filled 2017-09-28: qty 2

## 2017-09-28 MED ORDER — MIDAZOLAM HCL 2 MG/2ML IJ SOLN
INTRAMUSCULAR | Status: AC
  Filled 2017-09-28: qty 2

## 2017-09-28 SURGICAL SUPPLY — 17 items
CEMENT KYPHON CX01A KIT/MIXER (Cement) ×3 IMPLANT
DERMABOND ADVANCED (GAUZE/BANDAGES/DRESSINGS) ×2
DERMABOND ADVANCED .7 DNX12 (GAUZE/BANDAGES/DRESSINGS) ×1 IMPLANT
DEVICE BIOPSY BONE KYPHX (INSTRUMENTS) ×3 IMPLANT
DRAPE C-ARM XRAY 36X54 (DRAPES) ×3 IMPLANT
DURAPREP 26ML APPLICATOR (WOUND CARE) ×3 IMPLANT
GLOVE SURG SYN 9.0  PF PI (GLOVE) ×2
GLOVE SURG SYN 9.0 PF PI (GLOVE) ×1 IMPLANT
GOWN SRG 2XL LVL 4 RGLN SLV (GOWNS) ×1 IMPLANT
GOWN STRL NON-REIN 2XL LVL4 (GOWNS) ×2
GOWN STRL REUS W/ TWL LRG LVL3 (GOWN DISPOSABLE) ×1 IMPLANT
GOWN STRL REUS W/TWL LRG LVL3 (GOWN DISPOSABLE) ×2
PACK KYPHOPLASTY (MISCELLANEOUS) ×3 IMPLANT
STRAP SAFETY 5IN WIDE (MISCELLANEOUS) ×3 IMPLANT
TRAY KYPHOPAK 15/2 EXPRESS (KITS) ×2 IMPLANT
TRAY KYPHOPAK 15/3 EXPRESS 1ST (MISCELLANEOUS) ×1 IMPLANT
TRAY KYPHOPAK 20/3 EXPRESS 1ST (MISCELLANEOUS) ×1 IMPLANT

## 2017-09-28 NOTE — Anesthesia Preprocedure Evaluation (Addendum)
Anesthesia Evaluation  Patient identified by MRN, date of birth, ID band Patient awake    Reviewed: Allergy & Precautions, NPO status , Patient's Chart, lab work & pertinent test results  Airway Mallampati: III       Dental  (+) Edentulous Upper, Edentulous Lower   Pulmonary neg sleep apnea, COPD, former smoker,           Cardiovascular (-) hypertension(-) Past MI and (-) CHF (-) dysrhythmias (-) Valvular Problems/Murmurs     Neuro/Psych neg Seizures Anxiety    GI/Hepatic Neg liver ROS, GERD  Medicated and Controlled,  Endo/Other  neg diabetes  Renal/GU negative Renal ROS     Musculoskeletal   Abdominal   Peds  Hematology  (+) anemia ,   Anesthesia Other Findings   Reproductive/Obstetrics                            Anesthesia Physical Anesthesia Plan  ASA: III  Anesthesia Plan: General   Post-op Pain Management:    Induction: Intravenous  PONV Risk Score and Plan: TIVA, Propofol infusion and Treatment may vary due to age or medical condition  Airway Management Planned: Nasal Cannula  Additional Equipment:   Intra-op Plan:   Post-operative Plan:   Informed Consent: I have reviewed the patients History and Physical, chart, labs and discussed the procedure including the risks, benefits and alternatives for the proposed anesthesia with the patient or authorized representative who has indicated his/her understanding and acceptance.     Plan Discussed with:   Anesthesia Plan Comments:         Anesthesia Quick Evaluation

## 2017-09-28 NOTE — Anesthesia Post-op Follow-up Note (Signed)
Anesthesia QCDR form completed.        

## 2017-09-28 NOTE — Discharge Instructions (Addendum)
Take it easy today and tomorrow, resume more normal activities on Thursday.  Remove Band-Aid on Thursday and then okay to shower.    AMBULATORY SURGERY  DISCHARGE INSTRUCTIONS   1) The drugs that you were given will stay in your system until tomorrow so for the next 24 hours you should not:  A) Drive an automobile B) Make any legal decisions C) Drink any alcoholic beverage   2) You may resume regular meals tomorrow.  Today it is better to start with liquids and gradually work up to solid foods.  You may eat anything you prefer, but it is better to start with liquids, then soup and crackers, and gradually work up to solid foods.   3) Please notify your doctor immediately if you have any unusual bleeding, trouble breathing, redness and pain at the surgery site, drainage, fever, or pain not relieved by medication.    4) Additional Instructions:        Please contact your physician with any problems or Same Day Surgery at [phone], Monday through Friday 6 am to 4 pm, or [place] at [facility] number at [phone].

## 2017-09-28 NOTE — Anesthesia Postprocedure Evaluation (Signed)
Anesthesia Post Note  Patient: Ashley Mack  Procedure(s) Performed: Truitt Merle (N/A Back)  Patient location during evaluation: PACU Anesthesia Type: General Level of consciousness: awake and alert Pain management: pain level controlled Vital Signs Assessment: post-procedure vital signs reviewed and stable Respiratory status: spontaneous breathing, nonlabored ventilation, respiratory function stable and patient connected to nasal cannula oxygen Cardiovascular status: blood pressure returned to baseline and stable Postop Assessment: no apparent nausea or vomiting Anesthetic complications: no     Last Vitals:  Vitals:   09/28/17 1713 09/28/17 1724  BP: (!) 182/89 (!) 144/89  Pulse: 100 94  Resp: 14 16  Temp: 36.6 C 36.6 C  SpO2: 98% 99%    Last Pain:  Vitals:   09/28/17 1724  TempSrc: Temporal  PainSc: 0-No pain                 Martha Clan

## 2017-09-28 NOTE — H&P (Signed)
Reviewed paper H+P, will be scanned into chart. No changes noted.  

## 2017-09-28 NOTE — Transfer of Care (Signed)
Immediate Anesthesia Transfer of Care Note  Patient: Ashley Mack  Procedure(s) Performed: Truitt Merle (N/A Back)  Patient Location: PACU  Anesthesia Type:General  Level of Consciousness: awake and sedated  Airway & Oxygen Therapy: Patient Spontanous Breathing and Patient connected to nasal cannula oxygen  Post-op Assessment: Report given to RN and Post -op Vital signs reviewed and stable  Post vital signs: Reviewed and stable  Last Vitals:  Vitals Value Taken Time  BP 114/64 09/28/2017  4:31 PM  Temp 36.3 C 09/28/2017  4:31 PM  Pulse 105 09/28/2017  4:33 PM  Resp 15 09/28/2017  4:33 PM  SpO2 100 % 09/28/2017  4:33 PM  Vitals shown include unvalidated device data.  Last Pain:  Vitals:   09/28/17 1425  PainSc: 5       Patients Stated Pain Goal: 1 (84/06/98 6148)  Complications: No apparent anesthesia complications

## 2017-09-28 NOTE — Op Note (Signed)
09/28/2017  4:37 PM  PATIENT:  Ashley Mack  72 y.o. female     Signed           Show:Clear all _0 Manual_1 Template_2 Copied  Added by: _3 Hessie Knows, MD   _4 Hover for details   09/28/2017  4:32 PM    PRE-OPERATIVE DIAGNOSIS:  closed compression fracture T11  POST-OPERATIVE DIAGNOSIS:  closed compression fracture T11  PROCEDURE:  Procedure(s): KYPHOPLASTY-T 11 (N/A)  SURGEON: Laurene Footman, MD  ASSISTANTS: None  ANESTHESIA:   local and MAC  EBL:  Total I/O In: 400 [I.V.:400] Out: 5 [Blood:5]  BLOOD ADMINISTERED:none  DRAINS: none   LOCAL MEDICATIONS USED:  MARCAINE    and XYLOCAINE   SPECIMEN:  No Specimen  DISPOSITION OF SPECIMEN:  N/A  COUNTS:  YES  TOURNIQUET:  * No tourniquets in log *  IMPLANTS: None cement  DICTATION: .Dragon Dictation  patient brought the operating room and after adequate sedation was given the patient was placed prone. C arm brought in in good visualization was obtained in both AP and lateral projections. After patient identification and timeout procedures were completed, 10 cc of 1% Xylocaine was infiltrated on the right side in the area of the planned incision. The back was then prepped and draped in the usual sterile fashion and repeat timeout procedure carried out. Spinal needle was then used to get down to the pedicle on the right side at T 11 and a 50-50 mix of 1% Xylocaine, half percent Sensorcaine with epinephrine was injected along the tract down to the bone. After allowing this to set a small incision was made and a trocar advanced in an extrapedicular fashion into the vertebral body where a biopsy was attempted but not obtained. Drilling was carried out and then inflation of the balloon to about 3 cc. The cement was mixed and was the appropriate consistency approximately 3-1/2 cc of bone cement was infiltrated into the body without extravasation getting good fill from superior to inferior  endplates and right and left side. After the cemented set trochars removed and permanent serum views were obtained. Skin closed with Dermabond followed by Band-Aid    PLAN OF CARE: Discharge to home after PACU  PATIENT DISPOSITION:  PACU - hemodynamically stable.

## 2017-09-28 NOTE — OR Nursing (Signed)
Nauseated, clear emesis on arrival, zofran given.  Denied nausea approx 10 min later, denies pain, requests to go home, will resume po's at home and will call MD if med needed for nausea.

## 2017-09-28 NOTE — Anesthesia Procedure Notes (Signed)
Date/Time: 09/28/2017 4:09 PM Performed by: Nelda Marseille, CRNA Pre-anesthesia Checklist: Patient identified, Emergency Drugs available, Suction available, Patient being monitored and Timeout performed Oxygen Delivery Method: Nasal cannula

## 2017-09-29 ENCOUNTER — Encounter: Payer: Self-pay | Admitting: Orthopedic Surgery

## 2017-10-18 ENCOUNTER — Other Ambulatory Visit: Payer: Self-pay | Admitting: Orthopedic Surgery

## 2017-10-18 DIAGNOSIS — M549 Dorsalgia, unspecified: Secondary | ICD-10-CM

## 2017-10-19 ENCOUNTER — Encounter
Admission: RE | Admit: 2017-10-19 | Discharge: 2017-10-19 | Disposition: A | Discharge status: Home / Self Care | Payer: Medicare Other | Source: Ambulatory Visit | Attending: Orthopedic Surgery | Admitting: Orthopedic Surgery

## 2017-10-19 DIAGNOSIS — M549 Dorsalgia, unspecified: Secondary | ICD-10-CM | POA: Diagnosis present

## 2017-10-19 MED ORDER — TECHNETIUM TC 99M MEDRONATE IV KIT
22.7700 | PACK | Freq: Once | INTRAVENOUS | Status: AC | PRN
  Administered 2017-10-19: 22.77 via INTRAVENOUS

## 2017-10-21 ENCOUNTER — Inpatient Hospital Stay: Payer: Medicare Other | Attending: Hematology and Oncology

## 2017-10-21 ENCOUNTER — Ambulatory Visit: Payer: Medicare Other

## 2017-10-21 DIAGNOSIS — E538 Deficiency of other specified B group vitamins: Secondary | ICD-10-CM | POA: Diagnosis present

## 2017-10-21 MED ORDER — CYANOCOBALAMIN 1000 MCG/ML IJ SOLN
1000.0000 ug | Freq: Once | INTRAMUSCULAR | Status: AC
  Administered 2017-10-21: 1000 ug via INTRAMUSCULAR

## 2017-11-18 ENCOUNTER — Inpatient Hospital Stay: Payer: Medicare Other

## 2017-11-18 DIAGNOSIS — M546 Pain in thoracic spine: Secondary | ICD-10-CM | POA: Insufficient documentation

## 2017-11-19 ENCOUNTER — Telehealth: Payer: Self-pay | Admitting: *Deleted

## 2017-11-19 ENCOUNTER — Other Ambulatory Visit: Payer: Self-pay | Admitting: Neurosurgery

## 2017-11-19 DIAGNOSIS — S32000A Wedge compression fracture of unspecified lumbar vertebra, initial encounter for closed fracture: Secondary | ICD-10-CM

## 2017-11-19 NOTE — Telephone Encounter (Signed)
-----   Message from Lequita Asal, MD sent at 11/19/2017  4:21 PM EDT -----  Are they willing to learn how to do injections?  M  ----- Message ----- From: Shirlean Kelly, RN Sent: 11/19/2017   2:46 PM EDT To: Lequita Asal, MD  I contacted [facility] [place] and they do not go into the home for B-12 injections. ----- Message ----- From: Lequita Asal, MD Sent: 11/18/2017   1:57 PM EDT To: Shirlean Kelly, RN   Maybe home health can do.  Can you see if that can be done?  M  ----- Message ----- From: [place] Sent: 11/18/2017   8:44 AM EDT To: Lequita Asal, MD, Shirlean Kelly, RN  Pt is not coming today for lab B12 has back surgery done and wants to inquire about getting the B12 shot at home. Please call her to discuss this option

## 2017-11-19 NOTE — Telephone Encounter (Signed)
Called patient regarding her B-12 injections.  She states her daughter can give her the shots if MD will call in medication, syringes and needles.  Her pharmacy is [facility] on [address].

## 2017-11-22 ENCOUNTER — Other Ambulatory Visit: Payer: Self-pay | Admitting: Neurosurgery

## 2017-11-22 DIAGNOSIS — M546 Pain in thoracic spine: Secondary | ICD-10-CM

## 2017-11-23 ENCOUNTER — Ambulatory Visit
Admission: RE | Admit: 2017-11-23 | Discharge: 2017-11-23 | Disposition: A | Discharge status: Home / Self Care | Payer: Medicare Other | Source: Ambulatory Visit | Attending: Neurosurgery | Admitting: Neurosurgery

## 2017-11-23 ENCOUNTER — Telehealth: Payer: Self-pay | Admitting: *Deleted

## 2017-11-23 ENCOUNTER — Other Ambulatory Visit: Payer: Self-pay | Admitting: Urgent Care

## 2017-11-23 DIAGNOSIS — S32000A Wedge compression fracture of unspecified lumbar vertebra, initial encounter for closed fracture: Secondary | ICD-10-CM

## 2017-11-23 DIAGNOSIS — M546 Pain in thoracic spine: Secondary | ICD-10-CM

## 2017-11-23 MED ORDER — B-12 1000 MCG/ML IJ KIT: 1.0000 mL | PACK | INTRAMUSCULAR | 2 refills | Status: DC

## 2017-11-23 NOTE — Telephone Encounter (Signed)
Orders sent to pharmacy for B12 1,000 mcg/mL kit per patient request.

## 2017-11-23 NOTE — Telephone Encounter (Signed)
[name], can you call in the B-12 and and supplies needed for this patient.  Thanks!

## 2017-11-23 NOTE — Telephone Encounter (Signed)
Patient called asking about need for B12 injection. Please advise  Shirlean Kelly, RN       11/19/17 4:52 PM  Note    Called patient regarding her B-12 injections.  She states her daughter can give her the shots if MD will call in medication, syringes and needles.  Her pharmacy is [facility] on [address].     There are no orders in Epic for these supplies

## 2018-02-21 ENCOUNTER — Other Ambulatory Visit: Payer: Medicare Other

## 2018-02-22 ENCOUNTER — Ambulatory Visit: Payer: Medicare Other

## 2018-02-22 ENCOUNTER — Ambulatory Visit: Payer: Medicare Other | Admitting: Hematology and Oncology

## 2018-02-28 ENCOUNTER — Other Ambulatory Visit: Payer: Self-pay | Admitting: Urgent Care

## 2018-02-28 ENCOUNTER — Inpatient Hospital Stay: Payer: Medicare Other | Attending: Hematology and Oncology

## 2018-02-28 DIAGNOSIS — D509 Iron deficiency anemia, unspecified: Secondary | ICD-10-CM | POA: Diagnosis not present

## 2018-02-28 DIAGNOSIS — E538 Deficiency of other specified B group vitamins: Secondary | ICD-10-CM | POA: Insufficient documentation

## 2018-02-28 LAB — CBC WITH DIFFERENTIAL/PLATELET
ABS IMMATURE GRANULOCYTES: 0.07 10*3/uL (ref 0.00–0.07)
BASOS PCT: 0 %
Basophils Absolute: 0.1 10*3/uL (ref 0.0–0.1)
Eosinophils Absolute: 0 10*3/uL (ref 0.0–0.5)
Eosinophils Relative: 0 %
HEMATOCRIT: 35.3 % — AB (ref 36.0–46.0)
Hemoglobin: 11.2 g/dL — ABNORMAL LOW (ref 12.0–15.0)
IMMATURE GRANULOCYTES: 1 %
LYMPHS ABS: 1.4 10*3/uL (ref 0.7–4.0)
Lymphocytes Relative: 11 %
MCH: 28.8 pg (ref 26.0–34.0)
MCHC: 31.7 g/dL (ref 30.0–36.0)
MCV: 90.7 fL (ref 80.0–100.0)
MONOS PCT: 5 %
Monocytes Absolute: 0.6 10*3/uL (ref 0.1–1.0)
NEUTROS ABS: 10.2 10*3/uL — AB (ref 1.7–7.7)
NEUTROS PCT: 83 %
PLATELETS: 464 10*3/uL — AB (ref 150–400)
RBC: 3.89 MIL/uL (ref 3.87–5.11)
RDW: 14.9 % (ref 11.5–15.5)
WBC: 12.3 10*3/uL — ABNORMAL HIGH (ref 4.0–10.5)
nRBC: 0 % (ref 0.0–0.2)

## 2018-02-28 LAB — FERRITIN: Ferritin: 15 ng/mL (ref 11–307)

## 2018-03-01 ENCOUNTER — Ambulatory Visit: Payer: Medicare Other | Admitting: Hematology and Oncology

## 2018-03-01 ENCOUNTER — Ambulatory Visit: Payer: Medicare Other

## 2018-03-03 ENCOUNTER — Inpatient Hospital Stay (HOSPITAL_BASED_OUTPATIENT_CLINIC_OR_DEPARTMENT_OTHER): Payer: Medicare Other | Admitting: Hematology and Oncology

## 2018-03-03 ENCOUNTER — Inpatient Hospital Stay: Payer: Medicare Other

## 2018-03-03 ENCOUNTER — Encounter: Payer: Self-pay | Admitting: Hematology and Oncology

## 2018-03-03 ENCOUNTER — Other Ambulatory Visit: Payer: Self-pay

## 2018-03-03 VITALS — BP 144/84 | HR 86 | Resp 20

## 2018-03-03 VITALS — BP 158/89 | HR 99 | Temp 96.6°F | Resp 18 | Wt 139.2 lb

## 2018-03-03 DIAGNOSIS — E538 Deficiency of other specified B group vitamins: Secondary | ICD-10-CM

## 2018-03-03 DIAGNOSIS — D509 Iron deficiency anemia, unspecified: Secondary | ICD-10-CM | POA: Diagnosis not present

## 2018-03-03 DIAGNOSIS — M069 Rheumatoid arthritis, unspecified: Secondary | ICD-10-CM | POA: Diagnosis not present

## 2018-03-03 DIAGNOSIS — Z79899 Other long term (current) drug therapy: Secondary | ICD-10-CM | POA: Diagnosis not present

## 2018-03-03 DIAGNOSIS — Z92241 Personal history of systemic steroid therapy: Secondary | ICD-10-CM

## 2018-03-03 MED ORDER — IRON SUCROSE 20 MG/ML IV SOLN
200.0000 mg | Freq: Once | INTRAVENOUS | Status: AC
  Administered 2018-03-03: 200 mg via INTRAVENOUS
  Filled 2018-03-03: qty 10

## 2018-03-03 MED ORDER — SODIUM CHLORIDE 0.9 % IV SOLN
Freq: Once | INTRAVENOUS | Status: AC
  Administered 2018-03-03: 12:00:00 via INTRAVENOUS
  Filled 2018-03-03: qty 250

## 2018-03-03 NOTE — Progress Notes (Signed)
Here for follow up. Stated overall she feels" pretty good " pulse ox 98% on RA

## 2018-03-03 NOTE — Progress Notes (Signed)
[facility] day:  03/03/2018  Chief Complaint: Ashley Mack is a 72 y.o. female with iron deficiency anemia and B12 deficiency who is seen for 6 month assessment.  HPI:  The patient was last seen in the hematology clinic on 08/26/2017.   At that time, she remained fatigued.  Exam was stable.  Hemoglobin was 13.5, hematocrit 42, MCV 85.9, and platelets 502,000.  WBC was chronically elevated due to corticosteroid use; 12,500. Ferritin was 56.  She last received monthly B12 on 10/21/2017.  Orders for B12 kit were sent on 11/23/2017.  Patient receives her injections from her chiropractor (last 02/17/2018).  CBC on 02/28/2018 revealed a hematocrit of 35.3, hemoglobin 11.2, and MCV 90.7.  Ferritin was 15.  Patient has had 5 kyphoplasty procedures since May 2019 due to compression fractures. Back feels "a lot better".   During the interim, patient is doing well. She denies any acute concerns. Energy is stable. Patient is not having any significant shortness of breath. She bruises easily and notes that she has "always had fragile skin". Patient denies that she has experienced any B symptoms. She denies any interval infections. Patient denies bleeding; no hematochezia, melena, or gross hematuria.   Patient is eating better following recovering from her orthopedic procedures. Patient has plans to go to [place] after clinic today. She states, "I am hungry. I plan on eating a whole loaf of bread". Weight today is 139 lb 3.2 oz (63.1 kg), which compared to her last visit to the clinic, represents a 6 pound loss. Patient on daily oral iron supplementation.   Patient denies pain in the clinic today.   Past Medical History:  Diagnosis Date  . Anemia   . Anxiety   . Arthritis    Rheumatoid  . [place] of gallbladder   . 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 Centrilobular emphysema ([place])   . Dyspnea   . Fatty liver   . GERD (gastroesophageal reflux  disease)   . History of fractured vertebra    sees chiropractor  . 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 Iron (Fe) deficiency anemia   . Migraine headache    in past  . Migraines   . Motion sickness   . 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 Osteoporosis   . Rheumatoid arthritis ([place])   . Vertigo     Past Surgical History:  Procedure Laterality Date  . CATARACT EXTRACTION W/PHACO Right 08/12/2016   Procedure: CATARACT EXTRACTION PHACO AND INTRAOCULAR LENS PLACEMENT ([place])  Right;  Surgeon: Leandrew Koyanagi, MD;  Location: [facility];  Service: Ophthalmology;  Laterality: Right;  . CATARACT EXTRACTION W/PHACO Left 09/30/2016   Procedure: CATARACT EXTRACTION PHACO AND INTRAOCULAR LENS PLACEMENT ([place]) Left;  Surgeon: Leandrew Koyanagi, MD;  Location: [facility];  Service: Ophthalmology;  Laterality: Left;  . COLONOSCOPY    . COLONOSCOPY WITH PROPOFOL N/A 12/09/2016   Procedure: COLONOSCOPY WITH PROPOFOL;  Surgeon: Toledo, Benay Pike, MD;  Location: [facility];  Service: Endoscopy;  Laterality: N/A;  . ESOPHAGOGASTRODUODENOSCOPY    . ESOPHAGOGASTRODUODENOSCOPY (EGD) WITH PROPOFOL N/A 12/09/2016   Procedure: ESOPHAGOGASTRODUODENOSCOPY (EGD) WITH PROPOFOL;  Surgeon: Toledo, Benay Pike, MD;  Location: [facility];  Service: Endoscopy;  Laterality: N/A;  . KYPHOPLASTY N/A 08/10/2017   Procedure: Hewitt Shorts;  Surgeon: Hessie Knows, MD;  Location: [facility];  Service: Orthopedics;  Laterality: N/A;  . KYPHOPLASTY N/A 09/09/2017   Procedure: KYPHOPLASTY-L1,L4;  Surgeon: Hessie Knows, MD;  Location: [facility];  Service: Orthopedics;  Laterality: N/A;  . KYPHOPLASTY N/A 09/28/2017   Procedure: [id number];  Surgeon: Hessie Knows, MD;  Location: [facility];  Service: Orthopedics;  Laterality: N/A;  . OOPHORECTOMY Bilateral 2003    Family History  Problem Relation Age of Onset  . Kidney disease Mother   . Diabetes Mellitus II Brother     Social History:   reports that she quit smoking about 10 years ago. Her smoking use included cigarettes. She smoked 1.00 pack per day. She has never used smokeless tobacco. She reports that she does not drink alcohol or use drugs.  She quit smoking in 2009.  She lives in [place].  The patient is accompanied by [employer], her step daughter, today.  Allergies:  Allergies  Allergen Reactions  . Methotrexate Derivatives Nausea And Vomiting and Other (See Comments)    Flu like symptoms  . Other Nausea Only and Other (See Comments)    Arthritis medications . Unsure of what the med was.  . Ranitidine Hives  . Sulfasalazine Other (See Comments)    Stomach upset. Unsure of reaction. Long time ago  . Topiramate Nausea Only    Current Medications: Current Outpatient Medications  Medication Sig Dispense Refill  . budesonide-formoterol (SYMBICORT) 80-4.5 MCG/ACT inhaler Inhale 2 puffs into the lungs every morning.     . Cholecalciferol (VITAMIN D3) 2000 UNITS capsule Take 2,000 Units by mouth daily.     . Cyanocobalamin (B-12) 1000 MCG/ML KIT Inject 1 mL as directed every 30 (thirty) days. 3 kit 2  . cyclobenzaprine (FLEXERIL) 10 MG tablet 1/2-1 po tid prn    . DEXILANT 60 MG capsule Take 60 mg by mouth daily.     . Fe Fum-FePoly-FA-Vit C-Vit B3 (INTEGRA F) 125-1 MG CAPS Integra 125 mg-40 mg-3 mg capsule    . Fe Fum-FePoly-Vit C-Vit B3 (INTEGRA) 62.5-62.5-40-3 MG CAPS Take 1 capsule by mouth daily.    . predniSONE (DELTASONE) 5 MG tablet Take 5 mg by mouth daily.     . fluticasone (FLONASE) 50 MCG/ACT nasal spray Place 1-2 sprays into both nostrils daily as needed for allergies or rhinitis.     Marland Kitchen HYDROcodone-acetaminophen (NORCO) 5-325 MG tablet Take 1 tablet by mouth every 6 (six) hours as needed for moderate pain. (Patient not taking: Reported on 03/03/2018) 20 tablet 0  . naproxen sodium (ANAPROX) 220 MG tablet Take 220 mg by mouth daily as needed (for pain or headache).      No current facility-administered  medications for this visit.     Review of Systems:  GENERAL:  Feels "pretty good".  No fevers, sweats.  Weight loss of 6 pounds. PERFORMANCE STATUS (ECOG):  1 HEENT:  No visual changes, runny nose, sore throat, mouth sores or tenderness. Lungs: Shortness of breath with exertion, improved.  No cough.  No hemoptysis. Cardiac:  No chest pain, palpitations, orthopnea, or PND. GI:  No nausea, vomiting, diarrhea, constipation, melena or hematochezia. GU:  No urgency, frequency, dysuria, or hematuria. Musculoskeletal:  Patient s/p 5 kyphoplasties since 04/2017.  Back feels "a lot better".  No joint pain.  No muscle tenderness. Extremities:  No pain or swelling. Skin:  Fragile skin.  No rashes or skin changes. Neuro:  No headache, numbness or weakness, balance or coordination issues. Endocrine:  No diabetes, thyroid issues, hot flashes or night sweats. Psych:  No mood changes, depression or anxiety. Pain:  No focal pain. Review of systems:  All other systems  reviewed and found to be negative.   Physical Exam: Blood pressure (!) 158/89, pulse 99, temperature (!) 96.6 F (35.9 C), temperature source Tympanic, resp. rate 18, weight 139 lb 3.2 oz (63.1 kg). GENERAL:  Well developed, well nourished, woman sitting comfortably in the exam room in no acute distress. MENTAL STATUS:  Alert and oriented to person, place and time. HEAD:  Short brown hair.  Normocephalic, atraumatic, face symmetric, no Cushingoid features. EYES:  Glasses.  Blue eyes.  Pupils equal round and reactive to light and accomodation.  No conjunctivitis or scleral icterus. ENT:  Oropharynx clear without lesion.  Tongue normal. Mucous membranes moist.  RESPIRATORY:  Clear to auscultation without rales, wheezes or rhonchi. CARDIOVASCULAR:  Regular rate and rhythm without murmur, rub or gallop. ABDOMEN:  Soft, non-tender, with active bowel sounds, and no hepatosplenomegaly.  No masses. SKIN:  No rashes, ulcers or  lesions. EXTREMITIES: No edema, no skin discoloration or tenderness.  No palpable cords. LYMPH NODES: No palpable cervical, supraclavicular, axillary or inguinal adenopathy  NEUROLOGICAL: Unremarkable. PSYCH:  Appropriate.    No visits with results within 3 Day(s) from this visit.  Latest known visit with results is:  Appointment on 02/28/2018  Component Date Value Ref Range Status  . Ferritin 02/28/2018 15  11 - 307 ng/mL Final   Performed at [facility], [address] [place], [place] [address]  . WBC 02/28/2018 12.3* 4.0 - 10.5 K/uL Final  . RBC 02/28/2018 3.89  3.87 - 5.11 MIL/uL Final  . Hemoglobin 02/28/2018 11.2* 12.0 - 15.0 g/dL Final  . HCT 02/28/2018 35.3* 36.0 - 46.0 % Final  . MCV 02/28/2018 90.7  80.0 - 100.0 fL Final  . MCH 02/28/2018 28.8  26.0 - 34.0 pg Final  . MCHC 02/28/2018 31.7  30.0 - 36.0 g/dL Final  . RDW 02/28/2018 14.9  11.5 - 15.5 % Final  . Platelets 02/28/2018 464* 150 - 400 K/uL Final  . nRBC 02/28/2018 0.0  0.0 - 0.2 % Final  . Neutrophils Relative % 02/28/2018 83  % Final  . Neutro Abs 02/28/2018 10.2* 1.7 - 7.7 K/uL Final  . Lymphocytes Relative 02/28/2018 11  % Final  . Lymphs Abs 02/28/2018 1.4  0.7 - 4.0 K/uL Final  . Monocytes Relative 02/28/2018 5  % Final  . Monocytes Absolute 02/28/2018 0.6  0.1 - 1.0 K/uL Final  . Eosinophils Relative 02/28/2018 0  % Final  . Eosinophils Absolute 02/28/2018 0.0  0.0 - 0.5 K/uL Final  . Basophils Relative 02/28/2018 0  % Final  . Basophils Absolute 02/28/2018 0.1  0.0 - 0.1 K/uL Final  . Immature Granulocytes 02/28/2018 1  % Final  . Abs Immature Granulocytes 02/28/2018 0.07  0.00 - 0.07 K/uL Final   Performed at [facility], [address] [place],  [address]    Assessment:  Ashley Mack is a 72 y.o. female with iron deficiency anemia and B12 deficiency.  Diet is good.  She was on oral iron (Integra) for years.  She is unsure of any melena or hematochezia as she can not see  well.    Work-up on 08/31/2016 revealed a hematocrit 30.6, hemoglobin 9.9, and MCV 83.2.  B12 was 90.  Ferritin was 13.  Sed rate was 45 (elevated).  Retic was 2.6%.  Normal studies included the following:  Coombs, folate, iron saturation, TIBC, SPEP, and free light chain ratio.  Normal studies on 07/29/2016 included: creatinine, calcium, albumin, protein, liver function tests, and TSH.  She had a colonoscopy on 03/18/2009. She has had several EGDs with the last on 10/12/2008.  She states that she has had polyps, reflux and a hiatal hernia. EGD on 12/09/2016 was normal, other than demonstrating a small hiatal hernia. Colonoscopy on 12/09/2016 demonstrated friability with contact bleeding in the proximal transverse colon. A 1 mm sessile polyp was found in the sigmoid colon, in addition to grade II non-bleeding internal hemorrhoids were also noted. Pathology results demonstrated a tubular adenoma that was negative for dyplasia or malignancy. 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   She began weekly B12 on 09/08/2016 and continued x 6 weeks (last 10/16/2016). She began monthly B12 on 11/26/2016 (last 02/17/2018).    She received Venofer on 09/08/2016, 09/15/2016, 09/25/2016, 11/26/2016, 12/16/2016, 12/24/2016, and x3 (05/27/2017 - 06/10/2017).  She received Venofer if her ferritin is < 30.  Ferritin has been followed: 16 on 11/26/2016, 34 on 03/01/2017, 15 on 05/26/2017, 59 on 07/08/2017, 56 on 08/25/2017, and 15 on 02/28/2018.  She has rheumatoid arthritis.  She has been treated with methotrexate, Imuran, leflunomide, and sulfasalazine.  She is on prednisone 5 mg a day x 2 years.  She is s/p kyphoplasty x 5 since 04/2017.  Symptomatically, she is feeling better.  Exam is unremarkable.  Hemoglobin is 11.2.  Ferritin is 15.  Plan: 1.  Review labs from 02/28/2018. 2.  Iron deficiency  Hemoglobin 11.2.  MCV 90.7.  Ferritin 15.    Discuss plan for Venofer as ferritin < 30.  Venofer today and on  03/10/2018. 3.  B12 deficiency  Patient receives B12 monthly with chiropracter (last 02/17/2018).  Check folate periodically. 4.  RTC in 3 months for labs (CBC with diff, ferritin). 5.  RTC in 6 months for MD assessment, labs (CBC with diff, ferritin, folate - day before) and +/- Venofer.   Honor Loh, NP  03/03/2018, 10:55 AM   I saw and evaluated the patient, participating in the key portions of the service and reviewing pertinent diagnostic studies and records.  I reviewed the nurse practitioner's note and agree with the findings and the plan.  The assessment and plan were discussed with the patient.  A few questions were asked by the patient and answered.   Nolon Stalls, MD 03/03/2018,10:55 AM

## 2018-03-10 ENCOUNTER — Inpatient Hospital Stay: Payer: Medicare Other

## 2018-03-10 VITALS — BP 131/75 | HR 91 | Temp 98.3°F | Resp 18

## 2018-03-10 DIAGNOSIS — E538 Deficiency of other specified B group vitamins: Secondary | ICD-10-CM

## 2018-03-10 DIAGNOSIS — D509 Iron deficiency anemia, unspecified: Secondary | ICD-10-CM | POA: Diagnosis not present

## 2018-03-10 MED ORDER — IRON SUCROSE 20 MG/ML IV SOLN
200.0000 mg | Freq: Once | INTRAVENOUS | Status: AC
  Administered 2018-03-10: 200 mg via INTRAVENOUS
  Filled 2018-03-10: qty 10

## 2018-03-10 MED ORDER — SODIUM CHLORIDE 0.9 % IV SOLN
Freq: Once | INTRAVENOUS | Status: AC
  Administered 2018-03-10: 14:00:00 via INTRAVENOUS
  Filled 2018-03-10: qty 250

## 2018-05-05 ENCOUNTER — Other Ambulatory Visit (HOSPITAL_COMMUNITY): Payer: Self-pay | Admitting: Gastroenterology

## 2018-05-05 ENCOUNTER — Other Ambulatory Visit: Payer: Self-pay | Admitting: Gastroenterology

## 2018-05-05 DIAGNOSIS — R0989 Other specified symptoms and signs involving the circulatory and respiratory systems: Secondary | ICD-10-CM

## 2018-05-05 DIAGNOSIS — I700 Atherosclerosis of aorta: Secondary | ICD-10-CM

## 2018-05-05 DIAGNOSIS — Z9181 History of falling: Secondary | ICD-10-CM | POA: Insufficient documentation

## 2018-05-12 ENCOUNTER — Ambulatory Visit
Admission: RE | Admit: 2018-05-12 | Discharge: 2018-05-12 | Disposition: A | Discharge status: Home / Self Care | Payer: Medicare Other | Source: Ambulatory Visit | Attending: Gastroenterology | Admitting: Gastroenterology

## 2018-05-12 DIAGNOSIS — I700 Atherosclerosis of aorta: Secondary | ICD-10-CM | POA: Diagnosis present

## 2018-05-12 DIAGNOSIS — R0989 Other specified symptoms and signs involving the circulatory and respiratory systems: Secondary | ICD-10-CM | POA: Diagnosis present

## 2018-06-01 ENCOUNTER — Inpatient Hospital Stay: Payer: Medicare Other | Attending: Hematology and Oncology

## 2018-06-02 ENCOUNTER — Other Ambulatory Visit: Payer: Medicare Other

## 2018-06-21 ENCOUNTER — Inpatient Hospital Stay: Payer: Medicare Other | Attending: Internal Medicine

## 2018-06-21 ENCOUNTER — Other Ambulatory Visit: Payer: Self-pay

## 2018-06-21 DIAGNOSIS — D509 Iron deficiency anemia, unspecified: Secondary | ICD-10-CM | POA: Diagnosis not present

## 2018-06-21 LAB — CBC WITH DIFFERENTIAL/PLATELET
Abs Immature Granulocytes: 0.08 10*3/uL — ABNORMAL HIGH (ref 0.00–0.07)
Basophils Absolute: 0.1 10*3/uL (ref 0.0–0.1)
Basophils Relative: 0 %
Eosinophils Absolute: 0 10*3/uL (ref 0.0–0.5)
Eosinophils Relative: 0 %
HCT: 35.8 % — ABNORMAL LOW (ref 36.0–46.0)
Hemoglobin: 11.5 g/dL — ABNORMAL LOW (ref 12.0–15.0)
Immature Granulocytes: 1 %
Lymphocytes Relative: 10 %
Lymphs Abs: 1.6 10*3/uL (ref 0.7–4.0)
MCH: 28.9 pg (ref 26.0–34.0)
MCHC: 32.1 g/dL (ref 30.0–36.0)
MCV: 89.9 fL (ref 80.0–100.0)
Monocytes Absolute: 0.6 10*3/uL (ref 0.1–1.0)
Monocytes Relative: 4 %
Neutro Abs: 13.2 10*3/uL — ABNORMAL HIGH (ref 1.7–7.7)
Neutrophils Relative %: 85 %
Platelets: 429 10*3/uL — ABNORMAL HIGH (ref 150–400)
RBC: 3.98 MIL/uL (ref 3.87–5.11)
RDW: 15.2 % (ref 11.5–15.5)
WBC: 15.5 10*3/uL — ABNORMAL HIGH (ref 4.0–10.5)
nRBC: 0 % (ref 0.0–0.2)

## 2018-06-21 LAB — FERRITIN: Ferritin: 17 ng/mL (ref 11–307)

## 2018-06-22 ENCOUNTER — Inpatient Hospital Stay: Payer: Medicare Other

## 2018-06-22 VITALS — BP 165/75 | HR 82 | Temp 97.1°F | Resp 18

## 2018-06-22 DIAGNOSIS — D509 Iron deficiency anemia, unspecified: Secondary | ICD-10-CM | POA: Diagnosis not present

## 2018-06-22 DIAGNOSIS — E538 Deficiency of other specified B group vitamins: Secondary | ICD-10-CM

## 2018-06-22 MED ORDER — IRON SUCROSE 20 MG/ML IV SOLN
200.0000 mg | Freq: Once | INTRAVENOUS | Status: AC
  Administered 2018-06-22: 14:00:00 200 mg via INTRAVENOUS

## 2018-06-22 MED ORDER — SODIUM CHLORIDE 0.9 % IV SOLN
Freq: Once | INTRAVENOUS | Status: AC
  Administered 2018-06-22: 14:00:00 via INTRAVENOUS
  Filled 2018-06-22: qty 250

## 2018-06-22 NOTE — Patient Instructions (Signed)
Iron Sucrose injection What is this medicine? IRON SUCROSE (AHY ern SOO krohs) is an iron complex. Iron is used to make healthy red blood cells, which carry oxygen and nutrients throughout the body. This medicine is used to treat iron deficiency anemia in people with chronic kidney disease. This medicine may be used for other purposes; ask your health care provider or pharmacist if you have questions. COMMON BRAND NAME(S): Venofer What should I tell my health care provider before I take this medicine? They need to know if you have any of these conditions: -anemia not caused by low iron levels -heart disease -high levels of iron in the blood -kidney disease -liver disease -an unusual or allergic reaction to iron, other medicines, foods, dyes, or preservatives -pregnant or trying to get pregnant -breast-feeding How should I use this medicine? This medicine is for infusion into a vein. It is given by a health care professional in a hospital or clinic setting. Talk to your pediatrician regarding the use of this medicine in children. While this drug may be prescribed for children as young as 2 years for selected conditions, precautions do apply. Overdosage: If you think you have taken too much of this medicine contact a poison control center or emergency room at once. NOTE: This medicine is only for you. Do not share this medicine with others. What if I miss a dose? It is important not to miss your dose. Call your doctor or health care professional if you are unable to keep an appointment. What may interact with this medicine? Do not take this medicine with any of the following medications: -deferoxamine -dimercaprol -other iron products This medicine may also interact with the following medications: -chloramphenicol -deferasirox This list may not describe all possible interactions. Give your health care provider a list of all the medicines, herbs, non-prescription drugs, or dietary  supplements you use. Also tell them if you smoke, drink alcohol, or use illegal drugs. Some items may interact with your medicine. What should I watch for while using this medicine? Visit your doctor or healthcare professional regularly. Tell your doctor or healthcare professional if your symptoms do not start to get better or if they get worse. You may need blood work done while you are taking this medicine. You may need to follow a special diet. Talk to your doctor. Foods that contain iron include: whole grains/cereals, dried fruits, beans, or peas, leafy green vegetables, and organ meats (liver, kidney). What side effects may I notice from receiving this medicine? Side effects that you should report to your doctor or health care professional as soon as possible: -allergic reactions like skin rash, itching or hives, swelling of the face, lips, or tongue -breathing problems -changes in blood pressure -cough -fast, irregular heartbeat -feeling faint or lightheaded, falls -fever or chills -flushing, sweating, or hot feelings -joint or muscle aches/pains -seizures -swelling of the ankles or feet -unusually weak or tired Side effects that usually do not require medical attention (report to your doctor or health care professional if they continue or are bothersome): -diarrhea -feeling achy -headache -irritation at site where injected -nausea, vomiting -stomach upset -tiredness This list may not describe all possible side effects. Call your doctor for medical advice about side effects. You may report side effects to FDA at 1-800-FDA-1088. Where should I keep my medicine? This drug is given in a hospital or clinic and will not be stored at home. NOTE: This sheet is a summary. It may not cover all possible information. If   you have questions about this medicine, talk to your doctor, pharmacist, or health care provider.  2019 Elsevier/Gold Standard (2010-12-11 17:14:35)  

## 2018-06-29 ENCOUNTER — Other Ambulatory Visit: Payer: Self-pay

## 2018-06-29 ENCOUNTER — Inpatient Hospital Stay: Payer: Medicare Other | Attending: Hematology and Oncology

## 2018-06-29 VITALS — BP 172/68 | HR 75 | Temp 97.0°F | Resp 18

## 2018-06-29 DIAGNOSIS — D509 Iron deficiency anemia, unspecified: Secondary | ICD-10-CM | POA: Diagnosis present

## 2018-06-29 DIAGNOSIS — E538 Deficiency of other specified B group vitamins: Secondary | ICD-10-CM

## 2018-06-29 MED ORDER — IRON SUCROSE 20 MG/ML IV SOLN
200.0000 mg | Freq: Once | INTRAVENOUS | Status: AC
  Administered 2018-06-29: 15:00:00 200 mg via INTRAVENOUS
  Filled 2018-06-29: qty 10

## 2018-06-29 MED ORDER — SODIUM CHLORIDE 0.9 % IV SOLN
Freq: Once | INTRAVENOUS | Status: AC
  Administered 2018-06-29: 14:00:00 via INTRAVENOUS
  Filled 2018-06-29: qty 250

## 2018-07-06 ENCOUNTER — Inpatient Hospital Stay: Payer: Medicare Other

## 2018-07-06 ENCOUNTER — Other Ambulatory Visit: Payer: Self-pay

## 2018-07-06 VITALS — BP 170/65 | HR 70 | Temp 97.5°F | Resp 18

## 2018-07-06 DIAGNOSIS — D509 Iron deficiency anemia, unspecified: Secondary | ICD-10-CM

## 2018-07-06 MED ORDER — SODIUM CHLORIDE 0.9 % IV SOLN: 200.0000 mg | Freq: Once | INTRAVENOUS | Status: DC

## 2018-07-06 MED ORDER — SODIUM CHLORIDE 0.9 % IV SOLN
Freq: Once | INTRAVENOUS | Status: AC
  Administered 2018-07-06: 15:00:00 via INTRAVENOUS
  Filled 2018-07-06: qty 250

## 2018-07-06 MED ORDER — IRON SUCROSE 20 MG/ML IV SOLN
200.0000 mg | Freq: Once | INTRAVENOUS | Status: AC
  Administered 2018-07-06: 15:00:00 200 mg via INTRAVENOUS

## 2018-07-06 NOTE — Patient Instructions (Signed)
Iron Sucrose injection What is this medicine? IRON SUCROSE (AHY ern SOO krohs) is an iron complex. Iron is used to make healthy red blood cells, which carry oxygen and nutrients throughout the body. This medicine is used to treat iron deficiency anemia in people with chronic kidney disease. This medicine may be used for other purposes; ask your health care provider or pharmacist if you have questions. COMMON BRAND NAME(S): Venofer What should I tell my health care provider before I take this medicine? They need to know if you have any of these conditions: -anemia not caused by low iron levels -heart disease -high levels of iron in the blood -kidney disease -liver disease -an unusual or allergic reaction to iron, other medicines, foods, dyes, or preservatives -pregnant or trying to get pregnant -breast-feeding How should I use this medicine? This medicine is for infusion into a vein. It is given by a health care professional in a hospital or clinic setting. Talk to your pediatrician regarding the use of this medicine in children. While this drug may be prescribed for children as young as 2 years for selected conditions, precautions do apply. Overdosage: If you think you have taken too much of this medicine contact a poison control center or emergency room at once. NOTE: This medicine is only for you. Do not share this medicine with others. What if I miss a dose? It is important not to miss your dose. Call your doctor or health care professional if you are unable to keep an appointment. What may interact with this medicine? Do not take this medicine with any of the following medications: -deferoxamine -dimercaprol -other iron products This medicine may also interact with the following medications: -chloramphenicol -deferasirox This list may not describe all possible interactions. Give your health care provider a list of all the medicines, herbs, non-prescription drugs, or dietary  supplements you use. Also tell them if you smoke, drink alcohol, or use illegal drugs. Some items may interact with your medicine. What should I watch for while using this medicine? Visit your doctor or healthcare professional regularly. Tell your doctor or healthcare professional if your symptoms do not start to get better or if they get worse. You may need blood work done while you are taking this medicine. You may need to follow a special diet. Talk to your doctor. Foods that contain iron include: whole grains/cereals, dried fruits, beans, or peas, leafy green vegetables, and organ meats (liver, kidney). What side effects may I notice from receiving this medicine? Side effects that you should report to your doctor or health care professional as soon as possible: -allergic reactions like skin rash, itching or hives, swelling of the face, lips, or tongue -breathing problems -changes in blood pressure -cough -fast, irregular heartbeat -feeling faint or lightheaded, falls -fever or chills -flushing, sweating, or hot feelings -joint or muscle aches/pains -seizures -swelling of the ankles or feet -unusually weak or tired Side effects that usually do not require medical attention (report to your doctor or health care professional if they continue or are bothersome): -diarrhea -feeling achy -headache -irritation at site where injected -nausea, vomiting -stomach upset -tiredness This list may not describe all possible side effects. Call your doctor for medical advice about side effects. You may report side effects to FDA at 1-800-FDA-1088. Where should I keep my medicine? This drug is given in a hospital or clinic and will not be stored at home. NOTE: This sheet is a summary. It may not cover all possible information. If   you have questions about this medicine, talk to your doctor, pharmacist, or health care provider.  2019 Elsevier/Gold Standard (2010-12-11 17:14:35)  

## 2018-08-30 ENCOUNTER — Other Ambulatory Visit: Payer: Self-pay

## 2018-08-30 ENCOUNTER — Inpatient Hospital Stay: Payer: Medicare Other | Attending: Hematology and Oncology

## 2018-08-30 DIAGNOSIS — E538 Deficiency of other specified B group vitamins: Secondary | ICD-10-CM | POA: Diagnosis not present

## 2018-08-30 DIAGNOSIS — Z833 Family history of diabetes mellitus: Secondary | ICD-10-CM | POA: Diagnosis not present

## 2018-08-30 DIAGNOSIS — Z79899 Other long term (current) drug therapy: Secondary | ICD-10-CM | POA: Insufficient documentation

## 2018-08-30 DIAGNOSIS — M069 Rheumatoid arthritis, unspecified: Secondary | ICD-10-CM | POA: Diagnosis not present

## 2018-08-30 DIAGNOSIS — Z841 Family history of disorders of kidney and ureter: Secondary | ICD-10-CM | POA: Diagnosis not present

## 2018-08-30 DIAGNOSIS — D509 Iron deficiency anemia, unspecified: Secondary | ICD-10-CM | POA: Diagnosis not present

## 2018-08-30 DIAGNOSIS — Z87891 Personal history of nicotine dependence: Secondary | ICD-10-CM | POA: Diagnosis not present

## 2018-08-30 LAB — CBC WITH DIFFERENTIAL/PLATELET
Abs Immature Granulocytes: 0.07 10*3/uL (ref 0.00–0.07)
Basophils Absolute: 0 10*3/uL (ref 0.0–0.1)
Basophils Relative: 0 %
Eosinophils Absolute: 0 10*3/uL (ref 0.0–0.5)
Eosinophils Relative: 0 %
HCT: 33.1 % — ABNORMAL LOW (ref 36.0–46.0)
Hemoglobin: 10.8 g/dL — ABNORMAL LOW (ref 12.0–15.0)
Immature Granulocytes: 1 %
Lymphocytes Relative: 11 %
Lymphs Abs: 1.4 10*3/uL (ref 0.7–4.0)
MCH: 29.2 pg (ref 26.0–34.0)
MCHC: 32.6 g/dL (ref 30.0–36.0)
MCV: 89.5 fL (ref 80.0–100.0)
Monocytes Absolute: 0.5 10*3/uL (ref 0.1–1.0)
Monocytes Relative: 4 %
Neutro Abs: 10.4 10*3/uL — ABNORMAL HIGH (ref 1.7–7.7)
Neutrophils Relative %: 84 %
Platelets: 392 10*3/uL (ref 150–400)
RBC: 3.7 MIL/uL — ABNORMAL LOW (ref 3.87–5.11)
RDW: 16.1 % — ABNORMAL HIGH (ref 11.5–15.5)
WBC: 12.4 10*3/uL — ABNORMAL HIGH (ref 4.0–10.5)
nRBC: 0 % (ref 0.0–0.2)

## 2018-08-30 LAB — FERRITIN: Ferritin: 35 ng/mL (ref 11–307)

## 2018-08-30 LAB — FOLATE: Folate: 12.8 ng/mL (ref >5.9)

## 2018-08-30 NOTE — Progress Notes (Signed)
[facility]  [address] [place], [place] [address] Phone: [phone]  Fax: [phone]   Clinic Day:  08/31/2018  Referring physician: Kirk Ruths, MD  Chief Complaint: Ashley Mack is a 73 y.o. female with iron deficiency anemia and B12 deficiency who is seen for 6 month assessment.  HPI: The patient was last seen in the hematology clinic on 03/03/2018. At that time, she was feeling better.  Exam was unremarkable.  Hemoglobin was 11.2.  Ferritin was 15.   She received Venofer x2 (03/03/2018 - 03/10/2018) and x3 (06/22/2018 - 07/06/2018).  She was seen in gastroenterology on 05/05/2018 by Stephens November, NP.  She had no symptoms of ischemic colitis.  She was followed clinically.  She was scheduled for an abdominal aortic ultrasound secondary to an abdominal bruit/history of tobacco abuse.  Imaging on 05/12/2018 revealed aortic atherosclerosis without aneurysm.  She was seen by her PCP, Dr. Frazier Richards, on 05/05/2018.   She receives B-12 injections from her chiropractor (latest 08/15/2018). She reports she feels good for a few days after the injection, and is then fatigued.  During the interim, she reports she is "tired." She is consistently fatigued and short of breath to minimal exertion. She denies any bleeding. She is eating meat.   She is sad that she cannot drive anymore, as she drove to many places around the country with her husband before he passed away.    Past Medical History:  Diagnosis Date  . Anemia   . Anxiety   . Arthritis    Rheumatoid  . [place] of gallbladder   . 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 Centrilobular emphysema ([place])   . Dyspnea   . Fatty liver   . GERD (gastroesophageal reflux disease)   . History of fractured vertebra    sees chiropractor  . 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 Iron (Fe) deficiency anemia   . Migraine headache    in past  .  Migraines   . Motion sickness   . 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 Osteoporosis   . Rheumatoid arthritis ([place])   . Vertigo     Past Surgical History:  Procedure Laterality Date  . CATARACT EXTRACTION W/PHACO Right 08/12/2016   Procedure: CATARACT EXTRACTION PHACO AND INTRAOCULAR LENS PLACEMENT ([place])  Right;  Surgeon: Leandrew Koyanagi, MD;  Location: [facility];  Service: Ophthalmology;  Laterality: Right;  . CATARACT EXTRACTION W/PHACO Left 09/30/2016   Procedure: CATARACT EXTRACTION PHACO AND INTRAOCULAR LENS PLACEMENT ([place]) Left;  Surgeon: Leandrew Koyanagi, MD;  Location: [facility];  Service: Ophthalmology;  Laterality: Left;  . COLONOSCOPY    . COLONOSCOPY WITH PROPOFOL N/A 12/09/2016   Procedure: COLONOSCOPY WITH PROPOFOL;  Surgeon: Toledo, Benay Pike, MD;  Location: [facility];  Service: Endoscopy;  Laterality: N/A;  . ESOPHAGOGASTRODUODENOSCOPY    . ESOPHAGOGASTRODUODENOSCOPY (EGD) WITH PROPOFOL N/A 12/09/2016   Procedure: ESOPHAGOGASTRODUODENOSCOPY (EGD) WITH PROPOFOL;  Surgeon: Toledo, Benay Pike, MD;  Location: [facility];  Service: Endoscopy;  Laterality: N/A;  . KYPHOPLASTY N/A 08/10/2017   Procedure: Hewitt Shorts;  Surgeon: Hessie Knows, MD;  Location: [facility];  Service: Orthopedics;  Laterality: N/A;  . KYPHOPLASTY N/A 09/09/2017   Procedure: MGQQPYPPJKD-T2,I7;  Surgeon: Hessie Knows, MD;  Location: [facility];  Service: Orthopedics;  Laterality: N/A;  . KYPHOPLASTY N/A 09/28/2017   Procedure: [id number];  Surgeon: Hessie Knows, MD;  Location: [facility];  Service: Orthopedics;  Laterality:  N/A;  . OOPHORECTOMY Bilateral 2003    Family History  Problem Relation Age of Onset  . Kidney disease Mother   . Diabetes Mellitus II Brother     Social History:  reports that she quit smoking about 11 years ago. Her smoking use included cigarettes. She smoked 1.00 pack per day. She has never used smokeless tobacco. She reports that she does not drink alcohol  or use drugs. She quit smoking in 2009.  She lives in [place]. Her step daughter is Museum/gallery conservator. The patient is alone today.  Allergies:  Allergies  Allergen Reactions  . Methotrexate Derivatives Nausea And Vomiting and Other (See Comments)    Flu like symptoms  . Other Nausea Only and Other (See Comments)    Arthritis medications . Unsure of what the med was.  . Ranitidine Hives  . Sulfasalazine Other (See Comments)    Stomach upset. Unsure of reaction. Long time ago  . Topiramate Nausea Only    Current Medications: Current Outpatient Medications  Medication Sig Dispense Refill  . budesonide-formoterol (SYMBICORT) 80-4.5 MCG/ACT inhaler Inhale 2 puffs into the lungs every morning.     . Cholecalciferol (VITAMIN D3) 2000 UNITS capsule Take 2,000 Units by mouth daily.     . Cyanocobalamin (B-12) 1000 MCG/ML KIT Inject 1 mL as directed every 30 (thirty) days. 3 kit 2  . DEXILANT 60 MG capsule Take 60 mg by mouth daily.     . predniSONE (DELTASONE) 5 MG tablet Take 5 mg by mouth daily.     . cyclobenzaprine (FLEXERIL) 10 MG tablet 1/2-1 po tid prn    . Fe Fum-FePoly-FA-Vit C-Vit B3 (INTEGRA F) 125-1 MG CAPS Integra 125 mg-40 mg-3 mg capsule    . Fe Fum-FePoly-Vit C-Vit B3 (INTEGRA) 62.5-62.5-40-3 MG CAPS Take 1 capsule by mouth daily.    . fluticasone (FLONASE) 50 MCG/ACT nasal spray Place 1-2 sprays into both nostrils daily as needed for allergies or rhinitis.     Marland Kitchen HYDROcodone-acetaminophen (NORCO) 5-325 MG tablet Take 1 tablet by mouth every 6 (six) hours as needed for moderate pain. (Patient not taking: Reported on 03/03/2018) 20 tablet 0  . naproxen sodium (ANAPROX) 220 MG tablet Take 220 mg by mouth daily as needed (for pain or headache).      No current facility-administered medications for this visit.     Review of Systems  Constitutional: Positive for malaise/fatigue. Negative for chills, diaphoresis, fever and weight loss (up 6lbs).       Feels "tired."   HENT: Negative.   Negative for congestion, hearing loss, sinus pain and sore throat.   Eyes: Negative.  Negative for blurred vision.  Respiratory: Positive for shortness of breath (exertional). Negative for cough and wheezing.   Cardiovascular: Negative.  Negative for chest pain, palpitations, orthopnea, leg swelling and PND.  Gastrointestinal: Negative.  Negative for abdominal pain, blood in stool, constipation, diarrhea, melena, nausea and vomiting.  Genitourinary: Negative.  Negative for dysuria, frequency, hematuria and urgency.  Musculoskeletal: Positive for back pain. Negative for joint pain and myalgias.       Patient s/p 5 kyphoplasties since 04/2017.  Skin: Negative.  Negative for rash.       Fragile skin.  Neurological: Negative.  Negative for dizziness, tingling, sensory change, weakness and headaches.  Endo/Heme/Allergies: Negative.  Does not bruise/bleed easily.  Psychiatric/Behavioral: Negative.  Negative for depression and memory loss. The patient is not nervous/anxious and does not have insomnia.   All other systems reviewed and are negative.  Performance status (ECOG): 1  Vitals: Blood pressure 127/87, pulse 99, temperature 97.9 F (36.6 C), temperature source Tympanic, resp. rate 18, height '5\' 2"'$  (1.575 m), weight 145 lb 8.1 oz (66 kg), SpO2 98 %.   Physical Exam  Constitutional: She is oriented to person, place, and time. She appears well-developed and well-nourished. No distress.  HENT:  Head: Normocephalic and atraumatic.  Mouth/Throat: Oropharynx is clear and moist. No oropharyngeal exudate.  Short brown hair. Mask.  Eyes: Pupils are equal, round, and reactive to light. Conjunctivae and EOM are normal. No scleral icterus.  Glasses.  Blue eyes.   Neck: Normal range of motion. Neck supple. No JVD present.  Cardiovascular: Normal rate, regular rhythm and normal heart sounds.  No murmur heard. Pulmonary/Chest: Effort normal and breath sounds normal. No respiratory distress. She has no  wheezes. She has no rales.  Abdominal: Soft. Bowel sounds are normal. She exhibits no distension and no mass. There is no abdominal tenderness. There is no rebound and no guarding.  Musculoskeletal: Normal range of motion.        General: No edema.  Lymphadenopathy:    She has no cervical adenopathy.    She has no axillary adenopathy.       Right: No supraclavicular adenopathy present.       Left: No supraclavicular adenopathy present.  Neurological: She is alert and oriented to person, place, and time.  Skin: Skin is warm and dry. No rash noted. She is not diaphoretic. No erythema. No pallor.  Psychiatric: She has a normal mood and affect. Her behavior is normal. Judgment and thought content normal.  Nursing note and vitals reviewed.   Appointment on 08/30/2018  Component Date Value Ref Range Status  . Folate 08/30/2018 12.8  >5.9 ng/mL Final   Performed at [facility], [address] [place], [place] [address]  . Ferritin 08/30/2018 35  11 - 307 ng/mL Final   Performed at [facility], [address] [place], [place] [address]  . WBC 08/30/2018 12.4* 4.0 - 10.5 K/uL Final  . RBC 08/30/2018 3.70* 3.87 - 5.11 MIL/uL Final  . Hemoglobin 08/30/2018 10.8* 12.0 - 15.0 g/dL Final  . HCT 08/30/2018 33.1* 36.0 - 46.0 % Final  . MCV 08/30/2018 89.5  80.0 - 100.0 fL Final  . MCH 08/30/2018 29.2  26.0 - 34.0 pg Final  . MCHC 08/30/2018 32.6  30.0 - 36.0 g/dL Final  . RDW 08/30/2018 16.1* 11.5 - 15.5 % Final  . Platelets 08/30/2018 392  150 - 400 K/uL Final  . nRBC 08/30/2018 0.0  0.0 - 0.2 % Final  . Neutrophils Relative % 08/30/2018 84  % Final  . Neutro Abs 08/30/2018 10.4* 1.7 - 7.7 K/uL Final  . Lymphocytes Relative 08/30/2018 11  % Final  . Lymphs Abs 08/30/2018 1.4  0.7 - 4.0 K/uL Final  . Monocytes Relative 08/30/2018 4  % Final  . Monocytes Absolute 08/30/2018 0.5  0.1 - 1.0 K/uL Final  . Eosinophils Relative 08/30/2018 0  % Final  . Eosinophils Absolute  08/30/2018 0.0  0.0 - 0.5 K/uL Final  . Basophils Relative 08/30/2018 0  % Final  . Basophils Absolute 08/30/2018 0.0  0.0 - 0.1 K/uL Final  . Immature Granulocytes 08/30/2018 1  % Final  . Abs Immature Granulocytes 08/30/2018 0.07  0.00 - 0.07 K/uL Final   Performed at [facility], [address] [place], [place] [address]    Assessment:  Ashley Mack  is a 73 y.o. female with iron deficiency anemia and B12 deficiency.  Diet is good.  She was on oral iron (Integra) for years.  She is unsure of any melena or hematochezia as she can not see well.    Work-up on 08/31/2016 revealed a hematocrit 30.6, hemoglobin 9.9, and MCV 83.2.  B12 was 90.  Ferritin was 13.  Sed rate was 45 (elevated).  Retic was 2.6%.  Normal studies included the following:  Coombs, folate, iron saturation, TIBC, SPEP, and free light chain ratio.  Normal studies on 07/29/2016 included: creatinine, calcium, albumin, protein, liver function tests, and TSH.  She had a colonoscopy on 03/18/2009. She has had several EGDs with the last on 10/12/2008.  She states that she has had polyps, reflux and a hiatal hernia. EGD on 12/09/2016 was normal, other than demonstrating a small hiatal hernia. Colonoscopy on 12/09/2016 demonstrated friability with contact bleeding in the proximal transverse colon. A 1 mm sessile polyp was found in the sigmoid colon, in addition to grade II non-bleeding internal hemorrhoids were also noted. Pathology results demonstrated a tubular adenoma that was negative for dyplasia or malignancy. 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   She began weekly B12 on 09/08/2016 and continued x 6 weeks (last 10/16/2016). She began monthly B12 on 11/26/2016 (last 02/17/2018).  Folate was 12.8 on 08/30/2018.  She received Venofer on 09/08/2016, 09/15/2016, 09/25/2016, 11/26/2016, 12/16/2016, 12/24/2016, x3 (05/27/2017 - 06/10/2017), x2 (03/03/2018 - 03/10/2018), and x3 (06/22/2018 - 07/06/2018).  She received  Venofer if her ferritin is < 30.  Ferritin has been followed: 16 on 11/26/2016, 34 on 03/01/2017, 15 on 05/26/2017, 59 on 07/08/2017, 56 on 08/25/2017, 15 on 02/28/2018, 17 on 06/21/2018, and 35 on 08/30/2018.   She has rheumatoid arthritis.  She has been treated with methotrexate, Imuran, leflunomide, and sulfasalazine.  She is on prednisone 5 mg a day x 2 years.  She is s/p kyphoplasty x 5 since 04/2017.  Symptomatically, she feels fatigued.  She is eating well.  She denies any bleeding.  Exam is stable.  Plan: 1.   Review labs from 08/30/2018. 2.  Iron deficiency             Hematocrit 33.1.  Hemoglobin 10.8.  MCV 89.5.              Ferritin 35.               Patient typically receives IV iron if ferritin< 30.  Because of patient's symptoms and borderline ferritin, discuss IV iron today and in 1 week 3.  B12 deficiency             Patient states she typically feels good after receiving B12.  She received B12 around the first of this month/last week.             B12 level prior to next dose (patient to call). 4.   RTC in 3 months for labs (CBC with diff, ferritin, TSH).   5.   RTC in 6 months for MD assessment, labs (CBC with diff, ferritin, folate-day before) and +/- Venofer.    I discussed the assessment and treatment plan with the patient.  The patient was provided an opportunity to ask questions and all were answered.  The patient agreed with the plan and demonstrated an understanding of the instructions.  The patient was advised to call back if the symptoms worsen or if the condition fails to improve as anticipated.  I provided 15 minutes of  face-to-face time during this this encounter and > 50% was spent counseling as documented under my assessment and plan.    Lequita Asal, MD, PhD    08/31/2018, 1:53 PM  I, Molly Dorshimer, am acting as Education administrator for [employer]. Mike Gip, MD, PhD.  I, Melissa C. Mike Gip, MD, have reviewed the above documentation for accuracy and  completeness, and I agree with the above.

## 2018-08-31 ENCOUNTER — Inpatient Hospital Stay (HOSPITAL_BASED_OUTPATIENT_CLINIC_OR_DEPARTMENT_OTHER): Payer: Medicare Other | Admitting: Hematology and Oncology

## 2018-08-31 ENCOUNTER — Encounter: Payer: Self-pay | Admitting: Hematology and Oncology

## 2018-08-31 ENCOUNTER — Inpatient Hospital Stay: Payer: Medicare Other

## 2018-08-31 VITALS — BP 120/80 | HR 85 | Temp 97.1°F | Resp 18

## 2018-08-31 VITALS — BP 127/87 | HR 99 | Temp 97.9°F | Resp 18 | Ht 62.0 in | Wt 145.5 lb

## 2018-08-31 DIAGNOSIS — D509 Iron deficiency anemia, unspecified: Secondary | ICD-10-CM | POA: Diagnosis not present

## 2018-08-31 DIAGNOSIS — E538 Deficiency of other specified B group vitamins: Secondary | ICD-10-CM | POA: Diagnosis not present

## 2018-08-31 MED ORDER — IRON SUCROSE 20 MG/ML IV SOLN
200.0000 mg | Freq: Once | INTRAVENOUS | Status: AC
  Administered 2018-08-31: 200 mg via INTRAVENOUS
  Filled 2018-08-31: qty 10

## 2018-08-31 MED ORDER — SODIUM CHLORIDE 0.9 % IV SOLN: 200.0000 mg | Freq: Once | INTRAVENOUS | Status: DC

## 2018-08-31 MED ORDER — SODIUM CHLORIDE 0.9 % IV SOLN
Freq: Once | INTRAVENOUS | Status: AC
  Administered 2018-08-31: 14:00:00 via INTRAVENOUS
  Filled 2018-08-31: qty 250

## 2018-08-31 NOTE — Patient Instructions (Signed)
Iron Sucrose injection What is this medicine? IRON SUCROSE (AHY ern SOO krohs) is an iron complex. Iron is used to make healthy red blood cells, which carry oxygen and nutrients throughout the body. This medicine is used to treat iron deficiency anemia in people with chronic kidney disease. This medicine may be used for other purposes; ask your health care provider or pharmacist if you have questions. COMMON BRAND NAME(S): Venofer What should I tell my health care provider before I take this medicine? They need to know if you have any of these conditions: -anemia not caused by low iron levels -heart disease -high levels of iron in the blood -kidney disease -liver disease -an unusual or allergic reaction to iron, other medicines, foods, dyes, or preservatives -pregnant or trying to get pregnant -breast-feeding How should I use this medicine? This medicine is for infusion into a vein. It is given by a health care professional in a hospital or clinic setting. Talk to your pediatrician regarding the use of this medicine in children. While this drug may be prescribed for children as young as 2 years for selected conditions, precautions do apply. Overdosage: If you think you have taken too much of this medicine contact a poison control center or emergency room at once. NOTE: This medicine is only for you. Do not share this medicine with others. What if I miss a dose? It is important not to miss your dose. Call your doctor or health care professional if you are unable to keep an appointment. What may interact with this medicine? Do not take this medicine with any of the following medications: -deferoxamine -dimercaprol -other iron products This medicine may also interact with the following medications: -chloramphenicol -deferasirox This list may not describe all possible interactions. Give your health care provider a list of all the medicines, herbs, non-prescription drugs, or dietary  supplements you use. Also tell them if you smoke, drink alcohol, or use illegal drugs. Some items may interact with your medicine. What should I watch for while using this medicine? Visit your doctor or healthcare professional regularly. Tell your doctor or healthcare professional if your symptoms do not start to get better or if they get worse. You may need blood work done while you are taking this medicine. You may need to follow a special diet. Talk to your doctor. Foods that contain iron include: whole grains/cereals, dried fruits, beans, or peas, leafy green vegetables, and organ meats (liver, kidney). What side effects may I notice from receiving this medicine? Side effects that you should report to your doctor or health care professional as soon as possible: -allergic reactions like skin rash, itching or hives, swelling of the face, lips, or tongue -breathing problems -changes in blood pressure -cough -fast, irregular heartbeat -feeling faint or lightheaded, falls -fever or chills -flushing, sweating, or hot feelings -joint or muscle aches/pains -seizures -swelling of the ankles or feet -unusually weak or tired Side effects that usually do not require medical attention (report to your doctor or health care professional if they continue or are bothersome): -diarrhea -feeling achy -headache -irritation at site where injected -nausea, vomiting -stomach upset -tiredness This list may not describe all possible side effects. Call your doctor for medical advice about side effects. You may report side effects to FDA at 1-800-FDA-1088. Where should I keep my medicine? This drug is given in a hospital or clinic and will not be stored at home. NOTE: This sheet is a summary. It may not cover all possible information. If   you have questions about this medicine, talk to your doctor, pharmacist, or health care provider.  2019 Elsevier/Gold Standard (2010-12-11 17:14:35)  

## 2018-08-31 NOTE — Progress Notes (Signed)
Increase SOB the report due her iron being low

## 2018-09-07 ENCOUNTER — Inpatient Hospital Stay (HOSPITAL_BASED_OUTPATIENT_CLINIC_OR_DEPARTMENT_OTHER): Payer: Medicare Other | Admitting: Hematology and Oncology

## 2018-09-07 ENCOUNTER — Other Ambulatory Visit: Payer: Self-pay

## 2018-09-07 ENCOUNTER — Inpatient Hospital Stay: Payer: Medicare Other

## 2018-09-07 VITALS — BP 128/79 | HR 81 | Temp 97.2°F | Resp 17

## 2018-09-07 DIAGNOSIS — E538 Deficiency of other specified B group vitamins: Secondary | ICD-10-CM

## 2018-09-07 DIAGNOSIS — D509 Iron deficiency anemia, unspecified: Secondary | ICD-10-CM | POA: Diagnosis not present

## 2018-09-07 LAB — VITAMIN B12: Vitamin B-12: 506 pg/mL (ref 180–914)

## 2018-09-07 MED ORDER — IRON SUCROSE 20 MG/ML IV SOLN
200.0000 mg | Freq: Once | INTRAVENOUS | Status: AC
  Administered 2018-09-07: 200 mg via INTRAVENOUS

## 2018-09-07 MED ORDER — SODIUM CHLORIDE 0.9 % IV SOLN: 200.0000 mg | Freq: Once | INTRAVENOUS | Status: DC

## 2018-09-07 MED ORDER — SODIUM CHLORIDE 0.9 % IV SOLN
Freq: Once | INTRAVENOUS | Status: AC
  Administered 2018-09-07: 14:00:00 via INTRAVENOUS
  Filled 2018-09-07: qty 250

## 2018-09-07 NOTE — Patient Instructions (Signed)
Iron Sucrose injection What is this medicine? IRON SUCROSE (AHY ern SOO krohs) is an iron complex. Iron is used to make healthy red blood cells, which carry oxygen and nutrients throughout the body. This medicine is used to treat iron deficiency anemia in people with chronic kidney disease. This medicine may be used for other purposes; ask your health care provider or pharmacist if you have questions. COMMON BRAND NAME(S): Venofer What should I tell my health care provider before I take this medicine? They need to know if you have any of these conditions: -anemia not caused by low iron levels -heart disease -high levels of iron in the blood -kidney disease -liver disease -an unusual or allergic reaction to iron, other medicines, foods, dyes, or preservatives -pregnant or trying to get pregnant -breast-feeding How should I use this medicine? This medicine is for infusion into a vein. It is given by a health care professional in a hospital or clinic setting. Talk to your pediatrician regarding the use of this medicine in children. While this drug may be prescribed for children as young as 2 years for selected conditions, precautions do apply. Overdosage: If you think you have taken too much of this medicine contact a poison control center or emergency room at once. NOTE: This medicine is only for you. Do not share this medicine with others. What if I miss a dose? It is important not to miss your dose. Call your doctor or health care professional if you are unable to keep an appointment. What may interact with this medicine? Do not take this medicine with any of the following medications: -deferoxamine -dimercaprol -other iron products This medicine may also interact with the following medications: -chloramphenicol -deferasirox This list may not describe all possible interactions. Give your health care provider a list of all the medicines, herbs, non-prescription drugs, or dietary  supplements you use. Also tell them if you smoke, drink alcohol, or use illegal drugs. Some items may interact with your medicine. What should I watch for while using this medicine? Visit your doctor or healthcare professional regularly. Tell your doctor or healthcare professional if your symptoms do not start to get better or if they get worse. You may need blood work done while you are taking this medicine. You may need to follow a special diet. Talk to your doctor. Foods that contain iron include: whole grains/cereals, dried fruits, beans, or peas, leafy green vegetables, and organ meats (liver, kidney). What side effects may I notice from receiving this medicine? Side effects that you should report to your doctor or health care professional as soon as possible: -allergic reactions like skin rash, itching or hives, swelling of the face, lips, or tongue -breathing problems -changes in blood pressure -cough -fast, irregular heartbeat -feeling faint or lightheaded, falls -fever or chills -flushing, sweating, or hot feelings -joint or muscle aches/pains -seizures -swelling of the ankles or feet -unusually weak or tired Side effects that usually do not require medical attention (report to your doctor or health care professional if they continue or are bothersome): -diarrhea -feeling achy -headache -irritation at site where injected -nausea, vomiting -stomach upset -tiredness This list may not describe all possible side effects. Call your doctor for medical advice about side effects. You may report side effects to FDA at 1-800-FDA-1088. Where should I keep my medicine? This drug is given in a hospital or clinic and will not be stored at home. NOTE: This sheet is a summary. It may not cover all possible information. If   you have questions about this medicine, talk to your doctor, pharmacist, or health care provider.  2019 Elsevier/Gold Standard (2010-12-11 17:14:35)  

## 2018-09-15 ENCOUNTER — Telehealth: Payer: Self-pay

## 2018-09-15 NOTE — Telephone Encounter (Signed)
Returned patient's call regarding B12 levels. Informed her of results. Patient verbalizes understanding and denies any further questions or concerns.

## 2018-09-15 NOTE — Telephone Encounter (Signed)
-----   Message from Secundino Ginger sent at 09/15/2018  2:03 PM EDT ----- Regarding: lab results Contact: [phone] Pt left a msg that she wanted someone to call her with her lab results from last week

## 2018-10-13 ENCOUNTER — Encounter: Payer: Self-pay | Admitting: Hematology and Oncology

## 2018-11-30 ENCOUNTER — Other Ambulatory Visit: Payer: Self-pay

## 2018-12-01 ENCOUNTER — Inpatient Hospital Stay: Payer: Medicare Other | Attending: Hematology and Oncology

## 2018-12-01 ENCOUNTER — Other Ambulatory Visit: Payer: Self-pay

## 2018-12-01 DIAGNOSIS — Z79899 Other long term (current) drug therapy: Secondary | ICD-10-CM | POA: Insufficient documentation

## 2018-12-01 DIAGNOSIS — D509 Iron deficiency anemia, unspecified: Secondary | ICD-10-CM

## 2018-12-01 DIAGNOSIS — E538 Deficiency of other specified B group vitamins: Secondary | ICD-10-CM

## 2018-12-01 DIAGNOSIS — R5383 Other fatigue: Secondary | ICD-10-CM

## 2018-12-01 LAB — CBC WITH DIFFERENTIAL/PLATELET
Abs Immature Granulocytes: 0.09 10*3/uL — ABNORMAL HIGH (ref 0.00–0.07)
Basophils Absolute: 0.1 10*3/uL (ref 0.0–0.1)
Basophils Relative: 1 %
Eosinophils Absolute: 0.1 10*3/uL (ref 0.0–0.5)
Eosinophils Relative: 1 %
HCT: 34.7 % — ABNORMAL LOW (ref 36.0–46.0)
Hemoglobin: 11.2 g/dL — ABNORMAL LOW (ref 12.0–15.0)
Immature Granulocytes: 1 %
Lymphocytes Relative: 13 %
Lymphs Abs: 1.7 10*3/uL (ref 0.7–4.0)
MCH: 28.5 pg (ref 26.0–34.0)
MCHC: 32.3 g/dL (ref 30.0–36.0)
MCV: 88.3 fL (ref 80.0–100.0)
Monocytes Absolute: 1 10*3/uL (ref 0.1–1.0)
Monocytes Relative: 7 %
Neutro Abs: 10.7 10*3/uL — ABNORMAL HIGH (ref 1.7–7.7)
Neutrophils Relative %: 77 %
Platelets: 420 10*3/uL — ABNORMAL HIGH (ref 150–400)
RBC: 3.93 MIL/uL (ref 3.87–5.11)
RDW: 15.5 % (ref 11.5–15.5)
WBC: 13.7 10*3/uL — ABNORMAL HIGH (ref 4.0–10.5)
nRBC: 0 % (ref 0.0–0.2)

## 2018-12-01 LAB — TSH: TSH: 4.603 u[IU]/mL — ABNORMAL HIGH (ref 0.350–4.500)

## 2018-12-01 LAB — T4, FREE: Free T4: 0.9 ng/dL (ref 0.61–1.12)

## 2018-12-01 LAB — FERRITIN: Ferritin: 32 ng/mL (ref 11–307)

## 2019-02-28 ENCOUNTER — Other Ambulatory Visit: Payer: Self-pay

## 2019-03-01 ENCOUNTER — Inpatient Hospital Stay: Payer: Medicare Other | Attending: Oncology

## 2019-03-01 ENCOUNTER — Other Ambulatory Visit: Payer: Medicare Other

## 2019-03-01 DIAGNOSIS — Z7952 Long term (current) use of systemic steroids: Secondary | ICD-10-CM | POA: Insufficient documentation

## 2019-03-01 DIAGNOSIS — M069 Rheumatoid arthritis, unspecified: Secondary | ICD-10-CM | POA: Insufficient documentation

## 2019-03-01 DIAGNOSIS — R0602 Shortness of breath: Secondary | ICD-10-CM | POA: Insufficient documentation

## 2019-03-01 DIAGNOSIS — Z79899 Other long term (current) drug therapy: Secondary | ICD-10-CM | POA: Insufficient documentation

## 2019-03-01 DIAGNOSIS — D509 Iron deficiency anemia, unspecified: Secondary | ICD-10-CM

## 2019-03-01 DIAGNOSIS — Z7951 Long term (current) use of inhaled steroids: Secondary | ICD-10-CM | POA: Diagnosis not present

## 2019-03-01 DIAGNOSIS — R5383 Other fatigue: Secondary | ICD-10-CM | POA: Insufficient documentation

## 2019-03-01 DIAGNOSIS — K219 Gastro-esophageal reflux disease without esophagitis: Secondary | ICD-10-CM | POA: Diagnosis not present

## 2019-03-01 DIAGNOSIS — E538 Deficiency of other specified B group vitamins: Secondary | ICD-10-CM

## 2019-03-01 DIAGNOSIS — Z87891 Personal history of nicotine dependence: Secondary | ICD-10-CM | POA: Insufficient documentation

## 2019-03-01 LAB — CBC WITH DIFFERENTIAL/PLATELET
Abs Immature Granulocytes: 0.13 10*3/uL — ABNORMAL HIGH (ref 0.00–0.07)
Basophils Absolute: 0.1 10*3/uL (ref 0.0–0.1)
Basophils Relative: 1 %
Eosinophils Absolute: 0 10*3/uL (ref 0.0–0.5)
Eosinophils Relative: 0 %
HCT: 32.1 % — ABNORMAL LOW (ref 36.0–46.0)
Hemoglobin: 10.2 g/dL — ABNORMAL LOW (ref 12.0–15.0)
Immature Granulocytes: 1 %
Lymphocytes Relative: 12 %
Lymphs Abs: 1.6 10*3/uL (ref 0.7–4.0)
MCH: 28.7 pg (ref 26.0–34.0)
MCHC: 31.8 g/dL (ref 30.0–36.0)
MCV: 90.4 fL (ref 80.0–100.0)
Monocytes Absolute: 0.6 10*3/uL (ref 0.1–1.0)
Monocytes Relative: 5 %
Neutro Abs: 10.6 10*3/uL — ABNORMAL HIGH (ref 1.7–7.7)
Neutrophils Relative %: 81 %
Platelets: 446 10*3/uL — ABNORMAL HIGH (ref 150–400)
RBC: 3.55 MIL/uL — ABNORMAL LOW (ref 3.87–5.11)
RDW: 16.6 % — ABNORMAL HIGH (ref 11.5–15.5)
WBC: 13 10*3/uL — ABNORMAL HIGH (ref 4.0–10.5)
nRBC: 0 % (ref 0.0–0.2)

## 2019-03-01 LAB — FOLATE: Folate: 9.5 ng/mL (ref >5.9)

## 2019-03-01 LAB — FERRITIN: Ferritin: 20 ng/mL (ref 11–307)

## 2019-03-02 ENCOUNTER — Inpatient Hospital Stay (HOSPITAL_BASED_OUTPATIENT_CLINIC_OR_DEPARTMENT_OTHER): Payer: Medicare Other | Admitting: Nurse Practitioner

## 2019-03-02 ENCOUNTER — Inpatient Hospital Stay: Payer: Medicare Other

## 2019-03-02 ENCOUNTER — Other Ambulatory Visit: Payer: Self-pay

## 2019-03-02 VITALS — BP 146/88 | HR 104 | Temp 98.6°F | Resp 18 | Wt 156.3 lb

## 2019-03-02 DIAGNOSIS — E538 Deficiency of other specified B group vitamins: Secondary | ICD-10-CM

## 2019-03-02 DIAGNOSIS — D509 Iron deficiency anemia, unspecified: Secondary | ICD-10-CM

## 2019-03-02 MED ORDER — SODIUM CHLORIDE 0.9 % IV SOLN: 200.0000 mg | Freq: Once | INTRAVENOUS | Status: DC

## 2019-03-02 MED ORDER — SODIUM CHLORIDE 0.9 % IV SOLN
Freq: Once | INTRAVENOUS | Status: AC
  Filled 2019-03-02: qty 250

## 2019-03-02 MED ORDER — IRON SUCROSE 20 MG/ML IV SOLN
200.0000 mg | Freq: Once | INTRAVENOUS | Status: AC
  Administered 2019-03-02: 200 mg via INTRAVENOUS
  Filled 2019-03-02: qty 10

## 2019-03-02 NOTE — Progress Notes (Signed)
[facility]  [address], [address] [place], [place] [address] Phone: [phone]  Fax: [phone]   Clinic Day:  03/02/2019  Referring physician: Kirk Ruths, MD  Chief Complaint: Ashley Mack is a 73 y.o. female diagnosed with iron deficiency anemia and B12 deficiency who returns to clinic for reevaluation  HPI: She was last seen in clinic by Dr. Mike Gip on 08/31/2018. She received IV Venofer x 2 on 08/31/2018 and 09/07/2018.  Her last B12 level (09/07/2018) was 506.  She receives B12 injections from her chiropractor and says she feels better for a few days after the injection and then fatigued.   Today, she continues to report constant fatigue.  Intermittent shortness of breath.  Denies any bleeding or abnormal bruising.  Denies any recent colitis symptoms.  No pica-like symptoms.  Denies black or bloody stools.  Denies hemoptysis.  She previously took oral iron but had poor absorption and tolerability.  She says she thinks that she may need iron infusions again today.  Past medical, surgical, family history reviewed and updated as below.  Medications and allergies reviewed and updated as below.   Past Medical History:  Diagnosis Date  . Anemia   . Anxiety   . Arthritis    Rheumatoid  . [place] of gallbladder   . 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 Centrilobular emphysema ([place])   . Dyspnea   . Fatty liver   . GERD (gastroesophageal reflux disease)   . History of fractured vertebra    sees chiropractor  . 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 Iron (Fe) deficiency anemia   . Migraine headache    in past  . Migraines   . Motion sickness   . 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 Osteoporosis   . Rheumatoid arthritis ([place])   . Vertigo     Past Surgical History:  Procedure Laterality Date  . CATARACT EXTRACTION W/PHACO Right 08/12/2016   Procedure: CATARACT EXTRACTION PHACO AND INTRAOCULAR LENS PLACEMENT ([place])  Right;   Surgeon: Leandrew Koyanagi, MD;  Location: [facility];  Service: Ophthalmology;  Laterality: Right;  . CATARACT EXTRACTION W/PHACO Left 09/30/2016   Procedure: CATARACT EXTRACTION PHACO AND INTRAOCULAR LENS PLACEMENT ([place]) Left;  Surgeon: Leandrew Koyanagi, MD;  Location: [facility];  Service: Ophthalmology;  Laterality: Left;  . COLONOSCOPY    . COLONOSCOPY WITH PROPOFOL N/A 12/09/2016   Procedure: COLONOSCOPY WITH PROPOFOL;  Surgeon: Toledo, Benay Pike, MD;  Location: [facility];  Service: Endoscopy;  Laterality: N/A;  . ESOPHAGOGASTRODUODENOSCOPY    . ESOPHAGOGASTRODUODENOSCOPY (EGD) WITH PROPOFOL N/A 12/09/2016   Procedure: ESOPHAGOGASTRODUODENOSCOPY (EGD) WITH PROPOFOL;  Surgeon: Toledo, Benay Pike, MD;  Location: [facility];  Service: Endoscopy;  Laterality: N/A;  . KYPHOPLASTY N/A 08/10/2017   Procedure: Hewitt Shorts;  Surgeon: Hessie Knows, MD;  Location: [facility];  Service: Orthopedics;  Laterality: N/A;  . KYPHOPLASTY N/A 09/09/2017   Procedure: [id number];  Surgeon: Hessie Knows, MD;  Location: [facility];  Service: Orthopedics;  Laterality: N/A;  . KYPHOPLASTY N/A 09/28/2017   Procedure: [id number];  Surgeon: Hessie Knows, MD;  Location: [facility];  Service: Orthopedics;  Laterality: N/A;  . OOPHORECTOMY Bilateral 2003    Family History  Problem Relation Age of Onset  . Kidney disease Mother   . Diabetes Mellitus II Brother     Social History:  reports that she quit smoking about 11 years ago. Her smoking use included cigarettes. She smoked  1.00 pack per day. She has never used smokeless tobacco. She reports that she does not drink alcohol or use drugs. She quit smoking in 2009.  She lives in [place]. Her step daughter is Museum/gallery conservator.  Allergies:  Allergies  Allergen Reactions  . Methotrexate Derivatives Nausea And Vomiting and Other (See Comments)    Flu like symptoms  . Other Nausea Only and Other (See Comments)    Arthritis medications  . Unsure of what the med was.  . Ranitidine Hives  . Sulfasalazine Other (See Comments)    Stomach upset. Unsure of reaction. Long time ago  . Topiramate Nausea Only    Current Medications: Current Outpatient Medications  Medication Sig Dispense Refill  . budesonide-formoterol (SYMBICORT) 80-4.5 MCG/ACT inhaler Inhale 2 puffs into the lungs every morning.     . Cholecalciferol (VITAMIN D3) 2000 UNITS capsule Take 2,000 Units by mouth daily.     . cyanocobalamin (,VITAMIN B-12,) 1000 MCG/ML injection     . cyclobenzaprine (FLEXERIL) 10 MG tablet 1/2-1 po tid prn    . DEXILANT 60 MG capsule Take 60 mg by mouth daily.     . Fe Fum-FePoly-FA-Vit C-Vit B3 (INTEGRA F) 125-1 MG CAPS Integra 125 mg-40 mg-3 mg capsule    . Fe Fum-FePoly-Vit C-Vit B3 (INTEGRA) 62.5-62.5-40-3 MG CAPS Take 1 capsule by mouth daily.    . fluticasone (FLONASE) 50 MCG/ACT nasal spray Place 1-2 sprays into both nostrils daily as needed for allergies or rhinitis.     Marland Kitchen HYDROcodone-acetaminophen (NORCO) 5-325 MG tablet Take 1 tablet by mouth every 6 (six) hours as needed for moderate pain. (Patient not taking: Reported on 03/03/2018) 20 tablet 0  . naproxen sodium (ANAPROX) 220 MG tablet Take 220 mg by mouth daily as needed (for pain or headache).     . polyethylene glycol powder (GLYCOLAX/MIRALAX) 17 GM/SCOOP powder polyethylene glycol 3350 17 gram/dose oral powder    . predniSONE (DELTASONE) 5 MG tablet Take 5 mg by mouth daily.      No current facility-administered medications for this visit.    Review of Systems  Constitutional: Positive for malaise/fatigue. Negative for chills, diaphoresis, fever and weight loss (up 6lbs).       Unaccompanied. Wearing mask.   HENT: Negative.  Negative for congestion, hearing loss, sinus pain and sore throat.   Eyes: Negative.  Negative for blurred vision.  Respiratory: Positive for shortness of breath (exertional). Negative for cough and wheezing.   Cardiovascular: Negative.   Negative for chest pain, palpitations, orthopnea, leg swelling and PND.  Gastrointestinal: Negative.  Negative for abdominal pain, blood in stool, constipation, diarrhea, melena, nausea and vomiting.  Genitourinary: Negative.  Negative for dysuria, frequency, hematuria and urgency.  Musculoskeletal: Positive for back pain (chronic s/p kyphoplasties ). Negative for joint pain and myalgias.  Skin: Negative.  Negative for rash.  Neurological: Negative.  Negative for dizziness, tingling, sensory change, weakness and headaches.  Endo/Heme/Allergies: Negative.  Does not bruise/bleed easily.  Psychiatric/Behavioral: Negative.  Negative for depression and memory loss. The patient is not nervous/anxious and does not have insomnia.   All other systems reviewed and are negative.  Performance status (ECOG): 1  Vitals: Today's Vitals   03/02/19 1010  BP: (!) 146/88  Pulse: (!) 104  Resp: 18  Temp: 98.6 F (37 C)  TempSrc: Tympanic  SpO2: 100%  Weight: 156 lb 4.8 oz (70.9 kg)  PainSc: 0-No pain   Body mass index is 28.59 kg/m.   Physical Exam  Constitutional: She is  oriented to person, place, and time. She appears well-developed and well-nourished. No distress.  Unaccompanied. Wearing mask.   HENT:  Head: Normocephalic and atraumatic.  Mouth/Throat: Oropharynx is clear and moist. No oropharyngeal exudate.  Short brown hair. Mask.  Eyes: Pupils are equal, round, and reactive to light. Conjunctivae and EOM are normal. No scleral icterus.  Glasses.  Blue eyes.   Neck: No JVD present.  Cardiovascular: Normal rate, regular rhythm and normal heart sounds.  No murmur heard. Pulmonary/Chest: Effort normal and breath sounds normal. No respiratory distress. She has no wheezes. She has no rales.  Abdominal: Soft. Bowel sounds are normal. She exhibits no distension and no mass. There is no abdominal tenderness. There is no rebound and no guarding.  Musculoskeletal:        General: No edema. Normal  range of motion.     Cervical back: Normal range of motion and neck supple.  Lymphadenopathy:    She has no cervical adenopathy.    She has no axillary adenopathy.       Right: No supraclavicular adenopathy present.       Left: No supraclavicular adenopathy present.  Neurological: She is alert and oriented to person, place, and time.  Skin: Skin is warm and dry. No rash noted. She is not diaphoretic. No erythema. No pallor.  Psychiatric: She has a normal mood and affect. Her behavior is normal. Judgment and thought content normal.  Nursing note and vitals reviewed.   Appointment on 03/01/2019  Component Date Value Ref Range Status  . Folate 03/01/2019 9.5  >5.9 ng/mL Final   Performed at [facility], [address] [place], [place] [address]  . Ferritin 03/01/2019 20  11 - 307 ng/mL Final   Performed at [facility], [address] [place], [place] [address]  . WBC 03/01/2019 13.0* 4.0 - 10.5 K/uL Final  . RBC 03/01/2019 3.55* 3.87 - 5.11 MIL/uL Final  . Hemoglobin 03/01/2019 10.2* 12.0 - 15.0 g/dL Final  . HCT 03/01/2019 32.1* 36.0 - 46.0 % Final  . MCV 03/01/2019 90.4  80.0 - 100.0 fL Final  . MCH 03/01/2019 28.7  26.0 - 34.0 pg Final  . MCHC 03/01/2019 31.8  30.0 - 36.0 g/dL Final  . RDW 03/01/2019 16.6* 11.5 - 15.5 % Final  . Platelets 03/01/2019 446* 150 - 400 K/uL Final  . nRBC 03/01/2019 0.0  0.0 - 0.2 % Final  . Neutrophils Relative % 03/01/2019 81  % Final  . Neutro Abs 03/01/2019 10.6* 1.7 - 7.7 K/uL Final  . Lymphocytes Relative 03/01/2019 12  % Final  . Lymphs Abs 03/01/2019 1.6  0.7 - 4.0 K/uL Final  . Monocytes Relative 03/01/2019 5  % Final  . Monocytes Absolute 03/01/2019 0.6  0.1 - 1.0 K/uL Final  . Eosinophils Relative 03/01/2019 0  % Final  . Eosinophils Absolute 03/01/2019 0.0  0.0 - 0.5 K/uL Final  . Basophils Relative 03/01/2019 1  % Final  . Basophils Absolute 03/01/2019 0.1  0.0 - 0.1 K/uL Final  . Immature Granulocytes 03/01/2019  1  % Final  . Abs Immature Granulocytes 03/01/2019 0.13* 0.00 - 0.07 K/uL Final   Performed at [facility], [address], [place], [place] [address]    Assessment:  PERLE FISHMAN is a 73 y.o. female with iron deficiency anemia and B12 deficiency who returns to clinic for 27-month evaluation.  Diet is good.  Previously on oral iron (Integra) for years but had persistent iron  deficiency anemia despite oral supplementation.  Currently denies melena or hematochezia  Previously, work-up on 08/31/2016 revealed hematocrit of 30.6, hemoglobin 9.9, MCV 80 3.2.  B12 was 90.  Ferritin was 13.  Sed rate was 45 (elevated).  Recheck was 2.6%.  Coombs, folate, iron saturation, TIBC, SPEP, and the free light chain ratio were normal.  Creatinine, calcium, albumin, protein, LFTs, and TSH were normal on 07/29/2016.  She had a colonoscopy on 03/18/2009 and again on 12/09/2016.  Polyps were removed which were consistent with tubular adenoma and negative for high-grade dysplasia or malignancy.  Colon was friable and was biopsied.  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 She has had several EGDs.  Last on 12/09/2016 and reportedly normal other than demonstrating a small hiatal hernia.  Follow-up was recommended 3 months after colonoscopy but patient did not follow-up.  VCE was declined.  She is now followed by Dr. Gustavo Lah & Stana Bunting, NP.   She began weekly B12 on 09/08/2016 and continued x 6 weeks.  Last on 10/16/2016.  She then began monthly B12 injections and now receives them with her chiropractor.   Folate was 12.8 on 08/30/2018.   She has received Venofer on 09/08/2016, 09/15/2016, 09/25/2016, 11/26/2016, 12/16/2016, 12/24/2016, x3 (05/27/2017 - 06/10/2017), x2 (03/03/2018 - 03/10/2018), x3 (06/22/2018 - 07/06/2018), and x 2 on 08/31/2018, 09/07/2018.  She has been receiving Venofer if ferritin is < 30.   Ferritin has been followed:  16 on 11/26/2016, 34 on 03/01/2017, 15 on 05/26/2017, 59 on 07/08/2017, 56 on 08/25/2017, 15 on 02/28/2018, 17 on 06/21/2018, 35 on 08/30/2018, 32 on 12/01/2018, and 20 on 03/01/2019.   She has rheumatoid arthritis.  She has been treated with methotrexate, Imuran, leflunomide, and sulfasalazine.  She is on prednisone 5 mg a day x 2 years.  She is s/p kyphoplasty x 5 since 04/2017.  Symptomatically, patient feels fatigued and mild shortness of breath.  She suspects that her iron is low based on history of the symptoms.  No frank GI bleeding.  Has declined additional GI work-up but is followed closely by GI.  Exam today is stable and unremarkable.  Plan: 1.   Labs from 03/01/2019 including CBC, ferritin, and folate reviewed and discussed with patient. 2.  Iron deficiency anemia             S/p venofer and has tolerated well.   Hematocrit 32.1 (decreased), hemoglobin 10.2 (decreased), MCV 90.4 (normal)  Ferritin 20 - normal but low  Patient typically receives IV iron if ferritin < 30.   She is symptomatic and borderline ferritin.   Recommend IV iron/Venofer x 3   3.  B12 deficiency             Currently receives monthly injections with chiropractor.   Symptomatically improved with supplementation  No macrocytosis on labs. Folate normal.   Will plan to check b12 level at next visit to follow   Disposition Iron today and repeat x 2 for total of 3.  rtc in 3 months for labs (cbc, cmp, ferritin, tibc, folate, b12), MD day later and +/- venofer  I discussed the assessment and treatment plan with the patient.  The patient was provided an opportunity to ask questions and all were answered.  The patient agreed with the plan and demonstrated an understanding of the instructions.  The patient was advised to call back if the symptoms worsen or if the condition fails to improve as anticipated.  I  provided 15 minutes of face-to-face time during this this encounter and > 50% was spent counseling as  documented under my assessment and plan.   Ashley Rutter, DNP, AGNP-C [place] at [facility] [phone] (clinic)  CC: Dr. Mike Gip

## 2019-03-02 NOTE — Patient Instructions (Signed)

## 2019-03-09 ENCOUNTER — Ambulatory Visit
Admission: EM | Admit: 2019-03-09 | Discharge: 2019-03-09 | Disposition: A | Discharge status: Home / Self Care | Payer: Medicare Other | Attending: Urgent Care | Admitting: Urgent Care

## 2019-03-09 ENCOUNTER — Inpatient Hospital Stay: Payer: Medicare Other

## 2019-03-09 ENCOUNTER — Other Ambulatory Visit: Payer: Self-pay

## 2019-03-09 VITALS — BP 143/79 | HR 98 | Temp 97.7°F

## 2019-03-09 DIAGNOSIS — D509 Iron deficiency anemia, unspecified: Secondary | ICD-10-CM | POA: Diagnosis not present

## 2019-03-09 DIAGNOSIS — H6690 Otitis media, unspecified, unspecified ear: Secondary | ICD-10-CM

## 2019-03-09 DIAGNOSIS — E538 Deficiency of other specified B group vitamins: Secondary | ICD-10-CM

## 2019-03-09 DIAGNOSIS — J0100 Acute maxillary sinusitis, unspecified: Secondary | ICD-10-CM

## 2019-03-09 MED ORDER — IRON SUCROSE 20 MG/ML IV SOLN
200.0000 mg | Freq: Once | INTRAVENOUS | Status: AC
  Administered 2019-03-09: 200 mg via INTRAVENOUS

## 2019-03-09 MED ORDER — AMOXICILLIN-POT CLAVULANATE 875-125 MG PO TABS: 1.0000 | ORAL_TABLET | Freq: Two times a day (BID) | ORAL | 0 refills | Status: AC

## 2019-03-09 MED ORDER — SODIUM CHLORIDE 0.9 % IV SOLN: 200.0000 mg | Freq: Once | INTRAVENOUS | Status: DC

## 2019-03-09 MED ORDER — SODIUM CHLORIDE 0.9 % IV SOLN
Freq: Once | INTRAVENOUS | Status: AC
  Filled 2019-03-09: qty 250

## 2019-03-09 NOTE — ED Triage Notes (Signed)
Pt scheduled for iron infusion this morning at [facility]. Pt with sinus drainage down right side of throat. "I get a sinus infection about twice a year."

## 2019-03-09 NOTE — Discharge Instructions (Signed)
It was very nice seeing you today in clinic. Thank you for entrusting me with your care.   Rest and ensure adequate hydration. Please utilize the medications that we discussed. Your prescriptions has been called in to your pharmacy. May use Tylenol and/or Ibuprofen as needed for pain/fever.   Make arrangements to follow up with your regular doctor in 1 week for re-evaluation if not improving. If your symptoms/condition worsens, please seek follow up care either here or in the ER. Please remember, our [place] providers are "right here with you" when you need [place].   Again, it was my pleasure to take care of you today. Thank you for choosing our clinic. I hope that you start to feel better quickly.   Honor Loh, MSN, APRN, FNP-C, CEN Advanced Practice Provider [facility]

## 2019-03-09 NOTE — Patient Instructions (Signed)

## 2019-03-09 NOTE — ED Provider Notes (Signed)
[place], [place]   Name: Ashley Mack DOB: 1945/07/26 MRN: [id number] CSN: [id number] PCP: Kirk Ruths, MD  Arrival date and time:  03/09/19 [id number]  Chief Complaint:  Sinus Problem   NOTE: Prior to seeing the patient today, I have reviewed the triage nursing documentation and vital signs. Clinical staff has updated patient's PMH/PSHx, current medication list, and drug allergies/intolerances to ensure comprehensive history available to assist in medical decision making.   History:   HPI: Ashley Mack is a 73 y.o. female who presents today with complaints of paranasal sinus tenderness, sore throat, PND, and some referred pain in the RIGHT ear that started over the course of the last few days. Patient denies fevers. Patient has a chronic cough and shortness of related to her known PMH of centrrilobar emphysema and history of smoking. She has known IDA which also contributes to her shortness of breath; receives Venofer infusions at the cancer center (next infusion today). Cough is non-productive and not reported to be any worse as compared to her baseline. Patient has a history of recurrent sinus infections and notes that today's symptoms feel similar to previous infections. She denies that she has experienced any nausea, vomiting, diarrhea, or abdominal pain. She is eating and drinking well. Patient denies any perceived alterations to her sense of taste or smell. Patient denies being in close contact with anyone known to be ill; no one else is her home has experienced a similar symptom constellation. She has never been tested for SARS-CoV-2 (novel coronavirus) in the past per her report and does not wish to be tested today. Patient has been vaccinated for influenza this season. In efforts to conservatively manage her symptoms at home, the patient notes that she has used saline nasal spray and fluticasone, which has not helped to improve her symptoms. She is on maintenance steroid (prednisone 5 mg  daily) therapy.   Past Medical History:  Diagnosis Date  . Anemia   . Anxiety   . Arthritis    Rheumatoid  . [place] of gallbladder   . 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 Centrilobular emphysema ([place])   . Dyspnea   . Fatty liver   . GERD (gastroesophageal reflux disease)   . History of fractured vertebra    sees chiropractor  . 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 Iron (Fe) deficiency anemia   . Migraine headache    in past  . Migraines   . Motion sickness   . 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 Osteoporosis   . Rheumatoid arthritis ([place])   . Vertigo     Past Surgical History:  Procedure Laterality Date  . CATARACT EXTRACTION W/PHACO Right 08/12/2016   Procedure: CATARACT EXTRACTION PHACO AND INTRAOCULAR LENS PLACEMENT ([place])  Right;  Surgeon: Leandrew Koyanagi, MD;  Location: [facility];  Service: Ophthalmology;  Laterality: Right;  . CATARACT EXTRACTION W/PHACO Left 09/30/2016   Procedure: CATARACT EXTRACTION PHACO AND INTRAOCULAR LENS PLACEMENT ([place]) Left;  Surgeon: Leandrew Koyanagi, MD;  Location: [facility];  Service: Ophthalmology;  Laterality: Left;  . COLONOSCOPY    . COLONOSCOPY WITH PROPOFOL N/A 12/09/2016   Procedure: COLONOSCOPY WITH PROPOFOL;  Surgeon: Toledo, Benay Pike, MD;  Location: [facility];  Service: Endoscopy;  Laterality: N/A;  . ESOPHAGOGASTRODUODENOSCOPY    . ESOPHAGOGASTRODUODENOSCOPY (EGD) WITH PROPOFOL N/A 12/09/2016   Procedure: ESOPHAGOGASTRODUODENOSCOPY (EGD) WITH PROPOFOL;  Surgeon: Toledo, Benay Pike, MD;  Location: [facility];  Service:  Endoscopy;  Laterality: N/A;  . KYPHOPLASTY N/A 08/10/2017   Procedure: [place] Shorts;  Surgeon: Hessie Knows, MD;  Location: [facility];  Service: Orthopedics;  Laterality: N/A;  . KYPHOPLASTY N/A 09/09/2017   Procedure: [id number];  Surgeon: Hessie Knows, MD;  Location: [facility];  Service: Orthopedics;  Laterality: N/A;  . KYPHOPLASTY N/A 09/28/2017    Procedure: [id number];  Surgeon: Hessie Knows, MD;  Location: [facility];  Service: Orthopedics;  Laterality: N/A;  . OOPHORECTOMY Bilateral 2003    Family History  Problem Relation Age of Onset  . Kidney disease Mother   . Diabetes Mellitus II Brother     Social History   Tobacco Use  . Smoking status: Former Smoker    Packs/day: 1.00    Types: Cigarettes    Quit date: 2009    Years since quitting: 11.9  . Smokeless tobacco: Never Used  Substance Use Topics  . Alcohol use: No  . Drug use: No    Patient Active Problem List   Diagnosis Date Noted  . Flank pain   . Intractable back pain 08/08/2017  . Compression fracture of lumbar vertebra ([place]) 08/08/2017  . Abdominal pain, RUQ 08/06/2017  . Fatigue 05/29/2017  . Exertional dyspnea 05/29/2017  . Guaiac positive stools 11/26/2016  . Iron deficiency anemia 09/01/2016  . B12 deficiency 08/31/2016  . Normocytic anemia 08/30/2016    Home Medications:    No outpatient medications have been marked as taking for the 03/09/19 encounter [facility] Encounter).    Allergies:   Methotrexate derivatives, Other, Ranitidine, Sulfasalazine, and Topiramate  Review of Systems (ROS): Review of Systems  Constitutional: Positive for fatigue. Negative for fever.  HENT: Positive for ear pain, postnasal drip, sinus pressure, sinus pain and trouble swallowing. Negative for congestion, ear discharge, rhinorrhea, sneezing and sore throat.   Eyes: Negative for pain, discharge and redness.  Respiratory: Negative for cough, chest tightness and shortness of breath.   Cardiovascular: Negative for chest pain and palpitations.  Gastrointestinal: Positive for nausea. Negative for abdominal pain, diarrhea and vomiting.  Musculoskeletal: Negative for arthralgias, back pain, myalgias and neck pain.  Skin: Negative for color change, pallor and rash.  Neurological: Negative for dizziness, syncope, weakness and headaches.  Hematological: Negative  for adenopathy.  Psychiatric/Behavioral: Positive for sleep disturbance.     Vital Signs: Today's Vitals   03/09/19 0843 03/09/19 0845 03/09/19 0914  BP: 131/82    Pulse: 100    Resp: 18    Temp: 98.4 F (36.9 C)    TempSrc: Oral    SpO2: 97%    Weight:  156 lb 1.4 oz (70.8 kg)   PainSc:  2  2     Physical Exam: Physical Exam  Constitutional: She is oriented to person, place, and time and well-developed, well-nourished, and in no distress.  Chronically fatigued appearing.  HENT:  Head: Normocephalic and atraumatic.  Right Ear: There is tenderness. Tympanic membrane is erythematous. A middle ear effusion is present.  Left Ear: Tympanic membrane normal.  Nose: Mucosal edema present. No rhinorrhea. Right sinus exhibits maxillary sinus tenderness.  Mouth/Throat: Uvula is midline and mucous membranes are normal. Posterior oropharyngeal erythema present. No oropharyngeal exudate or posterior oropharyngeal edema.  Eyes: Pupils are equal, round, and reactive to light.  Cardiovascular: Normal rate, regular rhythm, normal heart sounds and intact distal pulses.  Pulmonary/Chest: Effort normal and breath sounds normal.  Lymphadenopathy:       Head (right side): Submandibular adenopathy present.  Neurological: She is alert and oriented  to person, place, and time. Gait normal.  Skin: Skin is warm and dry. No rash noted. She is not diaphoretic.  Psychiatric: Mood, memory, affect and judgment normal.  Nursing note and vitals reviewed.   Urgent Care Treatments / Results:   No orders of the defined types were placed in this encounter.   LABS: PLEASE NOTE: all labs that were ordered this encounter are listed, however only abnormal results are displayed. Labs Reviewed - No data to display  EKG: -None  RADIOLOGY: No results found.  PROCEDURES: Procedures  MEDICATIONS RECEIVED THIS VISIT: Medications - No data to display  PERTINENT CLINICAL COURSE NOTES/UPDATES:   Initial  Impression / Assessment and Plan / Urgent Care Course:  Pertinent labs & imaging results that were available during my care of the patient were personally reviewed by me and considered in my medical decision making (see lab/imaging section of note for values and interpretations).  Ashley Mack is a 73 y.o. female who presents to [facility] today with complaints of Sinus Problem   Patient overall well appearing and in no acute distress today in clinic. Presenting symptoms (see HPI) and exam as documented above. Exam consistent with acute maxillary sinusitis and AOME on the RIGHT. PMH (+) for recurrent sinus issues. This patient is known to be from the cancer center. She develops sinus infections frequently and becomes ill quickly. Patient on chronic nasal sprays and steroids. She has tried mucolytics as well. Will proceed with treatment using a 7 day course of amoxicillin-clavulanate. Discussed supportive care measures at home during acute phase of illness. Patient to rest as much as possible. She was encouraged to ensure adequate hydration (water and ORS) to prevent dehydration and electrolyte derangements. Patient may use APAP and/or IBU on an as needed basis for pain/fever.    Discussed that symptoms are associated with SARS-CoV-2 (novel coronavirus), however patient notes that she does not leave her home except for medical appointments where she always wears a face mask and socially distances. She declines SARS-CoV-2 testing today.   Discussed follow up with primary care physician in 1 week for re-evaluation. I have reviewed the follow up and strict return precautions for any new or worsening symptoms. Patient is aware of symptoms that would be deemed urgent/emergent, and would thus require further evaluation either here or in the emergency department. At the time of discharge, she verbalized understanding and consent with the discharge plan as it was reviewed with her. All questions were  fielded by provider and/or clinic staff prior to patient discharge.    Final Clinical Impressions / Urgent Care Diagnoses:   Final diagnoses:  Acute maxillary sinusitis, recurrence not specified  Acute otitis media, unspecified otitis media type    New Prescriptions:  [place] Controlled Substance Registry consulted? Not Applicable  Meds ordered this encounter  Medications  . amoxicillin-clavulanate (AUGMENTIN) 875-125 MG tablet    Sig: Take 1 tablet by mouth 2 (two) times daily for 7 days.    Dispense:  14 tablet    Refill:  0    Recommended Follow up Care:  Patient encouraged to follow up with the following provider within the specified time frame, or sooner as dictated by the severity of her symptoms. As always, she was instructed that for any urgent/emergent care needs, she should seek care either here or in the emergency department for more immediate evaluation.  Follow-up Information    Kirk Ruths, MD In 1 week.   Specialty: Internal Medicine Why: General  reassessment of symptoms if not improving Contact information: [place] [address] [phone]         NOTE: This note was prepared using Dragon dictation software along with smaller phrase technology. Despite my best ability to proofread, there is the potential that transcriptional errors may still occur from this process, and are completely unintentional.    Karen Kitchens, NP 03/10/19 2200

## 2019-03-13 ENCOUNTER — Encounter: Payer: Self-pay | Admitting: Nurse Practitioner

## 2019-03-15 ENCOUNTER — Other Ambulatory Visit: Payer: Self-pay

## 2019-03-16 ENCOUNTER — Inpatient Hospital Stay: Payer: Medicare Other

## 2019-03-16 VITALS — BP 152/82 | HR 99 | Temp 96.9°F | Resp 18

## 2019-03-16 DIAGNOSIS — D509 Iron deficiency anemia, unspecified: Secondary | ICD-10-CM

## 2019-03-16 DIAGNOSIS — E538 Deficiency of other specified B group vitamins: Secondary | ICD-10-CM

## 2019-03-16 MED ORDER — SODIUM CHLORIDE 0.9 % IV SOLN
Freq: Once | INTRAVENOUS | Status: AC
  Filled 2019-03-16: qty 250

## 2019-03-16 MED ORDER — IRON SUCROSE 20 MG/ML IV SOLN
200.0000 mg | Freq: Once | INTRAVENOUS | Status: AC
  Administered 2019-03-16: 13:00:00 200 mg via INTRAVENOUS
  Filled 2019-03-16: qty 10

## 2019-03-16 MED ORDER — SODIUM CHLORIDE 0.9 % IV SOLN: 200.0000 mg | Freq: Once | INTRAVENOUS | Status: DC

## 2019-03-16 NOTE — Patient Instructions (Signed)

## 2019-03-22 ENCOUNTER — Other Ambulatory Visit: Payer: Self-pay

## 2019-03-23 ENCOUNTER — Inpatient Hospital Stay: Payer: Medicare Other | Attending: Hematology and Oncology

## 2019-03-23 ENCOUNTER — Other Ambulatory Visit: Payer: Self-pay

## 2019-03-23 ENCOUNTER — Other Ambulatory Visit: Payer: Self-pay | Admitting: Hematology and Oncology

## 2019-03-23 VITALS — BP 149/74 | HR 85 | Temp 96.9°F | Resp 18

## 2019-03-23 DIAGNOSIS — E538 Deficiency of other specified B group vitamins: Secondary | ICD-10-CM

## 2019-03-23 DIAGNOSIS — D509 Iron deficiency anemia, unspecified: Secondary | ICD-10-CM | POA: Insufficient documentation

## 2019-03-23 MED ORDER — IRON SUCROSE 20 MG/ML IV SOLN
200.0000 mg | Freq: Once | INTRAVENOUS | Status: AC
  Administered 2019-03-23: 200 mg via INTRAVENOUS
  Filled 2019-03-23: qty 10

## 2019-03-23 MED ORDER — SODIUM CHLORIDE 0.9 % IV SOLN
Freq: Once | INTRAVENOUS | Status: AC
  Filled 2019-03-23: qty 250

## 2019-03-23 MED ORDER — SODIUM CHLORIDE 0.9 % IV SOLN: 200.0000 mg | Freq: Once | INTRAVENOUS | Status: DC

## 2019-03-23 NOTE — Patient Instructions (Signed)

## 2019-04-17 ENCOUNTER — Other Ambulatory Visit: Payer: Self-pay | Admitting: *Deleted

## 2019-04-17 MED ORDER — CYANOCOBALAMIN 1000 MCG/ML IJ SOLN: 1000.0000 ug | INTRAMUSCULAR | 3 refills | Status: DC

## 2019-04-28 ENCOUNTER — Ambulatory Visit
Admission: EM | Admit: 2019-04-28 | Discharge: 2019-04-28 | Disposition: A | Discharge status: Home / Self Care | Payer: Medicare Other | Attending: Urgent Care | Admitting: Urgent Care

## 2019-04-28 DIAGNOSIS — Z20822 Contact with and (suspected) exposure to covid-19: Secondary | ICD-10-CM | POA: Diagnosis present

## 2019-04-28 DIAGNOSIS — U071 COVID-19: Secondary | ICD-10-CM | POA: Diagnosis present

## 2019-04-28 LAB — SARS CORONAVIRUS 2 AG (30 MIN TAT): SARS Coronavirus 2 Ag: POSITIVE — AB

## 2019-04-28 MED ORDER — ONDANSETRON 4 MG PO TBDP: 4.0000 mg | ORAL_TABLET | Freq: Three times a day (TID) | ORAL | 0 refills | Status: DC | PRN

## 2019-04-28 NOTE — ED Triage Notes (Signed)
Pt presents with c/o sore throat, productive cough, sinus congestion, nausea, stomach pain and body aches. She reports symptoms started Tuesday. She states her neighbors came to visit last Thursday and received positive COVID results on Friday.

## 2019-04-28 NOTE — Discharge Instructions (Signed)
It was very nice seeing you today in clinic. Thank you for entrusting me with your care.   Rest and Stay HYDRATED. Water and electrolyte containing beverages (Gatorade, Pedialyte) are best to prevent dehydration and electrolyte abnormalities.  Use nausea medication as directed.   Make arrangements to follow up with your regular doctor in 1 week for re-evaluation if not improving. If your symptoms/condition worsens, please seek follow up care either here or in the ER. Please remember, our [facility] providers are "right here with you" when you need [place].   Again, it was my pleasure to take care of you today. Thank you for choosing our clinic. I hope that you start to feel better quickly.   Honor Loh, MSN, APRN, FNP-C, CEN Advanced Practice Provider [facility]

## 2019-04-29 ENCOUNTER — Telehealth: Payer: Self-pay | Admitting: Internal Medicine

## 2019-04-29 NOTE — ED Provider Notes (Signed)
[place], [place]   Name: Ashley Mack DOB: [birth year] MRN: [id number] CSN: [id number] PCP: Kirk Ruths, MD  Arrival date and time:  04/28/19 1028  Chief Complaint:  Cough and Sore Throat   NOTE: Prior to seeing the patient today, I have reviewed the triage nursing documentation and vital signs. Clinical staff has updated patient's PMH/PSHx, current medication list, and drug allergies/intolerances to ensure comprehensive history available to assist in medical decision making.   History:   HPI: Ashley Mack is a 74 y.o. female who presents today with complaints of fatigue, cough, congestion, sore throat, and diffuse body myalgias that started approximately 3 days ago. Patient is unsure if she has been running a fever or not. 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 Patient presents to clinic today with an elevated temperature of 99.1. Patient reports that she has felt hot and has experienced some chills. Cough has been non-productive with no associated no increased shortness of breath or wheezing. She has has some mild nausea. She denies that she has experienced any vomiting, diarrhea, or abdominal pain. Patient complains of altered sense of taste; "eveything tastes like bleach". She is eating and drinking well. Patient presents out of concerns for her personal health after being exposed to her neighbors who tested positive for SARS-CoV-2 (novel coronavirus) on for SARS-CoV-2 (novel coronavirus). Last exposure was on 04/25/2019. She has never been tested for SARS-CoV-2 (novel coronavirus) in the past per her report. Patient has been vaccinated for influenza this season. Despite her symptoms, patient has not taken any over the counter interventions to help improve/relieve her reported symptoms at home.   Past Medical History:  Diagnosis Date  . Anemia   . Anxiety   . Arthritis    Rheumatoid  . [place] of gallbladder   . 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  Centrilobular emphysema ([place])   . Dyspnea   . Fatty liver   . GERD (gastroesophageal reflux disease)   . History of fractured vertebra    sees chiropractor  . 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 Iron (Fe) deficiency anemia   . Migraine headache    in past  . Migraines   . Motion sickness   . 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 Osteoporosis   . Rheumatoid arthritis ([place])   . Vertigo     Past Surgical History:  Procedure Laterality Date  . CATARACT EXTRACTION W/PHACO Right 08/12/2016   Procedure: CATARACT EXTRACTION PHACO AND INTRAOCULAR LENS PLACEMENT ([place])  Right;  Surgeon: Leandrew Koyanagi, MD;  Location: [facility];  Service: Ophthalmology;  Laterality: Right;  . CATARACT EXTRACTION W/PHACO Left 09/30/2016   Procedure: CATARACT EXTRACTION PHACO AND INTRAOCULAR LENS PLACEMENT ([place]) Left;  Surgeon: Leandrew Koyanagi, MD;  Location: [facility];  Service: Ophthalmology;  Laterality: Left;  . COLONOSCOPY    . COLONOSCOPY WITH PROPOFOL N/A 12/09/2016   Procedure: COLONOSCOPY WITH PROPOFOL;  Surgeon: Toledo, Benay Pike, MD;  Location: [facility];  Service: Endoscopy;  Laterality: N/A;  . ESOPHAGOGASTRODUODENOSCOPY    . ESOPHAGOGASTRODUODENOSCOPY (EGD) WITH PROPOFOL N/A 12/09/2016   Procedure: ESOPHAGOGASTRODUODENOSCOPY (EGD) WITH PROPOFOL;  Surgeon: Toledo, Benay Pike, MD;  Location: [facility];  Service: Endoscopy;  Laterality: N/A;  . KYPHOPLASTY N/A 08/10/2017   Procedure: Hewitt Shorts;  Surgeon: Hessie Knows, MD;  Location: [facility];  Service: Orthopedics;  Laterality: N/A;  . KYPHOPLASTY N/A 09/09/2017   Procedure: [id number]-W9,V9;  Surgeon: Hessie Knows, MD;  Location: [facility];  Service: Orthopedics;  Laterality: N/A;  . KYPHOPLASTY N/A 09/28/2017   Procedure: [id number];  Surgeon: Hessie Knows, MD;  Location: [facility];  Service: Orthopedics;  Laterality: N/A;  . OOPHORECTOMY Bilateral 2003    Family History  Problem Relation Age  of Onset  . Kidney disease Mother   . Diabetes Mellitus II Brother     Social History   Tobacco Use  . Smoking status: Former Smoker    Packs/day: 1.00    Types: Cigarettes    Quit date: 2009    Years since quitting: 12.1  . Smokeless tobacco: Never Used  Substance Use Topics  . Alcohol use: No  . Drug use: No    Patient Active Problem List   Diagnosis Date Noted  . Flank pain   . Intractable back pain 08/08/2017  . Compression fracture of lumbar vertebra ([place]) 08/08/2017  . Abdominal pain, RUQ 08/06/2017  . Fatigue 05/29/2017  . Exertional dyspnea 05/29/2017  . Guaiac positive stools 11/26/2016  . Iron deficiency anemia 09/01/2016  . B12 deficiency 08/31/2016  . Normocytic anemia 08/30/2016    Home Medications:    Current Meds  Medication Sig  . budesonide-formoterol (SYMBICORT) 80-4.5 MCG/ACT inhaler Inhale 2 puffs into the lungs every morning.   . Cholecalciferol (VITAMIN D3) 2000 UNITS capsule Take 2,000 Units by mouth daily.   . cyanocobalamin (,VITAMIN B-12,) 1000 MCG/ML injection Inject 1 mL (1,000 mcg total) into the muscle every 30 (thirty) days.  . cyclobenzaprine (FLEXERIL) 10 MG tablet 1/2-1 po tid prn  . DEXILANT 60 MG capsule Take 60 mg by mouth daily.   . Fe Fum-FePoly-Vit C-Vit B3 (INTEGRA) 62.5-62.5-40-3 MG CAPS Take 1 capsule by mouth daily.  . fluticasone (FLONASE) 50 MCG/ACT nasal spray Place 1-2 sprays into both nostrils daily as needed for allergies or rhinitis.   . predniSONE (DELTASONE) 5 MG tablet Take 5 mg by mouth daily.     Allergies:   Methotrexate derivatives, Other, Ranitidine, Sulfasalazine, and Topiramate  Review of Systems (ROS): Review of Systems  Constitutional: Positive for fatigue. Negative for fever.  HENT: Positive for congestion and sore throat. Negative for ear pain, postnasal drip, rhinorrhea, sinus pressure, sinus pain and sneezing.        Altered sense of taste  Eyes: Negative for pain, discharge and redness.    Respiratory: Positive for cough and shortness of breath (chronic). Negative for chest tightness.   Cardiovascular: Negative for chest pain and palpitations.  Gastrointestinal: Positive for nausea. Negative for abdominal pain, diarrhea and vomiting.  Musculoskeletal: Positive for myalgias. Negative for arthralgias, back pain and neck pain.  Skin: Negative for color change, pallor and rash.  Allergic/Immunologic: Positive for immunocompromised state.  Neurological: Negative for dizziness, syncope, weakness and headaches.  Hematological: Negative for adenopathy.  Psychiatric/Behavioral: The patient is nervous/anxious.      Vital Signs: Today's Vitals   04/28/19 1042 04/28/19 1045 04/28/19 1142  BP:  (!) 141/97   Pulse:  (!) 108   Temp:  99.1 F (37.3 C)   TempSrc:  Oral   SpO2:  98%   Weight: 150 lb (68 kg)    Height: _0  (1.575 m)    PainSc: 4   4     Physical Exam: Physical Exam  Constitutional: She is oriented to person, place, and time and well-developed, well-nourished, and in no distress.  Acutely ill appearing; fatigued/listless.  HENT:  Head: Normocephalic and atraumatic.  Nose: Mucosal edema present. No rhinorrhea or sinus  tenderness.  Mouth/Throat: Uvula is midline and mucous membranes are normal. Posterior oropharyngeal erythema present. No oropharyngeal exudate or posterior oropharyngeal edema.  Eyes: Pupils are equal, round, and reactive to light.  Cardiovascular: Regular rhythm, normal heart sounds and intact distal pulses. Tachycardia present.  Pulmonary/Chest: Effort normal and breath sounds normal.  Mild chronic cough noted in clinic. Mild SOB. No increased WOB. No distress. Able to speak in complete sentences without difficulties. SPO2 98% on RA.  Neurological: She is alert and oriented to person, place, and time. Gait normal.  Skin: Skin is warm and dry. No rash noted. She is not diaphoretic.  Psychiatric: Mood, memory, affect and judgment normal.  Nursing  note and vitals reviewed.   Urgent Care Treatments / Results:   Orders Placed This Encounter  Procedures  . Novel Coronavirus, NAA (Hosp order, Send-out to Ref Lab; TAT 18-24 hrs  . SARS Coronavirus 2 Ag (30 min TAT) - Nasal Swab (BD Veritor Kit)    LABS: PLEASE NOTE: all labs that were ordered this encounter are listed, however only abnormal results are displayed. Labs Reviewed  SARS CORONAVIRUS 2 AG (30 MIN TAT) - Abnormal; Notable for the following components:      Result Value   SARS Coronavirus 2 Ag POSITIVE (*)    All other components within normal limits  NOVEL CORONAVIRUS, NAA (HOSP ORDER, SEND-OUT TO REF LAB; TAT 18-24 HRS)    EKG: -None  RADIOLOGY: No results found.  PROCEDURES: Procedures  MEDICATIONS RECEIVED THIS VISIT: Medications - No data to display  PERTINENT CLINICAL COURSE NOTES/UPDATES: Clinical Course   Fri Apr 28, 2019  1115 Patient referred to [facility] infusion clinic for consideration for mAb (bamlanivimab) infusions follow SARS-CoV-2 (+) result.    [BG]    Clinical Course User Index [BG] Karen Kitchens, NP    Initial Impression / Assessment and Plan / Urgent Care Course:  Pertinent labs & imaging results that were available during my care of the patient were personally reviewed by me and considered in my medical decision making (see lab/imaging section of note for values and interpretations).  Ashley Mack is a 74 y.o. female who presents to [facility] today with complaints of Cough and Sore Throat  Patient acutely ill appearing (non-toxic) appearing in clinic today. She does not appear to be in any acute distress. Presenting symptoms (see HPI) and exam as documented above. She presents with symptoms associated with SARS-CoV-2 (novel coronavirus) following direct exposure. Discussed typical symptom constellation. Reviewed potential for infection and need for testing. Patient amenable to being tested. Rapid SARS-CoV-2 Ag swab collected by  certified clinical staff; results POSITIVE. Patient is very anxious and concerned about the diagnosis due to her overall health status. She is likewise concerned about passing virus to her family. Reassurance provided. Reviewed ways to minimize risk of transmission. Discussed supportive care measures at home during acute phase of illness. Patient to rest as much as possible. She was encouraged to ensure adequate hydration (water and ORS) to prevent dehydration and electrolyte derangements. Patient may use APAP and/or IBU on an as needed basis for pain/fever. Will send in a prescription for a supply on ondansetron to use on a PRN basis.    Patient has multiple co-morbidities that increase her SARS-CoV-2 morbidity and mortality risk including age, emphysema, and auto immunodisorders. Co-morbidities should qualify her for treatment with the monoclonal antibody (bamlanivimab) infusion. Call placed to the [facility] response team; spoke with Holley Raring, RN. Patient to  be contacted to discuss getting her in for the infusion either today or tomorrow if she is found to be eligible and ultimately consents.  I have reviewed the follow up and strict return precautions for any new or worsening symptoms. Patient is aware of symptoms that would be deemed urgent/emergent, and would thus require further evaluation either here or in the emergency department. At the time of discharge, she verbalized understanding and consent with the discharge plan as it was reviewed with her. All questions were fielded by provider and/or clinic staff prior to patient discharge.    Final Clinical Impressions / Urgent Care Diagnoses:   Final diagnoses:  [id number]  Encounter for laboratory testing for COVID-19 virus    New Prescriptions:  [place] Controlled Substance Registry consulted? Not Applicable  Meds ordered this encounter  Medications  . ondansetron (ZOFRAN-ODT) 4 MG disintegrating tablet    Sig: Take 1 tablet (4  mg total) by mouth every 8 (eight) hours as needed for nausea or vomiting.    Dispense:  15 tablet    Refill:  0    Recommended Follow up Care:  Patient encouraged to follow up with the following provider within the specified time frame, or sooner as dictated by the severity of her symptoms. As always, she was instructed that for any urgent/emergent care needs, she should seek care either here or in the emergency department for more immediate evaluation.  Follow-up Information    Kirk Ruths, MD In 1 week.   Specialty: Internal Medicine Why: General reassessment of symptoms if not improving Contact information: [place] [id number] [phone]         NOTE: This note was prepared using Dragon dictation software along with smaller phrase technology. Despite my best ability to proofread, there is the potential that transcriptional errors may still occur from this process, and are completely unintentional.    Karen Kitchens, NP 04/29/19 1111

## 2019-04-29 NOTE — Telephone Encounter (Signed)
Called to discuss with patient about Covid symptoms and the use of bamlanivimab, a monoclonal antibody infusion for those with mild to moderate Covid symptoms and at high risk of hospitalization.   Per pt, she was referred to clinic in [facility] yesterday and has already received infusion. She is feeling much better today. Reviewed isolation guidelines.    Alan Ripper, NP-C [place]

## 2019-05-02 LAB — NOVEL CORONAVIRUS, NAA (HOSP ORDER, SEND-OUT TO REF LAB; TAT 18-24 HRS)

## 2019-05-29 ENCOUNTER — Other Ambulatory Visit: Payer: Self-pay

## 2019-05-29 ENCOUNTER — Emergency Department: Payer: Medicare Other

## 2019-05-29 ENCOUNTER — Encounter: Payer: Self-pay | Admitting: Emergency Medicine

## 2019-05-29 ENCOUNTER — Emergency Department
Admission: EM | Admit: 2019-05-29 | Discharge: 2019-05-30 | Disposition: A | Discharge status: Home / Self Care | Payer: Medicare Other | Attending: Emergency Medicine | Admitting: Emergency Medicine

## 2019-05-29 DIAGNOSIS — S22040A Wedge compression fracture of fourth thoracic vertebra, initial encounter for closed fracture: Secondary | ICD-10-CM | POA: Diagnosis not present

## 2019-05-29 DIAGNOSIS — S22020A Wedge compression fracture of second thoracic vertebra, initial encounter for closed fracture: Secondary | ICD-10-CM | POA: Diagnosis not present

## 2019-05-29 DIAGNOSIS — Y9389 Activity, other specified: Secondary | ICD-10-CM | POA: Diagnosis not present

## 2019-05-29 DIAGNOSIS — Z981 Arthrodesis status: Secondary | ICD-10-CM | POA: Diagnosis not present

## 2019-05-29 DIAGNOSIS — S22070A Wedge compression fracture of T9-T10 vertebra, initial encounter for closed fracture: Secondary | ICD-10-CM | POA: Diagnosis not present

## 2019-05-29 DIAGNOSIS — X58XXXA Exposure to other specified factors, initial encounter: Secondary | ICD-10-CM | POA: Insufficient documentation

## 2019-05-29 DIAGNOSIS — M8580 Other specified disorders of bone density and structure, unspecified site: Secondary | ICD-10-CM | POA: Insufficient documentation

## 2019-05-29 DIAGNOSIS — Z79899 Other long term (current) drug therapy: Secondary | ICD-10-CM | POA: Diagnosis not present

## 2019-05-29 DIAGNOSIS — Y929 Unspecified place or not applicable: Secondary | ICD-10-CM | POA: Insufficient documentation

## 2019-05-29 DIAGNOSIS — Y999 Unspecified external cause status: Secondary | ICD-10-CM | POA: Insufficient documentation

## 2019-05-29 DIAGNOSIS — M546 Pain in thoracic spine: Secondary | ICD-10-CM | POA: Diagnosis present

## 2019-05-29 DIAGNOSIS — M069 Rheumatoid arthritis, unspecified: Secondary | ICD-10-CM | POA: Diagnosis not present

## 2019-05-29 DIAGNOSIS — S22000A Wedge compression fracture of unspecified thoracic vertebra, initial encounter for closed fracture: Secondary | ICD-10-CM

## 2019-05-29 LAB — CBC WITH DIFFERENTIAL/PLATELET
Abs Immature Granulocytes: 0.17 10*3/uL — ABNORMAL HIGH (ref 0.00–0.07)
Basophils Absolute: 0.1 10*3/uL (ref 0.0–0.1)
Basophils Relative: 0 %
Eosinophils Absolute: 0 10*3/uL (ref 0.0–0.5)
Eosinophils Relative: 0 %
HCT: 31.1 % — ABNORMAL LOW (ref 36.0–46.0)
Hemoglobin: 10 g/dL — ABNORMAL LOW (ref 12.0–15.0)
Immature Granulocytes: 1 %
Lymphocytes Relative: 12 %
Lymphs Abs: 2.2 10*3/uL (ref 0.7–4.0)
MCH: 29.2 pg (ref 26.0–34.0)
MCHC: 32.2 g/dL (ref 30.0–36.0)
MCV: 90.7 fL (ref 80.0–100.0)
Monocytes Absolute: 1.2 10*3/uL — ABNORMAL HIGH (ref 0.1–1.0)
Monocytes Relative: 7 %
Neutro Abs: 13.7 10*3/uL — ABNORMAL HIGH (ref 1.7–7.7)
Neutrophils Relative %: 80 %
Platelets: 532 10*3/uL — ABNORMAL HIGH (ref 150–400)
RBC: 3.43 MIL/uL — ABNORMAL LOW (ref 3.87–5.11)
RDW: 15.9 % — ABNORMAL HIGH (ref 11.5–15.5)
WBC: 17.3 10*3/uL — ABNORMAL HIGH (ref 4.0–10.5)
nRBC: 0 % (ref 0.0–0.2)

## 2019-05-29 LAB — COMPREHENSIVE METABOLIC PANEL
ALT: 21 U/L (ref 0–44)
AST: 20 U/L (ref 15–41)
Albumin: 4 g/dL (ref 3.5–5.0)
Alkaline Phosphatase: 69 U/L (ref 38–126)
Anion gap: 9 (ref 5–15)
BUN: 8 mg/dL (ref 8–23)
CO2: 26 mmol/L (ref 22–32)
Calcium: 8.8 mg/dL — ABNORMAL LOW (ref 8.9–10.3)
Chloride: 97 mmol/L — ABNORMAL LOW (ref 98–111)
Creatinine, Ser: 0.77 mg/dL (ref 0.44–1.00)
GFR calc Af Amer: >60 mL/min (ref >60)
GFR calc non Af Amer: >60 mL/min (ref >60)
Glucose, Bld: 111 mg/dL — ABNORMAL HIGH (ref 70–99)
Potassium: 3.7 mmol/L (ref 3.5–5.1)
Sodium: 132 mmol/L — ABNORMAL LOW (ref 135–145)
Total Bilirubin: 0.7 mg/dL (ref 0.3–1.2)
Total Protein: 6.9 g/dL (ref 6.5–8.1)

## 2019-05-29 LAB — TROPONIN I (HIGH SENSITIVITY): Troponin I (High Sensitivity): 8 ng/L (ref <18)

## 2019-05-29 MED ORDER — FENTANYL CITRATE (PF) 100 MCG/2ML IJ SOLN
50.0000 ug | Freq: Once | INTRAMUSCULAR | Status: AC
  Administered 2019-05-29: 50 ug via INTRAVENOUS
  Filled 2019-05-29: qty 2

## 2019-05-29 MED ORDER — ACETAMINOPHEN 500 MG PO TABS
1000.0000 mg | ORAL_TABLET | Freq: Once | ORAL | Status: AC
  Administered 2019-05-29: 1000 mg via ORAL
  Filled 2019-05-29: qty 2

## 2019-05-29 MED ORDER — ONDANSETRON HCL 4 MG/2ML IJ SOLN
4.0000 mg | Freq: Once | INTRAMUSCULAR | Status: AC
  Administered 2019-05-29: 4 mg via INTRAVENOUS
  Filled 2019-05-29: qty 2

## 2019-05-29 NOTE — ED Provider Notes (Signed)
[facility] Emergency Department Provider Note  ____________________________________________  Time seen: Approximately 11:51 PM  I have reviewed the triage vital signs and the nursing notes.   HISTORY  Chief Complaint Back Pain   HPI Ashley Mack is a 74 y.o. female with a history of rheumatoid arthritis, osteoporosis, several compression fracture status post kyphoplasty, emphysema, anemia who presents for evaluation of back pain.  Patient reports no back pain when she went to bed last night.  This morning she woke up with back pain that she describes as sharp, constant, severe, worse with movement of her torso, located in the midline of her upper back.  No trauma, no fever, no chest pain or abdominal pain.  She does report that the pain is worse with as well with inspiration.  No prior history of PE or DVT, no recent travel immobilization, no leg pain or swelling, no hemoptysis or exogenous hormones.  No cough or fever.  No paresthesias, numbness, or weakness of her extremities.   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)    Iron (Fe) deficiency anemia    Migraine headache    in past   Migraines    Motion sickness    No natural teeth    Osteoporosis    Rheumatoid arthritis ([place])    Vertigo     Patient Active Problem List   Diagnosis Date Noted   Flank pain    Intractable back pain 08/08/2017   Compression fracture of lumbar vertebra ([place]) 08/08/2017   Abdominal pain, RUQ 08/06/2017   Fatigue 05/29/2017   Exertional dyspnea 05/29/2017   Guaiac positive stools 11/26/2016   Iron deficiency  anemia 09/01/2016   B12 deficiency 08/31/2016   Normocytic anemia 08/30/2016    Past Surgical History:  Procedure Laterality Date   CATARACT EXTRACTION W/PHACO Right 08/12/2016   Procedure: CATARACT EXTRACTION PHACO AND INTRAOCULAR LENS PLACEMENT ([place])  Right;  Surgeon: Leandrew Koyanagi, MD;  Location: [place];  Service: Ophthalmology;  Laterality: Right;   CATARACT EXTRACTION W/PHACO Left 09/30/2016   Procedure: CATARACT EXTRACTION PHACO AND INTRAOCULAR LENS PLACEMENT ([place]) Left;  Surgeon: Leandrew Koyanagi, MD;  Location: [place];  Service: Ophthalmology;  Laterality: Left;   COLONOSCOPY     COLONOSCOPY WITH PROPOFOL N/A 12/09/2016   Procedure: COLONOSCOPY WITH PROPOFOL;  Surgeon: Toledo, Benay Pike, MD;  Location: [facility];  Service: Endoscopy;  Laterality: N/A;   ESOPHAGOGASTRODUODENOSCOPY     ESOPHAGOGASTRODUODENOSCOPY (EGD) WITH PROPOFOL N/A 12/09/2016   Procedure: ESOPHAGOGASTRODUODENOSCOPY (EGD) WITH PROPOFOL;  Surgeon: Toledo, Benay Pike, MD;  Location: [facility];  Service: Endoscopy;  Laterality: N/A;   KYPHOPLASTY N/A 08/10/2017   Procedure: Hewitt Shorts;  Surgeon: Hessie Knows, MD;  Location: [facility];  Service: Orthopedics;  Laterality: N/A;   KYPHOPLASTY N/A 09/09/2017   Procedure: [id number];  Surgeon: Hessie Knows, MD;  Location: [facility];  Service: Orthopedics;  Laterality: N/A;   KYPHOPLASTY N/A 09/28/2017   Procedure: [id number];  Surgeon: Hessie Knows, MD;  Location: [facility];  Service: Orthopedics;  Laterality: N/A;   OOPHORECTOMY Bilateral  2003    Prior to Admission medications   Medication Sig Start Date End Date Taking? Authorizing Provider  budesonide-formoterol (SYMBICORT) 80-4.5 MCG/ACT inhaler Inhale 2 puffs into the lungs every morning.     [provider]  Cholecalciferol (VITAMIN D3) 2000 UNITS capsule Take 2,000 Units by mouth daily.     [provider]  cyanocobalamin  (,VITAMIN B-12,) 1000 MCG/ML injection Inject 1 mL (1,000 mcg total) into the muscle every 30 (thirty) days. 04/17/19   Lequita Asal, MD  cyclobenzaprine (FLEXERIL) 10 MG tablet 1/2-1 po tid prn 01/25/18   [provider]  DEXILANT 60 MG capsule Take 60 mg by mouth daily.  08/04/14   [provider]  docusate sodium (COLACE) 100 MG capsule Take 1 capsule (100 mg total) by mouth 2 (two) times daily for 7 days. 05/30/19 06/06/19  Rudene Re, MD  Fe Fum-FePoly-Vit C-Vit B3 (INTEGRA) 62.5-62.5-40-3 MG CAPS Take 1 capsule by mouth daily. 11/29/13   [provider]  fluticasone (FLONASE) 50 MCG/ACT nasal spray Place 1-2 sprays into both nostrils daily as needed for allergies or rhinitis.     [provider]  ondansetron (ZOFRAN ODT) 4 MG disintegrating tablet Take 1 tablet (4 mg total) by mouth every 8 (eight) hours as needed. 05/30/19   Rudene Re, MD  oxyCODONE (ROXICODONE) 5 MG immediate release tablet Take 1 tablet (5 mg total) by mouth every 8 (eight) hours as needed. 05/30/19 05/29/20  Rudene Re, MD  predniSONE (DELTASONE) 5 MG tablet Take 5 mg by mouth daily.     [provider]  senna (SENOKOT) 8.6 MG TABS tablet Take 1 tablet (8.6 mg total) by mouth at bedtime for 7 days. 05/30/19 06/06/19  Rudene Re, MD    Allergies Methotrexate derivatives, Other, Ranitidine, Sulfasalazine, and Topiramate  Family History  Problem Relation Age of Onset   Kidney disease Mother    Diabetes Mellitus II Brother     Social History Social History   Tobacco Use   Smoking status: Former Smoker    Packs/day: 1.00    Types: Cigarettes    Quit date: 2009    Years since quitting: 12.2   Smokeless tobacco: Never Used  Substance Use Topics   Alcohol use: No   Drug use: No    Review of Systems  Constitutional: Negative for fever. Eyes: Negative for visual changes. ENT: Negative for sore throat. Neck: No neck pain    Cardiovascular: Negative for chest pain. Respiratory: Negative for shortness of breath. Gastrointestinal: Negative for abdominal pain, vomiting or diarrhea. Genitourinary: Negative for dysuria. Musculoskeletal: + back pain. Skin: Negative for rash. Neurological: Negative for headaches, weakness or numbness. Psych: No SI or HI  ____________________________________________   PHYSICAL EXAM:  VITAL SIGNS: ED Triage Vitals  Enc Vitals Group     BP 05/29/19 2043 (!) 180/89     Pulse Rate 05/29/19 2043 (!) 107     Resp 05/29/19 2043 (!) 22     Temp 05/29/19 2043 98.1 F (36.7 C)     Temp Source 05/29/19 2043 Oral     SpO2 05/29/19 2043 97 %     Weight 05/29/19 2044 150 lb (68 kg)     Height 05/29/19 2044 5\' 2"  (1.575 m)     Head Circumference --      Peak Flow --      Pain Score 05/29/19 2043 10     Pain Loc --      Pain Edu? --  Excl. in [place]? --     Constitutional: Alert and oriented, patient seems to be uncomfortable due to pain, moving very slowly, having pain getting up from wheelchair and sitting down in stretcher.  HEENT:      Head: Normocephalic and atraumatic.         Eyes: Conjunctivae are normal. Sclera is non-icteric.       Mouth/Throat: Mucous membranes are moist.       Neck: Supple with no signs of meningismus. Cardiovascular: Tachycardic with regular rhythm. Respiratory: Normal respiratory effort. Lungs are clear to auscultation bilaterally. No wheezes, crackles, or rhonchi.  Gastrointestinal: Soft, non tender, no palpable pulsatile mass. Genitourinary: No CVA tenderness. Musculoskeletal: tender to palpation on the midline of the lower thoracic spine with no bruising or deformity. Neurologic: Normal speech and language. Face is symmetric. Moving all extremities. No gross focal neurologic deficits are appreciated. Skin: Skin is warm, dry and intact. No rash noted. Psychiatric: Mood and affect are normal. Speech and behavior are  normal.  ____________________________________________   LABS (all labs ordered are listed, but only abnormal results are displayed)  Labs Reviewed  COMPREHENSIVE METABOLIC PANEL - Abnormal; Notable for the following components:      Result Value   Sodium 132 (*)    Chloride 97 (*)    Glucose, Bld 111 (*)    Calcium 8.8 (*)    All other components within normal limits  CBC WITH DIFFERENTIAL/PLATELET - Abnormal; Notable for the following components:   WBC 17.3 (*)    RBC 3.43 (*)    Hemoglobin 10.0 (*)    HCT 31.1 (*)    RDW 15.9 (*)    Platelets 532 (*)    Neutro Abs 13.7 (*)    Monocytes Absolute 1.2 (*)    Abs Immature Granulocytes 0.17 (*)    All other components within normal limits  TROPONIN I (HIGH SENSITIVITY)  TROPONIN I (HIGH SENSITIVITY)   ____________________________________________  EKG  ED ECG REPORT I, Rudene Re, the attending physician, personally viewed and interpreted this ECG.  Sinus tachycardia, rate of 102, normal intervals, normal axis, no ST elevations or depressions.  No significant changes when compared to prior. ____________________________________________  RADIOLOGY  I have personally reviewed the images performed during this visit and I agree with the Radiologist's read.   Interpretation by Radiologist:  DG Chest 2 View  Result Date: 05/29/2019 CLINICAL DATA:  74 year female with shortness of breath. EXAM: CHEST - 2 VIEW COMPARISON:  Chest radiograph dated 05/06/2015 FINDINGS: Background of emphysema. No focal consolidation, pleural effusion, pneumothorax. The cardiac silhouette is within normal limits. Atherosclerotic calcification of the aorta. Osteopenia with multilevel degenerative changes of the spine and lower thoracic compression fractures and vertebroplasty. No acute osseous pathology. IMPRESSION: 1. No acute cardiopulmonary process. 2. Emphysema. Electronically Signed   By: Anner Crete M.D.   On: 05/29/2019 21:11    CT Thoracic Spine Wo Contrast  Result Date: 05/30/2019 CLINICAL DATA:  Upper back pain. History of multiple vertebral augmentations. EXAM: CT THORACIC SPINE WITHOUT CONTRAST TECHNIQUE: Multidetector CT images of the thoracic were obtained using the standard protocol without intravenous contrast. COMPARISON:  None. FINDINGS: Alignment: Normal. Vertebrae: Status post vertebral augmentation at T11, T12 and L1. There is chronic height loss that is unchanged at T4, T6 and T9. There is no new compression fracture. No progressive height loss at the augmented levels. Paraspinal and other soft tissues: There is calcific aortic atherosclerosis. Disc levels: There is no spinal canal stenosis.  No visible disc herniation. IMPRESSION: 1. No acute fracture or static subluxation of the thoracic spine. 2. Unchanged chronic height loss at T4, T6 and T9. 3. Status post vertebral augmentation at T11, T12 and L1, without progression of height loss. 4. Aortic Atherosclerosis (ICD10-I70.0). Electronically Signed   By: Ulyses Jarred M.D.   On: 05/30/2019 00:39   CT ANGIO CHEST AORTA W/CM & OR WO/CM  Result Date: 05/30/2019 CLINICAL DATA:  74 year old female with back pain. EXAM: CT ANGIOGRAPHY CHEST WITH CONTRAST TECHNIQUE: Multidetector CT imaging of the chest was performed using the standard protocol during bolus administration of intravenous contrast. Multiplanar CT image reconstructions and MIPs were obtained to evaluate the vascular anatomy. CONTRAST:  56mL OMNIPAQUE IOHEXOL 350 MG/ML SOLN COMPARISON:  Chest CT dated 05/06/2015. FINDINGS: Evaluation is limited due to streak artifact caused by patient's arms. Cardiovascular: There is no cardiomegaly or pericardial effusion. There is moderate atherosclerotic calcification of the thoracic aorta. No aneurysmal dilatation or dissection. The origins of the great vessels of the aortic arch appear patent as visualized. The central pulmonary arteries are patent for the degree of  opacification. Mediastinum/Nodes: There is no hilar or mediastinal adenopathy. There is a small hiatal hernia. The esophagus is grossly unremarkable. No mediastinal fluid collection. Lungs/Pleura: There is background of centrilobular emphysema. Linear scarring in the lingula. There is no focal consolidation, pleural effusion, or pneumothorax. The central airways are patent. Upper Abdomen: No acute abnormality. Musculoskeletal: Osteopenia with extensive multilevel degenerative changes of the spine and multilevel compression fractures and lower thoracic multilevel vertebroplasty. Mild compression fracture of the superior endplate of T2 and T4 and T9 appear new since the prior CT. Correlation with clinical exam and point tenderness recommended. No retropulsed fragment. Review of the MIP images confirms the above findings. IMPRESSION: 1. No CT evidence of aortic dissection or aneurysm. 2. Osteopenia with multilevel compression fractures and multilevel vertebroplasty. Mild compression deformities of the superior endplates of T2 and T4 and T9, new since the prior CT. Correlation with clinical exam and point tenderness recommended. No retropulsed fragment. 3. Aortic Atherosclerosis (ICD10-I70.0). Electronically Signed   By: Anner Crete M.D.   On: 05/30/2019 01:35     ____________________________________________   PROCEDURES  Procedure(s) performed: None Procedures Critical Care performed:  None ____________________________________________   INITIAL IMPRESSION / ASSESSMENT AND PLAN / ED COURSE  74 y.o. female with a history of rheumatoid arthritis, osteoporosis, several compression fracture status post kyphoplasty, emphysema, anemia who presents for evaluation of back pain.  Patient looks uncomfortable due to pain, moving very slowly, seems to have worsening pain with movement of the torso.  Palpation of the mid to lower thoracic midline reproduces the pain with no obvious bruising or deformity.  She is  otherwise neurologically intact.  Pulses are strong and intact in all 4 extremities.  Differential diagnosis including compression fracture versus degenerative disc disease versus arthritis versus discitis.  PE and dissection considered but thought less likely based on history.  Plan for labs, EKG, x-rays followed by CT if no findings seen on x-ray.  Will give fentanyl, Tylenol and Zofran for symptom relief.  _________________________ 1:51 AM on 05/30/2019 -----------------------------------------  As part of my medical decision making, I reviewed the following data within the [place] x-ray showing no evidence of compression fracture therefore patient was sent for a CT of the thoracic spine without contrast which did not show any abnormalities.  Patient was then sent for CT angiogram to rule out dissection or PE.  The CT with contrast was negative for PE or dissection but did show new mild compression fractures of T2, T4 and T9 with no retropulsion.  Patient was placed on telemetry for possible cardiac etiology and monitoring.  I have  reviewed the EKG and looked at the rhythm strip in the room which showed sinus tachycardia. I have reviewed patient's previous medical records and PMH. Labs were reviewed by me and showed negative HS-troponin x 2, no electrolyte derangements; labs did show leukocytosis and thrombocytopenia most likely stress-induced in the setting of compression fractures and pain.  These findings were discussed with patient and I recommended follow-up with her doctor for further evaluation.  Previous labs also showed thrombocytopenia and leukocytosis.. IV fentanyl and p.o. Tylenol were given with significant improvement of the pain.  Patient was transition to p.o. oxycodone and Tylenol for pain.  She was provided with a short course.  I recommended follow-up with her orthopedics doctor, Dr. Rudene Christians.  Discussed return precautions for any signs of spinal cord  involvement.      _____________________________________________ Please note:  Patient was evaluated in Emergency Department today for the symptoms described in the history of present illness. Patient was evaluated in the context of the global COVID-19 pandemic, which necessitated consideration that the patient might be at risk for infection with the SARS-CoV-2 virus that causes COVID-19. Institutional protocols and algorithms that pertain to the evaluation of patients at risk for COVID-19 are in a state of rapid change based on information released by regulatory bodies including the CDC and federal and state organizations. These policies and algorithms were followed during the patient's care in the ED.  Some ED evaluations and interventions may be delayed as a result of limited staffing during the pandemic.   ____________________________________________   FINAL CLINICAL IMPRESSION(S) / ED DIAGNOSES   Final diagnoses:  Compression fracture of body of thoracic vertebra (HCC)      NEW MEDICATIONS STARTED DURING THIS VISIT:  ED Discharge Orders         Ordered    oxyCODONE (ROXICODONE) 5 MG immediate release tablet  Every 8 hours PRN     05/30/19 0150    ondansetron (ZOFRAN ODT) 4 MG disintegrating tablet  Every 8 hours PRN     05/30/19 0150    senna (SENOKOT) 8.6 MG TABS tablet  Daily at bedtime     05/30/19 0150    docusate sodium (COLACE) 100 MG capsule  2 times daily     05/30/19 0150           Note:  This document was prepared using Dragon voice recognition software and may include unintentional dictation errors.    Alfred Levins, [place], MD 05/30/19 0157

## 2019-05-29 NOTE — ED Triage Notes (Signed)
Pt presents to ED with severe upper back pain for the past week. Worse today. Hx of 4 kyphoplasty surgeries after a fall with 5 vertebral fx in 2019. Pt states pain has gotten so severe that she is having difficulty breathing. Pt states she thinks one of her surgically repaired "vertebrae has collapsed". Pt states it feels like sharp pressure in her back that radiates around to the sides of her ribs and squeezes making it difficult to take in a deep breath. Marland Kitchen

## 2019-05-30 ENCOUNTER — Emergency Department: Payer: Medicare Other

## 2019-05-30 ENCOUNTER — Encounter: Payer: Self-pay | Admitting: Radiology

## 2019-05-30 DIAGNOSIS — S22020A Wedge compression fracture of second thoracic vertebra, initial encounter for closed fracture: Secondary | ICD-10-CM | POA: Diagnosis not present

## 2019-05-30 LAB — TROPONIN I (HIGH SENSITIVITY): Troponin I (High Sensitivity): 7 ng/L (ref <18)

## 2019-05-30 MED ORDER — OXYCODONE HCL 5 MG PO TABS: 5.0000 mg | ORAL_TABLET | Freq: Three times a day (TID) | ORAL | 0 refills | Status: DC | PRN

## 2019-05-30 MED ORDER — IOHEXOL 350 MG/ML SOLN
75.0000 mL | Freq: Once | INTRAVENOUS | Status: AC | PRN
  Administered 2019-05-30: 75 mL via INTRAVENOUS

## 2019-05-30 MED ORDER — OXYCODONE HCL 5 MG PO TABS
5.0000 mg | ORAL_TABLET | Freq: Once | ORAL | Status: DC
  Filled 2019-05-30: qty 1

## 2019-05-30 MED ORDER — SENNA 8.6 MG PO TABS: 1.0000 | ORAL_TABLET | Freq: Every day | ORAL | 0 refills | Status: AC

## 2019-05-30 MED ORDER — ONDANSETRON 4 MG PO TBDP: 4.0000 mg | ORAL_TABLET | Freq: Three times a day (TID) | ORAL | 0 refills | Status: DC | PRN

## 2019-05-30 MED ORDER — DOCUSATE SODIUM 100 MG PO CAPS: 100.0000 mg | ORAL_CAPSULE | Freq: Two times a day (BID) | ORAL | 0 refills | Status: AC

## 2019-05-30 MED ORDER — LIDOCAINE 5 % EX PTCH
1.0000 | MEDICATED_PATCH | CUTANEOUS | Status: DC
  Administered 2019-05-30: 1 via TRANSDERMAL
  Filled 2019-05-30: qty 1

## 2019-05-30 NOTE — ED Notes (Signed)
Pt arrived back from CT to room 05.

## 2019-05-30 NOTE — ED Notes (Signed)
Pt transported to CT ?

## 2019-05-30 NOTE — Discharge Instructions (Signed)
Your CT shows new compression fractures of T2, T4, and T9 with no retropulsion.   Pain control: Take tylenol 1000mg  every 8 hours. Replace lidoderm patch every 12 hours. Take 5mg  of oxycodone every 6 hours for breakthrough pain. If you need the oxycodone make sure to take one colace and one senokot at bedtime as well to prevent constipation.  Do not drink alcohol, drive or participate in any other potentially dangerous activities while taking this medication as it may make you sleepy. Do not take this medication with any other sedating medications, either prescription or over-the-counter.  Follow up with Dr. Rudene Christians. Return to the ER for new or worsening back pain, weakness or numbness of your extremities, numbness of your groin, urinary or bowel incontinence or retention.

## 2019-05-31 ENCOUNTER — Ambulatory Visit
Admission: RE | Admit: 2019-05-31 | Discharge: 2019-05-31 | Disposition: A | Discharge status: Home / Self Care | Payer: Medicare Other | Source: Ambulatory Visit | Attending: Orthopedic Surgery | Admitting: Orthopedic Surgery

## 2019-05-31 ENCOUNTER — Other Ambulatory Visit: Payer: Self-pay | Admitting: Orthopedic Surgery

## 2019-05-31 ENCOUNTER — Other Ambulatory Visit: Payer: Self-pay

## 2019-05-31 DIAGNOSIS — S22000A Wedge compression fracture of unspecified thoracic vertebra, initial encounter for closed fracture: Secondary | ICD-10-CM | POA: Insufficient documentation

## 2019-06-05 ENCOUNTER — Other Ambulatory Visit: Payer: Self-pay | Admitting: Orthopedic Surgery

## 2019-06-05 DIAGNOSIS — M5412 Radiculopathy, cervical region: Secondary | ICD-10-CM

## 2019-06-06 NOTE — Progress Notes (Signed)
[facility]  [address] [place], [place] [address] Phone: [phone]  Fax: [phone]   Telemedicine Office Visit:  06/08/2019  Referring physician: Kirk Ruths, MD  I connected with Delbert Phenix on 06/08/2019 at 9:02 AM by videoconferencing and verified that I was speaking with the correct person using 2 identifiers.  The patient was at home.  I discussed the limitations, risk, security and privacy concerns of performing an evaluation and management service by videoconferencing and the availability of in person appointments.  I also discussed with the patient that there may be a patient responsible charge related to this service.  The patient expressed understanding and agreed to proceed.   Chief Complaint: Ashley Mack is a 74 y.o. female with iron deficiency anemia and B12 deficiency who is seen for a 9 month assessment.  HPI: The patient was last seen in the hematology clinic on 08/31/2018. At that time, she felt fatigued. She was eating well. She denied any bleeding. Exam was stable. Hematocrit 33.1, hemoglobin 10.8, MCV 89.5, platelets 392,000, WBC 12,400 (ANC 10,400). Ferritin 35.0. Folate 12.8. Patient received Venofer.   She received Venofer weekly x 2 (08/31/2018 - 09/07/2018).  Vitamin B12 was 506 on 09/07/2018.   Patient was seen in GI by Dr. Gustavo Lah on 11/03/2018. Patient was doing well. Patient was asymptomatic. Exam was stable.    Patient was last seen in the hematology clinic on 03/02/2019 by Beckey Rutter, NP. At that time, patient felt fatigued and mild shortness of breath. She suspected that her iron was low based on history of the symptoms. She denied any frank GI bleeding. Exam was stable and unremarkable.  Patient received Venofer x 4 (03/02/2019 - 03/23/2019).   Patient was seen by Dr. Sharlet Salina' nurse practitioner. Whitney Meeler, on 06/02/2019. She had moderate to severe with diffuse tenderness upon palpation to the  upper thoracic spine with radiation. She had no weakness noted of the upper extremities.  Patient was using lidocaine patches with mild benefit. Patient continued pain medication. She was given a prednisone 10 mg 6 day taper.   Labs followed:  12/01/2018:  Hematocrit 34.7, hemoglobin 11.2, MCV 88.3, platelets 420,000, WBC 137,000 ([place] 10,700). Ferritin 32. TSH 4.603. Free T4 0.90.  03/01/2019:  Hematocrit 32.1, hemoglobin 10.2, MCV 90.4, platelets 446,000, WBC 13,000 (ANC 10,600). Ferritin 20. Folate 9.5.  06/07/2019:  Hematocrit 30.8, hemoglobin 9.7, MCV 89.5, platelets 491,000, WBC 15,500 (ANC 12,800). Ferritin 60. Folate 8.0.  Patient tested positive for COVID-19 on 04/28/2019. Patient received out patient IV antibody treatment.   During the interim, the patient has been doing ok. She continues to feel tired and have exertional shortness of breath. Patient reports having lower back pain (10/10). She reports the prednisone 10 mg 6 day taper did not bring much relief. Patient will follow up with Dr. Sharlet Salina next week along with a possible injection. She denies any vaginal or rectal bleeding. She denies any melena. She notes having a lot of inflammation.   Patient continues to do at home B12 injections. She notes that she will giving herself an injection today.   Past Medical History:  Diagnosis Date  . Anemia   . Anxiety   . Arthritis    Rheumatoid  . [place] of gallbladder   . 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 Centrilobular emphysema ([place])   . Dyspnea   . Fatty liver   . GERD (gastroesophageal reflux disease)   . History  of fractured vertebra    sees chiropractor  . 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 Iron (Fe) deficiency anemia   . Migraine headache    in past  . Migraines   . Motion sickness   . 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 Osteoporosis   . Rheumatoid arthritis ([place])   . Vertigo     Past Surgical History:  Procedure Laterality Date  .  CATARACT EXTRACTION W/PHACO Right 08/12/2016   Procedure: CATARACT EXTRACTION PHACO AND INTRAOCULAR LENS PLACEMENT ([place])  Right;  Surgeon: Leandrew Koyanagi, MD;  Location: [facility];  Service: Ophthalmology;  Laterality: Right;  . CATARACT EXTRACTION W/PHACO Left 09/30/2016   Procedure: CATARACT EXTRACTION PHACO AND INTRAOCULAR LENS PLACEMENT ([place]) Left;  Surgeon: Leandrew Koyanagi, MD;  Location: [facility];  Service: Ophthalmology;  Laterality: Left;  . COLONOSCOPY    . COLONOSCOPY WITH PROPOFOL N/A 12/09/2016   Procedure: COLONOSCOPY WITH PROPOFOL;  Surgeon: Toledo, Benay Pike, MD;  Location: [facility];  Service: Endoscopy;  Laterality: N/A;  . ESOPHAGOGASTRODUODENOSCOPY    . ESOPHAGOGASTRODUODENOSCOPY (EGD) WITH PROPOFOL N/A 12/09/2016   Procedure: ESOPHAGOGASTRODUODENOSCOPY (EGD) WITH PROPOFOL;  Surgeon: Toledo, Benay Pike, MD;  Location: [facility];  Service: Endoscopy;  Laterality: N/A;  . KYPHOPLASTY N/A 08/10/2017   Procedure: Hewitt Shorts;  Surgeon: Hessie Knows, MD;  Location: [facility];  Service: Orthopedics;  Laterality: N/A;  . KYPHOPLASTY N/A 09/09/2017   Procedure: [id number];  Surgeon: Hessie Knows, MD;  Location: [facility];  Service: Orthopedics;  Laterality: N/A;  . KYPHOPLASTY N/A 09/28/2017   Procedure: [id number];  Surgeon: Hessie Knows, MD;  Location: [facility];  Service: Orthopedics;  Laterality: N/A;  . OOPHORECTOMY Bilateral 2003    Family History  Problem Relation Age of Onset  . Kidney disease Mother   . Diabetes Mellitus II Brother     Social History:  reports that she quit smoking about 12 years ago. Her smoking use included cigarettes. She smoked 1.00 pack per day. She has never used smokeless tobacco. She reports that she does not drink alcohol or use drugs. She quit smoking in 2009. She lives in [place]. Her step daughter is Museum/gallery conservator. The patient is accompanied by [employer] today.  Participants in the patient's visit and  their role in the encounter included the patient, Amber, and [employer], CMA, today.  The intake visit was provided by Vito Berger, CMA.   Allergies:  Allergies  Allergen Reactions  . Methotrexate Derivatives Nausea And Vomiting and Other (See Comments)    Flu like symptoms  . Other Nausea Only and Other (See Comments)    Arthritis medications . Unsure of what the med was.  . Ranitidine Hives  . Sulfasalazine Other (See Comments)    Stomach upset. Unsure of reaction. Long time ago  . Topiramate Nausea Only    Current Medications: Current Outpatient Medications  Medication Sig Dispense Refill  . budesonide-formoterol (SYMBICORT) 80-4.5 MCG/ACT inhaler Inhale 2 puffs into the lungs every morning.     . Cholecalciferol (VITAMIN D3) 2000 UNITS capsule Take 2,000 Units by mouth daily.     . cyanocobalamin (,VITAMIN B-12,) 1000 MCG/ML injection Inject 1 mL (1,000 mcg total) into the muscle every 30 (thirty) days. 1 mL 3  . cyclobenzaprine (FLEXERIL) 10 MG tablet 1/2-1 po tid prn    . DEXILANT 60 MG capsule Take 60 mg by mouth daily.     . Fe Fum-FePoly-Vit C-Vit B3 (INTEGRA) 62.5-62.5-40-3 MG CAPS Take 1 capsule by mouth  daily.    . fluticasone (FLONASE) 50 MCG/ACT nasal spray Place 1-2 sprays into both nostrils daily as needed for allergies or rhinitis.     Marland Kitchen oxyCODONE (ROXICODONE) 5 MG immediate release tablet Take 1 tablet (5 mg total) by mouth every 8 (eight) hours as needed. 12 tablet 0  . predniSONE (DELTASONE) 5 MG tablet Take 5 mg by mouth daily.     . ondansetron (ZOFRAN ODT) 4 MG disintegrating tablet Take 1 tablet (4 mg total) by mouth every 8 (eight) hours as needed. (Patient not taking: Reported on 06/07/2019) 20 tablet 0   No current facility-administered medications for this visit.    Review of Systems  Constitutional: Positive for malaise/fatigue. Negative for chills, diaphoresis, fever and weight loss.       Doing ok.  HENT: Negative.  Negative for  congestion, hearing loss, sinus pain and sore throat.   Eyes: Negative.  Negative for blurred vision.  Respiratory: Positive for shortness of breath (exertional). Negative for cough and wheezing.   Cardiovascular: Negative.  Negative for chest pain, palpitations, orthopnea, leg swelling and PND.  Gastrointestinal: Negative.  Negative for abdominal pain, blood in stool, constipation, diarrhea, melena, nausea and vomiting.  Genitourinary: Negative.  Negative for dysuria, frequency, hematuria and urgency.  Musculoskeletal: Positive for back pain (lower back (10/10)). Negative for joint pain and myalgias.       Patient s/p 5 kyphoplasties since 04/2017.  Skin: Negative.  Negative for rash.       Fragile skin.  Neurological: Negative.  Negative for dizziness, tingling, sensory change, weakness and headaches.  Endo/Heme/Allergies: Negative.  Does not bruise/bleed easily.  Psychiatric/Behavioral: Negative.  Negative for depression and memory loss. The patient is not nervous/anxious and does not have insomnia.   All other systems reviewed and are negative.  Performance status (ECOG): 1  Physical Exam  Constitutional: She is oriented to person, place, and time. She appears well-developed and well-nourished. No distress.  HENT:  Head: Normocephalic and atraumatic.  Short brown hair.  Eyes: Conjunctivae and EOM are normal. No scleral icterus.  Glasses.  Blue eyes.   Neurological: She is alert and oriented to person, place, and time.  Skin: She is not diaphoretic.  Psychiatric: She has a normal mood and affect. Her behavior is normal. Judgment and thought content normal.  Nursing note reviewed.    Appointment on 06/07/2019  Component Date Value Ref Range Status  . Folate 06/07/2019 8.0  >5.9 ng/mL Final   Performed at [facility], [address] [place], [place] [address]  . Ferritin 06/07/2019 60  11 - 307 ng/mL Final   Performed at [facility], [address]  [place], [place] [address]  . WBC 06/07/2019 15.5* 4.0 - 10.5 K/uL Final  . RBC 06/07/2019 3.44* 3.87 - 5.11 MIL/uL Final  . Hemoglobin 06/07/2019 9.7* 12.0 - 15.0 g/dL Final  . HCT 06/07/2019 30.8* 36.0 - 46.0 % Final  . MCV 06/07/2019 89.5  80.0 - 100.0 fL Final  . MCH 06/07/2019 28.2  26.0 - 34.0 pg Final  . MCHC 06/07/2019 31.5  30.0 - 36.0 g/dL Final  . RDW 06/07/2019 16.4* 11.5 - 15.5 % Final  . Platelets 06/07/2019 491* 150 - 400 K/uL Final  . nRBC 06/07/2019 0.1  0.0 - 0.2 % Final  . Neutrophils Relative % 06/07/2019 83  % Final  . Neutro Abs 06/07/2019 12.8* 1.7 - 7.7 K/uL Final  . Lymphocytes Relative 06/07/2019 9  % Final  . Lymphs  Abs 06/07/2019 1.5  0.7 - 4.0 K/uL Final  . Monocytes Relative 06/07/2019 6  % Final  . Monocytes Absolute 06/07/2019 0.9  0.1 - 1.0 K/uL Final  . Eosinophils Relative 06/07/2019 0  % Final  . Eosinophils Absolute 06/07/2019 0.0  0.0 - 0.5 K/uL Final  . Basophils Relative 06/07/2019 0  % Final  . Basophils Absolute 06/07/2019 0.0  0.0 - 0.1 K/uL Final  . Immature Granulocytes 06/07/2019 2  % Final  . Abs Immature Granulocytes 06/07/2019 0.35* 0.00 - 0.07 K/uL Final   Performed at [facility], [address] [place], [place] [address]    Assessment:  DKAYLA MCNINCH is a 74 y.o. female with iron deficiency anemiaand B12 deficiency. Dietis good. She was on oral iron(Integra) for years. She is unsure of any melena or hematochezia as she can not see well.   Work-up on 06/18/2018revealed a hematocrit 30.6, hemoglobin 9.9, and MCV 83.2. B12 was 90. Ferritin was 13. Sed rate was 45 (elevated). Retic was 2.6%. Normal studiesincluded the following: [place], folate, iron saturation, TIBC, SPEP, and free light chain ratio. Normal studieson 07/29/2016 included: creatinine, calcium, albumin, protein, liver function tests, and TSH.  She had acolonoscopyon 03/18/2009. She has had several EGDswith the last on 10/12/2008. She states  that she has had polyps, reflux and a hiatal hernia. EGD on 12/09/2016 was normal, other than demonstrating a small hiatal hernia. Colonoscopyon 12/09/2016 demonstrated friability with contact bleeding in the proximal transverse colon. A 1 mm sessile polyp was found in the sigmoid colon, in addition to grade II non-bleeding internal hemorrhoids were also noted. Pathology results demonstrated a tubular adenoma that was negative for dyplasia or malignancy. 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   She began weekly B12on 09/08/2016 and continued x 6 weeks (last 10/16/2016). She began monthly B12on 11/26/2016 (last 02/17/2018). Folate was 9.5 on 03/01/2019.  She received Venoferon 09/08/2016, 09/15/2016, 09/25/2016, 11/26/2016, 12/16/2016, 12/24/2016, x3 (05/27/2017 - 06/10/2017), x2 (03/03/2018 - 03/10/2018), x3 (06/22/2018 - 07/06/2018), x 2 (08/31/2018 - 09/07/2018) and x 4 (03/02/2019 - 03/23/2019). She receives Venofer if her ferritin is < 30.  Ferritinhas been followed: 16 on 11/26/2016, 34 on 03/01/2017, 15 on 05/26/2017, 59 on 07/08/2017, 56 on 08/25/2017, 15 on 02/28/2018, 17 on 06/21/2018, 35 on 08/30/2018, 32 on 12/01/2018, 20 on 03/01/2019, and 60 on 06/07/2019.   She has rheumatoid arthritis. She has been treated with methotrexate, Imuran, leflunomide, and sulfasalazine. She is on prednisone 5 mg a day x 2 years. She is s/p kyphoplasty x 5 since 04/2017.  She tested positive for COVID-19 on 04/28/2019.   Symptomatically, she feels feel tired and has exertional shortness of breath.  She denies any bleeding.  Plan: 1.  Review labs from 06/07/2019. 2.Iron deficiency Hematocrit 30.8.  Hemoglobin 9.7.  MCV 89.5.  Ferritin 60 with an iron saturation of 8% (low) and a TIBC 323.  Patient receives Venofer if her ferritin < 30.             No Venofer today.  Review interval GI note.  Additional labs added today. 3. B12  deficiency Patient feels good after receiving B12.             She received B12 around the first of this month/last week. B12 is due today.  Folate 8.0 today.  Continue to monitor periodically. 4.   RTC in 1 month for labs (CBC with diff, ferritin).   5.   RTC in 2 months for MD assessment,  labs (CBC with diff, ferritin-day before) and +/- Venofer.   I discussed the assessment and treatment plan with the patient.  The patient was provided an opportunity to ask questions and all were answered.  The patient agreed with the plan and demonstrated an understanding of the instructions.  The patient was advised to call back if the symptoms worsen or if the condition fails to improve as anticipated.  I provided 12 minutes (9:02 AM - 9:14 AM) of face-to-face video visit time during this this encounter and > 50% was spent counseling as documented under my assessment and plan.  An additional 8-9 minutes were spent reviewing her chart (Epic and Care Everywhere) including notes, labs, and imaging studies. I provided these services from the [facility] office.    Lequita Asal, MD, PhD    06/08/2019, 9:02 AM  I, Selena Batten, am acting as scribe for [employer]. Mike Gip, MD, PhD.  I, Danelia Snodgrass C. Mike Gip, MD, have reviewed the above documentation for accuracy and completeness, and I agree with the above.

## 2019-06-07 ENCOUNTER — Other Ambulatory Visit: Payer: Self-pay

## 2019-06-07 ENCOUNTER — Encounter: Payer: Self-pay | Admitting: Hematology and Oncology

## 2019-06-07 ENCOUNTER — Inpatient Hospital Stay: Payer: Medicare Other | Attending: Hematology and Oncology

## 2019-06-07 DIAGNOSIS — E538 Deficiency of other specified B group vitamins: Secondary | ICD-10-CM | POA: Diagnosis not present

## 2019-06-07 DIAGNOSIS — D509 Iron deficiency anemia, unspecified: Secondary | ICD-10-CM | POA: Diagnosis not present

## 2019-06-07 LAB — CBC WITH DIFFERENTIAL/PLATELET
Abs Immature Granulocytes: 0.35 10*3/uL — ABNORMAL HIGH (ref 0.00–0.07)
Basophils Absolute: 0 10*3/uL (ref 0.0–0.1)
Basophils Relative: 0 %
Eosinophils Absolute: 0 10*3/uL (ref 0.0–0.5)
Eosinophils Relative: 0 %
HCT: 30.8 % — ABNORMAL LOW (ref 36.0–46.0)
Hemoglobin: 9.7 g/dL — ABNORMAL LOW (ref 12.0–15.0)
Immature Granulocytes: 2 %
Lymphocytes Relative: 9 %
Lymphs Abs: 1.5 10*3/uL (ref 0.7–4.0)
MCH: 28.2 pg (ref 26.0–34.0)
MCHC: 31.5 g/dL (ref 30.0–36.0)
MCV: 89.5 fL (ref 80.0–100.0)
Monocytes Absolute: 0.9 10*3/uL (ref 0.1–1.0)
Monocytes Relative: 6 %
Neutro Abs: 12.8 10*3/uL — ABNORMAL HIGH (ref 1.7–7.7)
Neutrophils Relative %: 83 %
Platelets: 491 10*3/uL — ABNORMAL HIGH (ref 150–400)
RBC: 3.44 MIL/uL — ABNORMAL LOW (ref 3.87–5.11)
RDW: 16.4 % — ABNORMAL HIGH (ref 11.5–15.5)
WBC: 15.5 10*3/uL — ABNORMAL HIGH (ref 4.0–10.5)
nRBC: 0.1 % (ref 0.0–0.2)

## 2019-06-07 LAB — FOLATE: Folate: 8 ng/mL (ref >5.9)

## 2019-06-07 LAB — FERRITIN: Ferritin: 60 ng/mL (ref 11–307)

## 2019-06-07 NOTE — Progress Notes (Signed)
The patient c/o lower back pain ( 10) today. The patient name and DOB has been verified by phone today.

## 2019-06-08 ENCOUNTER — Other Ambulatory Visit: Payer: Self-pay

## 2019-06-08 ENCOUNTER — Inpatient Hospital Stay (HOSPITAL_BASED_OUTPATIENT_CLINIC_OR_DEPARTMENT_OTHER): Payer: Medicare Other | Admitting: Hematology and Oncology

## 2019-06-08 ENCOUNTER — Encounter: Payer: Self-pay | Admitting: Hematology and Oncology

## 2019-06-08 DIAGNOSIS — D509 Iron deficiency anemia, unspecified: Secondary | ICD-10-CM | POA: Diagnosis not present

## 2019-06-08 DIAGNOSIS — E538 Deficiency of other specified B group vitamins: Secondary | ICD-10-CM | POA: Diagnosis not present

## 2019-06-08 LAB — IRON AND TIBC
Iron: 25 ug/dL — ABNORMAL LOW (ref 28–170)
Saturation Ratios: 8 % — ABNORMAL LOW (ref 10.4–31.8)
TIBC: 323 ug/dL (ref 250–450)
UIBC: 298 ug/dL

## 2019-06-16 ENCOUNTER — Emergency Department: Payer: Medicare Other

## 2019-06-16 ENCOUNTER — Inpatient Hospital Stay
Admission: EM | Admit: 2019-06-16 | Discharge: 2019-06-19 | DRG: 517 | Disposition: A | Discharge status: Home / Self Care | Payer: Medicare Other | Attending: Hospitalist | Admitting: Hospitalist

## 2019-06-16 ENCOUNTER — Other Ambulatory Visit: Payer: Self-pay

## 2019-06-16 DIAGNOSIS — M810 Age-related osteoporosis without current pathological fracture: Secondary | ICD-10-CM | POA: Diagnosis present

## 2019-06-16 DIAGNOSIS — K219 Gastro-esophageal reflux disease without esophagitis: Secondary | ICD-10-CM | POA: Diagnosis present

## 2019-06-16 DIAGNOSIS — Z7951 Long term (current) use of inhaled steroids: Secondary | ICD-10-CM

## 2019-06-16 DIAGNOSIS — Z7952 Long term (current) use of systemic steroids: Secondary | ICD-10-CM | POA: Diagnosis not present

## 2019-06-16 DIAGNOSIS — S22050A Wedge compression fracture of T5-T6 vertebra, initial encounter for closed fracture: Secondary | ICD-10-CM | POA: Diagnosis present

## 2019-06-16 DIAGNOSIS — S22000A Wedge compression fracture of unspecified thoracic vertebra, initial encounter for closed fracture: Secondary | ICD-10-CM | POA: Diagnosis not present

## 2019-06-16 DIAGNOSIS — K760 Fatty (change of) liver, not elsewhere classified: Secondary | ICD-10-CM | POA: Diagnosis present

## 2019-06-16 DIAGNOSIS — Z20822 Contact with and (suspected) exposure to covid-19: Secondary | ICD-10-CM | POA: Diagnosis present

## 2019-06-16 DIAGNOSIS — M069 Rheumatoid arthritis, unspecified: Secondary | ICD-10-CM | POA: Diagnosis present

## 2019-06-16 DIAGNOSIS — Z79899 Other long term (current) drug therapy: Secondary | ICD-10-CM | POA: Diagnosis not present

## 2019-06-16 DIAGNOSIS — Z419 Encounter for procedure for purposes other than remedying health state, unspecified: Secondary | ICD-10-CM

## 2019-06-16 DIAGNOSIS — M549 Dorsalgia, unspecified: Secondary | ICD-10-CM | POA: Diagnosis present

## 2019-06-16 DIAGNOSIS — J432 Centrilobular emphysema: Secondary | ICD-10-CM | POA: Diagnosis present

## 2019-06-16 DIAGNOSIS — Z87891 Personal history of nicotine dependence: Secondary | ICD-10-CM

## 2019-06-16 DIAGNOSIS — Z9889 Other specified postprocedural states: Secondary | ICD-10-CM

## 2019-06-16 DIAGNOSIS — K5909 Other constipation: Secondary | ICD-10-CM | POA: Diagnosis present

## 2019-06-16 DIAGNOSIS — M546 Pain in thoracic spine: Secondary | ICD-10-CM

## 2019-06-16 LAB — CBC
HCT: 28.1 % — ABNORMAL LOW (ref 36.0–46.0)
Hemoglobin: 8.7 g/dL — ABNORMAL LOW (ref 12.0–15.0)
MCH: 28.8 pg (ref 26.0–34.0)
MCHC: 31 g/dL (ref 30.0–36.0)
MCV: 93 fL (ref 80.0–100.0)
Platelets: 520 10*3/uL — ABNORMAL HIGH (ref 150–400)
RBC: 3.02 MIL/uL — ABNORMAL LOW (ref 3.87–5.11)
RDW: 17.2 % — ABNORMAL HIGH (ref 11.5–15.5)
WBC: 16.4 10*3/uL — ABNORMAL HIGH (ref 4.0–10.5)
nRBC: 0 % (ref 0.0–0.2)

## 2019-06-16 LAB — HEPATIC FUNCTION PANEL
ALT: 18 U/L (ref 0–44)
AST: 21 U/L (ref 15–41)
Albumin: 3.7 g/dL (ref 3.5–5.0)
Alkaline Phosphatase: 59 U/L (ref 38–126)
Bilirubin, Direct: <0.1 mg/dL (ref 0.0–0.2)
Total Bilirubin: 0.5 mg/dL (ref 0.3–1.2)
Total Protein: 6.7 g/dL (ref 6.5–8.1)

## 2019-06-16 LAB — BASIC METABOLIC PANEL
Anion gap: 9 (ref 5–15)
BUN: 16 mg/dL (ref 8–23)
CO2: 27 mmol/L (ref 22–32)
Calcium: 8.7 mg/dL — ABNORMAL LOW (ref 8.9–10.3)
Chloride: 97 mmol/L — ABNORMAL LOW (ref 98–111)
Creatinine, Ser: 0.94 mg/dL (ref 0.44–1.00)
GFR calc Af Amer: >60 mL/min (ref >60)
GFR calc non Af Amer: 59 mL/min — ABNORMAL LOW (ref >60)
Glucose, Bld: 123 mg/dL — ABNORMAL HIGH (ref 70–99)
Potassium: 4.1 mmol/L (ref 3.5–5.1)
Sodium: 133 mmol/L — ABNORMAL LOW (ref 135–145)

## 2019-06-16 LAB — TROPONIN I (HIGH SENSITIVITY): Troponin I (High Sensitivity): 9 ng/L (ref <18)

## 2019-06-16 LAB — LIPASE, BLOOD: Lipase: 25 U/L (ref 11–51)

## 2019-06-16 MED ORDER — POLYETHYLENE GLYCOL 3350 17 G PO PACK
17.0000 g | PACK | Freq: Two times a day (BID) | ORAL | Status: DC
  Administered 2019-06-17 – 2019-06-18 (×3): 17 g via ORAL
  Filled 2019-06-16 (×3): qty 1

## 2019-06-16 MED ORDER — CALCIUM CARBONATE ANTACID 500 MG PO CHEW: 1.0000 | CHEWABLE_TABLET | Freq: Three times a day (TID) | ORAL | Status: DC | PRN

## 2019-06-16 MED ORDER — ACETAMINOPHEN 500 MG PO TABS
1000.0000 mg | ORAL_TABLET | Freq: Three times a day (TID) | ORAL | Status: DC | PRN
  Filled 2019-06-16: qty 2

## 2019-06-16 MED ORDER — HYDROMORPHONE HCL 1 MG/ML IJ SOLN
1.0000 mg | Freq: Once | INTRAMUSCULAR | Status: AC
  Administered 2019-06-16: 15:00:00 1 mg via INTRAVENOUS
  Filled 2019-06-16: qty 1

## 2019-06-16 MED ORDER — OXYCODONE HCL 5 MG PO TABS: 10.0000 mg | ORAL_TABLET | ORAL | Status: DC | PRN

## 2019-06-16 MED ORDER — ONDANSETRON HCL 4 MG/2ML IJ SOLN: 4.0000 mg | Freq: Four times a day (QID) | INTRAMUSCULAR | Status: DC | PRN

## 2019-06-16 MED ORDER — ONDANSETRON HCL 4 MG/2ML IJ SOLN
4.0000 mg | Freq: Once | INTRAMUSCULAR | Status: AC
  Administered 2019-06-16: 4 mg via INTRAVENOUS
  Filled 2019-06-16: qty 2

## 2019-06-16 MED ORDER — FLUTICASONE FUROATE-VILANTEROL 100-25 MCG/INH IN AEPB
1.0000 | INHALATION_SPRAY | Freq: Every day | RESPIRATORY_TRACT | Status: DC
  Administered 2019-06-17 – 2019-06-18 (×2): 1 via RESPIRATORY_TRACT
  Filled 2019-06-16: qty 28

## 2019-06-16 MED ORDER — MORPHINE SULFATE (PF) 4 MG/ML IV SOLN: 4.0000 mg | INTRAVENOUS | Status: DC | PRN

## 2019-06-16 MED ORDER — MORPHINE SULFATE (PF) 2 MG/ML IV SOLN
2.0000 mg | INTRAVENOUS | Status: DC | PRN
  Administered 2019-06-16 – 2019-06-19 (×5): 2 mg via INTRAVENOUS
  Filled 2019-06-16 (×5): qty 1

## 2019-06-16 MED ORDER — GUAIFENESIN-DM 100-10 MG/5ML PO SYRP
10.0000 mL | ORAL_SOLUTION | Freq: Four times a day (QID) | ORAL | Status: DC | PRN
  Filled 2019-06-16: qty 10

## 2019-06-16 MED ORDER — PREDNISONE 10 MG PO TABS
5.0000 mg | ORAL_TABLET | Freq: Every day | ORAL | Status: DC
  Administered 2019-06-17 – 2019-06-18 (×2): 5 mg via ORAL
  Filled 2019-06-16 (×2): qty 1

## 2019-06-16 MED ORDER — SODIUM CHLORIDE 0.9% FLUSH: 3.0000 mL | Freq: Once | INTRAVENOUS | Status: DC

## 2019-06-16 MED ORDER — ACETAMINOPHEN 500 MG PO TABS
1000.0000 mg | ORAL_TABLET | Freq: Once | ORAL | Status: AC
  Administered 2019-06-16: 1000 mg via ORAL
  Filled 2019-06-16: qty 2

## 2019-06-16 MED ORDER — ENOXAPARIN SODIUM 40 MG/0.4ML ~~LOC~~ SOLN
40.0000 mg | SUBCUTANEOUS | Status: DC
  Administered 2019-06-17: 40 mg via SUBCUTANEOUS
  Filled 2019-06-16: qty 0.4

## 2019-06-16 MED ORDER — LIDOCAINE 5 % EX PTCH
1.0000 | MEDICATED_PATCH | CUTANEOUS | Status: DC
  Administered 2019-06-17: 1 via TRANSDERMAL
  Filled 2019-06-16 (×3): qty 1

## 2019-06-16 MED ORDER — DOCUSATE SODIUM 100 MG PO CAPS: 100.0000 mg | ORAL_CAPSULE | Freq: Two times a day (BID) | ORAL | Status: DC | PRN

## 2019-06-16 MED ORDER — ALUM & MAG HYDROXIDE-SIMETH 200-200-20 MG/5ML PO SUSP: 15.0000 mL | Freq: Four times a day (QID) | ORAL | Status: DC | PRN

## 2019-06-16 MED ORDER — OXYCODONE HCL 5 MG PO TABS
5.0000 mg | ORAL_TABLET | ORAL | Status: DC | PRN
  Administered 2019-06-17 – 2019-06-18 (×8): 5 mg via ORAL
  Filled 2019-06-16 (×10): qty 1

## 2019-06-16 MED ORDER — CYCLOBENZAPRINE HCL 10 MG PO TABS
10.0000 mg | ORAL_TABLET | Freq: Three times a day (TID) | ORAL | Status: DC | PRN
  Administered 2019-06-17 (×2): 10 mg via ORAL
  Filled 2019-06-16 (×2): qty 1

## 2019-06-16 MED ORDER — IOHEXOL 350 MG/ML SOLN
75.0000 mL | Freq: Once | INTRAVENOUS | Status: AC | PRN
  Administered 2019-06-16: 13:00:00 75 mL via INTRAVENOUS

## 2019-06-16 MED ORDER — HYDROMORPHONE HCL 1 MG/ML IJ SOLN
0.5000 mg | Freq: Once | INTRAMUSCULAR | Status: AC
  Administered 2019-06-16: 0.5 mg via INTRAVENOUS
  Filled 2019-06-16: qty 1

## 2019-06-16 MED ORDER — PANTOPRAZOLE SODIUM 40 MG PO TBEC
40.0000 mg | DELAYED_RELEASE_TABLET | Freq: Every day | ORAL | Status: DC
  Administered 2019-06-17 – 2019-06-18 (×2): 40 mg via ORAL
  Filled 2019-06-16 (×2): qty 1

## 2019-06-16 MED ORDER — ONDANSETRON HCL 4 MG/2ML IJ SOLN
4.0000 mg | Freq: Once | INTRAMUSCULAR | Status: AC
  Administered 2019-06-16: 15:00:00 4 mg via INTRAVENOUS
  Filled 2019-06-16: qty 2

## 2019-06-16 MED ORDER — ONDANSETRON 4 MG PO TBDP
4.0000 mg | ORAL_TABLET | Freq: Three times a day (TID) | ORAL | Status: DC | PRN
  Filled 2019-06-16: qty 1

## 2019-06-16 NOTE — ED Notes (Signed)
Report given to Michelle RN

## 2019-06-16 NOTE — ED Notes (Signed)
Family brought [employer] for patient. Patient is tolerating food well. Patient declined pain meds at this time.

## 2019-06-16 NOTE — ED Triage Notes (Addendum)
Pt comes via POV from [facility] with c/o lower back pain and SOB. Pt states this started this am and has gotten worse.  Pt has labored breathing with accessory muscle use.  Pt denies any N/V/D and chest pain. Pt states she just can't get a deep breath.  Pt states rib pain all the way around

## 2019-06-16 NOTE — ED Provider Notes (Signed)
[facility] Emergency Department Provider Note  ____________________________________________   First MD Initiated Contact with Patient 06/16/19 1227     (approximate)  I have reviewed the triage vital signs and the nursing notes.   HISTORY  Chief Complaint Shortness of Breath    HPI KEYSA TROOP is a 74 y.o. female with history rheumatoid arthritis, emphysema who comes in with chest and back pain for rule out PE.  Patient states that she has had chronic back pain for some time now that she sees orthopedics for.  Recently had an MRI done.  However she reports worsening back pain that started yesterday, severe, constant, worse with taking a deep breath, not better with her home oxycodone, Flexeril, steroids.  Patient was sent here for evaluation for PE.          Past Medical History:  Diagnosis Date  . Anemia   . Anxiety   . Arthritis    Rheumatoid  . [place] of gallbladder   . 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 Centrilobular emphysema ([place])   . Dyspnea   . Fatty liver   . GERD (gastroesophageal reflux disease)   . History of fractured vertebra    sees chiropractor  . 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 Iron (Fe) deficiency anemia   . Migraine headache    in past  . Migraines   . Motion sickness   . 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 Osteoporosis   . Rheumatoid arthritis ([place])   . Vertigo     Patient Active Problem List   Diagnosis Date Noted  . Flank pain   . Intractable back pain 08/08/2017  . Compression fracture of lumbar vertebra ([place]) 08/08/2017  . Abdominal pain, RUQ 08/06/2017  . Fatigue 05/29/2017  . Exertional dyspnea 05/29/2017  . Guaiac positive stools 11/26/2016  . Iron deficiency anemia 09/01/2016  . B12 deficiency 08/31/2016  . Normocytic anemia 08/30/2016    Past Surgical History:  Procedure Laterality Date  . CATARACT EXTRACTION W/PHACO Right 08/12/2016   Procedure: CATARACT EXTRACTION  PHACO AND INTRAOCULAR LENS PLACEMENT ([place])  Right;  Surgeon: Leandrew Koyanagi, MD;  Location: [place];  Service: Ophthalmology;  Laterality: Right;  . CATARACT EXTRACTION W/PHACO Left 09/30/2016   Procedure: CATARACT EXTRACTION PHACO AND INTRAOCULAR LENS PLACEMENT ([place]) Left;  Surgeon: Leandrew Koyanagi, MD;  Location: [place];  Service: Ophthalmology;  Laterality: Left;  . COLONOSCOPY    . COLONOSCOPY WITH PROPOFOL N/A 12/09/2016   Procedure: COLONOSCOPY WITH PROPOFOL;  Surgeon: Toledo, Benay Pike, MD;  Location: [facility];  Service: Endoscopy;  Laterality: N/A;  . ESOPHAGOGASTRODUODENOSCOPY    . ESOPHAGOGASTRODUODENOSCOPY (EGD) WITH PROPOFOL N/A 12/09/2016   Procedure: ESOPHAGOGASTRODUODENOSCOPY (EGD) WITH PROPOFOL;  Surgeon: Toledo, Benay Pike, MD;  Location: [facility];  Service: Endoscopy;  Laterality: N/A;  . KYPHOPLASTY N/A 08/10/2017   Procedure: Hewitt Shorts;  Surgeon: Hessie Knows, MD;  Location: [facility];  Service: Orthopedics;  Laterality: N/A;  . KYPHOPLASTY N/A 09/09/2017   Procedure: [id number];  Surgeon: Hessie Knows, MD;  Location: [facility];  Service: Orthopedics;  Laterality: N/A;  . KYPHOPLASTY N/A 09/28/2017   Procedure: [id number];  Surgeon: Hessie Knows, MD;  Location: [facility];  Service: Orthopedics;  Laterality: N/A;  . OOPHORECTOMY Bilateral 2003    Prior to Admission medications   Medication Sig Start Date End Date Taking? Authorizing Provider  budesonide-formoterol (SYMBICORT) 80-4.5 MCG/ACT inhaler Inhale 2 puffs into the lungs  every morning.     [provider]  Cholecalciferol (VITAMIN D3) 2000 UNITS capsule Take 2,000 Units by mouth daily.     [provider]  cyanocobalamin (,VITAMIN B-12,) 1000 MCG/ML injection Inject 1 mL (1,000 mcg total) into the muscle every 30 (thirty) days. 04/17/19   Lequita Asal, MD  cyclobenzaprine (FLEXERIL) 10 MG tablet 1/2-1 po tid prn 01/25/18   [provider]  DEXILANT 60 MG capsule Take 60 mg by mouth daily.  08/04/14   [provider]  Fe Fum-FePoly-Vit C-Vit B3 (INTEGRA) 62.5-62.5-40-3 MG CAPS Take 1 capsule by mouth daily. 11/29/13   [provider]  fluticasone (FLONASE) 50 MCG/ACT nasal spray Place 1-2 sprays into both nostrils daily as needed for allergies or rhinitis.     [provider]  ondansetron (ZOFRAN ODT) 4 MG disintegrating tablet Take 1 tablet (4 mg total) by mouth every 8 (eight) hours as needed. Patient not taking: Reported on 06/07/2019 05/30/19   Rudene Re, MD  oxyCODONE (ROXICODONE) 5 MG immediate release tablet Take 1 tablet (5 mg total) by mouth every 8 (eight) hours as needed. 05/30/19 05/29/20  Rudene Re, MD  predniSONE (DELTASONE) 5 MG tablet Take 5 mg by mouth daily.     [provider]    Allergies Methotrexate derivatives, Other, Ranitidine, Sulfasalazine, and Topiramate  Family History  Problem Relation Age of Onset  . Kidney disease Mother   . Diabetes Mellitus II Brother     Social History Social History   Tobacco Use  . Smoking status: Former Smoker    Packs/day: 1.00    Types: Cigarettes    Quit date: 2009    Years since quitting: 12.2  . Smokeless tobacco: Never Used  Substance Use Topics  . Alcohol use: No  . Drug use: No      Review of Systems Constitutional: No fever/chills Eyes: No visual changes. ENT: No sore throat. Cardiovascular: No chest pain Respiratory: Positive for SOB and pain with breathing Gastrointestinal: No abdominal pain.  No nausea, no vomiting.  No diarrhea.  No constipation. Genitourinary: Negative for dysuria. Musculoskeletal: Thoracic back pain Skin: Negative for rash. Neurological: Negative for headaches, focal weakness or numbness. All other ROS negative ____________________________________________   PHYSICAL EXAM:  VITAL SIGNS: ED Triage Vitals [06/16/19 1214]  Enc Vitals Group     BP 138/76      Pulse Rate (!) 111     Resp 19     Temp 98.4 F (36.9 C)     Temp src      SpO2 98 %     Weight 150 lb (68 kg)     Height 5\' 2"  (1.575 m)     Head Circumference      Peak Flow      Pain Score 10     Pain Loc      Pain Edu?      Excl. in [place]?     Constitutional: Alert and oriented. Well appearing and in no acute distress. Eyes: Conjunctivae are normal. EOMI. Head: Atraumatic. Nose: No congestion/rhinnorhea. Mouth/Throat: Mucous membranes are moist.   Neck: No stridor. Trachea Midline. FROM Cardiovascular: Tachycardic, regular rhythm. Grossly normal heart sounds.  Good peripheral circulation. Respiratory: Clear lungs, pain with breathing Gastrointestinal: Soft and nontender. No distention. No abdominal bruits.  Musculoskeletal: No lower extremity tenderness nor edema.  No joint effusions. Neurologic:  Normal speech and language. No gross focal neurologic deficits are appreciated.  Skin:  Skin is warm,  dry and intact. No rash noted. Psychiatric: Mood and affect are normal. Speech and behavior are normal. GU: Deferred  Back: T-spine tenderness ____________________________________________   LABS (all labs ordered are listed, but only abnormal results are displayed)  Labs Reviewed  BASIC METABOLIC PANEL - Abnormal; Notable for the following components:      Result Value   Sodium 133 (*)    Chloride 97 (*)    Glucose, Bld 123 (*)    Calcium 8.7 (*)    GFR calc non Af Amer 59 (*)    All other components within normal limits  CBC - Abnormal; Notable for the following components:   WBC 16.4 (*)    RBC 3.02 (*)    Hemoglobin 8.7 (*)    HCT 28.1 (*)    RDW 17.2 (*)    Platelets 520 (*)    All other components within normal limits  HEPATIC FUNCTION PANEL  LIPASE, BLOOD  TROPONIN I (HIGH SENSITIVITY)   ____________________________________________   ED ECG REPORT I, Vanessa [place], the attending physician, personally viewed and interpreted this ECG.  EKG is sinus  tachycardia rate of 107, no ST elevation, no T wave inversions, normal intervals ____________________________________________  RADIOLOGY   Official radiology report(s): CT Angio Chest PE W and/or Wo Contrast  Result Date: 06/16/2019 CLINICAL DATA:  Shortness of breath. EXAM: CT ANGIOGRAPHY CHEST WITH CONTRAST TECHNIQUE: Multidetector CT imaging of the chest was performed using the standard protocol during bolus administration of intravenous contrast. Multiplanar CT image reconstructions and MIPs were obtained to evaluate the vascular anatomy. CONTRAST:  38mL OMNIPAQUE IOHEXOL 350 MG/ML SOLN COMPARISON:  CTA chest dated May 30, 2019. FINDINGS: Cardiovascular: Satisfactory opacification of the pulmonary arteries to the segmental level. No evidence of pulmonary embolism. Normal heart size. No pericardial effusion. No thoracic aortic aneurysm or dissection. Coronary, aortic arch, and branch vessel atherosclerotic vascular disease. Mediastinum/Nodes: No enlarged mediastinal, hilar, or axillary lymph nodes. Thyroid gland, trachea, and esophagus demonstrate no significant findings. Lungs/Pleura: Mild centrilobular emphysema again noted. No focal consolidation, pleural effusion, or pneumothorax. Minimal linear scarring/atelectasis at the lung bases. Upper Abdomen: No acute abnormality. Musculoskeletal: Multiple chronic thoracolumbar compression fractures again noted. Progressive now severe height loss of the T5 vertebral body when compared to recent thoracic spine MRI. Prior T11-L2 kyphoplasties. Review of the MIP images confirms the above findings. IMPRESSION: 1. No evidence of pulmonary embolism. No acute intrathoracic process. 2. Acute on chronic T5 compression fracture with progressive now severe height loss when compared to recent thoracic spine MRI. Multiple additional chronic thoracolumbar compression fractures again noted. 3.  Emphysema (ICD10-J43.9). 4.  Aortic atherosclerosis (ICD10-I70.0).  Electronically Signed   By: Titus Dubin M.D.   On: 06/16/2019 14:23   CT T-SPINE NO CHARGE  Result Date: 06/16/2019 CLINICAL DATA:  Thoracic spine pain and shortness of breath beginning this morning and worsening. No known injury. History of prior fractures. EXAM: CT THORACIC SPINE WITHOUT CONTRAST TECHNIQUE: Multidetector CT images of the thoracic were obtained using the standard protocol without intravenous contrast. COMPARISON:  MRI thoracic spine 05/31/2019. CT thoracic spine 05/30/2019. FINDINGS: Alignment: There is some exaggeration of the normal thoracic kyphosis but no listhesis. Vertebrae: The patient is status post vertebral augmentation at T11, T12, L1 and L2. Compression fractures at T2, T4, T5, T6, T9 and T10 are again seen. Vertebral body height loss at T5 has worsened since the prior examinations with near vertebra plana deformity now present. New fracture is identified. Bones are osteopenic. No lytic  or sclerotic lesion. Paraspinal and other soft tissues: See report of dedicated chest CT this same day. Disc levels: The appearance is unchanged compared to the prior MRI. The central canal and foramina are open at all levels. Mild bony retropulsion off the superior endplate of [id number] is noted. IMPRESSION: Multiple thoracic spine compression fractures. Vertebral body height loss at T5 has worsened since the most recent examination and is now up to near 90%. The patient's fractures are otherwise unchanged in appearance. No new fracture is identified. The central canal and foramina appear open at each level. Electronically Signed   By: Inge Rise M.D.   On: 06/16/2019 14:21    ____________________________________________   PROCEDURES  Procedure(s) performed (including Critical Care):  .1-3 Lead EKG Interpretation Performed by: Vanessa Carl, MD Authorized by: Vanessa Stephenson, MD     Interpretation: normal     ECG rate:  90s    ECG rate assessment: normal     Rhythm: sinus rhythm       Ectopy: none     Conduction: normal       ____________________________________________   INITIAL IMPRESSION / ASSESSMENT AND PLAN / ED COURSE   Ashley Mack was evaluated in Emergency Department on 06/16/2019 for the symptoms described in the history of present illness. She was evaluated in the context of the global COVID-19 pandemic, which necessitated consideration that the patient might be at risk for infection with the SARS-CoV-2 virus that causes COVID-19. Institutional protocols and algorithms that pertain to the evaluation of patients at risk for COVID-19 are in a state of rapid change based on information released by regulatory bodies including the CDC and federal and state organizations. These policies and algorithms were followed during the patient's care in the ED.     Pt presents with SOB. Will get CT PE but I suspect that this is more likely related to her thoracic back pain.  So we will add on thoracic reformats.  Given concern for shortness of breath we will keep patient on the cardiac monitor PNA-will get xray to evaluation Anemia-CBC to evaluate ACS- will get trops Arrhythmia-Will get EKG and keep on monitor.  COVID- will get testing per algorithm.    Labs are reassuring.  Cardiac markers are negative I am low suspicion for ACS.  White count elevated but patient on steroids.  CT imaging shows worsening compression fracture at 90%.  No symptoms of cord compression.  Patient states her pain is pretty severe.  Discussed with Dr. Rudene Christians who stated that he can do a kyphoplasty on Monday.  There is a possibility patient could be admitted for pain control but unclear if insurance will cover it.  Patient did have a little bit of edema on prior MRI so he wanted to do another repeat thoracic MRI.  If there is concern of an issue with the spine she would need to be transferred.  If reassuring and she can either go home and follow-up on Monday with him versus be admitted.  Patient has  not tolerated braces in the past  Patient handed off to oncoming team pending MRI    ____________________________________________   FINAL CLINICAL IMPRESSION(S) / ED DIAGNOSES   Final diagnoses:  Thoracic back pain  Compression fracture of thoracic vertebra, initial encounter, unspecified thoracic vertebral level (HCC)     MEDICATIONS GIVEN DURING THIS VISIT:  Medications  sodium chloride flush (NS) 0.9 % injection 3 mL (3 mLs Intravenous Not Given 06/16/19 1246)  lidocaine (LIDODERM)  5 % 1 patch (1 patch Transdermal Refused 06/16/19 1256)  HYDROmorphone (DILAUDID) injection 0.5 mg (0.5 mg Intravenous Given 06/16/19 1305)  ondansetron (ZOFRAN) injection 4 mg (4 mg Intravenous Given 06/16/19 1303)  acetaminophen (TYLENOL) tablet 1,000 mg (1,000 mg Oral Given 06/16/19 1307)  iohexol (OMNIPAQUE) 350 MG/ML injection 75 mL (75 mLs Intravenous Contrast Given 06/16/19 1320)  HYDROmorphone (DILAUDID) injection 1 mg (1 mg Intravenous Given 06/16/19 1528)  ondansetron (ZOFRAN) injection 4 mg (4 mg Intravenous Given 06/16/19 1525)     ED Discharge Orders    None       Note:  This document was prepared using Dragon voice recognition software and may include unintentional dictation errors.   Vanessa [place], MD 06/16/19 1539

## 2019-06-16 NOTE — ED Notes (Signed)
Family at bedside. Dr. Jari Pigg is at bedside. Patient c/o pain with b/p cuff inflating.

## 2019-06-16 NOTE — ED Provider Notes (Signed)
Patient with severe pain whenever he moves her.  I discussed with orthopedics and plan will be for admission for pain control and then kyphoplasty likely on Monday.  MRI as dictated below. IMPRESSION: Progressive loss of height at T5 since 05/31/2019 with now severe compression deformity. Marrow edema is present at this level reflecting relative acuity. Mild endplate osseous retropulsion does not substantially add to the pre-existing degenerative stenosis at this level.   Earleen Newport, MD 06/16/19 [phone]

## 2019-06-16 NOTE — ED Triage Notes (Signed)
FIRST NURSE NOTE- here for chest and back pain from [facility].  R/o PE. sats were 97% RA there.  Appears in pain.
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
## 2019-06-17 LAB — CBC
HCT: 26 % — ABNORMAL LOW (ref 36.0–46.0)
Hemoglobin: 8.2 g/dL — ABNORMAL LOW (ref 12.0–15.0)
MCH: 28.9 pg (ref 26.0–34.0)
MCHC: 31.5 g/dL (ref 30.0–36.0)
MCV: 91.5 fL (ref 80.0–100.0)
Platelets: 459 10*3/uL — ABNORMAL HIGH (ref 150–400)
RBC: 2.84 MIL/uL — ABNORMAL LOW (ref 3.87–5.11)
RDW: 17.3 % — ABNORMAL HIGH (ref 11.5–15.5)
WBC: 15.8 10*3/uL — ABNORMAL HIGH (ref 4.0–10.5)
nRBC: 0 % (ref 0.0–0.2)

## 2019-06-17 LAB — BASIC METABOLIC PANEL
Anion gap: 9 (ref 5–15)
BUN: 20 mg/dL (ref 8–23)
CO2: 28 mmol/L (ref 22–32)
Calcium: 8.8 mg/dL — ABNORMAL LOW (ref 8.9–10.3)
Chloride: 99 mmol/L (ref 98–111)
Creatinine, Ser: 1.09 mg/dL — ABNORMAL HIGH (ref 0.44–1.00)
GFR calc Af Amer: 58 mL/min — ABNORMAL LOW (ref >60)
GFR calc non Af Amer: 50 mL/min — ABNORMAL LOW (ref >60)
Glucose, Bld: 101 mg/dL — ABNORMAL HIGH (ref 70–99)
Potassium: 4.8 mmol/L (ref 3.5–5.1)
Sodium: 136 mmol/L (ref 135–145)

## 2019-06-17 LAB — MAGNESIUM: Magnesium: 2.6 mg/dL — ABNORMAL HIGH (ref 1.7–2.4)

## 2019-06-17 NOTE — Progress Notes (Signed)
PROGRESS NOTE    Ashley Mack  [id number] DOB: 1945/07/22 DOA: 06/16/2019 PCP: Kirk Ruths, MD    Assessment & Plan:   Active Problems:   Compression fracture of body of thoracic vertebra (HCC)   Ashley Mack is a 74 y.o. female with medical history significant of multiple compression fractures, RA, emphysema who presented from [facility] for back pain.   # Intractable pain due to new severe T5 compression fracture # Hx of multiple compression fractures --No neurological deficit.   PLAN: --oxycodone 5 mg PRN, and IV morphine PRN --continue home Flexeril PRN --Dr. Rudene Christians to perform kyphoplasty on Monday  # Leukocytosis, chronic --WBC 16.4 on presentation, however, WBC has ranged between 13-17 for the past year.  No signs of infection.  # Hx of Centrilobular emphysema  --continue home Symbicort as Breo  # GERD --continue home PPI as protonix   DVT prophylaxis: Lovenox SQ Code Status: Full code  Family Communication: daughter updated at bedside today Disposition Plan: Home after kyphoplasty on Monday   Subjective and Interval History:  Pt complained of severe back pain, but the pain meds did make the pain tolerable.  Didn't sleep well due to interruptions.  No fever, dyspnea, abdominal pain, N/V/D, dysuria.  No BM yet.   Objective: Vitals:   06/17/19 0530 06/17/19 0539 06/17/19 0554 06/17/19 0822  BP:  (!) 145/97 (!) 138/59 (!) 158/59  Pulse: 96  99 97  Resp: 13 18 18 17   Temp:   97.9 F (36.6 C) 97.8 F (36.6 C)  TempSrc:   Oral Oral  SpO2: 95%  93% 93%  Weight:   68.8 kg   Height:        Intake/Output Summary (Last 24 hours) at 06/17/2019 1355 Last data filed at 06/17/2019 1022 Gross per 24 hour  Intake 120 ml  Output --  Net 120 ml   Filed Weights   06/16/19 1214 06/17/19 0554  Weight: 68 kg 68.8 kg    Examination:   Constitutional: NAD, AAOx3 HEENT: conjunctivae and lids normal, EOMI CV: RRR no M,R,G. Distal pulses +2.  No cyanosis.    RESP: CTA B/L, normal respiratory effort  GI: +BS, NTND Extremities: No effusions, edema, or tenderness in BLE SKIN: warm, dry and intact Neuro: II - XII grossly intact.  Sensation intact Psych: Normal mood and affect.  Appropriate judgement and reason   Data Reviewed: I have personally reviewed following labs and imaging studies  CBC: Recent Labs  Lab 06/16/19 1219 06/17/19 0309  WBC 16.4* 15.8*  HGB 8.7* 8.2*  HCT 28.1* 26.0*  MCV 93.0 91.5  PLT 520* [id number]*   Basic Metabolic Panel: Recent Labs  Lab 06/16/19 1219 06/17/19 0309  NA 133* 136  K 4.1 4.8  CL 97* 99  CO2 27 28  GLUCOSE 123* 101*  BUN 16 20  CREATININE 0.94 1.09*  CALCIUM 8.7* 8.8*  MG  --  2.6*   GFR: Estimated Creatinine Clearance: 40.6 mL/min (A) (by C-G formula based on SCr of 1.09 mg/dL (H)). Liver Function Tests: Recent Labs  Lab 06/16/19 1219  AST 21  ALT 18  ALKPHOS 59  BILITOT 0.5  PROT 6.7  ALBUMIN 3.7   Recent Labs  Lab 06/16/19 1219  LIPASE 25   No results for input(s): AMMONIA in the last 168 hours. Coagulation Profile: No results for input(s): INR, PROTIME in the last 168 hours. Cardiac Enzymes: No results for input(s): CKTOTAL, CKMB, CKMBINDEX, TROPONINI in the last 168 hours. BNP (  last 3 results) No results for input(s): PROBNP in the last 8760 hours. HbA1C: No results for input(s): HGBA1C in the last 72 hours. CBG: No results for input(s): GLUCAP in the last 168 hours. Lipid Profile: No results for input(s): CHOL, HDL, LDLCALC, TRIG, CHOLHDL, LDLDIRECT in the last 72 hours. Thyroid Function Tests: No results for input(s): TSH, T4TOTAL, FREET4, T3FREE, THYROIDAB in the last 72 hours. Anemia Panel: No results for input(s): VITAMINB12, FOLATE, FERRITIN, TIBC, IRON, RETICCTPCT in the last 72 hours. Sepsis Labs: No results for input(s): PROCALCITON, LATICACIDVEN in the last 168 hours.  No results found for this or any previous visit (from the past 240 hour(s)).     Radiology Studies: CT Angio Chest PE W and/or Wo Contrast  Result Date: 06/16/2019 CLINICAL DATA:  Shortness of breath. EXAM: CT ANGIOGRAPHY CHEST WITH CONTRAST TECHNIQUE: Multidetector CT imaging of the chest was performed using the standard protocol during bolus administration of intravenous contrast. Multiplanar CT image reconstructions and MIPs were obtained to evaluate the vascular anatomy. CONTRAST:  77mL OMNIPAQUE IOHEXOL 350 MG/ML SOLN COMPARISON:  CTA chest dated May 30, 2019. FINDINGS: Cardiovascular: Satisfactory opacification of the pulmonary arteries to the segmental level. No evidence of pulmonary embolism. Normal heart size. No pericardial effusion. No thoracic aortic aneurysm or dissection. Coronary, aortic arch, and branch vessel atherosclerotic vascular disease. Mediastinum/Nodes: No enlarged mediastinal, hilar, or axillary lymph nodes. Thyroid gland, trachea, and esophagus demonstrate no significant findings. Lungs/Pleura: Mild centrilobular emphysema again noted. No focal consolidation, pleural effusion, or pneumothorax. Minimal linear scarring/atelectasis at the lung bases. Upper Abdomen: No acute abnormality. Musculoskeletal: Multiple chronic thoracolumbar compression fractures again noted. Progressive now severe height loss of the T5 vertebral body when compared to recent thoracic spine MRI. Prior T11-L2 kyphoplasties. Review of the MIP images confirms the above findings. IMPRESSION: 1. No evidence of pulmonary embolism. No acute intrathoracic process. 2. Acute on chronic T5 compression fracture with progressive now severe height loss when compared to recent thoracic spine MRI. Multiple additional chronic thoracolumbar compression fractures again noted. 3.  Emphysema (ICD10-J43.9). 4.  Aortic atherosclerosis (ICD10-I70.0). Electronically Signed   By: Titus Dubin M.D.   On: 06/16/2019 14:23   MR THORACIC SPINE WO CONTRAST  Result Date: 06/16/2019 CLINICAL DATA:  Worsening back  pain, compression fractures on CT EXAM: MRI THORACIC SPINE WITHOUT CONTRAST TECHNIQUE: Multiplanar, multisequence MR imaging of the thoracic spine was performed. No intravenous contrast was administered. COMPARISON:  MRI thoracic spine 05/31/2019, correlation made with same day thoracic spine CT FINDINGS: Alignment: Multilevel mild endplate retropulsion related to compression fractures. Vertebrae: There is progressive loss of height at the T5 level with now severe compression deformity. STIR hyperintensity likely reflecting marrow edema is present at this level. There is mild endplate retropulsion. Multilevel compression deformities are otherwise unchanged with evidence of cement augmentation at lower thoracic levels. Cord:  No abnormal signal within limitation of motion artifact. Paraspinal and other soft tissues: Unremarkable. Disc levels: Endplate retropulsion at T5-T6 results does not substantially altered pre-existing degenerative stenosis at this level primarily secondary to facet arthropathy and ligamentum flavum thickening. Degenerative changes are stable at other levels over the short interval. IMPRESSION: Progressive loss of height at T5 since 05/31/2019 with now severe compression deformity. Marrow edema is present at this level reflecting relative acuity. Mild endplate osseous retropulsion does not substantially add to the pre-existing degenerative stenosis at this level. Electronically Signed   By: Macy Mis M.D.   On: 06/16/2019 16:46   CT T-SPINE NO CHARGE  Result Date: 06/16/2019 CLINICAL DATA:  Thoracic spine pain and shortness of breath beginning this morning and worsening. No known injury. History of prior fractures. EXAM: CT THORACIC SPINE WITHOUT CONTRAST TECHNIQUE: Multidetector CT images of the thoracic were obtained using the standard protocol without intravenous contrast. COMPARISON:  MRI thoracic spine 05/31/2019. CT thoracic spine 05/30/2019. FINDINGS: Alignment: There is some  exaggeration of the normal thoracic kyphosis but no listhesis. Vertebrae: The patient is status post vertebral augmentation at T11, T12, L1 and L2. Compression fractures at T2, T4, T5, T6, T9 and T10 are again seen. Vertebral body height loss at T5 has worsened since the prior examinations with near vertebra plana deformity now present. New fracture is identified. Bones are osteopenic. No lytic or sclerotic lesion. Paraspinal and other soft tissues: See report of dedicated chest CT this same day. Disc levels: The appearance is unchanged compared to the prior MRI. The central canal and foramina are open at all levels. Mild bony retropulsion off the superior endplate of 624THL is noted. IMPRESSION: Multiple thoracic spine compression fractures. Vertebral body height loss at T5 has worsened since the most recent examination and is now up to near 90%. The patient's fractures are otherwise unchanged in appearance. No new fracture is identified. The central canal and foramina appear open at each level. Electronically Signed   By: Inge Rise M.D.   On: 06/16/2019 14:21     Scheduled Meds: . enoxaparin (LOVENOX) injection  40 mg Subcutaneous Q24H  . fluticasone furoate-vilanterol  1 puff Inhalation Daily  . lidocaine  1 patch Transdermal Q24H  . pantoprazole  40 mg Oral Daily  . polyethylene glycol  17 g Oral BID  . predniSONE  5 mg Oral Daily  . sodium chloride flush  3 mL Intravenous Once   Continuous Infusions:   LOS: 1 day     Enzo Bi, MD [facility] If 7PM-7AM, please contact night-coverage 06/17/2019, 1:55 PM
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
## 2019-06-18 DIAGNOSIS — Z9889 Other specified postprocedural states: Secondary | ICD-10-CM

## 2019-06-18 DIAGNOSIS — M069 Rheumatoid arthritis, unspecified: Secondary | ICD-10-CM | POA: Diagnosis present

## 2019-06-18 DIAGNOSIS — K219 Gastro-esophageal reflux disease without esophagitis: Secondary | ICD-10-CM | POA: Diagnosis present

## 2019-06-18 LAB — CBC
HCT: 26.1 % — ABNORMAL LOW (ref 36.0–46.0)
Hemoglobin: 8.4 g/dL — ABNORMAL LOW (ref 12.0–15.0)
MCH: 29.5 pg (ref 26.0–34.0)
MCHC: 32.2 g/dL (ref 30.0–36.0)
MCV: 91.6 fL (ref 80.0–100.0)
Platelets: 478 10*3/uL — ABNORMAL HIGH (ref 150–400)
RBC: 2.85 MIL/uL — ABNORMAL LOW (ref 3.87–5.11)
RDW: 17.1 % — ABNORMAL HIGH (ref 11.5–15.5)
WBC: 11.6 10*3/uL — ABNORMAL HIGH (ref 4.0–10.5)
nRBC: 0 % (ref 0.0–0.2)

## 2019-06-18 LAB — BASIC METABOLIC PANEL
Anion gap: 6 (ref 5–15)
BUN: 16 mg/dL (ref 8–23)
CO2: 30 mmol/L (ref 22–32)
Calcium: 8.7 mg/dL — ABNORMAL LOW (ref 8.9–10.3)
Chloride: 99 mmol/L (ref 98–111)
Creatinine, Ser: 0.96 mg/dL (ref 0.44–1.00)
GFR calc Af Amer: >60 mL/min (ref >60)
GFR calc non Af Amer: 58 mL/min — ABNORMAL LOW (ref >60)
Glucose, Bld: 92 mg/dL (ref 70–99)
Potassium: 4.3 mmol/L (ref 3.5–5.1)
Sodium: 135 mmol/L (ref 135–145)

## 2019-06-18 LAB — MAGNESIUM: Magnesium: 2.4 mg/dL (ref 1.7–2.4)

## 2019-06-18 MED ORDER — CEFAZOLIN (ANCEF) 1 G IV SOLR: 1.0000 g | INTRAVENOUS | Status: DC

## 2019-06-18 MED ORDER — CEFAZOLIN SODIUM-DEXTROSE 1-4 GM/50ML-% IV SOLN
1.0000 g | Freq: Once | INTRAVENOUS | Status: AC
  Administered 2019-06-18: 12:00:00 1 g via INTRAVENOUS
  Filled 2019-06-18: qty 50

## 2019-06-18 NOTE — Progress Notes (Signed)
PROGRESS NOTE    [name]  [id number] DOB: [birth year] DOA: 06/16/2019 PCP: Kirk Ruths, MD    Assessment & Plan:   Active Problems:   Compression fracture of body of thoracic vertebra (HCC)   Ashley Mack is a 74 y.o. female with medical history significant of multiple compression fractures, RA, emphysema who presented from [facility] for [facility] pain.   # Intractable pain due to new severe T5 compression fracture # Hx of multiple compression fractures --No neurological deficit.   PLAN: --oxycodone 5 mg PRN, and IV morphine PRN --continue home Flexeril PRN --Dr. Rudene Christians to perform kyphoplasty on Monday  # Leukocytosis, chronic --WBC 16.4 on presentation, however, WBC has ranged between 13-17 for the past year.  No signs of infection.  # Hx of Centrilobular emphysema  --continue home Symbicort as Breo  # GERD --continue home PPI as protonix   DVT prophylaxis: Lovenox SQ Code Status: Full code  Family Communication:  Disposition Plan: Home after kyphoplasty on Monday   Subjective and Interval History:  Back pain is still severe.  Pt reported passing a lot of gas but no BM yet.  Ate well.  No fever, dyspnea, chest pain, abdominal pain, N/V/D.   Objective: Vitals:   06/17/19 2149 06/18/19 0017 06/18/19 0501 06/18/19 0815  BP: (!) 143/75 (!) 146/88  (!) 150/61  Pulse: (!) 105 (!) 101 98 98  Resp: 16 16  17   Temp: 99.1 F (37.3 C) 98.3 F (36.8 C)  98 F (36.7 C)  TempSrc: Oral Oral  Oral  SpO2: 93% 91%  95%  Weight:      Height:        Intake/Output Summary (Last 24 hours) at 06/18/2019 1442 Last data filed at 06/18/2019 0900 Gross per 24 hour  Intake 0 ml  Output 1300 ml  Net -1300 ml   Filed Weights   06/16/19 1214 06/17/19 0554  Weight: 68 kg 68.8 kg    Examination:   Constitutional: NAD, AAOx3 HEENT: conjunctivae and lids normal, EOMI CV: RRR tachycardic. Distal pulses +2.  No cyanosis.   RESP: CTA B/L, normal respiratory effort  GI: +BS,  NTND Extremities: No effusions, edema, or tenderness in BLE SKIN: warm, dry and intact Neuro: II - XII grossly intact.  Sensation intact Psych: Normal mood and affect.  Appropriate judgement and reason   Data Reviewed: I have personally reviewed following labs and imaging studies  CBC: Recent Labs  Lab 06/16/19 1219 06/17/19 0309 06/18/19 0420  WBC 16.4* 15.8* 11.6*  HGB 8.7* 8.2* 8.4*  HCT 28.1* 26.0* 26.1*  MCV 93.0 91.5 91.6  PLT 520* 459* [id number]*   Basic Metabolic Panel: Recent Labs  Lab 06/16/19 1219 06/17/19 0309 06/18/19 0420  NA 133* 136 135  K 4.1 4.8 4.3  CL 97* 99 99  CO2 27 28 30   GLUCOSE 123* 101* 92  BUN 16 20 16   CREATININE 0.94 1.09* 0.96  CALCIUM 8.7* 8.8* 8.7*  MG  --  2.6* 2.4   GFR: Estimated Creatinine Clearance: 46 mL/min (by C-G formula based on SCr of 0.96 mg/dL). Liver Function Tests: Recent Labs  Lab 06/16/19 1219  AST 21  ALT 18  ALKPHOS 59  BILITOT 0.5  PROT 6.7  ALBUMIN 3.7   Recent Labs  Lab 06/16/19 1219  LIPASE 25   No results for input(s): AMMONIA in the last 168 hours. Coagulation Profile: No results for input(s): INR, PROTIME in the last 168 hours. Cardiac Enzymes: No results for  input(s): CKTOTAL, CKMB, CKMBINDEX, TROPONINI in the last 168 hours. BNP (last 3 results) No results for input(s): PROBNP in the last 8760 hours. HbA1C: No results for input(s): HGBA1C in the last 72 hours. CBG: No results for input(s): GLUCAP in the last 168 hours. Lipid Profile: No results for input(s): CHOL, HDL, LDLCALC, TRIG, CHOLHDL, LDLDIRECT in the last 72 hours. Thyroid Function Tests: No results for input(s): TSH, T4TOTAL, FREET4, T3FREE, THYROIDAB in the last 72 hours. Anemia Panel: No results for input(s): VITAMINB12, FOLATE, FERRITIN, TIBC, IRON, RETICCTPCT in the last 72 hours. Sepsis Labs: No results for input(s): PROCALCITON, LATICACIDVEN in the last 168 hours.  No results found for this or any previous visit (from the  past 240 hour(s)).    Radiology Studies: MR THORACIC SPINE WO CONTRAST  Result Date: 06/16/2019 CLINICAL DATA:  Worsening back pain, compression fractures on CT EXAM: MRI THORACIC SPINE WITHOUT CONTRAST TECHNIQUE: Multiplanar, multisequence MR imaging of the thoracic spine was performed. No intravenous contrast was administered. COMPARISON:  MRI thoracic spine 05/31/2019, correlation made with same day thoracic spine CT FINDINGS: Alignment: Multilevel mild endplate retropulsion related to compression fractures. Vertebrae: There is progressive loss of height at the T5 level with now severe compression deformity. STIR hyperintensity likely reflecting marrow edema is present at this level. There is mild endplate retropulsion. Multilevel compression deformities are otherwise unchanged with evidence of cement augmentation at lower thoracic levels. Cord:  No abnormal signal within limitation of motion artifact. Paraspinal and other soft tissues: Unremarkable. Disc levels: Endplate retropulsion at T5-T6 results does not substantially altered pre-existing degenerative stenosis at this level primarily secondary to facet arthropathy and ligamentum flavum thickening. Degenerative changes are stable at other levels over the short interval. IMPRESSION: Progressive loss of height at T5 since 05/31/2019 with now severe compression deformity. Marrow edema is present at this level reflecting relative acuity. Mild endplate osseous retropulsion does not substantially add to the pre-existing degenerative stenosis at this level. Electronically Signed   By: Macy Mis M.D.   On: 06/16/2019 16:46     Scheduled Meds: . fluticasone furoate-vilanterol  1 puff Inhalation Daily  . lidocaine  1 patch Transdermal Q24H  . pantoprazole  40 mg Oral Daily  . polyethylene glycol  17 g Oral BID  . predniSONE  5 mg Oral Daily  . sodium chloride flush  3 mL Intravenous Once   Continuous Infusions:   LOS: 2 days     Enzo Bi,  MD [facility] If 7PM-7AM, please contact night-coverage 06/18/2019, 2:42 PM

## 2019-06-18 NOTE — Plan of Care (Signed)

## 2019-06-19 ENCOUNTER — Inpatient Hospital Stay: Payer: Medicare Other | Admitting: Anesthesiology

## 2019-06-19 ENCOUNTER — Encounter: Payer: Self-pay | Admitting: Hospitalist

## 2019-06-19 ENCOUNTER — Encounter
Admission: EM | Disposition: A | Discharge status: Home / Self Care | Payer: Self-pay | Source: Home / Self Care | Attending: Hospitalist

## 2019-06-19 ENCOUNTER — Inpatient Hospital Stay: Payer: Medicare Other

## 2019-06-19 HISTORY — PX: KYPHOPLASTY: SHX5884

## 2019-06-19 LAB — CBC
HCT: 25.5 % — ABNORMAL LOW (ref 36.0–46.0)
Hemoglobin: 7.9 g/dL — ABNORMAL LOW (ref 12.0–15.0)
MCH: 28.4 pg (ref 26.0–34.0)
MCHC: 31 g/dL (ref 30.0–36.0)
MCV: 91.7 fL (ref 80.0–100.0)
Platelets: 488 10*3/uL — ABNORMAL HIGH (ref 150–400)
RBC: 2.78 MIL/uL — ABNORMAL LOW (ref 3.87–5.11)
RDW: 16.7 % — ABNORMAL HIGH (ref 11.5–15.5)
WBC: 10 10*3/uL (ref 4.0–10.5)
nRBC: 0 % (ref 0.0–0.2)

## 2019-06-19 LAB — BASIC METABOLIC PANEL
Anion gap: 9 (ref 5–15)
BUN: 16 mg/dL (ref 8–23)
CO2: 28 mmol/L (ref 22–32)
Calcium: 8.4 mg/dL — ABNORMAL LOW (ref 8.9–10.3)
Chloride: 97 mmol/L — ABNORMAL LOW (ref 98–111)
Creatinine, Ser: 0.85 mg/dL (ref 0.44–1.00)
GFR calc Af Amer: >60 mL/min (ref >60)
GFR calc non Af Amer: >60 mL/min (ref >60)
Glucose, Bld: 87 mg/dL (ref 70–99)
Potassium: 3.9 mmol/L (ref 3.5–5.1)
Sodium: 134 mmol/L — ABNORMAL LOW (ref 135–145)

## 2019-06-19 LAB — MAGNESIUM: Magnesium: 2.4 mg/dL (ref 1.7–2.4)

## 2019-06-19 SURGERY — KYPHOPLASTY
Anesthesia: General

## 2019-06-19 MED ORDER — LIDOCAINE HCL 1 % IJ SOLN
INTRAMUSCULAR | Status: DC | PRN
  Administered 2019-06-19: 20 mL

## 2019-06-19 MED ORDER — ONDANSETRON HCL 4 MG/2ML IJ SOLN: 4.0000 mg | Freq: Once | INTRAMUSCULAR | Status: DC | PRN

## 2019-06-19 MED ORDER — POLYETHYLENE GLYCOL 3350 17 G PO PACK: PACK | ORAL | 0 refills | Status: DC

## 2019-06-19 MED ORDER — IOHEXOL 180 MG/ML  SOLN
INTRAMUSCULAR | Status: DC | PRN
  Administered 2019-06-19: 12:00:00 20 mL

## 2019-06-19 MED ORDER — FENTANYL CITRATE (PF) 100 MCG/2ML IJ SOLN
25.0000 ug | INTRAMUSCULAR | Status: DC | PRN
  Administered 2019-06-19: 25 ug via INTRAVENOUS

## 2019-06-19 MED ORDER — FENTANYL CITRATE (PF) 100 MCG/2ML IJ SOLN
INTRAMUSCULAR | Status: AC
  Administered 2019-06-19: 25 ug via INTRAVENOUS
  Filled 2019-06-19: qty 2

## 2019-06-19 MED ORDER — MIDAZOLAM HCL 2 MG/2ML IJ SOLN
INTRAMUSCULAR | Status: AC
  Filled 2019-06-19: qty 2

## 2019-06-19 MED ORDER — FENTANYL CITRATE (PF) 100 MCG/2ML IJ SOLN
INTRAMUSCULAR | Status: AC
  Filled 2019-06-19: qty 2

## 2019-06-19 MED ORDER — OXYCODONE-ACETAMINOPHEN 5-325 MG PO TABS: 1.0000 | ORAL_TABLET | Freq: Four times a day (QID) | ORAL | 0 refills | Status: AC | PRN

## 2019-06-19 MED ORDER — BUPIVACAINE-EPINEPHRINE (PF) 0.5% -1:200000 IJ SOLN
INTRAMUSCULAR | Status: DC | PRN
  Administered 2019-06-19: 10 mL

## 2019-06-19 MED ORDER — MIDAZOLAM HCL 2 MG/2ML IJ SOLN
INTRAMUSCULAR | Status: DC | PRN
  Administered 2019-06-19 (×2): 1 mg via INTRAVENOUS

## 2019-06-19 MED ORDER — PROPOFOL 10 MG/ML IV BOLUS
INTRAVENOUS | Status: DC | PRN
  Administered 2019-06-19: 40 ug/kg/min via INTRAVENOUS

## 2019-06-19 MED ORDER — CEFAZOLIN SODIUM-DEXTROSE 1-4 GM/50ML-% IV SOLN
INTRAVENOUS | Status: DC | PRN
  Administered 2019-06-19: 1 g via INTRAVENOUS

## 2019-06-19 MED ORDER — ONDANSETRON HCL 4 MG/2ML IJ SOLN
INTRAMUSCULAR | Status: DC | PRN
  Administered 2019-06-19: 4 mg via INTRAVENOUS

## 2019-06-19 MED ORDER — FENTANYL CITRATE (PF) 100 MCG/2ML IJ SOLN
INTRAMUSCULAR | Status: DC | PRN
  Administered 2019-06-19 (×2): 25 ug via INTRAVENOUS

## 2019-06-19 MED ORDER — PROPOFOL 500 MG/50ML IV EMUL
INTRAVENOUS | Status: AC
  Filled 2019-06-19: qty 100

## 2019-06-19 MED ORDER — CEFAZOLIN SODIUM-DEXTROSE 2-4 GM/100ML-% IV SOLN
INTRAVENOUS | Status: AC
  Filled 2019-06-19: qty 100

## 2019-06-19 SURGICAL SUPPLY — 20 items
CEMENT KYPHON CX01A KIT/MIXER (Cement) ×3 IMPLANT
COVER WAND RF STERILE (DRAPES) ×3 IMPLANT
DERMABOND ADVANCED (GAUZE/BANDAGES/DRESSINGS) ×2
DERMABOND ADVANCED .7 DNX12 (GAUZE/BANDAGES/DRESSINGS) ×1 IMPLANT
DEVICE BIOPSY BONE KYPHX (INSTRUMENTS) ×3 IMPLANT
DRAPE C-ARM XRAY 36X54 (DRAPES) ×3 IMPLANT
DURAPREP 26ML APPLICATOR (WOUND CARE) ×3 IMPLANT
GLOVE SURG SYN 9.0  PF PI (GLOVE) ×2
GLOVE SURG SYN 9.0 PF PI (GLOVE) ×1 IMPLANT
GOWN SRG 2XL LVL 4 RGLN SLV (GOWNS) ×1 IMPLANT
GOWN STRL NON-REIN 2XL LVL4 (GOWNS) ×2
GOWN STRL REUS W/ TWL LRG LVL3 (GOWN DISPOSABLE) ×1 IMPLANT
GOWN STRL REUS W/TWL LRG LVL3 (GOWN DISPOSABLE) ×2
PACK KYPHOPLASTY (MISCELLANEOUS) ×3 IMPLANT
RENTAL RFA  GENERATOR (MISCELLANEOUS)
RENTAL RFA GENERATOR (MISCELLANEOUS) IMPLANT
STRAP SAFETY 5IN WIDE (MISCELLANEOUS) ×3 IMPLANT
TRAY KYPHOPAK 15/2 EXPRESS (KITS) ×3 IMPLANT
TRAY KYPHOPAK 15/3 EXPRESS 1ST (MISCELLANEOUS) IMPLANT
TRAY KYPHOPAK 20/3 EXPRESS 1ST (MISCELLANEOUS) IMPLANT

## 2019-06-19 NOTE — Transfer of Care (Signed)
Immediate Anesthesia Transfer of Care Note  Patient: Ashley Mack  Procedure(s) Performed: KYPHOPLASTY T5 (N/A )  Patient Location: PACU  Anesthesia Type:MAC  Level of Consciousness: awake  Airway & Oxygen Therapy: Patient Spontanous Breathing  Post-op Assessment: Report given to RN  Post vital signs: stable  Last Vitals:  Vitals Value Taken Time  BP 150/65 06/19/19 1232  Temp    Pulse 100 06/19/19 1237  Resp 16 06/19/19 1239  SpO2 96 % 06/19/19 1237  Vitals shown include unvalidated device data.  Last Pain:  Vitals:   06/19/19 1050  TempSrc: Temporal  PainSc: 5       Patients Stated Pain Goal: (P) 2 (50/53/97 6734)  Complications: No apparent anesthesia complications

## 2019-06-19 NOTE — Op Note (Signed)
Date June 19, 2019  time 12:24 PM   PATIENT:  Ashley Mack   PRE-OPERATIVE DIAGNOSIS:  closed wedge compression fracture of T5   POST-OPERATIVE DIAGNOSIS:  closed wedge compression fracture of T5   PROCEDURE:  Procedure(s): KYPHOPLASTY T5  SURGEON: Laurene Footman, MD   ASSISTANTS: None   ANESTHESIA:   local and MAC   EBL:  No intake/output data recorded.   BLOOD ADMINISTERED:none   DRAINS: none    LOCAL MEDICATIONS USED:  MARCAINE    and XYLOCAINE    SPECIMEN:   None   DISPOSITION OF SPECIMEN:  Not applicable   COUNTS:  YES   TOURNIQUET:  * No tourniquets in log *   IMPLANTS: Bone cement   DICTATION: .Dragon Dictation  patient was brought to the operating room and after adequate anesthesia was obtained the patient was placed prone.  C arm was brought in in good visualization of the affected level obtained on both AP and lateral projections.  After patient identification and timeout procedures were completed, local anesthetic was infiltrated with 10 cc 1% Xylocaine infiltrated subcutaneously.  This is done the area on the right side of the planned approach.  The back was then prepped and draped in the usual sterile manner and repeat timeout procedure carried out.  A spinal needle was brought down to the pedicle on the right side of  T5 and a 50-50 mix of 1% Xylocaine half percent Sensorcaine with epinephrine total of 20 cc injected.  After allowing this to set a small incision was made and the trocar was advanced into the vertebral body in an extrapedicular fashion.  Biopsy was not obtained despite attempt Drilling was carried out balloon inserted with inflation to  1-1/2 cc.  When the cement was appropriate consistency 1.75 cc were injected into the vertebral body without extravasation, good fill superior to inferior endplates and from right to left sides along the inferior endplate.  After the cement had set the trochar was removed and permanent C-arm views obtained.  The wound  was closed with Dermabond followed by Band-Aid   PLAN OF CARE:  Continue as inpatient   PATIENT DISPOSITION:  PACU - hemodynamically stable.

## 2019-06-19 NOTE — Care Management Obs Status (Signed)
[place]   Patient Details  Name: Ashley Mack MRN: [id number] Date of Birth: 12/13/1945   Medicare Observation Status Notification Given:       Juliann Pulse A Aivah Putman 06/19/2019, 11:00 AM
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
## 2019-06-19 NOTE — Discharge Summary (Signed)
Physician Discharge Summary   Ashley Mack  female DOB: 10-Sep-1945  [id number]  PCP: Kirk Ruths, MD  Admit date: 06/16/2019 Discharge date: 06/19/2019  Admitted From: home Disposition:  home CODE STATUS: Full code  Discharge Instructions    Diet - low sodium heart healthy   Complete by: As directed    Increase activity slowly   Complete by: As directed        Hospital Course:  For full details, please see H&P, progress notes, consult notes and ancillary notes.  Briefly,  Ashley Mack a 74 y.o.Caucasian femalewith medical history significant ofmultiple compression fractures, RA, emphysema who presented from [facility] for back pain.   # Intractable pain due to new severe T5 compression fracture # Hx of multiple compression fractures No neurological deficit. Pt received oxycodone 5 mg PRN, and IV morphine PRN for pain control.  Dr. Rudene Christians to performkyphoplastyon Monday 06/19/19, and pt asked to be discharged right afterwards.  # Leukocytosis, chronic WBC 16.4 on presentation, however, WBC has ranged between 13-17 for the past year. No signs of infection.  # Hx ofCentrilobular emphysema continued home Symbicort as Breo  # GERD continued home PPI as protonix  Discharge Diagnoses:  Principal Problem:   Compression fracture of body of thoracic vertebra (HCC) Active Problems:   Intractable back pain   Centrilobular emphysema (HCC)   GERD (gastroesophageal reflux disease)   Rheumatoid arthritis involving multiple sites [facility])   H/O kyphoplasty    Discharge Instructions:  Allergies as of 06/19/2019      Reactions   Methotrexate Derivatives Nausea And Vomiting, Other (See Comments)   Flu like symptoms   Other Nausea Only, Other (See Comments)   Arthritis medications . Unsure of what the med was.   Ranitidine Hives   Sulfasalazine Other (See Comments)   Stomach upset. Unsure of reaction. Long time ago   Topiramate Nausea Only      Medication List     STOP taking these medications   HYDROcodone-acetaminophen 5-325 MG tablet Commonly known as: NORCO/VICODIN     TAKE these medications   albuterol 108 (90 Base) MCG/ACT inhaler Commonly known as: VENTOLIN HFA Inhale 2 puffs into the lungs every 6 (six) hours as needed for wheezing or shortness of breath.   cyanocobalamin 1000 MCG/ML injection Commonly known as: (VITAMIN B-12) Inject 1 mL (1,000 mcg total) into the muscle every 30 (thirty) days.   cyclobenzaprine 10 MG tablet Commonly known as: FLEXERIL Take 5-10 mg by mouth 3 (three) times daily as needed for muscle spasms.   Dexilant 60 MG capsule Generic drug: dexlansoprazole Take 60 mg by mouth daily.   fluticasone 50 MCG/ACT nasal spray Commonly known as: FLONASE Place 1-2 sprays into both nostrils daily as needed for allergies or rhinitis.   Integra 62.5-62.5-40-3 MG Caps Take 1 capsule by mouth daily.   oxyCODONE-acetaminophen 5-325 MG tablet Commonly known as: PERCOCET/ROXICET Take 1 tablet by mouth every 6 (six) hours as needed for up to 7 days for moderate pain or severe pain. What changed: when to take this   polyethylene glycol 17 g packet Commonly known as: MiraLax Dissolve 34 g Miralax in 8 oz liquid, take it every 2 hours until you start having bowel movement.   predniSONE 5 MG tablet Commonly known as: DELTASONE Take 5 mg by mouth daily.   Symbicort 80-4.5 MCG/ACT inhaler Generic drug: budesonide-formoterol Inhale 2 puffs into the lungs daily.   Vitamin D3 50 MCG (2000 UT) capsule Take 2,000  Units by mouth daily.       Follow-up Information    Kirk Ruths, MD. Schedule an appointment as soon as possible for a visit in 1 week(s).   Specialty: Internal Medicine Contact information: [address] [place] [address] [phone]           Allergies  Allergen Reactions  . Methotrexate Derivatives Nausea And Vomiting and Other (See Comments)    Flu like symptoms  .  Other Nausea Only and Other (See Comments)    Arthritis medications . Unsure of what the med was.  . Ranitidine Hives  . Sulfasalazine Other (See Comments)    Stomach upset. Unsure of reaction. Long time ago  . Topiramate Nausea Only     The results of significant diagnostics from this hospitalization (including imaging, microbiology, ancillary and laboratory) are listed below for reference.   Consultations:   Procedures/Studies: DG Chest 2 View  Result Date: 05/29/2019 CLINICAL DATA:  74 year female with shortness of breath. EXAM: CHEST - 2 VIEW COMPARISON:  Chest radiograph dated 05/06/2015 FINDINGS: Background of emphysema. No focal consolidation, pleural effusion, pneumothorax. The cardiac silhouette is within normal limits. Atherosclerotic calcification of the aorta. Osteopenia with multilevel degenerative changes of the spine and lower thoracic compression fractures and vertebroplasty. No acute osseous pathology. IMPRESSION: 1. No acute cardiopulmonary process. 2. Emphysema. Electronically Signed   By: Anner Crete M.D.   On: 05/29/2019 21:11   DG Thoracic Spine 2 View  Result Date: 06/19/2019 CLINICAL DATA:  Fractures of the thoracic spine. EXAM: THORACIC SPINE 2 VIEWS COMPARISON:  MRI dated 06/16/2019 FINDINGS: C-arm images demonstrate performance of kyphoplasty at T5. Old compression fracture of T6. IMPRESSION: Kyphoplasty performed at T5. Electronically Signed   By: Lorriane Shire M.D.   On: 06/19/2019 12:51   CT Angio Chest PE W and/or Wo Contrast  Result Date: 06/16/2019 CLINICAL DATA:  Shortness of breath. EXAM: CT ANGIOGRAPHY CHEST WITH CONTRAST TECHNIQUE: Multidetector CT imaging of the chest was performed using the standard protocol during bolus administration of intravenous contrast. Multiplanar CT image reconstructions and MIPs were obtained to evaluate the vascular anatomy. CONTRAST:  96mL OMNIPAQUE IOHEXOL 350 MG/ML SOLN COMPARISON:  CTA chest dated May 30, 2019. FINDINGS: Cardiovascular: Satisfactory opacification of the pulmonary arteries to the segmental level. No evidence of pulmonary embolism. Normal heart size. No pericardial effusion. No thoracic aortic aneurysm or dissection. Coronary, aortic arch, and branch vessel atherosclerotic vascular disease. Mediastinum/Nodes: No enlarged mediastinal, hilar, or axillary lymph nodes. Thyroid gland, trachea, and esophagus demonstrate no significant findings. Lungs/Pleura: Mild centrilobular emphysema again noted. No focal consolidation, pleural effusion, or pneumothorax. Minimal linear scarring/atelectasis at the lung bases. Upper Abdomen: No acute abnormality. Musculoskeletal: Multiple chronic thoracolumbar compression fractures again noted. Progressive now severe height loss of the T5 vertebral body when compared to recent thoracic spine MRI. Prior T11-L2 kyphoplasties. Review of the MIP images confirms the above findings. IMPRESSION: 1. No evidence of pulmonary embolism. No acute intrathoracic process. 2. Acute on chronic T5 compression fracture with progressive now severe height loss when compared to recent thoracic spine MRI. Multiple additional chronic thoracolumbar compression fractures again noted. 3.  Emphysema (ICD10-J43.9). 4.  Aortic atherosclerosis (ICD10-I70.0). Electronically Signed   By: Titus Dubin M.D.   On: 06/16/2019 14:23   CT Thoracic Spine Wo Contrast  Result Date: 05/30/2019 CLINICAL DATA:  Upper back pain. History of multiple vertebral augmentations. EXAM: CT THORACIC SPINE WITHOUT CONTRAST TECHNIQUE: Multidetector CT images of the thoracic were  obtained using the standard protocol without intravenous contrast. COMPARISON:  None. FINDINGS: Alignment: Normal. Vertebrae: Status post vertebral augmentation at T11, T12 and L1. There is chronic height loss that is unchanged at T4, T6 and T9. There is no new compression fracture. No progressive height loss at the augmented levels. Paraspinal and  other soft tissues: There is calcific aortic atherosclerosis. Disc levels: There is no spinal canal stenosis. No visible disc herniation. IMPRESSION: 1. No acute fracture or static subluxation of the thoracic spine. 2. Unchanged chronic height loss at T4, T6 and T9. 3. Status post vertebral augmentation at T11, T12 and L1, without progression of height loss. 4. Aortic Atherosclerosis (ICD10-I70.0). Electronically Signed   By: Ulyses Jarred M.D.   On: 05/30/2019 00:39   MR THORACIC SPINE WO CONTRAST  Result Date: 06/16/2019 CLINICAL DATA:  Worsening back pain, compression fractures on CT EXAM: MRI THORACIC SPINE WITHOUT CONTRAST TECHNIQUE: Multiplanar, multisequence MR imaging of the thoracic spine was performed. No intravenous contrast was administered. COMPARISON:  MRI thoracic spine 05/31/2019, correlation made with same day thoracic spine CT FINDINGS: Alignment: Multilevel mild endplate retropulsion related to compression fractures. Vertebrae: There is progressive loss of height at the T5 level with now severe compression deformity. STIR hyperintensity likely reflecting marrow edema is present at this level. There is mild endplate retropulsion. Multilevel compression deformities are otherwise unchanged with evidence of cement augmentation at lower thoracic levels. Cord:  No abnormal signal within limitation of motion artifact. Paraspinal and other soft tissues: Unremarkable. Disc levels: Endplate retropulsion at T5-T6 results does not substantially altered pre-existing degenerative stenosis at this level primarily secondary to facet arthropathy and ligamentum flavum thickening. Degenerative changes are stable at other levels over the short interval. IMPRESSION: Progressive loss of height at T5 since 05/31/2019 with now severe compression deformity. Marrow edema is present at this level reflecting relative acuity. Mild endplate osseous retropulsion does not substantially add to the pre-existing degenerative  stenosis at this level. Electronically Signed   By: Macy Mis M.D.   On: 06/16/2019 16:46   MR THORACIC SPINE WO CONTRAST  Result Date: 05/31/2019 CLINICAL DATA:  Acute mid back pain. Pain radiates between shoulder blades. EXAM: MRI THORACIC SPINE WITHOUT CONTRAST TECHNIQUE: Multiplanar, multisequence MR imaging of the thoracic spine was performed. No intravenous contrast was administered. COMPARISON:  Thoracic spine CT 05/30/2019 FINDINGS: Alignment:  Normal Vertebrae: There are augmented compression fractures at T11, T12, L1, L2 and L4. There is chronic vertebral body height loss at multiple levels, including T4, T5, T6 and T9. There is no bone marrow edema. Cord:  Normal signal and morphology Paraspinal and other soft tissues: Negative Disc levels: T5-6: There is mild narrowing of the thecal sac due to mild facet hypertrophy. No anteroposterior spinal canal stenosis. T11-12: Minimal retropulsion of the posterosuperior corner of T12 minimally narrows the ventral thecal sac. There is no central spinal canal or neural foraminal stenosis of the thoracic spine. IMPRESSION: 1. No acute abnormality of the thoracic spine. No spinal canal or neural foraminal stenosis. 2. Multiple chronic compression deformities with findings of prior vertebral augmentation at T11-L2 and L4. Electronically Signed   By: Ulyses Jarred M.D.   On: 05/31/2019 19:01   CT T-SPINE NO CHARGE  Result Date: 06/16/2019 CLINICAL DATA:  Thoracic spine pain and shortness of breath beginning this morning and worsening. No known injury. History of prior fractures. EXAM: CT THORACIC SPINE WITHOUT CONTRAST TECHNIQUE: Multidetector CT images of the thoracic were obtained using the standard protocol without  intravenous contrast. COMPARISON:  MRI thoracic spine 05/31/2019. CT thoracic spine 05/30/2019. FINDINGS: Alignment: There is some exaggeration of the normal thoracic kyphosis but no listhesis. Vertebrae: The patient is status post vertebral  augmentation at T11, T12, L1 and L2. Compression fractures at T2, T4, T5, T6, T9 and T10 are again seen. Vertebral body height loss at T5 has worsened since the prior examinations with near vertebra plana deformity now present. New fracture is identified. Bones are osteopenic. No lytic or sclerotic lesion. Paraspinal and other soft tissues: See report of dedicated chest CT this same day. Disc levels: The appearance is unchanged compared to the prior MRI. The central canal and foramina are open at all levels. Mild bony retropulsion off the superior endplate of [id number] is noted. IMPRESSION: Multiple thoracic spine compression fractures. Vertebral body height loss at T5 has worsened since the most recent examination and is now up to near 90%. The patient's fractures are otherwise unchanged in appearance. No new fracture is identified. The central canal and foramina appear open at each level. Electronically Signed   By: Inge Rise M.D.   On: 06/16/2019 14:21   DG C-Arm 1-60 Min  Result Date: 06/19/2019 CLINICAL DATA:  Vertebral body fracture.  Kyphoplasty. EXAM: DG C-ARM 1-60 MIN FLUOROSCOPY TIME:  Fluoroscopy Time:  1 minutes 36 seconds. Radiation Exposure Index (if provided by the fluoroscopic device): 11.7 mGy IMPRESSION: C-arm imaging provided for kyphoplasty. Electronically Signed   By: Lorriane Shire M.D.   On: 06/19/2019 12:49   CT ANGIO CHEST AORTA W/CM & OR WO/CM  Result Date: 05/30/2019 CLINICAL DATA:  74 year old female with back pain. EXAM: CT ANGIOGRAPHY CHEST WITH CONTRAST TECHNIQUE: Multidetector CT imaging of the chest was performed using the standard protocol during bolus administration of intravenous contrast. Multiplanar CT image reconstructions and MIPs were obtained to evaluate the vascular anatomy. CONTRAST:  64mL OMNIPAQUE IOHEXOL 350 MG/ML SOLN COMPARISON:  Chest CT dated 05/06/2015. FINDINGS: Evaluation is limited due to streak artifact caused by patient's arms. Cardiovascular: There is  no cardiomegaly or pericardial effusion. There is moderate atherosclerotic calcification of the thoracic aorta. No aneurysmal dilatation or dissection. The origins of the great vessels of the aortic arch appear patent as visualized. The central pulmonary arteries are patent for the degree of opacification. Mediastinum/Nodes: There is no hilar or mediastinal adenopathy. There is a small hiatal hernia. The esophagus is grossly unremarkable. No mediastinal fluid collection. Lungs/Pleura: There is background of centrilobular emphysema. Linear scarring in the lingula. There is no focal consolidation, pleural effusion, or pneumothorax. The central airways are patent. Upper Abdomen: No acute abnormality. Musculoskeletal: Osteopenia with extensive multilevel degenerative changes of the spine and multilevel compression fractures and lower thoracic multilevel vertebroplasty. Mild compression fracture of the superior endplate of T2 and T4 and T9 appear new since the prior CT. Correlation with clinical exam and point tenderness recommended. No retropulsed fragment. Review of the MIP images confirms the above findings. IMPRESSION: 1. No CT evidence of aortic dissection or aneurysm. 2. Osteopenia with multilevel compression fractures and multilevel vertebroplasty. Mild compression deformities of the superior endplates of T2 and T4 and T9, new since the prior CT. Correlation with clinical exam and point tenderness recommended. No retropulsed fragment. 3. Aortic Atherosclerosis (ICD10-I70.0). Electronically Signed   By: Anner Crete M.D.   On: 05/30/2019 01:35      Labs: BNP (last 3 results) No results for input(s): BNP in the last 8760 hours. Basic Metabolic Panel: Recent Labs  Lab 06/16/19 1219 06/17/19  0309 06/18/19 0420 06/19/19 0332  NA 133* 136 135 134*  K 4.1 4.8 4.3 3.9  CL 97* 99 99 97*  CO2 27 28 30 28   GLUCOSE 123* 101* 92 87  BUN 16 20 16 16   CREATININE 0.94 1.09* 0.96 0.85  CALCIUM 8.7* 8.8*  8.7* 8.4*  MG  --  2.6* 2.4 2.4   Liver Function Tests: Recent Labs  Lab 06/16/19 1219  AST 21  ALT 18  ALKPHOS 59  BILITOT 0.5  PROT 6.7  ALBUMIN 3.7   Recent Labs  Lab 06/16/19 1219  LIPASE 25   No results for input(s): AMMONIA in the last 168 hours. CBC: Recent Labs  Lab 06/16/19 1219 06/17/19 0309 06/18/19 0420 06/19/19 0332  WBC 16.4* 15.8* 11.6* 10.0  HGB 8.7* 8.2* 8.4* 7.9*  HCT 28.1* 26.0* 26.1* 25.5*  MCV 93.0 91.5 91.6 91.7  PLT 520* 459* 478* 488*   Cardiac Enzymes: No results for input(s): CKTOTAL, CKMB, CKMBINDEX, TROPONINI in the last 168 hours. BNP: Invalid input(s): POCBNP CBG: No results for input(s): GLUCAP in the last 168 hours. D-Dimer No results for input(s): DDIMER in the last 72 hours. Hgb A1c No results for input(s): HGBA1C in the last 72 hours. Lipid Profile No results for input(s): CHOL, HDL, LDLCALC, TRIG, CHOLHDL, LDLDIRECT in the last 72 hours. Thyroid function studies No results for input(s): TSH, T4TOTAL, T3FREE, THYROIDAB in the last 72 hours.  Invalid input(s): FREET3 Anemia work up No results for input(s): VITAMINB12, FOLATE, FERRITIN, TIBC, IRON, RETICCTPCT in the last 72 hours. Urinalysis    Component Value Date/Time   COLORURINE STRAW (A) 08/06/2017 1121   APPEARANCEUR CLEAR (A) 08/06/2017 1121   LABSPEC 1.004 (L) 08/06/2017 1121   PHURINE 7.0 08/06/2017 1121   GLUCOSEU NEGATIVE 08/06/2017 1121   HGBUR SMALL (A) 08/06/2017 1121   BILIRUBINUR NEGATIVE 08/06/2017 1121   KETONESUR 20 (A) 08/06/2017 1121   PROTEINUR NEGATIVE 08/06/2017 1121   NITRITE NEGATIVE 08/06/2017 1121   LEUKOCYTESUR NEGATIVE 08/06/2017 1121   Sepsis Labs Invalid input(s): PROCALCITONIN,  WBC,  LACTICIDVEN Microbiology No results found for this or any previous visit (from the past 240 hour(s)).   Total time spend on discharging this patient, including the last patient exam, discussing the hospital stay, instructions for ongoing care as it  relates to all pertinent caregivers, as well as preparing the medical discharge records, prescriptions, and/or referrals as applicable, is 30 minutes.    Enzo Bi, MD  [facility] 06/19/2019, 3:28 PM  If 7PM-7AM, please contact night-coverage

## 2019-06-19 NOTE — Progress Notes (Signed)
Discharge instructions reviewed with patient and her daughter. They verbalized understanding. IV removed. Patient in stable condition. She was wheeled out to meet daughter by NT.

## 2019-06-19 NOTE — Anesthesia Postprocedure Evaluation (Signed)
Anesthesia Post Note  Patient: Delbert Phenix  Procedure(s) Performed: KYPHOPLASTY T5 (N/A )  Patient location during evaluation: PACU Anesthesia Type: General Level of consciousness: awake and alert Pain management: pain level controlled Vital Signs Assessment: post-procedure vital signs reviewed and stable Respiratory status: spontaneous breathing, nonlabored ventilation, respiratory function stable and patient connected to nasal cannula oxygen Cardiovascular status: blood pressure returned to baseline and stable Postop Assessment: no apparent nausea or vomiting Anesthetic complications: no     Last Vitals:  Vitals:   06/19/19 1401 06/19/19 1459  BP: 129/62 128/70  Pulse: (!) 102 (!) 108  Resp: 20 20  Temp: 36.5 C 36.6 C  SpO2: 90% 93%    Last Pain:  Vitals:   06/19/19 1459  TempSrc: Oral  PainSc:                  Martha Clan

## 2019-06-19 NOTE — Plan of Care (Signed)

## 2019-06-19 NOTE — Anesthesia Preprocedure Evaluation (Signed)
Anesthesia Evaluation  Patient identified by MRN, date of birth, ID band Patient awake    Reviewed: Allergy & Precautions, NPO status , Patient's Chart, lab work & pertinent test results  History of Anesthesia Complications Negative for: history of anesthetic complications  Airway Mallampati: III       Dental  (+) Edentulous Upper, Edentulous Lower   Pulmonary neg sleep apnea, COPD, neg recent URI, Not current smoker, former smoker,           Cardiovascular (-) hypertension(-) angina(-) Past MI and (-) CHF (-) dysrhythmias (-) Valvular Problems/Murmurs     Neuro/Psych neg Seizures Anxiety    GI/Hepatic Neg liver ROS, GERD  Medicated and Controlled,  Endo/Other  neg diabetes  Renal/GU negative Renal ROS     Musculoskeletal   Abdominal   Peds  Hematology  (+) Blood dyscrasia, anemia ,   Anesthesia Other Findings Past Medical History: No date: Anemia No date: Anxiety No date: Arthritis     Comment:  Rheumatoid No date: Atony of gallbladder No 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[place])     Comment:  unable to see No date: Centrilobular emphysema (HCC) No date: Dyspnea No date: Fatty liver No date: GERD (gastroesophageal reflux disease) No date: History of fractured vertebra     Comment:  sees chiropractor No date: 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[place]) No date: Iron (Fe) deficiency anemia No date: Migraine headache     Comment:  in past No date: Migraines No date: Motion sickness No date: No natural teeth No date: Osteoporosis No date: Rheumatoid arthritis (HCC) No date: Vertigo   Reproductive/Obstetrics negative OB ROS                             Anesthesia Physical  Anesthesia Plan  ASA: III  Anesthesia Plan: General   Post-op Pain Management:    Induction: Intravenous  PONV Risk Score and Plan: TIVA, Propofol infusion and Treatment may vary due to age or  medical condition  Airway Management Planned: Nasal Cannula  Additional Equipment:   Intra-op Plan:   Post-operative Plan:   Informed Consent: I have reviewed the patients History and Physical, chart, labs and discussed the procedure including the risks, benefits and alternatives for the proposed anesthesia with the patient or authorized representative who has indicated his/her understanding and acceptance.       Plan Discussed with:   Anesthesia Plan Comments:         Anesthesia Quick Evaluation

## 2019-06-29 ENCOUNTER — Other Ambulatory Visit: Payer: Self-pay

## 2019-06-29 ENCOUNTER — Inpatient Hospital Stay: Payer: Medicare Other | Attending: Hematology and Oncology

## 2019-06-29 DIAGNOSIS — D509 Iron deficiency anemia, unspecified: Secondary | ICD-10-CM

## 2019-06-29 LAB — CBC WITH DIFFERENTIAL/PLATELET
Abs Immature Granulocytes: 0.09 10*3/uL — ABNORMAL HIGH (ref 0.00–0.07)
Basophils Absolute: 0.1 10*3/uL (ref 0.0–0.1)
Basophils Relative: 1 %
Eosinophils Absolute: 0.1 10*3/uL (ref 0.0–0.5)
Eosinophils Relative: 1 %
HCT: 31.6 % — ABNORMAL LOW (ref 36.0–46.0)
Hemoglobin: 9.7 g/dL — ABNORMAL LOW (ref 12.0–15.0)
Immature Granulocytes: 1 %
Lymphocytes Relative: 9 %
Lymphs Abs: 1 10*3/uL (ref 0.7–4.0)
MCH: 27.3 pg (ref 26.0–34.0)
MCHC: 30.7 g/dL (ref 30.0–36.0)
MCV: 89 fL (ref 80.0–100.0)
Monocytes Absolute: 0.9 10*3/uL (ref 0.1–1.0)
Monocytes Relative: 8 %
Neutro Abs: 9.4 10*3/uL — ABNORMAL HIGH (ref 1.7–7.7)
Neutrophils Relative %: 80 %
Platelets: 496 10*3/uL — ABNORMAL HIGH (ref 150–400)
RBC: 3.55 MIL/uL — ABNORMAL LOW (ref 3.87–5.11)
RDW: 16.8 % — ABNORMAL HIGH (ref 11.5–15.5)
WBC: 11.6 10*3/uL — ABNORMAL HIGH (ref 4.0–10.5)
nRBC: 0 % (ref 0.0–0.2)

## 2019-06-29 LAB — FERRITIN: Ferritin: 26 ng/mL (ref 11–307)

## 2019-06-30 ENCOUNTER — Telehealth: Payer: Self-pay

## 2019-06-30 NOTE — Telephone Encounter (Signed)
-----   Message from Lequita Asal, MD sent at 06/30/2019  1:13 PM EDT ----- Regarding: Please schedule for Venofer x 3  Ferritin is 26.  ----- Message ----- From: Interface, Lab In Sun[facility] Sent: 06/29/2019   9:30 AM EDT To: Lequita Asal, MD

## 2019-06-30 NOTE — Telephone Encounter (Signed)
Spoke with the patient to inform her that per Dr Mike Gip her Ferritin has dropped down to 26, per Dr Mike Gip the patient need to come in and get Venofer x 3. Ms Shirlean Mylar has been notified.The patient was understanding and agreeable.

## 2019-07-05 ENCOUNTER — Inpatient Hospital Stay: Payer: Medicare Other

## 2019-07-05 ENCOUNTER — Other Ambulatory Visit: Payer: Self-pay

## 2019-07-05 VITALS — BP 156/87 | HR 97 | Temp 98.0°F | Resp 18

## 2019-07-05 DIAGNOSIS — E538 Deficiency of other specified B group vitamins: Secondary | ICD-10-CM

## 2019-07-05 DIAGNOSIS — D509 Iron deficiency anemia, unspecified: Secondary | ICD-10-CM | POA: Diagnosis not present

## 2019-07-05 MED ORDER — IRON SUCROSE 20 MG/ML IV SOLN
200.0000 mg | Freq: Once | INTRAVENOUS | Status: AC
  Administered 2019-07-05: 13:00:00 200 mg via INTRAVENOUS

## 2019-07-05 MED ORDER — SODIUM CHLORIDE 0.9 % IV SOLN: 200.0000 mg | Freq: Once | INTRAVENOUS | Status: DC

## 2019-07-05 MED ORDER — SODIUM CHLORIDE 0.9 % IV SOLN
Freq: Once | INTRAVENOUS | Status: AC
  Filled 2019-07-05: qty 250

## 2019-07-05 NOTE — Patient Instructions (Signed)

## 2019-07-13 ENCOUNTER — Inpatient Hospital Stay: Payer: Medicare Other

## 2019-07-13 ENCOUNTER — Other Ambulatory Visit: Payer: Self-pay

## 2019-07-13 VITALS — BP 145/82 | HR 102 | Temp 98.5°F | Resp 19

## 2019-07-13 DIAGNOSIS — D509 Iron deficiency anemia, unspecified: Secondary | ICD-10-CM

## 2019-07-13 DIAGNOSIS — E538 Deficiency of other specified B group vitamins: Secondary | ICD-10-CM

## 2019-07-13 MED ORDER — SODIUM CHLORIDE 0.9 % IV SOLN
Freq: Once | INTRAVENOUS | Status: AC
  Filled 2019-07-13: qty 250

## 2019-07-13 MED ORDER — IRON SUCROSE 20 MG/ML IV SOLN
200.0000 mg | Freq: Once | INTRAVENOUS | Status: AC
  Administered 2019-07-13: 11:00:00 200 mg via INTRAVENOUS

## 2019-07-13 MED ORDER — SODIUM CHLORIDE 0.9 % IV SOLN: 200.0000 mg | Freq: Once | INTRAVENOUS | Status: DC

## 2019-07-13 NOTE — Patient Instructions (Signed)

## 2019-07-19 ENCOUNTER — Other Ambulatory Visit: Payer: Self-pay | Admitting: Hematology and Oncology

## 2019-07-20 ENCOUNTER — Other Ambulatory Visit: Payer: Self-pay

## 2019-07-20 ENCOUNTER — Inpatient Hospital Stay: Payer: Medicare Other | Attending: Hematology and Oncology

## 2019-07-20 VITALS — BP 156/81 | HR 90 | Temp 97.4°F | Resp 20

## 2019-07-20 DIAGNOSIS — E538 Deficiency of other specified B group vitamins: Secondary | ICD-10-CM

## 2019-07-20 DIAGNOSIS — D509 Iron deficiency anemia, unspecified: Secondary | ICD-10-CM | POA: Insufficient documentation

## 2019-07-20 MED ORDER — SODIUM CHLORIDE 0.9 % IV SOLN: 200.0000 mg | Freq: Once | INTRAVENOUS | Status: DC

## 2019-07-20 MED ORDER — SODIUM CHLORIDE 0.9 % IV SOLN
Freq: Once | INTRAVENOUS | Status: AC
  Filled 2019-07-20: qty 250

## 2019-07-20 MED ORDER — IRON SUCROSE 20 MG/ML IV SOLN
200.0000 mg | Freq: Once | INTRAVENOUS | Status: AC
  Administered 2019-07-20: 200 mg via INTRAVENOUS
  Filled 2019-07-20: qty 10

## 2019-07-20 NOTE — Patient Instructions (Signed)

## 2019-07-21 ENCOUNTER — Other Ambulatory Visit: Payer: Self-pay | Admitting: Orthopedic Surgery

## 2019-07-21 DIAGNOSIS — S22000A Wedge compression fracture of unspecified thoracic vertebra, initial encounter for closed fracture: Secondary | ICD-10-CM

## 2019-07-22 ENCOUNTER — Ambulatory Visit
Admission: RE | Admit: 2019-07-22 | Discharge: 2019-07-22 | Disposition: A | Discharge status: Home / Self Care | Payer: Medicare Other | Source: Ambulatory Visit | Attending: Orthopedic Surgery | Admitting: Orthopedic Surgery

## 2019-07-22 DIAGNOSIS — S22000A Wedge compression fracture of unspecified thoracic vertebra, initial encounter for closed fracture: Secondary | ICD-10-CM | POA: Diagnosis present

## 2019-08-01 NOTE — Progress Notes (Signed)
[facility]  [address], [address] [place], [place] [address] Phone: [phone]  Fax: [phone]   Clinic Day:  08/03/2019  Referring physician: Kirk Ruths, MD  Chief Complaint: Ashley Mack is a 74 y.o. female with  iron deficiency anemia and B12 deficiency who is seen for assessment after interval hospitalization.    HPI: The patient was last seen in the hematology clinic on 06/08/2019 via telemedicine. At that time, she felt tired and had exertional shortness of breath.  Hematocrit was 30.8, hemoglobin 9.7, MCV 89.5, platelets 491,000, WBC 15,500 (ANC 12,800). Ferritin was 60 with an iron saturation of 8% and a TIBC of 323. Folate was 8.0.   She was admitted to [facility] from 06/16/2019 - 06/19/2019 for back pain. Thoracic spine MRI on 06/16/2019 revealed a new severe T5 compression fracture. Patient received oxycodone 5 mg as needed, and IV morphine as needed for pain control. Patient underwent kyphoplasty with Dr. Rudene Christians on 06/19/2019.   Patient had a follow up with Allene Dillon, NP in physical medicine and rehabilitation on 06/29/2019. She had moderate to good relief of her sharp pain in the upper back following kyphoplasty. She continued to have radiating pain around the rib cage. Rig cage was sore to touch. She was taking prednisone 5 mg. She felt nauseous. Patient held off on an epidural steroid injection. Follow up planned in 4 weeks.   Labs on 06/29/2019 showed hematocrit 31.6, hemoglobin 9.7, MCV 89.0, platelets 496,000, WBC 11,600 (ANC 9,400). Ferritin was 26.   Patient received Venofer x 3 (07/05/2019 - 07/20/2019).   During the interim, the patient was doing ok. She can tell her blood count are better. She notes shortness of breath and trouble with taking deep breaths. She is eating well. She is performing ADLs. She is reportedly getting over COVID-19 symptoms since 04/2019. She is seeing the chiropractor for muscle aches.    Past Medical  History:  Diagnosis Date  . Anemia   . Anxiety   . Arthritis    Rheumatoid  . Atony of gallbladder   . 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 Centrilobular emphysema ([place])   . Dyspnea   . Fatty liver   . GERD (gastroesophageal reflux disease)   . History of fractured vertebra    sees chiropractor  . 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 Iron (Fe) deficiency anemia   . Migraine headache    in past  . Migraines   . Motion sickness   . 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 Osteoporosis   . Rheumatoid arthritis ([place])   . Vertigo     Past Surgical History:  Procedure Laterality Date  . CATARACT EXTRACTION W/PHACO Right 08/12/2016   Procedure: CATARACT EXTRACTION PHACO AND INTRAOCULAR LENS PLACEMENT ([place])  Right;  Surgeon: Leandrew Koyanagi, MD;  Location: [place];  Service: Ophthalmology;  Laterality: Right;  . CATARACT EXTRACTION W/PHACO Left 09/30/2016   Procedure: CATARACT EXTRACTION PHACO AND INTRAOCULAR LENS PLACEMENT ([place]) Left;  Surgeon: Leandrew Koyanagi, MD;  Location: [facility] [place];  Service: Ophthalmology;  Laterality: Left;  . COLONOSCOPY    . COLONOSCOPY WITH PROPOFOL N/A 12/09/2016   Procedure: COLONOSCOPY WITH PROPOFOL;  Surgeon: Toledo, Benay Pike, MD;  Location: [facility];  Service: Endoscopy;  Laterality: N/A;  . ESOPHAGOGASTRODUODENOSCOPY    . ESOPHAGOGASTRODUODENOSCOPY (EGD) WITH PROPOFOL N/A 12/09/2016   Procedure: ESOPHAGOGASTRODUODENOSCOPY (EGD) WITH PROPOFOL;  Surgeon: Alice Reichert, Benay Pike, MD;  Location:  [facility];  Service: Endoscopy;  Laterality: N/A;  . KYPHOPLASTY N/A 08/10/2017   Procedure: Hewitt Shorts;  Surgeon: Hessie Knows, MD;  Location: [facility];  Service: Orthopedics;  Laterality: N/A;  . KYPHOPLASTY N/A 09/09/2017   Procedure: [id number];  Surgeon: Hessie Knows, MD;  Location: [facility];  Service: Orthopedics;  Laterality: N/A;  . KYPHOPLASTY N/A 09/28/2017   Procedure: [id number];   Surgeon: Hessie Knows, MD;  Location: [facility];  Service: Orthopedics;  Laterality: N/A;  . KYPHOPLASTY N/A 06/19/2019   Procedure: KYPHOPLASTY T5;  Surgeon: Hessie Knows, MD;  Location: [facility];  Service: Orthopedics;  Laterality: N/A;  . OOPHORECTOMY Bilateral 2003    Family History  Problem Relation Age of Onset  . Kidney disease Mother   . Diabetes Mellitus II Brother     Social History:  reports that she quit smoking about 12 years ago. Her smoking use included cigarettes. She smoked 1.00 pack per day. She has never used smokeless tobacco. She reports that she does not drink alcohol or use drugs. She quit smoking in 2009. She lives in [place].Her step Tax adviser. The patient is accompanied by [employer] today.  Allergies:  Allergies  Allergen Reactions  . Methotrexate Derivatives Nausea And Vomiting and Other (See Comments)    Flu like symptoms  . Other Nausea Only and Other (See Comments)    Arthritis medications . Unsure of what the med was.  . Ranitidine Hives  . Sulfasalazine Other (See Comments)    Stomach upset. Unsure of reaction. Long time ago  . Topiramate Nausea Only    Current Medications: Current Outpatient Medications  Medication Sig Dispense Refill  . albuterol (VENTOLIN HFA) 108 (90 Base) MCG/ACT inhaler Inhale 2 puffs into the lungs every 6 (six) hours as needed for wheezing or shortness of breath.    . budesonide-formoterol (SYMBICORT) 80-4.5 MCG/ACT inhaler Inhale 2 puffs into the lungs daily.     . Cholecalciferol (VITAMIN D3) 2000 UNITS capsule Take 2,000 Units by mouth daily.     . cyanocobalamin (,VITAMIN B-12,) 1000 MCG/ML injection INJECT 1ML INTO THE MUSCLE EVERY 30 DAYS 3 mL 0  . cyclobenzaprine (FLEXERIL) 10 MG tablet Take 5-10 mg by mouth 3 (three) times daily as needed for muscle spasms.     . DEXILANT 60 MG capsule Take 60 mg by mouth daily.     . Fe Fum-FePoly-Vit C-Vit B3 (INTEGRA) 62.5-62.5-40-3 MG CAPS Take 1 capsule by mouth daily.     . polyethylene glycol (MIRALAX) 17 g packet Dissolve 34 g Miralax in 8 oz liquid, take it every 2 hours until you start having bowel movement. 14 each 0  . predniSONE (DELTASONE) 5 MG tablet Take 5 mg by mouth daily. Currently taking 2.5 mg qd    . fluticasone (FLONASE) 50 MCG/ACT nasal spray Place 1-2 sprays into both nostrils daily as needed for allergies or rhinitis.      No current facility-administered medications for this visit.    Review of Systems  Constitutional: Negative for chills, diaphoresis, fever, malaise/fatigue and weight loss.       Doing ok.  HENT: Negative for congestion, ear discharge, ear pain, hearing loss, nosebleeds, sinus pain, sore throat and tinnitus.   Eyes: Negative for blurred vision.  Respiratory: Positive for shortness of breath (exertional). Negative for cough, hemoptysis and sputum production.   Cardiovascular: Negative for chest pain, palpitations and leg swelling.  Gastrointestinal: Negative for abdominal pain, blood in stool, constipation, diarrhea, heartburn, melena, nausea and vomiting.  Genitourinary: Negative  for dysuria, frequency, hematuria and urgency.  Musculoskeletal: Positive for myalgias (intermittent). Negative for back pain, joint pain and neck pain.       Patient s/p 5 kyphoplasties since 04/2017.  Skin: Negative for itching and rash.       Fragile skin.  Neurological: Negative for dizziness, tingling, sensory change, weakness and headaches.  Endo/Heme/Allergies: Does not bruise/bleed easily.  Psychiatric/Behavioral: Negative for depression and memory loss. The patient is not nervous/anxious and does not have insomnia.   All other systems reviewed and are negative.   Performance status (ECOG): 1  Vitals Blood pressure (!) 155/91, pulse (!) 103, temperature 98.1 F (36.7 C), resp. rate 18, weight 138 lb 5.4 oz (62.8 kg).   Physical Exam  Constitutional: She is oriented to person, place, and time. She appears well-developed and  well-nourished. No distress.  HENT:  Head: Normocephalic and atraumatic.  Mouth/Throat: Oropharynx is clear and moist. No oropharyngeal exudate.  Short brown hair.  Eyes: Pupils are equal, round, and reactive to light. Conjunctivae and EOM are normal. No scleral icterus.  Glasses. Blue eyes.  Cardiovascular: Normal rate, regular rhythm and normal heart sounds.  No murmur heard. Pulmonary/Chest: Effort normal. No respiratory distress. She has wheezes (soft). She has no rales. She exhibits no tenderness.  Abdominal: Soft. Bowel sounds are normal. She exhibits no distension and no mass. There is no abdominal tenderness. There is no rebound and no guarding.  Musculoskeletal:        General: No tenderness or edema. Normal range of motion.     Cervical back: Normal range of motion and neck supple.  Lymphadenopathy:    She has no cervical adenopathy.    She has no axillary adenopathy.       Right: No supraclavicular adenopathy present.       Left: No supraclavicular adenopathy present.  Neurological: She is alert and oriented to person, place, and time.  Skin: Skin is warm and dry. She is not diaphoretic.  Psychiatric: She has a normal mood and affect. Her behavior is normal. Judgment and thought content normal.  Nursing note and vitals reviewed.  Appointment on 08/02/2019  Component Date Value Ref Range Status  . Ferritin 08/02/2019 168  11 - 307 ng/mL Final   Performed at [facility], [address] [place],  [address]  . WBC 08/02/2019 11.5* 4.0 - 10.5 K/uL Final  . RBC 08/02/2019 3.96  3.87 - 5.11 MIL/uL Final  . Hemoglobin 08/02/2019 11.2* 12.0 - 15.0 g/dL Final  . HCT 08/02/2019 34.9* 36.0 - 46.0 % Final  . MCV 08/02/2019 88.1  80.0 - 100.0 fL Final  . MCH 08/02/2019 28.3  26.0 - 34.0 pg Final  . MCHC 08/02/2019 32.1  30.0 - 36.0 g/dL Final  . RDW 08/02/2019 17.6* 11.5 - 15.5 % Final  . Platelets 08/02/2019 415* 150 - 400 K/uL Final  . nRBC 08/02/2019 0.0  0.0 -  0.2 % Final  . Neutrophils Relative % 08/02/2019 75  % Final  . Neutro Abs 08/02/2019 8.7* 1.7 - 7.7 K/uL Final  . Lymphocytes Relative 08/02/2019 15  % Final  . Lymphs Abs 08/02/2019 1.7  0.7 - 4.0 K/uL Final  . Monocytes Relative 08/02/2019 7  % Final  . Monocytes Absolute 08/02/2019 0.8  0.1 - 1.0 K/uL Final  . Eosinophils Relative 08/02/2019 1  % Final  . Eosinophils Absolute 08/02/2019 0.1  0.0 - 0.5 K/uL Final  . Basophils Relative 08/02/2019 1  % Final  .  Basophils Absolute 08/02/2019 0.1  0.0 - 0.1 K/uL Final  . Immature Granulocytes 08/02/2019 1  % Final  . Abs Immature Granulocytes 08/02/2019 0.07  0.00 - 0.07 K/uL Final   Performed at [facility], [address], [place], [place] [address]    Assessment:  Ashley Mack is a 74 y.o. female with iron deficiency anemiaand B12 deficiency. Dietis good. She was on oral iron(Integra) for years. She is unsure of any melena or hematochezia as she can not see well.   Work-up on 06/18/2018revealed a hematocrit 30.6, hemoglobin 9.9, and MCV 83.2. B12 was 90. Ferritin was 13. Sed rate was 45 (elevated). Retic was 2.6%. Normal studiesincluded the following: [place], folate, iron saturation, TIBC, SPEP, and free light chain ratio. Normal studieson 07/29/2016 included: creatinine, calcium, albumin, protein, liver function tests, and TSH.  She had acolonoscopyon 03/18/2009. She has had several EGDswith the last on 10/12/2008. She states that she has had polyps, reflux and a hiatal hernia. EGD on 12/09/2016 was normal, other than demonstrating a small hiatal hernia. Colonoscopyon 12/09/2016 demonstrated friability with contact bleeding in the proximal transverse colon. A 1 mm sessile polyp was found in the sigmoid colon, in addition to grade II non-bleeding internal hemorrhoids were also noted. Pathology results demonstrated a tubular adenoma that was negative for dyplasia or malignancy. There was marked active  ischemic colitis.   She began weekly B12on 09/08/2016 and continued x 6 weeks (last 10/16/2016). She began monthly B12on 11/26/2016 (last 02/17/2018).Folatewas 9.5 on 03/01/2019.  She received Venoferon 09/08/2016, 09/15/2016, 09/25/2016, 11/26/2016, 12/16/2016, 12/24/2016, x3 (05/27/2017 - 06/10/2017), x2 (03/03/2018 - 03/10/2018), x3 (06/22/2018 - 07/06/2018), x 2 (08/31/2018 - 09/07/2018), x 4 (03/02/2019 - 03/23/2019), and x 3 (07/05/2019 - 07/20/2019).She receives Venofer if her ferritin is <30.  Ferritinhas been followed: 16 on 11/26/2016, 34 on 03/01/2017, 15 on 05/26/2017, 59 on 07/08/2017, 56 on 08/25/2017, 15 on 02/28/2018, 17 on 06/21/2018, 35on 08/30/2018, 32 on 12/01/2018, 20 on 03/01/2019, 60 on 06/07/2019, 26 on 06/29/2019 and 168 on 08/02/2019.   She has rheumatoid arthritis. She has been treated with methotrexate, Imuran, leflunomide, and sulfasalazine. She is on prednisone 5 mg a day x 2 years. She is s/p kyphoplasty x 5 since 04/2017.  Patient tested positive for COVID-19 on 04/28/2019.  Symptomatically, she is doing ok. She can tell her blood count are better. She has chronic shortness of breath.  Exam is stable.  Plan: 1.   Review labs from 08/02/2019. 2. Iron deficiency Hematocrit 34.9. Hemoglobin 11.2. MCV  88.1.  Ferritin  168. Counts and iron stores have improved after Alroy Bailiff. 3. B12 deficiency Patient states she typically feels good after receiving B12. She received B12 around the first of thismonth/last week. Continue B12. 4.No Venofer today. 5.   RTC in 3 months for labs (CBC with diff, ferritin, sed rate). 6.   RTC in 6 months for MD assessment, labs (CBC with diff, ferritin, iron studies-day before) and+/-Venofer.  I discussed the assessment and treatment plan with the patient.  The patient was provided an opportunity to ask questions and all were  answered.  The patient agreed with the plan and demonstrated an understanding of the instructions.  The patient was advised to call back if the symptoms worsen or if the condition fails to improve as anticipated.   Lequita Asal, MD, PhD    08/03/2019, 11:09 AM  I, Selena Batten, am acting as scribe for [employer]. Mike Gip, MD, PhD.  I, Melissa C. Mike Gip, MD,  have reviewed the above documentation for accuracy and completeness, and I agree with the above.

## 2019-08-02 ENCOUNTER — Other Ambulatory Visit: Payer: Self-pay

## 2019-08-02 ENCOUNTER — Other Ambulatory Visit: Payer: Medicare Other

## 2019-08-02 ENCOUNTER — Inpatient Hospital Stay: Payer: Medicare Other

## 2019-08-02 DIAGNOSIS — D509 Iron deficiency anemia, unspecified: Secondary | ICD-10-CM | POA: Diagnosis not present

## 2019-08-02 LAB — CBC WITH DIFFERENTIAL/PLATELET
Abs Immature Granulocytes: 0.07 10*3/uL (ref 0.00–0.07)
Basophils Absolute: 0.1 10*3/uL (ref 0.0–0.1)
Basophils Relative: 1 %
Eosinophils Absolute: 0.1 10*3/uL (ref 0.0–0.5)
Eosinophils Relative: 1 %
HCT: 34.9 % — ABNORMAL LOW (ref 36.0–46.0)
Hemoglobin: 11.2 g/dL — ABNORMAL LOW (ref 12.0–15.0)
Immature Granulocytes: 1 %
Lymphocytes Relative: 15 %
Lymphs Abs: 1.7 10*3/uL (ref 0.7–4.0)
MCH: 28.3 pg (ref 26.0–34.0)
MCHC: 32.1 g/dL (ref 30.0–36.0)
MCV: 88.1 fL (ref 80.0–100.0)
Monocytes Absolute: 0.8 10*3/uL (ref 0.1–1.0)
Monocytes Relative: 7 %
Neutro Abs: 8.7 10*3/uL — ABNORMAL HIGH (ref 1.7–7.7)
Neutrophils Relative %: 75 %
Platelets: 415 10*3/uL — ABNORMAL HIGH (ref 150–400)
RBC: 3.96 MIL/uL (ref 3.87–5.11)
RDW: 17.6 % — ABNORMAL HIGH (ref 11.5–15.5)
WBC: 11.5 10*3/uL — ABNORMAL HIGH (ref 4.0–10.5)
nRBC: 0 % (ref 0.0–0.2)

## 2019-08-02 LAB — FERRITIN: Ferritin: 168 ng/mL (ref 11–307)

## 2019-08-03 ENCOUNTER — Ambulatory Visit: Payer: Medicare Other

## 2019-08-03 ENCOUNTER — Encounter: Payer: Self-pay | Admitting: Hematology and Oncology

## 2019-08-03 ENCOUNTER — Inpatient Hospital Stay: Payer: Medicare Other

## 2019-08-03 ENCOUNTER — Ambulatory Visit: Payer: Medicare Other | Admitting: Hematology and Oncology

## 2019-08-03 ENCOUNTER — Inpatient Hospital Stay (HOSPITAL_BASED_OUTPATIENT_CLINIC_OR_DEPARTMENT_OTHER): Payer: Medicare Other | Admitting: Hematology and Oncology

## 2019-08-03 VITALS — BP 155/91 | HR 103 | Temp 98.1°F | Resp 18 | Wt 138.3 lb

## 2019-08-03 DIAGNOSIS — D509 Iron deficiency anemia, unspecified: Secondary | ICD-10-CM

## 2019-08-03 DIAGNOSIS — E538 Deficiency of other specified B group vitamins: Secondary | ICD-10-CM | POA: Diagnosis not present

## 2019-08-03 NOTE — Progress Notes (Signed)
Patient denies new problems/concerns today.   °

## 2019-09-16 ENCOUNTER — Emergency Department: Payer: Medicare Other

## 2019-09-16 ENCOUNTER — Emergency Department
Admission: EM | Admit: 2019-09-16 | Discharge: 2019-09-16 | Disposition: A | Discharge status: Home / Self Care | Payer: Medicare Other | Attending: Emergency Medicine | Admitting: Emergency Medicine

## 2019-09-16 ENCOUNTER — Other Ambulatory Visit: Payer: Self-pay

## 2019-09-16 DIAGNOSIS — S24109A Unspecified injury at unspecified level of thoracic spinal cord, initial encounter: Secondary | ICD-10-CM | POA: Diagnosis present

## 2019-09-16 DIAGNOSIS — Y929 Unspecified place or not applicable: Secondary | ICD-10-CM | POA: Insufficient documentation

## 2019-09-16 DIAGNOSIS — X58XXXA Exposure to other specified factors, initial encounter: Secondary | ICD-10-CM | POA: Insufficient documentation

## 2019-09-16 DIAGNOSIS — Z87891 Personal history of nicotine dependence: Secondary | ICD-10-CM | POA: Diagnosis not present

## 2019-09-16 DIAGNOSIS — I10 Essential (primary) hypertension: Secondary | ICD-10-CM | POA: Insufficient documentation

## 2019-09-16 DIAGNOSIS — Y939 Activity, unspecified: Secondary | ICD-10-CM | POA: Insufficient documentation

## 2019-09-16 DIAGNOSIS — Y999 Unspecified external cause status: Secondary | ICD-10-CM | POA: Diagnosis not present

## 2019-09-16 DIAGNOSIS — S22000A Wedge compression fracture of unspecified thoracic vertebra, initial encounter for closed fracture: Secondary | ICD-10-CM

## 2019-09-16 LAB — CBC WITH DIFFERENTIAL/PLATELET
Abs Immature Granulocytes: 0.1 10*3/uL — ABNORMAL HIGH (ref 0.00–0.07)
Basophils Absolute: 0.1 10*3/uL (ref 0.0–0.1)
Basophils Relative: 1 %
Eosinophils Absolute: 0.1 10*3/uL (ref 0.0–0.5)
Eosinophils Relative: 1 %
HCT: 33.4 % — ABNORMAL LOW (ref 36.0–46.0)
Hemoglobin: 10.7 g/dL — ABNORMAL LOW (ref 12.0–15.0)
Immature Granulocytes: 1 %
Lymphocytes Relative: 10 %
Lymphs Abs: 1.3 10*3/uL (ref 0.7–4.0)
MCH: 28.5 pg (ref 26.0–34.0)
MCHC: 32 g/dL (ref 30.0–36.0)
MCV: 88.8 fL (ref 80.0–100.0)
Monocytes Absolute: 1 10*3/uL (ref 0.1–1.0)
Monocytes Relative: 8 %
Neutro Abs: 9.9 10*3/uL — ABNORMAL HIGH (ref 1.7–7.7)
Neutrophils Relative %: 79 %
Platelets: 440 10*3/uL — ABNORMAL HIGH (ref 150–400)
RBC: 3.76 MIL/uL — ABNORMAL LOW (ref 3.87–5.11)
RDW: 17.2 % — ABNORMAL HIGH (ref 11.5–15.5)
WBC: 12.4 10*3/uL — ABNORMAL HIGH (ref 4.0–10.5)
nRBC: 0 % (ref 0.0–0.2)

## 2019-09-16 LAB — COMPREHENSIVE METABOLIC PANEL
ALT: 22 U/L (ref 0–44)
AST: 23 U/L (ref 15–41)
Albumin: 3.7 g/dL (ref 3.5–5.0)
Alkaline Phosphatase: 67 U/L (ref 38–126)
Anion gap: 12 (ref 5–15)
BUN: 9 mg/dL (ref 8–23)
CO2: 28 mmol/L (ref 22–32)
Calcium: 8.6 mg/dL — ABNORMAL LOW (ref 8.9–10.3)
Chloride: 96 mmol/L — ABNORMAL LOW (ref 98–111)
Creatinine, Ser: 0.85 mg/dL (ref 0.44–1.00)
GFR calc Af Amer: >60 mL/min (ref >60)
GFR calc non Af Amer: >60 mL/min (ref >60)
Glucose, Bld: 111 mg/dL — ABNORMAL HIGH (ref 70–99)
Potassium: 3.1 mmol/L — ABNORMAL LOW (ref 3.5–5.1)
Sodium: 136 mmol/L (ref 135–145)
Total Bilirubin: 0.7 mg/dL (ref 0.3–1.2)
Total Protein: 6.7 g/dL (ref 6.5–8.1)

## 2019-09-16 LAB — TROPONIN I (HIGH SENSITIVITY)
Troponin I (High Sensitivity): 10 ng/L (ref <18)
Troponin I (High Sensitivity): 9 ng/L (ref <18)

## 2019-09-16 MED ORDER — ONDANSETRON HCL 4 MG/2ML IJ SOLN
4.0000 mg | Freq: Once | INTRAMUSCULAR | Status: AC
  Administered 2019-09-16: 4 mg via INTRAVENOUS
  Filled 2019-09-16: qty 2

## 2019-09-16 MED ORDER — HYDROMORPHONE HCL 1 MG/ML IJ SOLN
0.5000 mg | Freq: Once | INTRAMUSCULAR | Status: AC
  Administered 2019-09-16: 0.5 mg via INTRAVENOUS
  Filled 2019-09-16: qty 1

## 2019-09-16 MED ORDER — LIDOCAINE 5 % EX PTCH
1.0000 | MEDICATED_PATCH | CUTANEOUS | Status: DC
  Filled 2019-09-16: qty 1

## 2019-09-16 MED ORDER — LORAZEPAM 2 MG/ML IJ SOLN
1.0000 mg | Freq: Once | INTRAMUSCULAR | Status: AC
  Administered 2019-09-16: 1 mg via INTRAVENOUS
  Filled 2019-09-16: qty 1

## 2019-09-16 MED ORDER — POTASSIUM CHLORIDE CRYS ER 20 MEQ PO TBCR
40.0000 meq | EXTENDED_RELEASE_TABLET | Freq: Once | ORAL | Status: AC
  Administered 2019-09-16: 40 meq via ORAL
  Filled 2019-09-16: qty 2

## 2019-09-16 MED ORDER — HYDROCODONE-ACETAMINOPHEN 5-325 MG PO TABS: 1.0000 | ORAL_TABLET | Freq: Four times a day (QID) | ORAL | 0 refills | Status: AC | PRN

## 2019-09-16 MED ORDER — ONDANSETRON 4 MG PO TBDP
4.0000 mg | ORAL_TABLET | Freq: Three times a day (TID) | ORAL | 0 refills | Status: DC | PRN
Start: 2019-09-16 — End: 2019-10-04

## 2019-09-16 MED ORDER — ACETAMINOPHEN 500 MG PO TABS
1000.0000 mg | ORAL_TABLET | Freq: Once | ORAL | Status: AC
  Administered 2019-09-16: 1000 mg via ORAL
  Filled 2019-09-16: qty 2

## 2019-09-16 NOTE — ED Notes (Signed)
Pt on phone with MRI for screening questions  

## 2019-09-16 NOTE — ED Triage Notes (Signed)
Patient reports having back pain.  States history of collapsed vertebrae.

## 2019-09-16 NOTE — ED Notes (Signed)
This RN at bedside to ambulate pt per MD order. Pt able to ambulate with steady gait and stand by assistance. MD made aware.

## 2019-09-16 NOTE — ED Notes (Signed)
Pt given ice chips. Given ok by MD.

## 2019-09-16 NOTE — Discharge Instructions (Addendum)
Take the Zofran 30 minutes prior to taking the hydrocodone.  Call Dr. Rudene Christians office on Tuesday to schedule follow-up.  Return to the ER for any other concerns  IMPRESSION: 1. Acute to subacute mild T8 compression fracture, new since a May MRI. 10% loss of vertebral body height at that level with no retropulsion or complicating features.   2. 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   3. Stable T5 compression fracture augmentation with retropulsion resulting in mild spinal stenosis at that level.   4. 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

## 2019-09-16 NOTE — ED Notes (Signed)
Pt reports "flare-up" of her chronic back pain. Pt denies any recent injury, fall, or physical exertion to explain her s/x's.

## 2019-09-16 NOTE — ED Provider Notes (Signed)
[facility] Emergency Department Provider Note  ____________________________________________   First MD Initiated Contact with Patient 09/16/19 [phone]     (approximate)  I have reviewed the triage vital signs and the nursing notes.   HISTORY  Chief Complaint Back Pain    HPI Ashley Mack is a 74 y.o. female with migraines, headache who comes in with back pain.  Patient states that she is having severe back pain.  She states it feels similar to when she had her prior compression fractures.  Her pain is severe, constant, worse with movement, better with rest.  Reports the majority of pain is in the thoracic region but occasionally has some pain rating up into her neck into her lumbar region.  Denies any weakness in her legs or numbness.  States that it does hurt to breathe but is not really feel short of breath.  She states that she is had this issue previously when she had her thoracic compression fractures.  No chest pain.  Patient was last seen in April for similar and required admission.  At that time she required MRI to a prior MRI concerning for some cord edema with prior compression fracture.  Patient was taken to the OR with Dr. Rudene Christians for kyphoplasty.  Patient has been taking oxycodone without relief in symptoms.           Past Medical History:  Diagnosis Date  . Anemia   . Anxiety   . Arthritis    Rheumatoid  . [place] of gallbladder   . 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 Centrilobular emphysema ([place])   . Dyspnea   . Fatty liver   . GERD (gastroesophageal reflux disease)   . History of fractured vertebra    sees chiropractor  . 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 Iron (Fe) deficiency anemia   . Migraine headache    in past  . Migraines   . Motion sickness   . 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 Osteoporosis   . Rheumatoid arthritis ([place])   . Vertigo     Patient Active Problem List   Diagnosis Date Noted  . GERD  (gastroesophageal reflux disease) 06/18/2019  . 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[place]) 06/18/2019  . H/O kyphoplasty 06/18/2019  . Compression fracture of body of thoracic vertebra ([place]) 06/16/2019  . Flank pain   . Intractable back pain 08/08/2017  . Compression fracture of lumbar vertebra ([place]) 08/08/2017  . Abdominal pain, RUQ 08/06/2017  . Fatigue 05/29/2017  . Exertional dyspnea 05/29/2017  . Guaiac positive stools 11/26/2016  . Iron deficiency anemia 09/01/2016  . B12 deficiency 08/31/2016  . Normocytic anemia 08/30/2016  . Centrilobular emphysema ([place]) 05/09/2015  . HTN (hypertension), benign 12/10/2013    Past Surgical History:  Procedure Laterality Date  . CATARACT EXTRACTION W/PHACO Right 08/12/2016   Procedure: CATARACT EXTRACTION PHACO AND INTRAOCULAR LENS PLACEMENT ([place])  Right;  Surgeon: Leandrew Koyanagi, MD;  Location: [place];  Service: Ophthalmology;  Laterality: Right;  . CATARACT EXTRACTION W/PHACO Left 09/30/2016   Procedure: CATARACT EXTRACTION PHACO AND INTRAOCULAR LENS PLACEMENT ([place]) Left;  Surgeon: Leandrew Koyanagi, MD;  Location: [place];  Service: Ophthalmology;  Laterality: Left;  . COLONOSCOPY    . COLONOSCOPY WITH PROPOFOL N/A 12/09/2016   Procedure: COLONOSCOPY WITH PROPOFOL;  Surgeon: Toledo, Benay Pike, MD;  Location: [facility];  Service: Endoscopy;  Laterality: N/A;  . ESOPHAGOGASTRODUODENOSCOPY    .  ESOPHAGOGASTRODUODENOSCOPY (EGD) WITH PROPOFOL N/A 12/09/2016   Procedure: ESOPHAGOGASTRODUODENOSCOPY (EGD) WITH PROPOFOL;  Surgeon: Toledo, Benay Pike, MD;  Location: [facility];  Service: Endoscopy;  Laterality: N/A;  . KYPHOPLASTY N/A 08/10/2017   Procedure: Hewitt Shorts;  Surgeon: Hessie Knows, MD;  Location: [facility];  Service: Orthopedics;  Laterality: N/A;  . KYPHOPLASTY N/A 09/09/2017   Procedure: [id number]-N0,U7;  Surgeon: Hessie Knows, MD;  Location: [facility];  Service: Orthopedics;   Laterality: N/A;  . KYPHOPLASTY N/A 09/28/2017   Procedure: [id number];  Surgeon: Hessie Knows, MD;  Location: [facility];  Service: Orthopedics;  Laterality: N/A;  . KYPHOPLASTY N/A 06/19/2019   Procedure: KYPHOPLASTY T5;  Surgeon: Hessie Knows, MD;  Location: [facility];  Service: Orthopedics;  Laterality: N/A;  . OOPHORECTOMY Bilateral 2003    Prior to Admission medications   Medication Sig Start Date End Date Taking? Authorizing Provider  albuterol (VENTOLIN HFA) 108 (90 Base) MCG/ACT inhaler Inhale 2 puffs into the lungs every 6 (six) hours as needed for wheezing or shortness of breath.    [provider]  budesonide-formoterol (SYMBICORT) 80-4.5 MCG/ACT inhaler Inhale 2 puffs into the lungs daily.     [provider]  Cholecalciferol (VITAMIN D3) 2000 UNITS capsule Take 2,000 Units by mouth daily.     [provider]  cyanocobalamin (,VITAMIN B-12,) 1000 MCG/ML injection INJECT 1ML INTO THE MUSCLE EVERY 30 DAYS 07/19/19   Lequita Asal, MD  cyclobenzaprine (FLEXERIL) 10 MG tablet Take 5-10 mg by mouth 3 (three) times daily as needed for muscle spasms.     [provider]  DEXILANT 60 MG capsule Take 60 mg by mouth daily.  08/04/14   [provider]  Fe Fum-FePoly-Vit C-Vit B3 (INTEGRA) 62.5-62.5-40-3 MG CAPS Take 1 capsule by mouth daily. 11/29/13   [provider]  fluticasone (FLONASE) 50 MCG/ACT nasal spray Place 1-2 sprays into both nostrils daily as needed for allergies or rhinitis.     [provider]  polyethylene glycol (MIRALAX) 17 g packet Dissolve 34 g Miralax in 8 oz liquid, take it every 2 hours until you start having bowel movement. 06/19/19   Enzo Bi, MD  predniSONE (DELTASONE) 5 MG tablet Take 5 mg by mouth daily. Currently taking 2.5 mg qd    [provider]    Allergies Methotrexate derivatives, Other, Ranitidine, Sulfasalazine, and Topiramate  Family History  Problem Relation Age of Onset  .  Kidney disease Mother   . Diabetes Mellitus II Brother     Social History Social History   Tobacco Use  . Smoking status: Former Smoker    Packs/day: 1.00    Types: Cigarettes    Quit date: 2009    Years since quitting: 12.5  . Smokeless tobacco: Never Used  Vaping Use  . Vaping Use: Never used  Substance Use Topics  . Alcohol use: No  . Drug use: No      Review of Systems Constitutional: No fever/chills Eyes: No visual changes. ENT: No sore throat. Cardiovascular: Denies chest pain. Respiratory: Denies shortness of breath, some pain with breathing Gastrointestinal: No abdominal pain.  No nausea, no vomiting.  No diarrhea.  No constipation. Genitourinary: Negative for dysuria. Musculoskeletal: Positive for back pain Skin: Negative for rash. Neurological: Negative for headaches, focal weakness or numbness. All other ROS negative ____________________________________________   PHYSICAL EXAM:  VITAL SIGNS: ED Triage Vitals  Enc Vitals Group     BP 09/16/19 0625 138/78     Pulse --  Resp 09/16/19 0625 (!) 22     Temp 09/16/19 0625 97.9 F (36.6 C)     Temp Source 09/16/19 0625 Oral     SpO2 09/16/19 0625 96 %     Weight 09/16/19 0624 133 lb (60.3 kg)     Height 09/16/19 0624 5\' 2"  (1.575 m)     Head Circumference --      Peak Flow --      Pain Score 09/16/19 0623 10     Pain Loc --      Pain Edu? --      Excl. in [place]? --     Constitutional: Alert and oriented.  Patient appears to be in significant discomfort with any types of movement Eyes: Conjunctivae are normal. EOMI. Head: Atraumatic. Nose: No congestion/rhinnorhea. Mouth/Throat: Mucous membranes are moist.   Neck: No stridor. Trachea Midline. FROM Cardiovascular: Normal rate, regular rhythm. Grossly normal heart sounds.  Good peripheral circulation. Respiratory: Normal respiratory effort.  No retractions. Lungs CTAB. Gastrointestinal: Soft and nontender. No distention. No abdominal bruits.    Musculoskeletal: No lower extremity tenderness nor edema.  No joint effusions.  Difficulties testing strength due to patient's significant pain.  Sensation intact Neurologic:  Normal speech and language. No gross focal neurologic deficits are appreciated.  Skin:  Skin is warm, dry and intact. No rash noted. Psychiatric: Mood and affect are normal. Speech and behavior are normal. GU: Deferred  Back: Mostly T-spine tenderness but a little bit of lower C and upper L-spine tenderness as well.  No obvious step-offs or deformities.  Difficult getting a leg exam due to significant pain. ____________________________________________   LABS (all labs ordered are listed, but only abnormal results are displayed)  Labs Reviewed  CBC WITH DIFFERENTIAL/PLATELET  COMPREHENSIVE METABOLIC PANEL  URINALYSIS, COMPLETE (UACMP) WITH MICROSCOPIC  TROPONIN I (HIGH SENSITIVITY)   ____________________________________________   ED ECG REPORT I, Vanessa [place], the attending physician, personally viewed and interpreted this ECG.  EKG is normal sinus rhythm 93, no ST elevation, no T wave inversions, normal intervals ____________________________________________  RADIOLOGY   Official radiology report(s): MR Cervical Spine Wo Contrast  Result Date: 09/16/2019 CLINICAL DATA:  74 year old female with neck and back pain. History of kyphoplasty, most recently the T5 level on 06/19/2019. Right side T5 and T6 transverse process fractures on MRI 07/22/2019. EXAM: MRI CERVICAL SPINE WITHOUT CONTRAST TECHNIQUE: Multiplanar, multisequence MR imaging of the cervical spine was performed. No intravenous contrast was administered. COMPARISON:  Cervical spine MRI 05/12/2016. FINDINGS: Alignment: Unchanged normal cervical lordosis. No spondylolisthesis. Vertebrae: No marrow edema or acute osseous abnormality in the cervical spine. Abnormal T4 level, reported separately with the thoracic MRI today. Cord: Normal.  Capacious spinal  canal. Posterior Fossa, vertebral arteries, paraspinal tissues: Cervicomedullary junction is within normal limits. Negative visible posterior fossa. Preserved major vascular flow voids in the neck. Negative visible neck soft tissues and lung apices. Disc levels: Very mild for age cervical spine degeneration appears unchanged since 2018, with no spinal stenosis. Chronic mild broad-based disc protrusion at C5-C6. IMPRESSION: 1. Stable MRI appearance of the cervical spine since 2018, with no acute or significant findings. 2. Abnormal T4 vertebra, reported separately with the Thoracic MRI today. Electronically Signed   By: Genevie Ann M.D.   On: 09/16/2019 09:59   MR THORACIC SPINE WO CONTRAST  Result Date: 09/16/2019 CLINICAL DATA:  74 year old female with neck and back pain. History of kyphoplasty, most recently the T5 level on 06/19/2019. Right side T5 and T6 transverse  process fractures on MRI 07/22/2019. EXAM: MRI THORACIC SPINE WITHOUT CONTRAST TECHNIQUE: Multiplanar, multisequence MR imaging of the thoracic spine was performed. No intravenous contrast was administered. COMPARISON:  Cervical spine MRI today. Thoracic spine MRI 07/22/2019. FINDINGS: Limited cervical spine imaging:  Reported separately today. Thoracic spine segmentation: Normal, the same numbering system used on MRIs earlier this year. Alignment: Stable exaggerated upper thoracic kyphosis, and mild straightening of lower thoracic kyphosis. No spondylolisthesis. Vertebrae: Previously augmented T5, T11, T12, and also upper lumbar compression fractures. Mild loss of T5 vertebral body height when compared to 06/16/2019. Heterogeneous STIR signal again noted in the T4 body as seen on the cervical spine MRI today, however, this is chronic and related to a benign vertebral hemangioma (note lack of decreased T1 signal in the T4 vertebra on the cervical MRI today) which as on prior studies simulates marrow edema on STIR imaging. However, the T1 through T4  levels appears stable since April. There is mild chronic compression of T2 and T4. However, there is new STIR hyperintensity along with mild loss of vertebral body height at the T8 vertebra. See series 21, image 8, and there has been 10% loss of vertebral body height there compared to April. No retropulsion. T8 posterior elements appear to remain intact. Otherwise the T6 through T10 levels are also stable since April, with chronic compression of T6, T9 and T10. No other acute osseous abnormality. Right side T5 and T6 transverse process edema has resolved. Grossly intact posterior ribs. Cord: Stable. The cord is persistently effaced at the T5 level due to retropulsion. But the spinal canal remains capacious above and below that level. Paraspinal and other soft tissues: Regressed small left pleural effusion. Trace bilateral pleural fluid today. Otherwise negative. Disc levels: Unchanged.  No thoracic spinal stenosis outside of the T5 level. IMPRESSION: 1. Acute to subacute mild T8 compression fracture, new since a May MRI. 10% loss of vertebral body height at that level with no retropulsion or complicating features. 2. 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 3. Stable T5 compression fracture augmentation with retropulsion resulting in mild spinal stenosis at that level. 4. 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 Electronically Signed   By: Genevie Ann M.D.   On: 09/16/2019 10:11   MR LUMBAR SPINE WO CONTRAST  Result Date: 09/16/2019 CLINICAL DATA:  74 year old female with neck and back pain. History of kyphoplasty, most recently the T5 level on 06/19/2019. Right side T5 and T6 transverse process fractures on MRI 07/22/2019. EXAM: MRI LUMBAR SPINE WITHOUT CONTRAST TECHNIQUE: Multiplanar, multisequence MR imaging of the  lumbar spine was performed. No intravenous contrast was administered. COMPARISON:  Thoracic spine MRI today reported separately. Lumbar MRI 09/27/2017. FINDINGS: Segmentation:  Normal, concordant with the thoracic numbering today. Alignment: Stable lumbar lordosis since 2019. No significant spondylolisthesis. Vertebrae: Previously augmented compression fractures of T11 through L2 and L4. The L3 and L5 vertebrae remain intact. No marrow edema or evidence of acute osseous abnormality. Intact visible sacrum and SI joints. Conus medullaris and cauda equina: Conus extends to the T12-L1 level. No lower spinal cord or conus signal abnormality. Paraspinal and other soft tissues: Negative. Disc levels: Mild for age lumbar spine degeneration. There is borderline to mild lumbar spinal stenosis at L3-L4 and L4-L5 in part related to disc bulging and mild L4 retropulsion. IMPRESSION: 1.  No acute findings in the lumbar spine. 2. Previously augmented lumbar compression fractures except at L3 and L5. 3. 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 Electronically Signed   By: Genevie Ann M.D.   On: 09/16/2019 10:14   DG Chest Portable 1 View  Result Date: 09/16/2019 CLINICAL DATA:  Shortness of breath EXAM: PORTABLE CHEST 1 VIEW COMPARISON:  May 29, 2019 FINDINGS: The heart size and mediastinal contours are stable. Both lungs are clear. The visualized skeletal structures are stable. IMPRESSION: No active disease. Electronically Signed   By: Abelardo Diesel M.D.   On: 09/16/2019 08:14    ____________________________________________   PROCEDURES  Procedure(s) performed (including Critical Care):  Procedures   ____________________________________________   INITIAL IMPRESSION / ASSESSMENT AND PLAN / ED COURSE  CHISTINE DEMATTEO was evaluated in Emergency Department on 09/16/2019 for the symptoms described in the history of present illness. She was evaluated in the context of the global COVID-19 pandemic,  which necessitated consideration that the patient might be at risk for infection with the SARS-CoV-2 virus that causes COVID-19. Institutional protocols and algorithms that pertain to the evaluation of patients at risk for COVID-19 are in a state of rapid change based on information released by regulatory bodies including the CDC and federal and state organizations. These policies and algorithms were followed during the patient's care in the ED.    Patient is a 74 year old who comes in with significant back pain.  Patient appears to be in significant discomfort.  Patient's pain is mostly over the thoracic region but seems to radiate up and down the spine.  Patient has had some concern for spinal edema on prior MRIs.  I think at this point although there is no signs of cord compression that the best course of management will be get to get a repeat MRI due to acute change in her symptoms to make sure no large compression fracture, make sure no signs of cord edema and to make sure that it would be safe to keep patient here.  Suspect the patient will need admission for pain control given her prior admissions secondary to this.  Seems to be less likely related to her heart but will get cardiac markers and EKG.  Will get chest x-ray to make sure her lungs are clear.  Last time I saw her back in April she was also having the pain with breathing and CT PE was negative.  It seems that the pain is more just referred from her back.  Her abdomen is soft and nontender I have low suspicion for acute abdominal pathology  Labs are reassuring.  Hemoglobin is stable around baseline at 10.7.  White count slightly elevated 12.4 but patient could have runs a little elevated.  Cardiac markers negative x2.  Potassium slightly low we will give some oral repletion  Chest x-ray negative  MRI shows acute to subacute mild T8 compression fracture with 10% vertebral body height, stable T5 compression fracture with some retropulsion and  some chronic thoracic compression fractures elsewhere.  I discussed the results with patient and I think that this is the cause of her pain.  I have low suspicion for PE.  She is no risk factors for PE and she states that she is pretty mobile.  She has had this similar pain before and had a CT PE that was negative at this time.  At this time patient would like to hold off on CT scan given low suspicion for PE.  Her abdomen is remained soft and nontender  low suspicion for acute abdominal pathology  On repeat evaluation patient's pain is much better.  Patient is able to ambulate around the room without any issues.  We discussed with an outpatient need to be admitted for pain control versus going home on pain medication.  Patient states that she would prefer to try to go home on pain medications.  Her biggest concern is that she does have some nausea with the pain meds.  Prescribe her some Zofrand  to take this 30 minutes prior to the pain medication and that she can split the pain medicine in half as well.  I discussed with Dr. Rudene Christians partner and he is out of town until Tuesday.  I discussed with the family that they should call the office on Tuesday to get a follow-up appointment to potentially be scheduled for kyphoplasty.  Patient expressed understanding.  Yolanda Bonine is also at bedside they felt comfortable with this plan.  I discussed the provisional nature of ED diagnosis, the treatment so far, the ongoing plan of care, follow up appointments and return precautions with the patient and any family or support people present. They expressed understanding and agreed with the plan, discharged home.          ____________________________________________   FINAL CLINICAL IMPRESSION(S) / ED DIAGNOSES   Final diagnoses:  Compression fracture of body of thoracic vertebra (HCC)      MEDICATIONS GIVEN DURING THIS VISIT:  Medications  lidocaine (LIDODERM) 5 % 1 patch (1 patch Transdermal Refused  09/16/19 0826)  potassium chloride SA (KLOR-CON) CR tablet 40 mEq (has no administration in time range)  HYDROmorphone (DILAUDID) injection 0.5 mg (0.5 mg Intravenous Given 09/16/19 0739)  ondansetron (ZOFRAN) injection 4 mg (4 mg Intravenous Given 09/16/19 0738)  acetaminophen (TYLENOL) tablet 1,000 mg (1,000 mg Oral Given 09/16/19 0739)  LORazepam (ATIVAN) injection 1 mg (1 mg Intravenous Given 09/16/19 0857)  ondansetron (ZOFRAN) injection 4 mg (4 mg Intravenous Given 09/16/19 0856)     ED Discharge Orders         Ordered    ondansetron (ZOFRAN ODT) 4 MG disintegrating tablet  Every 8 hours PRN     Discontinue  Reprint     09/16/19 1153    HYDROcodone-acetaminophen (NORCO/VICODIN) 5-325 MG tablet  Every 6 hours PRN     Discontinue  Reprint     09/16/19 1153           Note:  This document was prepared using Dragon voice recognition software and may include unintentional dictation errors.   Vanessa Victoria, MD 09/16/19 1158

## 2019-09-16 NOTE — ED Notes (Signed)
Pt stating she is still nausea and will also need something for claustrophobia before the MRI. Md made aware. No new orders at this time.

## 2019-09-20 DIAGNOSIS — R030 Elevated blood-pressure reading, without diagnosis of hypertension: Secondary | ICD-10-CM | POA: Insufficient documentation

## 2019-09-20 DIAGNOSIS — S22060A Wedge compression fracture of T7-T8 vertebra, initial encounter for closed fracture: Secondary | ICD-10-CM | POA: Insufficient documentation

## 2019-10-04 ENCOUNTER — Ambulatory Visit
Admission: EM | Admit: 2019-10-04 | Discharge: 2019-10-04 | Disposition: A | Discharge status: Home / Self Care | Payer: Medicare Other | Attending: Family Medicine | Admitting: Family Medicine

## 2019-10-04 ENCOUNTER — Encounter: Payer: Self-pay | Admitting: Emergency Medicine

## 2019-10-04 ENCOUNTER — Other Ambulatory Visit: Payer: Self-pay

## 2019-10-04 DIAGNOSIS — J441 Chronic obstructive pulmonary disease with (acute) exacerbation: Secondary | ICD-10-CM

## 2019-10-04 MED ORDER — ONDANSETRON 4 MG PO TBDP: 4.0000 mg | ORAL_TABLET | Freq: Three times a day (TID) | ORAL | 0 refills | Status: DC | PRN

## 2019-10-04 MED ORDER — DOXYCYCLINE HYCLATE 100 MG PO CAPS: 100.0000 mg | ORAL_CAPSULE | Freq: Two times a day (BID) | ORAL | 0 refills | Status: DC

## 2019-10-04 MED ORDER — PREDNISONE 10 MG PO TABS: ORAL_TABLET | ORAL | 0 refills | Status: DC

## 2019-10-04 NOTE — ED Triage Notes (Signed)
Pt c/o cough, nasal congestion, and weakness. Started about 3 days ago. Denies fever or increased shortness of breath. She states she had covid back in February.

## 2019-10-04 NOTE — Discharge Instructions (Signed)
Medication as prescribed. ° °If you worsen, go to the ER. ° °Take care ° °Dr. Francee Setzer  °

## 2019-10-04 NOTE — ED Provider Notes (Signed)
[facility]    CSN: [id number] Arrival date & time: 10/04/19  1447  History   Chief Complaint Chief Complaint  Patient presents with   Cough   Sinus Problem   HPI  74 year old female with emphysema presents with respiratory symptoms.  Patient reports 3-day history of symptoms.  Reports cough which is productive.  Also reports sinus pain and pressure as well as congestion.  Associated weakness.  She has had some nausea today.  Feels poorly.  Endorses compliance with her home medications.  No recent sick contacts.  Has had COVID-19 previously in February.  Patient states that she is short of breath as well but this is chronic as she is always short of breath.  No relieving factors.   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)    Iron (Fe) deficiency anemia    Migraine headache    in past   Migraines    Motion sickness    No natural teeth    Osteoporosis    Rheumatoid arthritis ([place])    Vertigo     Patient Active Problem List   Diagnosis Date Noted   GERD (gastroesophageal reflux disease) 06/18/2019   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[place]) 06/18/2019   H/O kyphoplasty 06/18/2019   Compression fracture of body of thoracic vertebra ([place]) 06/16/2019   Flank pain    Intractable back pain 08/08/2017   Compression fracture of lumbar vertebra ([place]) 08/08/2017   Abdominal pain, RUQ 08/06/2017   Fatigue 05/29/2017   Exertional dyspnea 05/29/2017   Guaiac positive stools 11/26/2016   Iron deficiency anemia 09/01/2016   B12 deficiency 08/31/2016   Normocytic anemia 08/30/2016    Centrilobular emphysema ([place]) 05/09/2015   HTN (hypertension), benign 12/10/2013    Past Surgical History:  Procedure Laterality Date   CATARACT EXTRACTION W/PHACO Right 08/12/2016   Procedure: CATARACT EXTRACTION PHACO AND INTRAOCULAR LENS PLACEMENT ([place])  Right;  Surgeon: Leandrew Koyanagi, MD;  Location: [place];  Service: Ophthalmology;  Laterality: Right;   CATARACT EXTRACTION W/PHACO Left 09/30/2016   Procedure: CATARACT EXTRACTION PHACO AND INTRAOCULAR LENS PLACEMENT ([place]) Left;  Surgeon: Leandrew Koyanagi, MD;  Location: [place];  Service: Ophthalmology;  Laterality: Left;   COLONOSCOPY     COLONOSCOPY WITH PROPOFOL N/A 12/09/2016   Procedure: COLONOSCOPY WITH PROPOFOL;  Surgeon: Toledo, Benay Pike, MD;  Location: [facility];  Service: Endoscopy;  Laterality: N/A;   ESOPHAGOGASTRODUODENOSCOPY     ESOPHAGOGASTRODUODENOSCOPY (EGD) WITH PROPOFOL N/A 12/09/2016   Procedure: ESOPHAGOGASTRODUODENOSCOPY (EGD) WITH PROPOFOL;  Surgeon: Toledo, Benay Pike, MD;  Location: [facility];  Service: Endoscopy;  Laterality: N/A;   KYPHOPLASTY N/A 08/10/2017   Procedure: Hewitt Shorts;  Surgeon: Hessie Knows, MD;  Location: [facility];  Service: Orthopedics;  Laterality: N/A;   KYPHOPLASTY N/A 09/09/2017   Procedure: [id number]-G9,F6;  Surgeon: Hessie Knows, MD;  Location: [facility];  Service: Orthopedics;  Laterality: N/A;   KYPHOPLASTY N/A 09/28/2017   Procedure: [id number];  Surgeon: Hessie Knows, MD;  Location: [facility];  Service: Orthopedics;  Laterality: N/A;   KYPHOPLASTY N/A 06/19/2019  Procedure: KYPHOPLASTY T5;  Surgeon: Hessie Knows, MD;  Location: [facility];  Service: Orthopedics;  Laterality: N/A;   OOPHORECTOMY Bilateral 2003    OB History    Gravida  1   Para      Term      Preterm      AB  1   Living        SAB      TAB      Ectopic      Multiple      Live Births               Home Medications    Prior to  Admission medications   Medication Sig Start Date End Date Taking? Authorizing Provider  albuterol (VENTOLIN HFA) 108 (90 Base) MCG/ACT inhaler Inhale 2 puffs into the lungs every 6 (six) hours as needed for wheezing or shortness of breath.   Yes [provider]  Cholecalciferol (VITAMIN D3) 2000 UNITS capsule Take 2,000 Units by mouth daily.    Yes [provider]  cyanocobalamin (,VITAMIN B-12,) 1000 MCG/ML injection INJECT 1ML INTO THE MUSCLE EVERY 30 DAYS 07/19/19  Yes Corcoran, Melissa C, MD  DEXILANT 60 MG capsule Take 60 mg by mouth daily.  08/04/14  Yes [provider]  Fe Fum-FePoly-Vit C-Vit B3 (INTEGRA) 62.5-62.5-40-3 MG CAPS Take 1 capsule by mouth daily. 11/29/13  Yes [provider]  predniSONE (DELTASONE) 5 MG tablet Take 5 mg by mouth daily.    Yes [provider]  Saline (SIMPLY SALINE) 0.9 % AERS Place into the nose as needed.   Yes [provider]  umeclidinium-vilanterol (ANORO ELLIPTA) 62.5-25 MCG/INH AEPB Inhale 1 puff into the lungs daily.   Yes [provider]  doxycycline (VIBRAMYCIN) 100 MG capsule Take 1 capsule (100 mg total) by mouth 2 (two) times daily. 10/04/19   Coral Spikes, DO  ondansetron (ZOFRAN-ODT) 4 MG disintegrating tablet Take 1 tablet (4 mg total) by mouth every 8 (eight) hours as needed for nausea or vomiting. 10/04/19   Coral Spikes, DO  predniSONE (DELTASONE) 10 MG tablet 50 mg daily x 2 days, then 40 mg daily x 2 days, then 30 mg daily x 2 days, then 20 mg daily x 2 days, then 10 mg daily x 2 days. 10/04/19   Coral Spikes, DO    Family History Family History  Problem Relation Age of Onset   Kidney disease Mother    Diabetes Mellitus II Brother     Social History Social History   Tobacco Use   Smoking status: Former Smoker    Packs/day: 1.00    Types: Cigarettes    Quit date: 2009    Years since quitting: 12.5   Smokeless tobacco: Never Used  Scientific laboratory technician Use: Never  used  Substance Use Topics   Alcohol use: No   Drug use: No     Allergies   Methotrexate derivatives, Other, Ranitidine, Sulfasalazine, and Topiramate   Review of Systems Review of Systems  HENT: Positive for congestion, sinus pressure and sinus pain.   Respiratory: Positive for cough and shortness of breath.   Gastrointestinal: Positive for nausea.  Neurological: Positive for weakness.   Physical Exam Triage Vital Signs ED Triage Vitals  Enc Vitals Group     BP 10/04/19 1508 (!) 149/95     Pulse Rate 10/04/19 1508 99     Resp 10/04/19 1508 20     Temp 10/04/19 1508  98.6 F (37 C)     Temp Source 10/04/19 1508 Oral     SpO2 10/04/19 1508 96 %     Weight 10/04/19 1505 132 lb 15 oz (60.3 kg)     Height 10/04/19 1505 5\' 2"  (1.575 m)     Head Circumference --      Peak Flow --      Pain Score 10/04/19 1505 0     Pain Loc --      Pain Edu? --      Excl. in [place]? --     Updated Vital Signs BP (!) 149/95 (BP Location: Right Arm)    Pulse 99    Temp 98.6 F (37 C) (Oral)    Resp 20    Ht 5\' 2"  (1.575 m)    Wt 60.3 kg    SpO2 96%    BMI 24.31 kg/m   Visual Acuity Right Eye Distance:   Left Eye Distance:   Bilateral Distance:    Right Eye Near:   Left Eye Near:    Bilateral Near:     Physical Exam Vitals and nursing note reviewed.  Constitutional:      Comments: Frail elderly female in no acute distress.   HENT:     Head: Normocephalic and atraumatic.     Nose:     Comments: Right maxillary sinus tenderness.  Eyes:     General:        Right eye: No discharge.        Left eye: No discharge.     Conjunctiva/sclera: Conjunctivae normal.  Cardiovascular:     Rate and Rhythm: Normal rate and regular rhythm.     Heart sounds: No murmur heard.   Pulmonary:     Effort: Pulmonary effort is normal. No respiratory distress.     Breath sounds: No wheezing or rales.  Psychiatric:        Mood and Affect: Mood normal.        Behavior: Behavior normal.    UC  Treatments / Results  Labs (all labs ordered are listed, but only abnormal results are displayed) Labs Reviewed - No data to display  EKG   Radiology No results found.  Procedures Procedures (including critical care time)  Medications Ordered in UC Medications - No data to display  Initial Impression / Assessment and Plan / UC Course  I have reviewed the triage vital signs and the nursing notes.  Pertinent labs & imaging results that were available during my care of the patient were reviewed by me and considered in my medical decision making (see chart for details).    74 year old female presents with COPD exacerbation. Treating with prednisone, doxy. PRN Zofran.   Final Clinical Impressions(s) / UC Diagnoses   Final diagnoses:  COPD exacerbation [facility])     Discharge Instructions     Medication as prescribed.  If you worsen, go to the ER.  Take care  Dr. Lacinda Axon    ED Prescriptions    Medication Sig Dispense Auth. Provider   predniSONE (DELTASONE) 10 MG tablet 50 mg daily x 2 days, then 40 mg daily x 2 days, then 30 mg daily x 2 days, then 20 mg daily x 2 days, then 10 mg daily x 2 days. 30 tablet Alvis Edgell G, DO   doxycycline (VIBRAMYCIN) 100 MG capsule Take 1 capsule (100 mg total) by mouth 2 (two) times daily. 14 capsule Mylinh Cragg G, DO   ondansetron (ZOFRAN-ODT) 4  MG disintegrating tablet Take 1 tablet (4 mg total) by mouth every 8 (eight) hours as needed for nausea or vomiting. 20 tablet Coral Spikes, DO     PDMP not reviewed this encounter.   Coral Spikes, [place] 10/04/19 2009

## 2019-10-12 ENCOUNTER — Other Ambulatory Visit: Payer: Self-pay | Admitting: Internal Medicine

## 2019-10-12 ENCOUNTER — Other Ambulatory Visit
Admission: RE | Admit: 2019-10-12 | Discharge: 2019-10-12 | Disposition: A | Discharge status: Home / Self Care | Payer: Medicare Other | Source: Ambulatory Visit | Attending: Student | Admitting: Student

## 2019-10-12 DIAGNOSIS — Z79899 Other long term (current) drug therapy: Secondary | ICD-10-CM | POA: Insufficient documentation

## 2019-10-12 DIAGNOSIS — R0602 Shortness of breath: Secondary | ICD-10-CM | POA: Diagnosis not present

## 2019-10-12 DIAGNOSIS — I10 Essential (primary) hypertension: Secondary | ICD-10-CM | POA: Diagnosis not present

## 2019-10-12 DIAGNOSIS — I208 Other forms of angina pectoris: Secondary | ICD-10-CM | POA: Diagnosis not present

## 2019-10-12 DIAGNOSIS — Z01818 Encounter for other preprocedural examination: Secondary | ICD-10-CM | POA: Insufficient documentation

## 2019-10-12 LAB — BRAIN NATRIURETIC PEPTIDE: B Natriuretic Peptide: 62.9 pg/mL (ref 0.0–100.0)

## 2019-10-16 ENCOUNTER — Other Ambulatory Visit: Payer: Self-pay

## 2019-10-16 ENCOUNTER — Other Ambulatory Visit
Admission: RE | Admit: 2019-10-16 | Discharge: 2019-10-16 | Disposition: A | Discharge status: Home / Self Care | Payer: Medicare Other | Source: Ambulatory Visit | Attending: Internal Medicine | Admitting: Internal Medicine

## 2019-10-16 DIAGNOSIS — Z01812 Encounter for preprocedural laboratory examination: Secondary | ICD-10-CM | POA: Insufficient documentation

## 2019-10-16 DIAGNOSIS — Z20822 Contact with and (suspected) exposure to covid-19: Secondary | ICD-10-CM | POA: Insufficient documentation

## 2019-10-16 LAB — SARS CORONAVIRUS 2 (TAT 6-24 HRS): SARS Coronavirus 2: NEGATIVE

## 2019-10-17 ENCOUNTER — Other Ambulatory Visit: Payer: Medicare Other

## 2019-10-19 ENCOUNTER — Encounter
Admission: RE | Disposition: A | Discharge status: Home / Self Care | Payer: Self-pay | Source: Ambulatory Visit | Attending: Internal Medicine

## 2019-10-19 ENCOUNTER — Ambulatory Visit
Admission: RE | Admit: 2019-10-19 | Discharge: 2019-10-19 | Disposition: A | Discharge status: Home / Self Care | Payer: Medicare Other | Source: Ambulatory Visit | Attending: Internal Medicine | Admitting: Internal Medicine

## 2019-10-19 DIAGNOSIS — K219 Gastro-esophageal reflux disease without esophagitis: Secondary | ICD-10-CM | POA: Insufficient documentation

## 2019-10-19 DIAGNOSIS — R9439 Abnormal result of other cardiovascular function study: Secondary | ICD-10-CM

## 2019-10-19 DIAGNOSIS — R0609 Other forms of dyspnea: Secondary | ICD-10-CM | POA: Insufficient documentation

## 2019-10-19 DIAGNOSIS — Z8616 Personal history of COVID-19: Secondary | ICD-10-CM | POA: Diagnosis not present

## 2019-10-19 DIAGNOSIS — Z79899 Other long term (current) drug therapy: Secondary | ICD-10-CM | POA: Diagnosis not present

## 2019-10-19 DIAGNOSIS — M069 Rheumatoid arthritis, unspecified: Secondary | ICD-10-CM | POA: Insufficient documentation

## 2019-10-19 DIAGNOSIS — J449 Chronic obstructive pulmonary disease, unspecified: Secondary | ICD-10-CM | POA: Diagnosis not present

## 2019-10-19 DIAGNOSIS — M810 Age-related osteoporosis without current pathological fracture: Secondary | ICD-10-CM | POA: Diagnosis not present

## 2019-10-19 DIAGNOSIS — I110 Hypertensive heart disease with heart failure: Secondary | ICD-10-CM | POA: Insufficient documentation

## 2019-10-19 DIAGNOSIS — I351 Nonrheumatic aortic (valve) insufficiency: Secondary | ICD-10-CM | POA: Insufficient documentation

## 2019-10-19 DIAGNOSIS — D509 Iron deficiency anemia, unspecified: Secondary | ICD-10-CM | POA: Diagnosis not present

## 2019-10-19 DIAGNOSIS — I209 Angina pectoris, unspecified: Secondary | ICD-10-CM | POA: Insufficient documentation

## 2019-10-19 DIAGNOSIS — I5032 Chronic diastolic (congestive) heart failure: Secondary | ICD-10-CM | POA: Insufficient documentation

## 2019-10-19 DIAGNOSIS — Z87891 Personal history of nicotine dependence: Secondary | ICD-10-CM | POA: Insufficient documentation

## 2019-10-19 HISTORY — PX: RIGHT/LEFT HEART CATH AND CORONARY ANGIOGRAPHY: CATH118266

## 2019-10-19 SURGERY — RIGHT/LEFT HEART CATH AND CORONARY ANGIOGRAPHY
Anesthesia: Moderate Sedation | Laterality: Bilateral

## 2019-10-19 MED ORDER — FENTANYL CITRATE (PF) 100 MCG/2ML IJ SOLN
INTRAMUSCULAR | Status: AC
  Filled 2019-10-19: qty 2

## 2019-10-19 MED ORDER — MIDAZOLAM HCL 2 MG/2ML IJ SOLN
INTRAMUSCULAR | Status: DC | PRN
  Administered 2019-10-19: 1 mg via INTRAVENOUS

## 2019-10-19 MED ORDER — SODIUM CHLORIDE 0.9 % IV SOLN: 250.0000 mL | INTRAVENOUS | Status: DC | PRN

## 2019-10-19 MED ORDER — SODIUM CHLORIDE 0.9% FLUSH: 3.0000 mL | Freq: Two times a day (BID) | INTRAVENOUS | Status: DC

## 2019-10-19 MED ORDER — SODIUM CHLORIDE 0.9 % WEIGHT BASED INFUSION: 1.0000 mL/kg/h | INTRAVENOUS | Status: DC

## 2019-10-19 MED ORDER — HEPARIN (PORCINE) IN NACL 1000-0.9 UT/500ML-% IV SOLN
INTRAVENOUS | Status: AC
  Filled 2019-10-19: qty 1000

## 2019-10-19 MED ORDER — MIDAZOLAM HCL 2 MG/2ML IJ SOLN
INTRAMUSCULAR | Status: AC
  Filled 2019-10-19: qty 2

## 2019-10-19 MED ORDER — SODIUM CHLORIDE 0.9 % WEIGHT BASED INFUSION: 3.0000 mL/kg/h | INTRAVENOUS | Status: DC

## 2019-10-19 MED ORDER — HEPARIN (PORCINE) IN NACL 1000-0.9 UT/500ML-% IV SOLN
INTRAVENOUS | Status: DC | PRN
  Administered 2019-10-19: 500 mL

## 2019-10-19 MED ORDER — HYDRALAZINE HCL 20 MG/ML IJ SOLN: 10.0000 mg | INTRAMUSCULAR | Status: DC | PRN

## 2019-10-19 MED ORDER — SODIUM CHLORIDE 0.9% FLUSH: 3.0000 mL | INTRAVENOUS | Status: DC | PRN

## 2019-10-19 MED ORDER — LABETALOL HCL 5 MG/ML IV SOLN: 10.0000 mg | INTRAVENOUS | Status: DC | PRN

## 2019-10-19 MED ORDER — ASPIRIN 81 MG PO CHEW: 81.0000 mg | CHEWABLE_TABLET | ORAL | Status: DC

## 2019-10-19 MED ORDER — LIDOCAINE HCL (PF) 1 % IJ SOLN
INTRAMUSCULAR | Status: DC | PRN
  Administered 2019-10-19: 30 mL

## 2019-10-19 MED ORDER — SODIUM CHLORIDE 0.9 % WEIGHT BASED INFUSION
1.0000 mL/kg/h | INTRAVENOUS | Status: DC
Start: 2019-10-20 — End: 2019-10-19

## 2019-10-19 MED ORDER — FENTANYL CITRATE (PF) 100 MCG/2ML IJ SOLN
INTRAMUSCULAR | Status: DC | PRN
  Administered 2019-10-19 (×2): 25 ug via INTRAVENOUS

## 2019-10-19 MED ORDER — ACETAMINOPHEN 325 MG PO TABS: 650.0000 mg | ORAL_TABLET | ORAL | Status: DC | PRN

## 2019-10-19 MED ORDER — ONDANSETRON HCL 4 MG/2ML IJ SOLN: 4.0000 mg | Freq: Four times a day (QID) | INTRAMUSCULAR | Status: DC | PRN

## 2019-10-19 MED ORDER — LIDOCAINE HCL (PF) 1 % IJ SOLN
INTRAMUSCULAR | Status: AC
  Filled 2019-10-19: qty 30

## 2019-10-19 MED ORDER — IOHEXOL 300 MG/ML  SOLN
INTRAMUSCULAR | Status: DC | PRN
  Administered 2019-10-19: 55 mL

## 2019-10-19 SURGICAL SUPPLY — 10 items
CATH INFINITI 5FR JL4 (CATHETERS) ×3 IMPLANT
CATH INFINITI JR4 5F (CATHETERS) ×3 IMPLANT
CATH SWANZ 7F THERMO (CATHETERS) ×3 IMPLANT
DEVICE CLOSURE MYNXGRIP 5F (Vascular Products) ×3 IMPLANT
KIT MANI 3VAL PERCEP (MISCELLANEOUS) ×3 IMPLANT
NEEDLE PERC 18GX7CM (NEEDLE) ×3 IMPLANT
PACK CARDIAC CATH (CUSTOM PROCEDURE TRAY) ×3 IMPLANT
SHEATH AVANTI 5FR X 11CM (SHEATH) ×3 IMPLANT
SHEATH AVANTI 7FRX11 (SHEATH) ×3 IMPLANT
WIRE GUIDERIGHT .035X150 (WIRE) ×3 IMPLANT

## 2019-10-23 ENCOUNTER — Encounter: Payer: Self-pay | Admitting: Internal Medicine

## 2019-10-26 ENCOUNTER — Ambulatory Visit
Payer: Medicare Other | Attending: Student in an Organized Health Care Education/Training Program | Admitting: Student in an Organized Health Care Education/Training Program

## 2019-10-26 ENCOUNTER — Encounter: Payer: Self-pay | Admitting: Student in an Organized Health Care Education/Training Program

## 2019-10-26 ENCOUNTER — Other Ambulatory Visit: Payer: Self-pay

## 2019-10-26 VITALS — BP 141/82 | HR 104 | Temp 97.2°F | Resp 18 | Ht 62.0 in | Wt 124.0 lb

## 2019-10-26 DIAGNOSIS — M47814 Spondylosis without myelopathy or radiculopathy, thoracic region: Secondary | ICD-10-CM | POA: Diagnosis present

## 2019-10-26 DIAGNOSIS — M40294 Other kyphosis, thoracic region: Secondary | ICD-10-CM | POA: Diagnosis present

## 2019-10-26 DIAGNOSIS — M47812 Spondylosis without myelopathy or radiculopathy, cervical region: Secondary | ICD-10-CM | POA: Insufficient documentation

## 2019-10-26 DIAGNOSIS — S22000A Wedge compression fracture of unspecified thoracic vertebra, initial encounter for closed fracture: Secondary | ICD-10-CM | POA: Insufficient documentation

## 2019-10-26 DIAGNOSIS — M5124 Other intervertebral disc displacement, thoracic region: Secondary | ICD-10-CM | POA: Diagnosis present

## 2019-10-26 DIAGNOSIS — G894 Chronic pain syndrome: Secondary | ICD-10-CM | POA: Diagnosis present

## 2019-10-26 NOTE — Progress Notes (Signed)
Patient: Ashley Mack  Service Category: E/M  Provider: Gillis Santa, MD  DOB: March 05, 1946  DOS: 10/26/2019  Referring Provider: Earnie Larsson, MD  MRN: [id number]  Setting: Ambulatory outpatient  PCP: Kirk Ruths, MD  Type: New Patient  Specialty: Interventional Pain Management    Location: Office  Delivery: Face-to-face     Primary Reason(s) for Visit: Encounter for initial evaluation of one or more chronic problems (new to examiner) potentially causing chronic pain, and posing a threat to normal musculoskeletal function. (Level of risk: High) CC: Back Pain (mid and upper)  HPI  Ashley Mack is a 74 y.o. year old, female patient, who comes for the first time to our practice referred by Earnie Larsson, MD [phone] [address] [place],  [place] [address], for our initial evaluation of her chronic pain. She has Normocytic anemia; B12 deficiency; Iron deficiency anemia; Guaiac positive stools; Fatigue; Exertional dyspnea; Abdominal pain, RUQ; Intractable back pain; Compression fracture of lumbar vertebra ([facility]); Flank pain; Compression fracture of body of thoracic vertebra ([place]); Centrilobular emphysema ([facility]); GERD (gastroesophageal reflux disease); HTN (hypertension), benign; Rheumatoid arthritis involving multiple sites [facility]); and H/O kyphoplasty on their problem list. Today she comes in for evaluation of her Back Pain (mid and upper)  Pain Assessment: Location: Mid, Upper Back  Radiating:  Radiates anteriorly around rib cage to front Onset: More than a month ago Duration: Chronic pain Quality: Constant (awful, horrible) Severity: 8 /10 (subjective, self-reported pain score)  Effect on ADL:  Limits ability to do basic ADLs Timing: Constant Modifying factors: rest and positioning BP: (!) 141/82   HR: (!) 104  Onset and Duration: Gradual Cause of pain: Surgery Severity: No change since onset, NAS-11 at its worse: 8/10, NAS-11 at its best: 6/10, NAS-11 now: 8/10 and NAS-11 on the  average: 8/10 Timing: After activity or exercise Aggravating Factors: Prolonged standing and Walking Alleviating Factors: Lying down and Chiropractic manipulations Associated Problems: Fatigue, Nausea and Weakness Quality of Pain: Aching and Feeling of weight Previous Examinations or Tests: Bone scan, CT scan and MRI scan Previous Treatments: Chiropractic manipulations  74 year old female who presents with chronic thoracic pain secondary to multiple compression fractures in thoracic spine status post multiple kyphoplasty's. She has had the symptoms for many years. MRI dated 09/16/2019 shows new T8 compression fracture that was not there in May 2021 with associated 10% loss of vertebral body height, stable T5 compression fracture status post kyphoplasty, T1, T3, T7 chronic compression fractures.  She describes pain that radiates around her rib cage to her anterior region that is worse with bending.  Most of her symptoms are worsened with bending and lifting. She does have some relief with sitting in a recliner. She is unable to do physical therapy and she would needed driver due to her impaired vision. Her husband has passed away. She does not tolerate medicine well: Mobic (vomiting), Robaxin (sedation), tizanidine (sedation), Darvocet (nausea), tramadol (vomiting), Vicodin (vomiting). She has a history of GERD and NSAIDs are avoided. She receives chiropractic manipulation by Dr. Grant Fontana with good results.  She is not interested in utilizing any medications that could increase her risk of sedation, dizziness. She remains at high risk of further compression fractures. She continues to live alone.  She has been seen by Dr. Trenton Gammon, neurosurgery who did not recommend repeat kyphoplasty for her new compression fracture.  Pertinent surgical and procedural history as below  Past Surgery: T5 kyphoplasty 06/19/2019 T11 Kyphoplasty 09/28/17.  L1 & L4 Kyphoplasty 09/09/17.  L2 Kyphoplasty 08/10/17  06/02/2019:  chronic compression fractures at T4, T5, T6 and T9. 10/13/2019: New T8 compression fracture along with chronic T4, T5, T6, T9  Injections: 06/14/2019: Bilateral T10-11 transforaminal ESI (mild relief of radiating pain) 06/16/2018: Right C5-6 TF ESI (good relief)  05/16/2018: Right C5-6 TF ESI (good relief) 04/27/2018: Right trapezius ridge trigger point injection (moderate relief) 01/25/2018: Left trapezius ridge and right T7, T10 and L1 paraspinal musculature trigger point injections. 01/02/2018: Right L3-4 transforaminal ESI 11/01/2017: Bilateral L3-4 transforaminal ESI (good relief) 10/04/2017: Bilateral S1 transforaminal ESI (moderate to good relief of lumbosacral pain, residual thoracolumbar pain) 07/20/2017: Right C5-6 transforaminal ESI 05/24/2017: Right C5-6 transforaminal ESI 04/05/2017: Right C5-6 transforaminal ESI (good relief times 3 weeks) 03/02/2017: Right C6 trigger point injection 11/24/2016: Right C6 trigger point injection (good relief 6 weeks) 08/18/2016: Bilateral levator scapula and Right T5 and T7 trigger point injection 05/18/2016: Right T5, left T6, bilateral T8 trigger point injections (good relief for at least 6 weeks) 03/04/2016: Left trochanteric bursa injection (good relief)  02/19/2016: Bilateral T8 trigger point injections (no relief) 04/19/2014: Right levator scapula, T4, T6, and T9 trigger-point injections (good relief) 02/27/2014: Right levator scapula, T6, and T7 trigger-point injections (good relief) 01/18/2014: Bilateral T7 and T8-8 trigger-point injections (75% relief)    Meds   Current Outpatient Medications:    albuterol (VENTOLIN HFA) 108 (90 Base) MCG/ACT inhaler, Inhale 2 puffs into the lungs every 6 (six) hours as needed for wheezing or shortness of breath., Disp: , Rfl:    Cholecalciferol (VITAMIN D3) 2000 UNITS capsule, Take 2,000 Units by mouth daily. , Disp: , Rfl:    DEXILANT 60 MG capsule, Take 60 mg by mouth daily. , Disp: , Rfl:     Fe Fum-FePoly-Vit C-Vit B3 (INTEGRA) 62.5-62.5-40-3 MG CAPS, Take 1 capsule by mouth daily., Disp: , Rfl:    ondansetron (ZOFRAN-ODT) 4 MG disintegrating tablet, Take 1 tablet (4 mg total) by mouth every 8 (eight) hours as needed for nausea or vomiting., Disp: 20 tablet, Rfl: 0   predniSONE (DELTASONE) 5 MG tablet, Take 5 mg by mouth daily. , Disp: , Rfl:    Saline (SIMPLY SALINE) 0.9 % AERS, Place 1 spray into the nose as needed (congestion). , Disp: , Rfl:   Imaging Review  Cervical Imaging:   Narrative CLINICAL DATA:  74 year old female with neck and back pain. History of kyphoplasty, most recently the T5 level on 06/19/2019. Right side T5 and T6 transverse process fractures on MRI 07/22/2019.  EXAM: MRI CERVICAL SPINE WITHOUT CONTRAST  TECHNIQUE: Multiplanar, multisequence MR imaging of the cervical spine was performed. No intravenous contrast was administered.  COMPARISON:  Cervical spine MRI 05/12/2016.  FINDINGS: Alignment: Unchanged normal cervical lordosis. No spondylolisthesis.  Vertebrae: No marrow edema or acute osseous abnormality in the cervical spine. Abnormal T4 level, reported separately with the thoracic MRI today.  Cord: Normal.  Capacious spinal canal.  Posterior Fossa, vertebral arteries, paraspinal tissues: Cervicomedullary junction is within normal limits. Negative visible posterior fossa. Preserved major vascular flow voids in the neck. Negative visible neck soft tissues and lung apices.  Disc levels:  Very mild for age cervical spine degeneration appears unchanged since 2018, with no spinal stenosis. Chronic mild broad-based disc protrusion at C5-C6.  IMPRESSION: 1. Stable MRI appearance of the cervical spine since 2018, with no acute or significant findings. 2. Abnormal T4 vertebra, reported separately with the Thoracic MRI today.   Electronically Signed By: Genevie Ann M.D. On: 09/16/2019 09:59  Narrative CLINICAL DATA:  Right  shoulder pain after fall  EXAM: RIGHT SHOULDER - 2+ VIEW  COMPARISON:  None.  FINDINGS: There is no evidence of fracture or dislocation. There is no evidence of arthropathy or other focal bone abnormality. Soft tissues are unremarkable.  IMPRESSION: Normal right shoulder.   Electronically Signed By: Marijo Conception, M.D. On: 06/12/2017 21:01    Narrative CLINICAL DATA:  74 year old female with neck and back pain. History of kyphoplasty, most recently the T5 level on 06/19/2019. Right side T5 and T6 transverse process fractures on MRI 07/22/2019.  EXAM: MRI THORACIC SPINE WITHOUT CONTRAST  TECHNIQUE: Multiplanar, multisequence MR imaging of the thoracic spine was performed. No intravenous contrast was administered.  COMPARISON:  Cervical spine MRI today. Thoracic spine MRI 07/22/2019.  FINDINGS: Limited cervical spine imaging:  Reported separately today.  Thoracic spine segmentation: Normal, the same numbering system used on MRIs earlier this year.  Alignment: Stable exaggerated upper thoracic kyphosis, and mild straightening of lower thoracic kyphosis. No spondylolisthesis.  Vertebrae: Previously augmented T5, T11, T12, and also upper lumbar compression fractures. Mild loss of T5 vertebral body height when compared to 06/16/2019.  Heterogeneous STIR signal again noted in the T4 body as seen on the cervical spine MRI today, however, this is chronic and related to a benign vertebral hemangioma (note lack of decreased T1 signal in the T4 vertebra on the cervical MRI today) which as on prior studies simulates marrow edema on STIR imaging. However, the T1 through T4 levels appears stable since April. There is mild chronic compression of T2 and T4.  However, there is new STIR hyperintensity along with mild loss of vertebral body height at the T8 vertebra. See series 21, image 8, and there has been 10% loss of vertebral body height there compared to April. No  retropulsion. T8 posterior elements appear to remain intact.  Otherwise the T6 through T10 levels are also stable since April, with chronic compression of T6, T9 and T10. No other acute osseous abnormality. Right side T5 and T6 transverse process edema has resolved. Grossly intact posterior ribs.  Cord: Stable. The cord is persistently effaced at the T5 level due to retropulsion. But the spinal canal remains capacious above and below that level.  Paraspinal and other soft tissues: Regressed small left pleural effusion. Trace bilateral pleural fluid today. Otherwise negative.  Disc levels:  Unchanged.  No thoracic spinal stenosis outside of the T5 level.  IMPRESSION: 1. Acute to subacute mild T8 compression fracture, new since a May MRI. 10% loss of vertebral body height at that level with no retropulsion or complicating features.  2. 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  3. Stable T5 compression fracture augmentation with retropulsion resulting in mild spinal stenosis at that level.  4. 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   Electronically Signed By: Genevie Ann M.D. On: 09/16/2019 10:11  CT Thoracic Spine Wo Contrast  Narrative CLINICAL DATA:  Upper back pain. History of multiple vertebral augmentations.  EXAM: CT THORACIC SPINE WITHOUT CONTRAST  TECHNIQUE: Multidetector CT images of the thoracic were obtained using the standard protocol without intravenous contrast.  COMPARISON:  None.  FINDINGS: Alignment: Normal.  Vertebrae: Status post vertebral augmentation at T11, T12 and L1. There is chronic height loss that is unchanged at T4, T6 and T9.  There is no new compression fracture. No progressive height loss at the augmented levels.  Paraspinal and  other soft tissues: There is calcific aortic atherosclerosis.  Disc levels: There is no spinal canal stenosis. No visible disc herniation.  IMPRESSION: 1. No acute fracture or static subluxation of the thoracic spine. 2. Unchanged chronic height loss at T4, T6 and T9. 3. Status post vertebral augmentation at T11, T12 and L1, without progression of height loss. 4. Aortic Atherosclerosis (ICD10-I70.0).   Electronically Signed By: Ulyses Jarred M.D. On: 05/30/2019 00:39    Narrative CLINICAL DATA:  Fractures of the thoracic spine.  EXAM: THORACIC SPINE 2 VIEWS  COMPARISON:  MRI dated 06/16/2019  FINDINGS: C-arm images demonstrate performance of kyphoplasty at T5. Old compression fracture of T6.  IMPRESSION: Kyphoplasty performed at T5.   Electronically Signed By: Lorriane Shire M.D. On: 06/19/2019 12:51    Narrative CLINICAL DATA:  74 year old female with neck and back pain. History of kyphoplasty, most recently the T5 level on 06/19/2019. Right side T5 and T6 transverse process fractures on MRI 07/22/2019.  EXAM: MRI LUMBAR SPINE WITHOUT CONTRAST  TECHNIQUE: Multiplanar, multisequence MR imaging of the lumbar spine was performed. No intravenous contrast was administered.  COMPARISON:  Thoracic spine MRI today reported separately. Lumbar MRI 09/27/2017.  FINDINGS: Segmentation:  Normal, concordant with the thoracic numbering today.  Alignment: Stable lumbar lordosis since 2019. No significant spondylolisthesis.  Vertebrae: Previously augmented compression fractures of T11 through L2 and L4. The L3 and L5 vertebrae remain intact. No marrow edema or evidence of acute osseous abnormality. Intact visible sacrum and SI joints.  Conus medullaris and cauda equina: Conus extends to the T12-L1 level. No lower spinal cord or conus signal abnormality.  Paraspinal and other soft tissues: Negative.  Disc levels:  Mild for age lumbar spine  degeneration.  There is borderline to mild lumbar spinal stenosis at L3-L4 and L4-L5 in part related to disc bulging and mild L4 retropulsion.  IMPRESSION: 1. No acute findings in the lumbar spine. 2. Previously augmented lumbar compression fractures except at L3 and L5. 3. 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   Electronically Signed By: Genevie Ann M.D. On: 09/16/2019 10:14   Narrative CLINICAL DATA:  Status post fall, multiple compression fractures with 4 level vertebral plasty with persistent pain.  EXAM: CT THORACIC AND LUMBAR SPINE WITHOUT CONTRAST  TECHNIQUE: Multidetector CT imaging of the thoracic and lumbar spine was performed without contrast. Multiplanar CT image reconstructions were also generated.  COMPARISON:  MR thoracic spine 09/27/2017, MR lumbar spine 09/27/2017  FINDINGS: CT THORACIC SPINE FINDINGS  Alignment: Normal.  Vertebrae: Chronic T1 vertebral body compression fracture with minimal height loss. Chronic T4, T6 and T9 vertebral body compression fractures. T12 vertebral body compression fracture with progressive height loss compared with 09/27/2017 which may reflect a acute on chronic compression fracture. T11 vertebral body compression fracture with methylmethacrylate within the T11 vertebral body from prior augmentation. L1 and L2 vertebral body compression fractures with methylmethacrylate within the vertebral bodies from prior augmentation. No aggressive osseous lesion. No evidence of discitis or osteomyelitis. Severe osteopenia.  Paraspinal and other soft tissues: No acute paraspinal abnormality. Thoracic aortic atherosclerosis. Coronary artery atherosclerosis.  Disc levels: Disc spaces are relatively well maintained. Neural foramina are patent.  CT LUMBAR SPINE FINDINGS  Segmentation: 5 lumbar type vertebrae.  Alignment: Normal.  Vertebrae: L1 vertebral body compression fracture with methylmethacrylate  within the L1 vertebral body from prior augmentation. L2 vertebral body  compression fracture with approximately 60% height loss and methylmethacrylate within the vertebral body from prior augmentation. 4 mm retropulsion of the superior posterior margin of the L2 vertebral body flattening the ventral thecal sac. L4 vertebral body compression fracture with methylmethacrylate within the vertebral body from prior augmentation. L3 and L5 vertebral body heights are maintained. No aggressive osseous lesion. No discitis or osteomyelitis. Severe osteopenia.  Paraspinal and other soft tissues: No acute paraspinal abnormality. Abdominal aortic atherosclerosis.  Disc levels: Disc spaces are maintained. No foraminal stenosis. Mild broad-based disc bulges at L3-4, L4-5 and L5-S1.  IMPRESSION: CT THORACIC SPINE IMPRESSION  1. T12 vertebral body compression fracture with progressive height loss compared with 09/27/2017 which may reflect a acute on chronic compression fracture. 2. Chronic T1, T4, T6 and T9 vertebral body compression fractures.  CT LUMBAR SPINE IMPRESSION  1.  No acute osseous injury of the lumbar spine. 2. Prior vertebral body augmentation at L1, L2 and L4. 3.  Aortic Atherosclerosis (ICD10-I70.0).   Electronically Signed By: Elige Ko On: 11/23/2017 14:41  Narrative CLINICAL DATA:  Portable images from kyphoplasty at L1 and L4.  Fluoro time: 2 min 41 sec   Dose-50.1 mGy  EXAM: LUMBAR SPINE - 2-3 VIEW; DG C-ARM 61-120 MIN  COMPARISON:  08/10/2017  FINDINGS: The 2 submitted spot fluoro graphic images show kyphoplasty cement at 2 new lumbar levels, consistent with L1 and L4 as provided history. There stable kyphoplasty cement in the L2 vertebra.  IMPRESSION: Imaging provided for kyphoplasty at L1 and L4. No findings to suggest a procedure complication.   Electronically Signed By: Amie Portland M.D. On: 09/09/2017 14:47   Narrative CLINICAL DATA:  Low  back pain.  EXAM: LUMBAR SPINE - COMPLETE 4+ VIEW  COMPARISON:  Abdomen and pelvis CT dated 05/25/2014.  FINDINGS: There is osteopenia throughout the lumbar spine and lower thoracic spine which limits characterization of osseous detail.  There is a mild wedge compression deformity involving the superior endplate of the T11 vertebral body which appears to be a change compared to the sagittal reconstructions of a CT abdomen dated 05/25/2014 suggesting acute or subacute compression fracture. There is associated mild buckling deformity of the anterior cortex of the T11 superior endplate. There is approximately 10% compression of the anterior and central portions of the T11 vertebral body when compared to the adjacent vertebral bodies. Posterior portion of the T11 vertebral body is well maintained in height.  Overall alignment of the thoracolumbar spine remains normal. No vertebral body displacement/ retropulsion at any level. No displaced fracture fragment. No fracture or displacement within the lumbar spine. Posterior elements appear grossly intact and well aligned throughout. Upper sacrum appears grossly intact and well aligned. Paravertebral soft tissues are unremarkable.  IMPRESSION: 1. Mild wedge compression deformity involving the superior endplate of the T11 vertebral body which appears to be new compared to a CT abdomen dated 05/25/2014 indicating acute or subacute mild compression fracture. Approximately 10% compression of the anterior and midportions of the T11 vertebral body with associated mild buckling deformity of the anterior cortex of the T11 superior endplate. Posterior portion of the T11 vertebral body is well maintained in height and there is no associated vertebral body malalignment/retropulsion (no evidence of unstable fracture). 2. No other focal/acute findings. 3. Osteopenia.   Electronically Signed By: Bary Richard M.D. On: 10/10/2014 10:03            Narrative CLINICAL DATA:  Left knee pain after fall.  EXAM: LEFT KNEE - COMPLETE  4+ VIEW  COMPARISON:  None.  FINDINGS: No evidence of fracture, dislocation, or joint effusion. No evidence of arthropathy or other focal bone abnormality. Soft tissues are unremarkable.  IMPRESSION: Normal left knee.   Electronically Signed By: Lupita Raider, M.D. On: 06/12/2017 20:55  DG Ankle Complete Left  Narrative CLINICAL DATA:  Left ankle pain after fall.  EXAM: LEFT ANKLE COMPLETE - 3+ VIEW  COMPARISON:  Radiographs of June 01, 2009.  FINDINGS: There is no evidence of fracture, dislocation, or joint effusion. There is no evidence of arthropathy or other focal bone abnormality. Soft tissues are unremarkable.  IMPRESSION: Normal left ankle.   Electronically Signed By: Lupita Raider, M.D. On: 06/12/2017 20:53    CLINICAL DATA:  Right hand pain after fall.  EXAM: RIGHT HAND - COMPLETE 3+ VIEW  COMPARISON:  None.  FINDINGS: There is no evidence of fracture or dislocation. Subchondral cyst formation is seen in the scaphoid consistent with degenerative joint disease. Degenerative changes seen involving the second metacarpophalangeal joint. Soft tissues are unremarkable.  IMPRESSION: Degenerative changes as described above. No acute abnormality seen in the right hand.   Electronically Signed By: Lupita Raider, M.D. On: 06/12/2017 20:51    ROS  Cardiovascular: No reported cardiovascular signs or symptoms such as High blood pressure, coronary artery disease, abnormal heart rate or rhythm, heart attack, blood thinner therapy or heart weakness and/or failure Pulmonary or Respiratory: Shortness of breath Neurological: No reported neurological signs or symptoms such as seizures, abnormal skin sensations, urinary and/or fecal incontinence, being born with an abnormal open spine and/or a tethered spinal cord Psychological-Psychiatric: No reported  psychological or psychiatric signs or symptoms such as difficulty sleeping, anxiety, depression, delusions or hallucinations (schizophrenial), mood swings (bipolar disorders) or suicidal ideations or attempts Gastrointestinal: Heartburn due to stomach pushing into lungs (Hiatal hernia) and Reflux or heatburn Genitourinary: No reported renal or genitourinary signs or symptoms such as difficulty voiding or producing urine, peeing blood, non-functioning kidney, kidney stones, difficulty emptying the bladder, difficulty controlling the flow of urine, or chronic kidney disease Hematological: Brusing easily Endocrine: No reported endocrine signs or symptoms such as high or low blood sugar, rapid heart rate due to high thyroid levels, obesity or weight gain due to slow thyroid or thyroid disease Rheumatologic: Joint aches and or swelling due to excess weight (Osteoarthritis) and Rheumatoid arthritis Musculoskeletal: Negative for myasthenia gravis, muscular dystrophy, multiple sclerosis or malignant hyperthermia Work History: Retired  Allergies  Ms. Bisesi is allergic to methotrexate derivatives, other, ranitidine, sulfasalazine, and topiramate.  Laboratory Chemistry Profile   Renal Lab Results  Component Value Date   BUN 9 09/16/2019   CREATININE 0.85 09/16/2019   GFRAA >60 09/16/2019   GFRNONAA >60 09/16/2019   PROTEINUR NEGATIVE 08/06/2017     Electrolytes Lab Results  Component Value Date   NA 136 09/16/2019   K 3.1 (L) 09/16/2019   CL 96 (L) 09/16/2019   CALCIUM 8.6 (L) 09/16/2019   MG 2.4 06/19/2019     Hepatic Lab Results  Component Value Date   AST 23 09/16/2019   ALT 22 09/16/2019   ALBUMIN 3.7 09/16/2019   ALKPHOS 67 09/16/2019   LIPASE 25 06/16/2019     ID Lab Results  Component Value Date   SARSCOV2NAA NEGATIVE 10/16/2019     Bone No results found for: VD25OH, VD125OH2TOT, QZ3007MA2, QJ3354TG2, 25OHVITD1, 25OHVITD2, 25OHVITD3, TESTOFREE, TESTOSTERONE    Endocrine Lab Results  Component Value Date   GLUCOSE 111 (H)  09/16/2019   GLUCOSEU NEGATIVE 08/06/2017   TSH 4.603 (H) 12/01/2018   FREET4 0.90 12/01/2018     Neuropathy Lab Results  Component Value Date   VITAMINB12 506 09/07/2018   FOLATE 8.0 06/07/2019     CNS No results found for: COLORCSF, APPEARCSF, RBCCOUNTCSF, WBCCSF, POLYSCSF, LYMPHSCSF, EOSCSF, PROTEINCSF, GLUCCSF, JCVIRUS, CSFOLI, IGGCSF, LABACHR, ACETBL, LABACHR, ACETBL   Inflammation (CRP: Acute   ESR: Chronic) Lab Results  Component Value Date   ESRSEDRATE 45 (H) 08/31/2016     Rheumatology No results found for: RF, ANA, LABURIC, URICUR, LYMEIGGIGMAB, LYMEABIGMQN, HLAB27   Coagulation Lab Results  Component Value Date   PLT 440 (H) 09/16/2019     Cardiovascular Lab Results  Component Value Date   BNP 62.9 10/12/2019   TROPONINI <0.03 05/06/2015   HGB 10.7 (L) 09/16/2019   HCT 33.4 (L) 09/16/2019     Screening Lab Results  Component Value Date   SARSCOV2NAA NEGATIVE 10/16/2019   COVIDSOURCE NASOPHARYNGEAL 04/28/2019     Cancer No results found for: CEA, CA125, LABCA2   Allergens No results found for: ALMOND, APPLE, ASPARAGUS, AVOCADO, BANANA, BARLEY, BASIL, BAYLEAF, GREENBEAN, LIMABEAN, WHITEBEAN, BEEFIGE, REDBEET, BLUEBERRY, BROCCOLI, CABBAGE, MELON, CARROT, CASEIN, CASHEWNUT, CAULIFLOWER, CELERY     Note: Lab results reviewed.  [place]  Drug: Ms. Zurn  reports no history of drug use. Alcohol:  reports no history of alcohol use. Tobacco:  reports that she quit smoking about 12 years ago. Her smoking use included cigarettes. She smoked 1.00 pack per day. She has never used smokeless tobacco. 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 Family: family history includes Diabetes Mellitus II in her brother; Kidney disease in her mother.  Past Surgical History:  Procedure Laterality Date   CATARACT EXTRACTION W/PHACO Right 08/12/2016   Procedure: CATARACT EXTRACTION PHACO AND INTRAOCULAR LENS PLACEMENT ([place])  Right;  Surgeon: Leandrew Koyanagi, MD;  Location: [place];  Service: Ophthalmology;  Laterality: Right;   CATARACT EXTRACTION W/PHACO Left 09/30/2016   Procedure: CATARACT EXTRACTION PHACO AND INTRAOCULAR LENS PLACEMENT ([place]) Left;  Surgeon: Leandrew Koyanagi, MD;  Location: [place];  Service: Ophthalmology;  Laterality: Left;   COLONOSCOPY     COLONOSCOPY WITH PROPOFOL N/A 12/09/2016   Procedure: COLONOSCOPY WITH PROPOFOL;  Surgeon: Toledo, Benay Pike, MD;  Location: [facility];  Service: Endoscopy;  Laterality: N/A;   ESOPHAGOGASTRODUODENOSCOPY     ESOPHAGOGASTRODUODENOSCOPY (EGD) WITH PROPOFOL N/A 12/09/2016   Procedure: ESOPHAGOGASTRODUODENOSCOPY (EGD) WITH PROPOFOL;  Surgeon: Toledo, Benay Pike, MD;  Location: [facility];  Service: Endoscopy;  Laterality: N/A;   KYPHOPLASTY N/A 08/10/2017   Procedure: Hewitt Shorts;  Surgeon: Hessie Knows, MD;  Location: [facility];  Service: Orthopedics;  Laterality: N/A;   KYPHOPLASTY N/A 09/09/2017   Procedure: TFTDDUKGURK-Y7,C6;  Surgeon: Hessie Knows, MD;  Location: [facility];  Service: Orthopedics;  Laterality: N/A;   KYPHOPLASTY N/A 09/28/2017   Procedure: [id number];  Surgeon: Hessie Knows, MD;  Location: [facility];  Service: Orthopedics;  Laterality: N/A;   KYPHOPLASTY N/A 06/19/2019   Procedure: KYPHOPLASTY T5;  Surgeon: Hessie Knows, MD;  Location: [facility];  Service: Orthopedics;  Laterality: N/A;   OOPHORECTOMY Bilateral 2003   RIGHT/LEFT HEART CATH AND CORONARY ANGIOGRAPHY Bilateral  10/19/2019   Procedure: RIGHT/LEFT HEART CATH AND CORONARY ANGIOGRAPHY;  Surgeon: Yolonda Kida, MD;  Location: [facility];  Service: Cardiovascular;  Laterality: Bilateral;   Active Ambulatory Problems    Diagnosis Date Noted   Normocytic anemia 08/30/2016   B12 deficiency 08/31/2016   Iron deficiency anemia 09/01/2016   Guaiac positive stools 11/26/2016   Fatigue 05/29/2017   Exertional dyspnea 05/29/2017   Abdominal pain, RUQ 08/06/2017   Intractable back pain 08/08/2017   Compression fracture of lumbar vertebra (HCC) 08/08/2017   Flank pain    Compression fracture of body of thoracic vertebra (HCC) 06/16/2019   Centrilobular emphysema ([place]) 05/09/2015   GERD (gastroesophageal reflux disease) 06/18/2019   HTN (hypertension), benign 12/10/2013   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[place]) 06/18/2019   H/O kyphoplasty 06/18/2019   Resolved Ambulatory Problems    Diagnosis Date Noted   No Resolved Ambulatory Problems   Past Medical History:  Diagnosis Date   Anemia    Anxiety    Arthritis    Atony of gallbladder    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 In no apparent acute distress Vitals:   10/26/19 0952  BP: (!) 141/82  Pulse: (!) 104  Resp: 18  Temp: (!) 97.2 F (36.2 C)  TempSrc: Temporal  SpO2: 99%  Weight: 124 lb (56.2 kg)  Height: '5\' 2"'$  (1.575 m)   BMI Assessment: Estimated body mass index is 22.68 kg/m as calculated from the following:   Height as of this encounter: '5\' 2"'$  (1.575 m).   Weight as of this encounter: 124 lb (56.2  kg).  BMI interpretation table: BMI level Category Range association with higher incidence of chronic pain  <18 kg/m2 Underweight   18.5-24.9 kg/m2 Ideal body weight   25-29.9 kg/m2 Overweight Increased incidence by 20%  30-34.9 kg/m2 Obese (Class I) Increased incidence by 68%  35-39.9 kg/m2 Severe obesity (Class II) Increased incidence by 136%  >40 kg/m2 Extreme obesity (Class III) Increased incidence by 254%   Patient's current BMI Ideal Body weight  Body mass index is 22.68 kg/m. Ideal body weight: 50.1 kg (110 lb 7.2 oz) Adjusted ideal body weight: 52.6 kg (115 lb 13.9 oz)   BMI Readings from Last 4 Encounters:  10/26/19 22.68 kg/m  10/19/19 22.68 kg/m  10/04/19 24.31 kg/m  09/16/19 24.33 kg/m   Wt Readings from Last 4 Encounters:  10/26/19 124 lb (56.2 kg)  10/19/19 124 lb (56.2 kg)  10/04/19 132 lb 15 oz (60.3 kg)  09/16/19 133 lb (60.3 kg)    Psych/Mental status: Alert, oriented x 3 (person, place, & time)       Eyes: PERLA Respiratory: No evidence of acute respiratory distress  Cervical Spine Exam  Skin & Axial Inspection: No masses, redness, edema, swelling, or associated skin lesions Alignment: Symmetrical Functional ROM: Pain restricted ROM, to the right Stability: No instability detected Muscle Tone/Strength: Functionally intact. No obvious neuro-muscular anomalies detected. Sensory (Neurological): Musculoskeletal pain pattern Right C4,5,6 region Palpation: Complains of area being tender to palpation  Upper Extremity (UE) Exam    Side: Right upper extremity  Side: Left upper extremity  Skin & Extremity Inspection: Skin color, temperature, and hair growth are WNL. No peripheral edema or cyanosis. No masses, redness, swelling, asymmetry, or associated skin lesions. No contractures.  Skin & Extremity Inspection: Skin color, temperature, and hair growth are WNL. No peripheral edema or cyanosis. No masses, redness, swelling, asymmetry, or associated  skin lesions. No contractures.  Functional ROM: Unrestricted ROM          Functional ROM: Unrestricted ROM          Muscle Tone/Strength: Functionally intact. No obvious neuro-muscular anomalies detected.   Muscle Tone/Strength: Functionally intact. No obvious neuro-muscular anomalies detected.  Sensory (Neurological): Unimpaired          Sensory (Neurological): Unimpaired          Palpation: No palpable anomalies              Palpation: No palpable anomalies              Provocative Test(s):  Phalen's test: deferred Tinel's test: deferred Apley's scratch test (touch opposite shoulder):  Action 1 (Across chest): deferred Action 2 (Overhead): deferred Action 3 (LB reach): deferred   Provocative Test(s):  Phalen's test: deferred Tinel's test: deferred Apley's scratch test (touch opposite shoulder):  Action 1 (Across chest): deferred Action 2 (Overhead): deferred Action 3 (LB reach): deferred    Thoracic Spine Area Exam  Skin & Axial Inspection: prominent thoracic Kyphosis Alignment: Asymmetric Functional ROM: Pain restricted ROM Stability: No instability detected Muscle Tone/Strength: Functionally intact. No obvious neuro-muscular anomalies detected. Sensory (Neurological): Neurogenic pain pattern Muscle strength & Tone: No palpable anomalies  Lumbar Exam  Skin & Axial Inspection: Well healed scar from previous spine surgery detected Alignment: Scoliosis detected Functional ROM: Decreased ROM       Stability: No instability detected Muscle Tone/Strength: Functionally intact. No obvious neuro-muscular anomalies detected. Sensory (Neurological): Musculoskeletal pain pattern Palpation: Complains of area being tender to palpation         Gait & Posture Assessment  Ambulation: Limited Gait: Age-related, senile gait pattern Posture: Thoracic kyphosis   Lower Extremity Exam    Side: Right lower extremity  Side: Left lower extremity  Stability: No instability observed           Stability: No instability observed          Skin & Extremity Inspection: Skin color, temperature, and hair growth are WNL. No peripheral edema or cyanosis. No masses, redness, swelling, asymmetry, or associated skin lesions. No contractures.  Skin & Extremity Inspection: Skin color, temperature, and hair growth are WNL. No peripheral edema or cyanosis. No masses, redness, swelling, asymmetry, or associated skin lesions. No contractures.  Functional ROM: Pain restricted ROM for hip joint          Functional ROM: Pain restricted ROM for hip joint          Muscle Tone/Strength: Functionally intact. No obvious neuro-muscular anomalies detected.  Muscle Tone/Strength: Functionally intact. No obvious neuro-muscular anomalies detected.  Sensory (Neurological): Unimpaired        Sensory (Neurological): Unimpaired        DTR: Patellar: deferred today Achilles: deferred today Plantar: deferred today  DTR: Patellar: deferred today Achilles: deferred today Plantar: deferred today  Palpation: No palpable anomalies  Palpation: No palpable anomalies   Assessment  Primary Diagnosis & Pertinent Problem List: The primary encounter diagnosis was Compression fracture of body of thoracic vertebra ([place]). Diagnoses  of Cervical facet joint syndrome, Arthropathy of thoracic facet joint, Thoracic disc herniation, Other kyphosis of thoracic region, and Chronic pain syndrome were also pertinent to this visit.  Visit Diagnosis (New problems to examiner): 1. Compression fracture of body of thoracic vertebra (HCC)   2. Cervical facet joint syndrome   3. Arthropathy of thoracic facet joint   4. Thoracic disc herniation   5. Other kyphosis of thoracic region   6. Chronic pain syndrome    Plan of Care (Initial workup plan)    Procedure Orders     CERVICAL FACET (MEDIAL BRANCH NERVE BLOCK)      THORACIC FACET BLOCK   Interventional management options: Ms. Veno was informed that there is no guarantee that she  would be a candidate for interventional therapies. The decision will be based on the results of diagnostic studies, as well as Ms. Parlee's risk profile.  Procedure(s) under consideration:  Right C4, C5, C6 diagnostic cervical facet medial branch nerve block Thoracic facet medial branch nerve block, T5, T6 bilaterally   Provider-requested follow-up: Return in about 1 week (around 11/02/2019) for R Cervical Facet, B/L thoracic facet , without sedation (40 mins).  Future Appointments  Date Time Provider [place]  11/06/2019  9:15 AM Gillis Santa, MD [facility] None  11/09/2019  9:00 AM [facility] None  02/14/2020  3:45 PM [facility] None  02/15/2020 11:00 AM Lequita Asal, MD [facility] None  02/15/2020 11:30 AM [facility] INFUSION CHAIR 1 [facility] None    Note by: Gillis Santa, MD Date: 10/26/2019; Time: 11:04 AM

## 2019-10-26 NOTE — Patient Instructions (Signed)
Pain Management Discharge Instructions  General Discharge Instructions :  If you need to reach your doctor call: Monday-Friday 8:00 am - 4:00 pm at [phone] or toll free [phone].  After clinic hours [phone] to have operator reach doctor.  Bring all of your medication bottles to all your appointments in the pain clinic.  To cancel or reschedule your appointment with Pain Management please remember to call 24 hours in advance to avoid a fee.  Refer to the educational materials which you have been given on: General Risks, I had my Procedure. Discharge Instructions, Post Sedation.  Post Procedure Instructions:  The drugs you were given will stay in your system until tomorrow, so for the next 24 hours you should not drive, make any legal decisions or drink any alcoholic beverages.  You may eat anything you prefer, but it is better to start with liquids then soups and crackers, and gradually work up to solid foods.  Please notify your doctor immediately if you have any unusual bleeding, trouble breathing or pain that is not related to your normal pain.  Depending on the type of procedure that was done, some parts of your body may feel week and/or numb.  This usually clears up by tonight or the next day.  Walk with the use of an assistive device or accompanied by an adult for the 24 hours.  You may use ice on the affected area for the first 24 hours.  Put ice in a Ziploc bag and cover with a towel and place against area 15 minutes on 15 minutes off.  You may switch to heat after 24 hours.GENERAL RISKS AND COMPLICATIONS  What are the risk, side effects and possible complications? Generally speaking, most procedures are safe.  However, with any procedure there are risks, side effects, and the possibility of complications.  The risks and complications are dependent upon the sites that are lesioned, or the type of nerve block to be performed.  The closer the procedure is to the spine,  the more serious the risks are.  Great care is taken when placing the radio frequency needles, block needles or lesioning probes, but sometimes complications can occur. 1. Infection: Any time there is an injection through the skin, there is a risk of infection.  This is why sterile conditions are used for these blocks.  There are four possible types of infection. 1. Localized skin infection. 2. Central Nervous System Infection-This can be in the form of Meningitis, which can be deadly. 3. Epidural Infections-This can be in the form of an epidural abscess, which can cause pressure inside of the spine, causing compression of the spinal cord with subsequent paralysis. This would require an emergency surgery to decompress, and there are no guarantees that the patient would recover from the paralysis. 4. Discitis-This is an infection of the intervertebral discs.  It occurs in about 1% of discography procedures.  It is difficult to treat and it may lead to surgery.        2. Pain: the needles have to go through skin and soft tissues, will cause soreness.       3. Damage to internal structures:  The nerves to be lesioned may be near blood vessels or    other nerves which can be potentially damaged.       4. Bleeding: Bleeding is more common if the patient is taking blood thinners such as  aspirin, Coumadin, Ticiid, Plavix, etc., or if he/she have some genetic predisposition  such as   hemophilia. Bleeding into the spinal canal can cause compression of the spinal  cord with subsequent paralysis.  This would require an emergency surgery to  decompress and there are no guarantees that the patient would recover from the  paralysis.       5. Pneumothorax:  Puncturing of a lung is a possibility, every time a needle is introduced in  the area of the chest or upper back.  Pneumothorax refers to free air around the  collapsed lung(s), inside of the thoracic cavity (chest cavity).  Another two possible  complications  related to a similar event would include: Hemothorax and Chylothorax.   These are variations of the Pneumothorax, where instead of air around the collapsed  lung(s), you may have blood or chyle, respectively.       6. Spinal headaches: They may occur with any procedures in the area of the spine.       7. Persistent CSF (Cerebro-Spinal Fluid) leakage: This is a rare problem, but may occur  with prolonged intrathecal or epidural catheters either due to the formation of a fistulous  track or a dural tear.       8. Nerve damage: By working so close to the spinal cord, there is always a possibility of  nerve damage, which could be as serious as a permanent spinal cord injury with  paralysis.       9. Death:  Although rare, severe deadly allergic reactions known as "Anaphylactic  reaction" can occur to any of the medications used.      10. Worsening of the symptoms:  We can always make thing worse.  What are the chances of something like this happening? Chances of any of this occuring are extremely low.  By statistics, you have more of a chance of getting killed in a motor vehicle accident: while driving to the hospital than any of the above occurring .  Nevertheless, you should be aware that they are possibilities.  In general, it is similar to taking a shower.  Everybody knows that you can slip, hit your head and get killed.  Does that mean that you should not shower again?  Nevertheless always keep in mind that statistics do not mean anything if you happen to be on the wrong side of them.  Even if a procedure has a 1 (one) in a 1,000,000 (million) chance of going wrong, it you happen to be that one..Also, keep in mind that by statistics, you have more of a chance of having something go wrong when taking medications.  Who should not have this procedure? If you are on a blood thinning medication (e.g. Coumadin, Plavix, see list of "Blood Thinners"), or if you have an active infection going on, you should not  have the procedure.  If you are taking any blood thinners, please inform your physician.  How should I prepare for this procedure?  Do not eat or drink anything at least six hours prior to the procedure.  Bring a driver with you .  It cannot be a taxi.  Come accompanied by an adult that can drive you back, and that is strong enough to help you if your legs get weak or numb from the local anesthetic.  Take all of your medicines the morning of the procedure with just enough water to swallow them.  If you have diabetes, make sure that you are scheduled to have your procedure done first thing in the morning, whenever possible.  If you have diabetes,   take only half of your insulin dose and notify our nurse that you have done so as soon as you arrive at the clinic.  If you are diabetic, but only take blood sugar pills (oral hypoglycemic), then do not take them on the morning of your procedure.  You may take them after you have had the procedure.  Do not take aspirin or any aspirin-containing medications, at least eleven (11) days prior to the procedure.  They may prolong bleeding.  Wear loose fitting clothing that may be easy to take off and that you would not mind if it got stained with Betadine or blood.  Do not wear any jewelry or perfume  Remove any nail coloring.  It will interfere with some of our monitoring equipment.  NOTE: Remember that this is not meant to be interpreted as a complete list of all possible complications.  Unforeseen problems may occur.  BLOOD THINNERS The following drugs contain aspirin or other products, which can cause increased bleeding during surgery and should not be taken for 2 weeks prior to and 1 week after surgery.  If you should need take something for relief of minor pain, you may take acetaminophen which is found in Tylenol,m Datril, Anacin-3 and Panadol. It is not blood thinner. The products listed below are.  Do not take any of the products listed below  in addition to any listed on your instruction sheet.  A.P.C or A.P.C with Codeine Codeine Phosphate Capsules #3 Ibuprofen Ridaura  ABC compound Congesprin Imuran rimadil  Advil Cope Indocin Robaxisal  Alka-Seltzer Effervescent Pain Reliever and Antacid Coricidin or Coricidin-D  Indomethacin Rufen  Alka-Seltzer plus Cold Medicine Cosprin Ketoprofen S-A-C Tablets  Anacin Analgesic Tablets or Capsules Coumadin Korlgesic Salflex  Anacin Extra Strength Analgesic tablets or capsules CP-2 Tablets Lanoril Salicylate  Anaprox Cuprimine Capsules Levenox Salocol  Anexsia-D Dalteparin Magan Salsalate  Anodynos Darvon compound Magnesium Salicylate Sine-off  Ansaid Dasin Capsules Magsal Sodium Salicylate  Anturane Depen Capsules Marnal Soma  APF Arthritis pain formula Dewitt's Pills Measurin Stanback  Argesic Dia-Gesic Meclofenamic Sulfinpyrazone  Arthritis Bayer Timed Release Aspirin Diclofenac Meclomen Sulindac  Arthritis pain formula Anacin Dicumarol Medipren Supac  Analgesic (Safety coated) Arthralgen Diffunasal Mefanamic Suprofen  Arthritis Strength Bufferin Dihydrocodeine Mepro Compound Suprol  Arthropan liquid Dopirydamole Methcarbomol with Aspirin Synalgos  ASA tablets/Enseals Disalcid Micrainin Tagament  Ascriptin Doan's Midol Talwin  Ascriptin A/D Dolene Mobidin Tanderil  Ascriptin Extra Strength Dolobid Moblgesic Ticlid  Ascriptin with Codeine Doloprin or Doloprin with Codeine Momentum Tolectin  Asperbuf Duoprin Mono-gesic Trendar  Aspergum Duradyne Motrin or Motrin IB Triminicin  Aspirin plain, buffered or enteric coated Durasal Myochrisine Trigesic  Aspirin Suppositories Easprin Nalfon Trillsate  Aspirin with Codeine Ecotrin Regular or Extra Strength Naprosyn Uracel  Atromid-S Efficin Naproxen Ursinus  Auranofin Capsules Elmiron Neocylate Vanquish  Axotal Emagrin Norgesic Verin  Azathioprine Empirin or Empirin with Codeine Normiflo Vitamin E  Azolid Emprazil Nuprin Voltaren  Bayer  Aspirin plain, buffered or children's or timed BC Tablets or powders Encaprin Orgaran Warfarin Sodium  Buff-a-Comp Enoxaparin Orudis Zorpin  Buff-a-Comp with Codeine Equegesic Os-Cal-Gesic   Buffaprin Excedrin plain, buffered or Extra Strength Oxalid   Bufferin Arthritis Strength Feldene Oxphenbutazone   Bufferin plain or Extra Strength Feldene Capsules Oxycodone with Aspirin   Bufferin with Codeine Fenoprofen Fenoprofen Pabalate or Pabalate-SF   Buffets II Flogesic Panagesic   Buffinol plain or Extra Strength Florinal or Florinal with Codeine Panwarfarin   Buf-Tabs Flurbiprofen Penicillamine   Butalbital Compound Four-way cold tablets   Penicillin   Butazolidin Fragmin Pepto-Bismol   Carbenicillin Geminisyn Percodan   Carna Arthritis Reliever Geopen Persantine   Carprofen Gold's salt Persistin   Chloramphenicol Goody's Phenylbutazone   Chloromycetin Haltrain Piroxlcam   Clmetidine heparin Plaquenil   Cllnoril Hyco-pap Ponstel   Clofibrate Hydroxy chloroquine Propoxyphen         Before stopping any of these medications, be sure to consult the physician who ordered them.  Some, such as Coumadin (Warfarin) are ordered to prevent or treat serious conditions such as "deep thrombosis", "pumonary embolisms", and other heart problems.  The amount of time that you may need off of the medication may also vary with the medication and the reason for which you were taking it.  If you are taking any of these medications, please make sure you notify your pain physician before you undergo any procedures.         Facet Blocks Patient Information  Description: The facets are joints in the spine between the vertebrae.  Like any joints in the body, facets can become irritated and painful.  Arthritis can also effect the facets.  By injecting steroids and local anesthetic in and around these joints, we can temporarily block the nerve supply to them.  Steroids act directly on irritated nerves and tissues  to reduce selling and inflammation which often leads to decreased pain.  Facet blocks may be done anywhere along the spine from the neck to the low back depending upon the location of your pain.   After numbing the skin with local anesthetic (like Novocaine), a small needle is passed onto the facet joints under x-ray guidance.  You may experience a sensation of pressure while this is being done.  The entire block usually lasts about 15-25 minutes.   Conditions which may be treated by facet blocks:   Low back/buttock pain  Neck/shoulder pain  Certain types of headaches  Preparation for the injection:  1. Do not eat any solid food or dairy products within 8 hours of your appointment. 2. You may drink clear liquid up to 3 hours before appointment.  Clear liquids include water, black coffee, juice or soda.  No milk or cream please. 3. You may take your regular medication, including pain medications, with a sip of water before your appointment.  Diabetics should hold regular insulin (if taken separately) and take 1/2 normal NPH dose the morning of the procedure.  Carry some sugar containing items with you to your appointment. 4. A driver must accompany you and be prepared to drive you home after your procedure. 5. Bring all your current medications with you. 6. An IV may be inserted and sedation may be given at the discretion of the physician. 7. A blood pressure cuff, EKG and other monitors will often be applied during the procedure.  Some patients may need to have extra oxygen administered for a short period. 8. You will be asked to provide medical information, including your allergies and medications, prior to the procedure.  We must know immediately if you are taking blood thinners (like Coumadin/Warfarin) or if you are allergic to IV iodine contrast (dye).  We must know if you could possible be pregnant.  Possible side-effects:   Bleeding from needle site  Infection (rare, may require  surgery)  Nerve injury (rare)  Numbness & tingling (temporary)  Difficulty urinating (rare, temporary)  Spinal headache (a headache worse with upright posture)  Light-headedness (temporary)  Pain at injection site (serveral days)  Decreased blood pressure (rare,   temporary)  Weakness in arm/leg (temporary)  Pressure sensation in back/neck (temporary)   Call if you experience:   Fever/chills associated with headache or increased back/neck pain  Headache worsened by an upright position  New onset, weakness or numbness of an extremity below the injection site  Hives or difficulty breathing (go to the emergency room)  Inflammation or drainage at the injection site(s)  Severe back/neck pain greater than usual  New symptoms which are concerning to you  Please note:  Although the local anesthetic injected can often make your back or neck feel good for several hours after the injection, the pain will likely return. It takes 3-7 days for steroids to work.  You may not notice any pain relief for at least one week.  If effective, we will often do a series of 2-3 injections spaced 3-6 weeks apart to maximally decrease your pain.  After the initial series, you may be a candidate for a more permanent nerve block of the facets.  If you have any questions, please call [phone] [facility] 

## 2019-10-26 NOTE — Progress Notes (Signed)
Safety precautions to be maintained throughout the outpatient stay will include: orient to surroundings, keep bed in low position, maintain call bell within reach at all times, provide assistance with transfer out of bed and ambulation.  

## 2019-10-31 ENCOUNTER — Telehealth: Payer: Self-pay | Admitting: Student in an Organized Health Care Education/Training Program

## 2019-10-31 NOTE — Telephone Encounter (Signed)
She is under the impression that no steroids will be used for the facet block. I do not see any indication of that in your notes. Will you please clarify?

## 2019-10-31 NOTE — Telephone Encounter (Signed)
Pt called and stated that she would like to know what kind of injections she will be getting in her back since Dr Holley Raring isn't going to be using steroids.

## 2019-10-31 NOTE — Telephone Encounter (Signed)
Patient notified

## 2019-11-02 DIAGNOSIS — Z5320 Procedure and treatment not carried out because of patient's decision for unspecified reasons: Secondary | ICD-10-CM | POA: Insufficient documentation

## 2019-11-06 ENCOUNTER — Other Ambulatory Visit: Payer: Self-pay

## 2019-11-06 ENCOUNTER — Ambulatory Visit
Admission: RE | Admit: 2019-11-06 | Discharge: 2019-11-06 | Disposition: A | Discharge status: Home / Self Care | Payer: Medicare Other | Source: Ambulatory Visit | Attending: Student in an Organized Health Care Education/Training Program | Admitting: Student in an Organized Health Care Education/Training Program

## 2019-11-06 ENCOUNTER — Ambulatory Visit (HOSPITAL_BASED_OUTPATIENT_CLINIC_OR_DEPARTMENT_OTHER): Payer: Medicare Other | Admitting: Student in an Organized Health Care Education/Training Program

## 2019-11-06 ENCOUNTER — Encounter: Payer: Self-pay | Admitting: Student in an Organized Health Care Education/Training Program

## 2019-11-06 VITALS — BP 145/86 | HR 97 | Temp 97.3°F | Resp 20 | Ht 62.0 in | Wt 122.0 lb

## 2019-11-06 DIAGNOSIS — M47812 Spondylosis without myelopathy or radiculopathy, cervical region: Secondary | ICD-10-CM

## 2019-11-06 DIAGNOSIS — M5124 Other intervertebral disc displacement, thoracic region: Secondary | ICD-10-CM | POA: Diagnosis present

## 2019-11-06 DIAGNOSIS — M47814 Spondylosis without myelopathy or radiculopathy, thoracic region: Secondary | ICD-10-CM | POA: Diagnosis present

## 2019-11-06 DIAGNOSIS — S22000A Wedge compression fracture of unspecified thoracic vertebra, initial encounter for closed fracture: Secondary | ICD-10-CM | POA: Insufficient documentation

## 2019-11-06 DIAGNOSIS — G894 Chronic pain syndrome: Secondary | ICD-10-CM

## 2019-11-06 MED ORDER — LIDOCAINE HCL 2 % IJ SOLN
INTRAMUSCULAR | Status: AC
  Filled 2019-11-06: qty 10

## 2019-11-06 MED ORDER — DEXAMETHASONE SODIUM PHOSPHATE 10 MG/ML IJ SOLN
INTRAMUSCULAR | Status: AC
  Filled 2019-11-06: qty 1

## 2019-11-06 MED ORDER — LIDOCAINE HCL 2 % IJ SOLN
20.0000 mL | Freq: Once | INTRAMUSCULAR | Status: AC
  Administered 2019-11-06: 200 mg

## 2019-11-06 MED ORDER — DEXAMETHASONE SODIUM PHOSPHATE 10 MG/ML IJ SOLN
INTRAMUSCULAR | Status: AC
  Filled 2019-11-06: qty 2

## 2019-11-06 MED ORDER — ROPIVACAINE HCL 2 MG/ML IJ SOLN
9.0000 mL | Freq: Once | INTRAMUSCULAR | Status: AC
  Administered 2019-11-06: 10 mL via PERINEURAL

## 2019-11-06 MED ORDER — DEXAMETHASONE SODIUM PHOSPHATE 10 MG/ML IJ SOLN
20.0000 mg | Freq: Once | INTRAMUSCULAR | Status: AC
  Administered 2019-11-06: 10 mg

## 2019-11-06 MED ORDER — ROPIVACAINE HCL 2 MG/ML IJ SOLN
INTRAMUSCULAR | Status: AC
  Filled 2019-11-06: qty 20

## 2019-11-06 MED ORDER — DEXAMETHASONE SODIUM PHOSPHATE 10 MG/ML IJ SOLN
10.0000 mg | Freq: Once | INTRAMUSCULAR | Status: AC
  Administered 2019-11-06: 10 mg

## 2019-11-06 NOTE — Patient Instructions (Signed)

## 2019-11-06 NOTE — Progress Notes (Signed)
Safety precautions to be maintained throughout the outpatient stay will include: orient to surroundings, keep bed in low position, maintain call bell within reach at all times, provide assistance with transfer out of bed and ambulation.  

## 2019-11-06 NOTE — Progress Notes (Signed)
PROVIDER NOTE: Information contained herein reflects review and annotations entered in association with encounter. Interpretation of such information and data should be left to medically-trained personnel. Information provided to patient can be located elsewhere in the medical record under "Patient Instructions". Document created using STT-dictation technology, any transcriptional errors that may result from process are unintentional.    Patient: Ashley Mack  Service Category: Procedure  Provider: Gillis Santa, MD  DOB: [birth year]  DOS: 11/06/2019  Location: [facility]  MRN: [id number]  Setting: Ambulatory - outpatient  Referring Provider: Kirk Ruths, MD  Type: Established Patient  Specialty: Interventional Pain Management  PCP: Kirk Ruths, MD   Primary Reason for Visit: Interventional Pain Management Treatment. CC: Back Pain  Procedure:          Anesthesia, Analgesia, Anxiolysis:  Type: Diagnostic Medial Branch Facet Block  #1  Region:Thoracic Level: T6 Medial Branch Level(s) Laterality: Bilateral  Type: Local Anesthesia   Local Anesthetic: Lidocaine 1-2%  Position: Prone   Indications: 1. Cervical facet joint syndrome   2. Arthropathy of thoracic facet joint   3. Thoracic disc herniation   4. Compression fracture of body of thoracic vertebra (HCC)   5. Chronic pain syndrome    Pain Score: Pre-procedure: 6 /10 Post-procedure: 2 /10   Pre-op Assessment:  Ashley Mack is a 74 y.o. (year old), female patient, seen today for interventional treatment. She  has a past surgical history that includes Oophorectomy (Bilateral, 2003); Cataract extraction w/PHACO (Right, 08/12/2016); Cataract extraction w/PHACO (Left, 09/30/2016); Colonoscopy; Esophagogastroduodenoscopy; Colonoscopy with propofol (N/A, 12/09/2016); Esophagogastroduodenoscopy (egd) with propofol (N/A, 12/09/2016); Kyphoplasty (N/A, 08/10/2017); Kyphoplasty (N/A, 09/09/2017); Kyphoplasty (N/A,  09/28/2017); Kyphoplasty (N/A, 06/19/2019); and RIGHT/LEFT HEART CATH AND CORONARY ANGIOGRAPHY (Bilateral, 10/19/2019). Ashley Mack has a current medication list which includes the following prescription(s): albuterol, vitamin d3, dexilant, integra, ondansetron, prednisone, and simply saline. Her primarily concern today is the Back Pain  Initial Vital Signs:  Pulse/HCG Rate: 97ECG Heart Rate: (!) 108 (108) Temp: (!) 97.3 F (36.3 C) Resp: 16 BP: (!) 149/97 SpO2: 97 %  BMI: Estimated body mass index is 22.31 kg/m as calculated from the following:   Height as of this encounter: 5\' 2"  (1.575 m).   Weight as of this encounter: 122 lb (55.3 kg).  Risk Assessment: Allergies: Reviewed. She is allergic to methotrexate derivatives, other, ranitidine, sulfasalazine, and topiramate.  Allergy Precautions: None required Coagulopathies: Reviewed. None identified.  Blood-thinner therapy: None at this time Active Infection(s): Reviewed. None identified. Ashley Mack is afebrile  Site Confirmation: Ashley Mack was asked to confirm the procedure and laterality before marking the site Procedure checklist: Completed Consent: Before the procedure and under the influence of no sedative(s), amnesic(s), or anxiolytics, the patient was informed of the treatment options, risks and possible complications. To fulfill our ethical and legal obligations, as recommended by the American Medical Association's Code of Ethics, I have informed the patient of my clinical impression; the nature and purpose of the treatment or procedure; the risks, benefits, and possible complications of the intervention; the alternatives, including doing nothing; the risk(s) and benefit(s) of the alternative treatment(s) or procedure(s); and the risk(s) and benefit(s) of doing nothing. The patient was provided information about the general risks and possible complications associated with the procedure. These may include, but are not limited to: failure to  achieve desired goals, infection, bleeding, organ or nerve damage, allergic reactions, paralysis, and death. In addition, the patient was informed of those risks and complications associated to  Spine-related procedures, such as failure to decrease pain; infection (i.e.: Meningitis, epidural or intraspinal abscess); bleeding (i.e.: epidural hematoma, subarachnoid hemorrhage, or any other type of intraspinal or peri-dural bleeding); organ or nerve damage (i.e.: Any type of peripheral nerve, nerve root, or spinal cord injury) with subsequent damage to sensory, motor, and/or autonomic systems, resulting in permanent pain, numbness, and/or weakness of one or several areas of the body; allergic reactions; (i.e.: anaphylactic reaction); and/or death. Furthermore, the patient was informed of those risks and complications associated with the medications. These include, but are not limited to: allergic reactions (i.e.: anaphylactic or anaphylactoid reaction(s)); adrenal axis suppression; blood sugar elevation that in diabetics may result in ketoacidosis or comma; water retention that in patients with history of congestive heart failure may result in shortness of breath, pulmonary edema, and decompensation with resultant heart failure; weight gain; swelling or edema; medication-induced neural toxicity; particulate matter embolism and blood vessel occlusion with resultant organ, and/or nervous system infarction; and/or aseptic necrosis of one or more joints. Finally, the patient was informed that Medicine is not an exact science; therefore, there is also the possibility of unforeseen or unpredictable risks and/or possible complications that may result in a catastrophic outcome. The patient indicated having understood very clearly. We have given the patient no guarantees and we have made no promises. Enough time was given to the patient to ask questions, all of which were answered to the patient's satisfaction. Ashley Mack has  indicated that she wanted to continue with the procedure. Attestation: I, the ordering provider, attest that I have discussed with the patient the benefits, risks, side-effects, alternatives, likelihood of achieving goals, and potential problems during recovery for the procedure that I have provided informed consent. Date   Time: 11/06/2019  7:49 AM  Pre-Procedure Preparation:  Monitoring: As per clinic protocol. Respiration, ETCO2, SpO2, BP, heart rate and rhythm monitor placed and checked for adequate function Safety Precautions: Patient was assessed for positional comfort and pressure points before starting the procedure. Time-out: I initiated and conducted the "Time-out" before starting the procedure, as per protocol. The patient was asked to participate by confirming the accuracy of the "Time Out" information. Verification of the correct person, site, and procedure were performed and confirmed by me, the nursing staff, and the patient. "Time-out" conducted as per Joint Commission's Universal Protocol (UP.01.01.01). Time: 0829  Description of Procedure:          Target Area: The target area is a superior and lateral portion of the thoracic transverse processes more distal to the joint itself then and the lumbar region. Approach: Paraspinal approach. Area Prepped: Entire Posterior Thoracic Region DuraPrep (Iodine Povacrylex [0.7% available iodine] and Isopropyl Alcohol, 74% w/w) Safety Precautions: Aspiration looking for blood return was conducted prior to all injections. At no point did we inject any substances, as a needle was being advanced. No attempts were made at seeking any paresthesias. Safe injection practices and needle disposal techniques used. Medications properly checked for expiration dates. SDV (single dose vial) medications used. Description of the Procedure: Protocol guidelines were followed. The patient was placed in position over the fluoroscopy table. The target area was  identified and the area prepped in the usual manner. Skin & deeper tissues infiltrated with local anesthetic. Appropriate amount of time allowed to pass for local anesthetics to take effect. The procedure needles were then advanced to the target area. Proper needle placement secured. Negative aspiration confirmed. Solution injected in intermittent fashion, asking for systemic symptoms every 0.5cc of  injectate. The needles were then removed and the area cleansed, making sure to leave some of the prepping solution back to take advantage of its long term bactericidal properties.  4 cc solution made of 3 cc of 0.2% ropivacaine, 1 cc of Decadron.  2 cc injected on the left, 2 cc injected on the right for the T6 medial branch.       Vitals:   11/06/19 0811 11/06/19 0833 11/06/19 0838 11/06/19 0843  BP: (!) 149/97 (!) 161/88 (!) 156/96 (!) 145/86  Pulse: 97     Resp: 16 20 20 20   Temp: (!) 97.3 F (36.3 C)     SpO2: 97% 95% 94% 96%  Weight: 122 lb (55.3 kg)     Height: 5\' 2"  (1.575 m)       Start Time: 0829 hrs. End Time: 0843 hrs. Imaging Guidance (Spinal):          Type of Imaging Technique: Fluoroscopy Guidance (Spinal) Indication(s): Assistance in needle guidance and placement for procedures requiring needle placement in or near specific anatomical locations not easily accessible without such assistance. Exposure Time: Please see nurses notes. Contrast: None used. Fluoroscopic Guidance: I was personally present during the use of fluoroscopy. "Tunnel Vision Technique" used to obtain the best possible view of the target area. Parallax error corrected before commencing the procedure. "Direction-depth-direction" technique used to introduce the needle under continuous pulsed fluoroscopy. Once target was reached, antero-posterior, oblique, and lateral fluoroscopic projection used confirm needle placement in all planes. Images permanently stored in EMR. Interpretation: No contrast injected. I  personally interpreted the imaging intraoperatively. Adequate needle placement confirmed in multiple planes. Permanent images saved into the patient's record.  Antibiotic Prophylaxis:   Anti-infectives (From admission, onward)   None     Indication(s): None identified  Post-operative Assessment:  Post-procedure Vital Signs:  Pulse/HCG Rate: 97100 (sinus tach) Temp: (!) 97.3 F (36.3 C) Resp: 20 BP: (!) 145/86 SpO2: 96 %  EBL: None  Complications: No immediate post-treatment complications observed by team, or reported by patient.  Note: The patient tolerated the entire procedure well. A repeat set of vitals were taken after the procedure and the patient was kept under observation following institutional policy, for this type of procedure. Post-procedural neurological assessment was performed, showing return to baseline, prior to discharge. The patient was provided with post-procedure discharge instructions, including a section on how to identify potential problems. Should any problems arise concerning this procedure, the patient was given instructions to immediately contact us, at any time, without hesitation. In any case, we plan to contact the patient by telephone for a follow-up status report regarding this interventional procedure.  Comments:  No additional relevant information.  Plan of Care  Orders:  Orders Placed This Encounter  Procedures   DG PAIN [facility] C-ARM 1-60 MIN NO REPORT    Intraoperative interpretation by procedural physician at [place].    Standing Status:   Standing    Number of Occurrences:   1    Order Specific Question:   Reason for exam:    Answer:   Assistance in needle guidance and placement for procedures requiring needle placement in or near specific anatomical locations not easily accessible without such assistance.   Medications ordered for procedure: Meds ordered this encounter  Medications   lidocaine (XYLOCAINE) 2 % (with pres)  injection 400 mg   ropivacaine (PF) 2 mg/mL (0.2%) (NAROPIN) injection 9 mL   ropivacaine (PF) 2 mg/mL (0.2%) (NAROPIN) injection 9 mL  dexamethasone (DECADRON) injection 10 mg   dexamethasone (DECADRON) injection 20 mg   Medications administered: We administered lidocaine, ropivacaine (PF) 2 mg/mL (0.2%), ropivacaine (PF) 2 mg/mL (0.2%), dexamethasone, and dexamethasone.  See the medical record for exact dosing, route, and time of administration.  Follow-up plan:   Return in about 4 weeks (around 12/04/2019) for Post Procedure Evaluation, virtual.    Recent Visits Date Type Provider Dept  10/26/19 Office Visit Gillis Santa, MD [facility]  Showing recent visits within past 90 days and meeting all other requirements Today's Visits Date Type Provider Dept  11/06/19 Procedure visit Gillis Santa, MD [facility]  Showing today's visits and meeting all other requirements Future Appointments Date Type Provider Dept  12/04/19 Appointment Gillis Santa, MD [facility]  Showing future appointments within next 90 days and meeting all other requirements  Disposition: Discharge home  Discharge (Date   Time): 11/06/2019; 0850 hrs.   Primary Care Physician: Kirk Ruths, MD Location: [facility] Note by: Gillis Santa, MD Date: 11/06/2019; Time: 10:48 AM  Disclaimer:  Medicine is not an exact science. The only guarantee in medicine is that nothing is guaranteed. It is important to note that the decision to proceed with this intervention was based on the information collected from the patient. The Data and conclusions were drawn from the patient's questionnaire, the interview, and the physical examination. Because the information was provided in large part by the patient, it cannot be guaranteed that it has not been purposely or unconsciously manipulated. Every effort has been made to obtain as much relevant data as possible for  this evaluation. It is important to note that the conclusions that lead to this procedure are derived in large part from the available data. Always take into account that the treatment will also be dependent on availability of resources and existing treatment guidelines, considered by other Pain Management Practitioners as being common knowledge and practice, at the time of the intervention. For Medico-Legal purposes, it is also important to point out that variation in procedural techniques and pharmacological choices are the acceptable norm. The indications, contraindications, technique, and results of the above procedure should only be interpreted and judged by a Board-Certified Interventional Pain Specialist with extensive familiarity and expertise in the same exact procedure and technique.

## 2019-11-06 NOTE — Progress Notes (Signed)
PROVIDER NOTE: Information contained herein reflects review and annotations entered in association with encounter. Interpretation of such information and data should be left to medically-trained personnel. Information provided to patient can be located elsewhere in the medical record under "Patient Instructions". Document created using STT-dictation technology, any transcriptional errors that may result from process are unintentional.    Patient: Ashley Mack  Service Category: Procedure  Provider: Gillis Santa, MD  DOB: 1945/08/13  DOS: 11/06/2019  Location: [facility]  MRN: [id number]  Setting: Ambulatory - outpatient  Referring Provider: Kirk Ruths, MD  Type: Established Patient  Specialty: Interventional Pain Management  PCP: Kirk Ruths, MD   Primary Reason for Visit: Interventional Pain Management Treatment. CC: Back Pain  Procedure:          Anesthesia, Analgesia, Anxiolysis:  Type: Cervical Facet Medial Branch Block(s)  #1  Primary Purpose: Diagnostic Region: Posterolateral cervical spine Level: C4, C5, C6, Medial Branch Level(s). Injecting these levels blocks the  C4-5, C5-6,cervical facet joints. Laterality: Right  Type: Local Anesthesia Indication(s): Analgesia and Anxiety Route: Intravenous (IV) IV Access: Secured Sedation: Meaningful verbal contact was maintained at all times during the procedure  Local Anesthetic: Lidocaine 1-2%  Position: Prone with head of the table raised to facilitate breathing.   Indications: 1. Cervical facet joint syndrome   2. Arthropathy of thoracic facet joint   3. Thoracic disc herniation   4. Compression fracture of body of thoracic vertebra (HCC)   5. Chronic pain syndrome    Pain Score: Pre-procedure: 6 /10 Post-procedure: 2 /10   Pre-op Assessment:  Ms. Lagares is a 74 y.o. (year old), female patient, seen today for interventional treatment. She  has a past surgical history that includes Oophorectomy  (Bilateral, 2003); Cataract extraction w/PHACO (Right, 08/12/2016); Cataract extraction w/PHACO (Left, 09/30/2016); Colonoscopy; Esophagogastroduodenoscopy; Colonoscopy with propofol (N/A, 12/09/2016); Esophagogastroduodenoscopy (egd) with propofol (N/A, 12/09/2016); Kyphoplasty (N/A, 08/10/2017); Kyphoplasty (N/A, 09/09/2017); Kyphoplasty (N/A, 09/28/2017); Kyphoplasty (N/A, 06/19/2019); and RIGHT/LEFT HEART CATH AND CORONARY ANGIOGRAPHY (Bilateral, 10/19/2019). Ms. Parisi has a current medication list which includes the following prescription(s): albuterol, vitamin d3, dexilant, integra, ondansetron, prednisone, and simply saline. Her primarily concern today is the Back Pain  Initial Vital Signs:  Pulse/HCG Rate: 97ECG Heart Rate: (!) 108 (108) Temp: (!) 97.3 F (36.3 C) Resp: 16 BP: (!) 149/97 SpO2: 97 %  BMI: Estimated body mass index is 22.31 kg/m as calculated from the following:   Height as of this encounter: 5\' 2"  (1.575 m).   Weight as of this encounter: 122 lb (55.3 kg).  Risk Assessment: Allergies: Reviewed. She is allergic to methotrexate derivatives, other, ranitidine, sulfasalazine, and topiramate.  Allergy Precautions: None required Coagulopathies: Reviewed. None identified.  Blood-thinner therapy: None at this time Active Infection(s): Reviewed. None identified. Ms. Parsley is afebrile  Site Confirmation: Ms. Vegh was asked to confirm the procedure and laterality before marking the site Procedure checklist: Completed Consent: Before the procedure and under the influence of no sedative(s), amnesic(s), or anxiolytics, the patient was informed of the treatment options, risks and possible complications. To fulfill our ethical and legal obligations, as recommended by the American Medical Association's Code of Ethics, I have informed the patient of my clinical impression; the nature and purpose of the treatment or procedure; the risks, benefits, and possible complications of the intervention;  the alternatives, including doing nothing; the risk(s) and benefit(s) of the alternative treatment(s) or procedure(s); and the risk(s) and benefit(s) of doing nothing. The patient was provided information  about the general risks and possible complications associated with the procedure. These may include, but are not limited to: failure to achieve desired goals, infection, bleeding, organ or nerve damage, allergic reactions, paralysis, and death. In addition, the patient was informed of those risks and complications associated to Spine-related procedures, such as failure to decrease pain; infection (i.e.: Meningitis, epidural or intraspinal abscess); bleeding (i.e.: epidural hematoma, subarachnoid hemorrhage, or any other type of intraspinal or peri-dural bleeding); organ or nerve damage (i.e.: Any type of peripheral nerve, nerve root, or spinal cord injury) with subsequent damage to sensory, motor, and/or autonomic systems, resulting in permanent pain, numbness, and/or weakness of one or several areas of the body; allergic reactions; (i.e.: anaphylactic reaction); and/or death. Furthermore, the patient was informed of those risks and complications associated with the medications. These include, but are not limited to: allergic reactions (i.e.: anaphylactic or anaphylactoid reaction(s)); adrenal axis suppression; blood sugar elevation that in diabetics may result in ketoacidosis or comma; water retention that in patients with history of congestive heart failure may result in shortness of breath, pulmonary edema, and decompensation with resultant heart failure; weight gain; swelling or edema; medication-induced neural toxicity; particulate matter embolism and blood vessel occlusion with resultant organ, and/or nervous system infarction; and/or aseptic necrosis of one or more joints. Finally, the patient was informed that Medicine is not an exact science; therefore, there is also the possibility of unforeseen or  unpredictable risks and/or possible complications that may result in a catastrophic outcome. The patient indicated having understood very clearly. We have given the patient no guarantees and we have made no promises. Enough time was given to the patient to ask questions, all of which were answered to the patient's satisfaction. Ms. Hettich has indicated that she wanted to continue with the procedure. Attestation: I, the ordering provider, attest that I have discussed with the patient the benefits, risks, side-effects, alternatives, likelihood of achieving goals, and potential problems during recovery for the procedure that I have provided informed consent. Date   Time: 11/06/2019  7:49 AM  Pre-Procedure Preparation:  Monitoring: As per clinic protocol. Respiration, ETCO2, SpO2, BP, heart rate and rhythm monitor placed and checked for adequate function Safety Precautions: Patient was assessed for positional comfort and pressure points before starting the procedure. Time-out: I initiated and conducted the "Time-out" before starting the procedure, as per protocol. The patient was asked to participate by confirming the accuracy of the "Time Out" information. Verification of the correct person, site, and procedure were performed and confirmed by me, the nursing staff, and the patient. "Time-out" conducted as per Joint Commission's Universal Protocol (UP.01.01.01). Time: 0829  Description of Procedure:          Laterality: Right Level: C4, C5, C6,Medial Branch Level(s). Area Prepped: Posterior Cervico-thoracic Region DuraPrep (Iodine Povacrylex [0.7% available iodine] and Isopropyl Alcohol, 74% w/w) Safety Precautions: Aspiration looking for blood return was conducted prior to all injections. At no point did we inject any substances, as a needle was being advanced. Before injecting, the patient was told to immediately notify me if she was experiencing any new onset of "ringing in the ears, or metallic taste  in the mouth". No attempts were made at seeking any paresthesias. Safe injection practices and needle disposal techniques used. Medications properly checked for expiration dates. SDV (single dose vial) medications used. After the completion of the procedure, all disposable equipment used was discarded in the proper designated medical waste containers. Local Anesthesia: Protocol guidelines were followed. The patient was  positioned over the fluoroscopy table. The area was prepped in the usual manner. The time-out was completed. The target area was identified using fluoroscopy. A 12-in long, straight, sterile hemostat was used with fluoroscopic guidance to locate the targets for each level blocked. Once located, the skin was marked with an approved surgical skin marker. Once all sites were marked, the skin (epidermis, dermis, and hypodermis), as well as deeper tissues (fat, connective tissue and muscle) were infiltrated with a small amount of a short-acting local anesthetic, loaded on a 10cc syringe with a 25G, 1.5-in  Needle. An appropriate amount of time was allowed for local anesthetics to take effect before proceeding to the next step. Local Anesthetic: Lidocaine 2.0% The unused portion of the local anesthetic was discarded in the proper designated containers. Technical explanation of process:    C4 Medial Branch Nerve Block (MBB): The target area for the C4 dorsal medial articular branch is the lateral concave waist of the articular pillar of C4. Under fluoroscopic guidance, a Quincke needle was inserted until contact was made with os over the postero-lateral aspect of the articular pillar of C4 (target area). After negative aspiration for blood, 1 mL of the nerve block solution was injected without difficulty or complication. The needle was removed intact. C5 Medial Branch Nerve Block (MBB): The target area for the C5 dorsal medial articular branch is the lateral concave waist of the articular pillar of  C5. Under fluoroscopic guidance, a Quincke needle was inserted until contact was made with os over the postero-lateral aspect of the articular pillar of C5 (target area). After negative aspiration for blood, 1 mL of the nerve block solution was injected without difficulty or complication. The needle was removed intact. C6 Medial Branch Nerve Block (MBB): The target area for the C6 dorsal medial articular branch is the lateral concave waist of the articular pillar of C6. Under fluoroscopic guidance, a Quincke needle was inserted until contact was made with os over the postero-lateral aspect of the articular pillar of C6 (target area). After negative aspiration for blood, 18mL of the nerve block solution was injected without difficulty or complication. The needle was removed intact.  Procedural Needles: 22-gauge, 3.5-inch, Quincke needles used for all levels. Nerve block solution: 5 cc solution made of 4 cc of 0.2% ropivacaine, 1 cc of Decadron 10 mg/cc.  1 cc injected at each level above on the right.  The unused portion of the solution was discarded in the proper designated containers.  Once the entire procedure was completed, the treated area was cleaned, making sure to leave some of the prepping solution back to take advantage of its long term bactericidal properties.  Anatomy Reference Guide:       Vitals:   11/06/19 0811 11/06/19 0833 11/06/19 0838 11/06/19 0843  BP: (!) 149/97 (!) 161/88 (!) 156/96 (!) 145/86  Pulse: 97     Resp: 16 20 20 20   Temp: (!) 97.3 F (36.3 C)     SpO2: 97% 95% 94% 96%  Weight: 122 lb (55.3 kg)     Height: 5\' 2"  (1.575 m)       Start Time: 0829 hrs. End Time: 0843 hrs.  Imaging Guidance (Spinal):          Type of Imaging Technique: Fluoroscopy Guidance (Spinal) Indication(s): Assistance in needle guidance and placement for procedures requiring needle placement in or near specific anatomical locations not easily accessible without such assistance. Exposure  Time: Please see nurses notes. Contrast: None used. Fluoroscopic Guidance:  I was personally present during the use of fluoroscopy. "Tunnel Vision Technique" used to obtain the best possible view of the target area. Parallax error corrected before commencing the procedure. "Direction-depth-direction" technique used to introduce the needle under continuous pulsed fluoroscopy. Once target was reached, antero-posterior, oblique, and lateral fluoroscopic projection used confirm needle placement in all planes. Images permanently stored in EMR. Interpretation: No contrast injected. I personally interpreted the imaging intraoperatively. Adequate needle placement confirmed in multiple planes. Permanent images saved into the patient's record.   Post-operative Assessment:  Post-procedure Vital Signs:  Pulse/HCG Rate: 97100 (sinus tach) Temp: (!) 97.3 F (36.3 C) Resp: 20 BP: (!) 145/86 SpO2: 96 %  EBL: None  Complications: No immediate post-treatment complications observed by team, or reported by patient.  Note: The patient tolerated the entire procedure well. A repeat set of vitals were taken after the procedure and the patient was kept under observation following institutional policy, for this type of procedure. Post-procedural neurological assessment was performed, showing return to baseline, prior to discharge. The patient was provided with post-procedure discharge instructions, including a section on how to identify potential problems. Should any problems arise concerning this procedure, the patient was given instructions to immediately contact us, at any time, without hesitation. In any case, we plan to contact the patient by telephone for a follow-up status report regarding this interventional procedure.  Comments:  No additional relevant information.  Plan of Care  Orders:  Orders Placed This Encounter  Procedures   DG PAIN CLIN[facility] C-ARM 1-60 MIN NO REPORT    Intraoperative interpretation by  procedural physician at [place].    Standing Status:   Standing    Number of Occurrences:   1    Order Specific Question:   Reason for exam:    Answer:   Assistance in needle guidance and placement for procedures requiring needle placement in or near specific anatomical locations not easily accessible without such assistance.   Medications ordered for procedure: Meds ordered this encounter  Medications   lidocaine (XYLOCAINE) 2 % (with pres) injection 400 mg   ropivacaine (PF) 2 mg/mL (0.2%) (NAROPIN) injection 9 mL   ropivacaine (PF) 2 mg/mL (0.2%) (NAROPIN) injection 9 mL   dexamethasone (DECADRON) injection 10 mg   dexamethasone (DECADRON) injection 20 mg   Medications administered: We administered lidocaine, ropivacaine (PF) 2 mg/mL (0.2%), ropivacaine (PF) 2 mg/mL (0.2%), dexamethasone, and dexamethasone.  See the medical record for exact dosing, route, and time of administration.  Follow-up plan:   Return in about 4 weeks (around 12/04/2019) for Post Procedure Evaluation, virtual.      Right C4, C5, C6 cervical facet medial branch nerve block #1 11/06/2019, bilateral T6 facet block 11/06/2019.   Recent Visits Date Type Provider Dept  10/26/19 Office Visit Gillis Santa, MD [facility]  Showing recent visits within past 90 days and meeting all other requirements Today's Visits Date Type Provider Dept  11/06/19 Procedure visit Gillis Santa, MD [facility]  Showing today's visits and meeting all other requirements Future Appointments Date Type Provider Dept  12/04/19 Appointment Gillis Santa, MD [facility]  Showing future appointments within next 90 days and meeting all other requirements  Disposition: Discharge home  Discharge (Date   Time): 11/06/2019; 0850 hrs.   Primary Care Physician: Kirk Ruths, MD Location: [facility] Note by: Gillis Santa, MD Date: 11/06/2019; Time: 10:45  AM  Disclaimer:  Medicine is not an exact science. The only  guarantee in medicine is that nothing is guaranteed. It is important to note that the decision to proceed with this intervention was based on the information collected from the patient. The Data and conclusions were drawn from the patient's questionnaire, the interview, and the physical examination. Because the information was provided in large part by the patient, it cannot be guaranteed that it has not been purposely or unconsciously manipulated. Every effort has been made to obtain as much relevant data as possible for this evaluation. It is important to note that the conclusions that lead to this procedure are derived in large part from the available data. Always take into account that the treatment will also be dependent on availability of resources and existing treatment guidelines, considered by other Pain Management Practitioners as being common knowledge and practice, at the time of the intervention. For Medico-Legal purposes, it is also important to point out that variation in procedural techniques and pharmacological choices are the acceptable norm. The indications, contraindications, technique, and results of the above procedure should only be interpreted and judged by a Board-Certified Interventional Pain Specialist with extensive familiarity and expertise in the same exact procedure and technique.

## 2019-11-07 ENCOUNTER — Telehealth: Payer: Self-pay

## 2019-11-07 NOTE — Telephone Encounter (Signed)
Post procedure phone call. Patient states she is doing good.  

## 2019-11-09 ENCOUNTER — Inpatient Hospital Stay: Payer: Medicare Other | Attending: Hematology and Oncology

## 2019-11-09 ENCOUNTER — Other Ambulatory Visit: Payer: Self-pay

## 2019-11-09 DIAGNOSIS — D509 Iron deficiency anemia, unspecified: Secondary | ICD-10-CM

## 2019-11-09 LAB — CBC WITH DIFFERENTIAL/PLATELET
Abs Immature Granulocytes: 0.06 10*3/uL (ref 0.00–0.07)
Basophils Absolute: 0 10*3/uL (ref 0.0–0.1)
Basophils Relative: 0 %
Eosinophils Absolute: 0.1 10*3/uL (ref 0.0–0.5)
Eosinophils Relative: 1 %
HCT: 33.9 % — ABNORMAL LOW (ref 36.0–46.0)
Hemoglobin: 10.8 g/dL — ABNORMAL LOW (ref 12.0–15.0)
Immature Granulocytes: 1 %
Lymphocytes Relative: 15 %
Lymphs Abs: 1.7 10*3/uL (ref 0.7–4.0)
MCH: 28.2 pg (ref 26.0–34.0)
MCHC: 31.9 g/dL (ref 30.0–36.0)
MCV: 88.5 fL (ref 80.0–100.0)
Monocytes Absolute: 1 10*3/uL (ref 0.1–1.0)
Monocytes Relative: 9 %
Neutro Abs: 8.7 10*3/uL — ABNORMAL HIGH (ref 1.7–7.7)
Neutrophils Relative %: 74 %
Platelets: 433 10*3/uL — ABNORMAL HIGH (ref 150–400)
RBC: 3.83 MIL/uL — ABNORMAL LOW (ref 3.87–5.11)
RDW: 17.2 % — ABNORMAL HIGH (ref 11.5–15.5)
WBC: 11.5 10*3/uL — ABNORMAL HIGH (ref 4.0–10.5)
nRBC: 0 % (ref 0.0–0.2)

## 2019-11-09 LAB — SEDIMENTATION RATE: Sed Rate: 11 mm/hr (ref 0–30)

## 2019-11-09 LAB — FERRITIN: Ferritin: 37 ng/mL (ref 11–307)

## 2019-12-04 ENCOUNTER — Telehealth: Payer: Medicare Other | Admitting: Student in an Organized Health Care Education/Training Program

## 2019-12-05 ENCOUNTER — Encounter: Payer: Self-pay | Admitting: Student in an Organized Health Care Education/Training Program

## 2019-12-06 ENCOUNTER — Encounter: Payer: Self-pay | Admitting: Student in an Organized Health Care Education/Training Program

## 2019-12-06 ENCOUNTER — Other Ambulatory Visit: Payer: Self-pay

## 2019-12-06 ENCOUNTER — Ambulatory Visit
Payer: Medicare Other | Attending: Student in an Organized Health Care Education/Training Program | Admitting: Student in an Organized Health Care Education/Training Program

## 2019-12-06 DIAGNOSIS — M5124 Other intervertebral disc displacement, thoracic region: Secondary | ICD-10-CM | POA: Diagnosis not present

## 2019-12-06 DIAGNOSIS — S22000A Wedge compression fracture of unspecified thoracic vertebra, initial encounter for closed fracture: Secondary | ICD-10-CM | POA: Diagnosis not present

## 2019-12-06 DIAGNOSIS — M47812 Spondylosis without myelopathy or radiculopathy, cervical region: Secondary | ICD-10-CM

## 2019-12-06 DIAGNOSIS — M47814 Spondylosis without myelopathy or radiculopathy, thoracic region: Secondary | ICD-10-CM | POA: Diagnosis not present

## 2019-12-06 DIAGNOSIS — G894 Chronic pain syndrome: Secondary | ICD-10-CM

## 2019-12-06 MED ORDER — ONDANSETRON 4 MG PO TBDP: 4.0000 mg | ORAL_TABLET | Freq: Three times a day (TID) | ORAL | 0 refills | Status: DC | PRN

## 2019-12-06 NOTE — Progress Notes (Signed)
Patient: Ashley Mack  Service Category: E/M  Provider: Gillis Santa, MD  DOB: 08-20-1945  DOS: 12/06/2019  Location: Office  MRN: [id number]  Setting: Ambulatory outpatient  Referring Provider: Kirk Ruths, MD  Type: Established Patient  Specialty: Interventional Pain Management  PCP: Kirk Ruths, MD  Location: Home  Delivery: TeleHealth     Virtual Encounter - Pain Management PROVIDER NOTE: Information contained herein reflects review and annotations entered in association with encounter. Interpretation of such information and data should be left to medically-trained personnel. Information provided to patient can be located elsewhere in the medical record under "Patient Instructions". Document created using STT-dictation technology, any transcriptional errors that may result from process are unintentional.    Contact & Pharmacy Preferred: [phone] Home: [phone] (home) Mobile: [phone] (mobile) E-mail: No e-mail address on record  [facility] ([facility] [place] [address]) [place] [address] Phone: [phone] Fax: [phone]   Pre-screening  Ms. Ashley Mack offered "in-person" vs "virtual" encounter. She indicated preferring virtual for this encounter.   Reason COVID-19*  Social distancing based on CDC and AMA recommendations.   I contacted Ashley Mack on 12/06/2019 via telephone.      I clearly identified myself as Gillis Santa, MD. I verified that I was speaking with the correct person using two identifiers (Name: Ashley Mack, and date of birth: 11/30/1945).  Consent I sought verbal advanced consent from Ashley Mack for virtual visit interactions. I informed Ashley Mack of possible security and privacy concerns, risks, and limitations associated with providing "not-in-person" medical evaluation and management services. I also informed Ashley Mack of the availability of "in-person"  appointments. Finally, I informed her that there would be a charge for the virtual visit and that she could be  personally, fully or partially, financially responsible for it. Ashley Mack expressed understanding and agreed to proceed.   Historic Elements   Ms. Ashley Mack is a 74 y.o. year old, female patient evaluated today after our last contact on 11/06/2019. 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 She also  has a past surgical history that includes Oophorectomy (Bilateral, 2003); Cataract extraction w/PHACO (Right, 08/12/2016); Cataract extraction w/PHACO (Left, 09/30/2016); Colonoscopy; Esophagogastroduodenoscopy; Colonoscopy with propofol (N/A, 12/09/2016); Esophagogastroduodenoscopy (egd) with propofol (N/A, 12/09/2016); Kyphoplasty (N/A, 08/10/2017); Kyphoplasty (N/A, 09/09/2017); Kyphoplasty (N/A, 09/28/2017); Kyphoplasty (N/A, 06/19/2019); and RIGHT/LEFT HEART CATH AND CORONARY ANGIOGRAPHY (Bilateral, 10/19/2019). Ashley Mack has a current medication list which includes the following prescription(s): vitamin d3, cyanocobalamin, dexilant, integra, ondansetron, prednisone, simply saline, and albuterol. She  reports that she quit smoking about 12 years ago. Her smoking use included cigarettes. She smoked 1.00 pack per day. She has never used smokeless tobacco. She reports that she does not drink alcohol and does not use drugs. Ashley Mack is allergic to methotrexate derivatives, other, ranitidine, sulfasalazine, and topiramate.   HPI  Today, she is being contacted for a post-procedure assessment.    Post-Procedure Evaluation  Procedure (11/06/2019):   Type: Cervical Facet Medial Branch Block(s)  #1  Primary Purpose: Diagnostic Region: Posterolateral cervical spine Level: C4, C5, C6, Medial Branch Level(s). Injecting these levels blocks the  C4-5, C5-6,cervical  facet joints. Laterality: Right  Type: Diagnostic Medial Branch Facet Block  #1  Region:Thoracic Level: T6 Medial Branch Level(s) Laterality: Bilateral  Sedation: Please see nurses note.  Effectiveness during initial hour after procedure(Ultra-Short Term Relief): 50 %   Local anesthetic used: Long-acting (4-6 hours) Effectiveness: Defined as any analgesic benefit obtained secondary to the administration of local anesthetics. This carries significant diagnostic value as to the etiological location, or anatomical origin, of the pain. Duration of benefit is expected to coincide with the duration of the local anesthetic used.  Effectiveness during initial 4-6 hours after procedure(Short-Term Relief): 50 %   Long-term benefit: Defined as any relief past the pharmacologic duration of the local anesthetics.  Effectiveness past the initial 6 hours after procedure(Long-Term Relief): 0 %   Current benefits: Defined as benefit that persist at this time.   Analgesia:  No benefit Function: No benefit ROM: No benefit  Laboratory Chemistry Profile   Renal Lab Results  Component Value Date   BUN 9 09/16/2019   CREATININE 0.85 09/16/2019   GFRAA >60 09/16/2019   GFRNONAA >60 09/16/2019     Hepatic Lab Results  Component Value Date   AST 23 09/16/2019   ALT 22 09/16/2019   ALBUMIN 3.7 09/16/2019   ALKPHOS 67 09/16/2019   LIPASE 25 06/16/2019     Electrolytes Lab Results  Component Value Date   NA 136 09/16/2019   K 3.1 (L) 09/16/2019   CL 96 (L) 09/16/2019   CALCIUM 8.6 (L) 09/16/2019   MG 2.4 06/19/2019     Bone No results found for: VD25OH, [id number], CW2376EG3, TD1761YW7, 25OHVITD1, 25OHVITD2, 25OHVITD3,  TESTOFREE, TESTOSTERONE   Inflammation (CRP: Acute Phase) (ESR: Chronic Phase) Lab Results  Component Value Date   ESRSEDRATE 11 11/09/2019       Note: Above Lab results reviewed.   Assessment  The primary encounter diagnosis was Cervical facet joint syndrome. Diagnoses of Arthropathy of thoracic facet joint, Thoracic disc herniation, Compression fracture of body of thoracic vertebra ([place]), and Chronic pain syndrome were also pertinent to this visit.  Plan of Care   Ms. Ashley Mack has a current medication list which includes the following long-term medication(s): dexilant and simply saline.   Galia follows up today virtually for postprocedural evaluation after right C4, C5, C6 cervical facet medial branch nerve block as well as bilateral T6 facet block.  Unfortunately these were not effective for her pain, even short-term.  For this reason I would not recommend a second diagnostic medial branch nerve block nor a radiofrequency ablation at this level.  Of note, patient has a significant history of compression fractures at multiple regions in her thoracic and lumbar spine with kyphoplasty's done at these regions.  At this point, I have limited options for the patient.  I did offer her medication management but she states that she has tried various NSAIDs, muscle relaxers as well as opioid medications and is not interested in these as they make her sedated and she does not like how they make her feel especially since they do not provide effective analgesic benefit.  Patient is requesting a prescription for Zofran which I will prescribe.  Follow-up plan:   Return if symptoms worsen or fail to improve.     Right C4, C5, C6 cervical facet medial branch nerve block #1 11/06/2019, bilateral T6 facet block 11/06/2019 not helpful.    Recent Visits Date Type Provider Dept  11/06/19 Procedure visit Gillis Santa, [facility]  10/26/19 Office  Visit Gillis Santa, MD [facility]   Showing recent visits within past 90 days and meeting all other requirements Today's Visits Date Type Provider Dept  12/06/19 Telemedicine Gillis Santa, MD [facility]  Showing today's visits and meeting all other requirements Future Appointments No visits were found meeting these conditions. Showing future appointments within next 90 days and meeting all other requirements  I discussed the assessment and treatment plan with the patient. The patient was provided an opportunity to ask questions and all were answered. The patient agreed with the plan and demonstrated an understanding of the instructions.  Patient advised to call back or seek an in-person evaluation if the symptoms or condition worsens.  Duration of encounter: 20 minutes.  Note by: Gillis Santa, MD Date: 12/06/2019; Time: 3:44 PM

## 2020-02-13 ENCOUNTER — Inpatient Hospital Stay: Payer: Medicare Other | Attending: Hematology and Oncology

## 2020-02-13 ENCOUNTER — Other Ambulatory Visit: Payer: Self-pay

## 2020-02-13 DIAGNOSIS — D509 Iron deficiency anemia, unspecified: Secondary | ICD-10-CM | POA: Insufficient documentation

## 2020-02-13 LAB — CBC WITH DIFFERENTIAL/PLATELET
Abs Immature Granulocytes: 0.1 10*3/uL — ABNORMAL HIGH (ref 0.00–0.07)
Basophils Absolute: 0.1 10*3/uL (ref 0.0–0.1)
Basophils Relative: 0 %
Eosinophils Absolute: 0 10*3/uL (ref 0.0–0.5)
Eosinophils Relative: 0 %
HCT: 34.3 % — ABNORMAL LOW (ref 36.0–46.0)
Hemoglobin: 10.9 g/dL — ABNORMAL LOW (ref 12.0–15.0)
Immature Granulocytes: 1 %
Lymphocytes Relative: 7 %
Lymphs Abs: 0.9 10*3/uL (ref 0.7–4.0)
MCH: 27.5 pg (ref 26.0–34.0)
MCHC: 31.8 g/dL (ref 30.0–36.0)
MCV: 86.6 fL (ref 80.0–100.0)
Monocytes Absolute: 0.3 10*3/uL (ref 0.1–1.0)
Monocytes Relative: 2 %
Neutro Abs: 12.7 10*3/uL — ABNORMAL HIGH (ref 1.7–7.7)
Neutrophils Relative %: 90 %
Platelets: 525 10*3/uL — ABNORMAL HIGH (ref 150–400)
RBC: 3.96 MIL/uL (ref 3.87–5.11)
RDW: 14.7 % (ref 11.5–15.5)
WBC: 14.1 10*3/uL — ABNORMAL HIGH (ref 4.0–10.5)
nRBC: 0 % (ref 0.0–0.2)

## 2020-02-13 LAB — FERRITIN: Ferritin: 11 ng/mL (ref 11–307)

## 2020-02-14 ENCOUNTER — Inpatient Hospital Stay: Payer: Medicare Other

## 2020-02-14 ENCOUNTER — Other Ambulatory Visit: Payer: Medicare Other

## 2020-02-14 NOTE — Progress Notes (Signed)
[facility]  [address] [place], [place] [address] Phone: [phone]  Fax: [phone]   Clinic Day:  02/15/2020  Referring physician: Kirk Ruths, MD  Chief Complaint: Ashley Mack is a 74 y.o. female with iron deficiency anemia and B12 deficiency who is seen for 6 month assessment.  HPI: The patient was last seen in the hematology clinic on 08/03/2019 via telemedicine. At that time, she was doing "ok". She could tell her blood counts were better. She had chronic shortness of breath.  Exam was stable. Hematocrit was 34.9, hemoglobin 11.2, MCV 88.1, platelets 415,000, WBC 11,500 (ANC 8,700). Ferritin was 168.  The patient underwent bilateral heart catheterization and coronary angiography on 10/19/2019 by Dr. Clayborn Bigness. LV end-diastolic pressure was normal.  LV function was normal.  There was no significant pulmonary hypertension.  Coronary arteries were normal.  The patient saw Dr. Raul Del on 12/14/2019.  She was noted to have chronic dyspnea and limited by drug tolerances.  She was to stay on albuterol.  Swallowing test with speech therapy was postponed per her request.  She is a follow-up in 10 weeks.  Labs followed: 11/09/2019: Hematocrit 33.9, hemoglobin 10.8, MCV 88.5, platelets 433,000, WBC 11,500 (ANC   8,700). Ferritin 37. Sed rate 11. 02/13/2020: Hematocrit 34.3, hemoglobin 10.9, MCV 86.6, platelets 525,000, WBC 14,100 (ANC 12,700). Ferritin 11.  During the interim, she has felt "a lot better." She does not feel like she needs iron today. She has had shortness of breath on exertion since she had COVID-19 in 04/2019. She has back pain and sees a Restaurant manager, fast food. Her muscles aches have resolved.   Past Medical History:  Diagnosis Date  . Anemia   . Anxiety   . Arthritis    Rheumatoid  . Atony of gallbladder   . 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 Centrilobular emphysema ([place])   . Dyspnea   . Fatty liver   . GERD  (gastroesophageal reflux disease)   . History of fractured vertebra    sees chiropractor  . 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 Iron (Fe) deficiency anemia   . Migraine headache    in past  . Migraines   . Motion sickness   . 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 Osteoporosis   . Rheumatoid arthritis ([place])   . Vertigo     Past Surgical History:  Procedure Laterality Date  . CATARACT EXTRACTION W/PHACO Right 08/12/2016   Procedure: CATARACT EXTRACTION PHACO AND INTRAOCULAR LENS PLACEMENT ([place])  Right;  Surgeon: Leandrew Koyanagi, MD;  Location: [facility];  Service: Ophthalmology;  Laterality: Right;  . CATARACT EXTRACTION W/PHACO Left 09/30/2016   Procedure: CATARACT EXTRACTION PHACO AND INTRAOCULAR LENS PLACEMENT ([place]) Left;  Surgeon: Leandrew Koyanagi, MD;  Location: [facility];  Service: Ophthalmology;  Laterality: Left;  . COLONOSCOPY    . COLONOSCOPY WITH PROPOFOL N/A 12/09/2016   Procedure: COLONOSCOPY WITH PROPOFOL;  Surgeon: Toledo, Benay Pike, MD;  Location: [facility];  Service: Endoscopy;  Laterality: N/A;  . ESOPHAGOGASTRODUODENOSCOPY    . ESOPHAGOGASTRODUODENOSCOPY (EGD) WITH PROPOFOL N/A 12/09/2016   Procedure: ESOPHAGOGASTRODUODENOSCOPY (EGD) WITH PROPOFOL;  Surgeon: Toledo, Benay Pike, MD;  Location: [facility];  Service: Endoscopy;  Laterality: N/A;  . KYPHOPLASTY N/A 08/10/2017   Procedure: Hewitt Shorts;  Surgeon: Hessie Knows, MD;  Location: [facility];  Service: Orthopedics;  Laterality: N/A;  . KYPHOPLASTY N/A 09/09/2017   Procedure: [id number],X2;  Surgeon: Hessie Knows, MD;  Location: [facility];  Service: Orthopedics;  Laterality: N/A;  . KYPHOPLASTY N/A 09/28/2017   Procedure: [id number];  Surgeon: Hessie Knows, MD;  Location: [facility];  Service: Orthopedics;  Laterality: N/A;  . KYPHOPLASTY N/A 06/19/2019   Procedure: KYPHOPLASTY T5;  Surgeon: Hessie Knows, MD;  Location: [facility];  Service: Orthopedics;  Laterality:  N/A;  . OOPHORECTOMY Bilateral 2003  . RIGHT/LEFT HEART CATH AND CORONARY ANGIOGRAPHY Bilateral 10/19/2019   Procedure: RIGHT/LEFT HEART CATH AND CORONARY ANGIOGRAPHY;  Surgeon: Yolonda Kida, MD;  Location: [facility];  Service: Cardiovascular;  Laterality: Bilateral;    Family History  Problem Relation Age of Onset  . Kidney disease Mother   . Diabetes Mellitus II Brother     Social History:  reports that she quit smoking about 12 years ago. Her smoking use included cigarettes. She smoked 1.00 pack per day. She has never used smokeless tobacco. She reports that she does not drink alcohol and does not use drugs. She quit smoking in 2009. She lives in [place].Her step Tax adviser. The patient is accompanied by [employer] today.  Allergies:  Allergies  Allergen Reactions  . Methotrexate Derivatives Nausea And Vomiting and Other (See Comments)    Flu like symptoms  . Other Nausea Only and Other (See Comments)    Arthritis medications . Unsure of what the med was.  . Ranitidine Hives  . Sulfasalazine Other (See Comments)    Stomach upset  . Topiramate Nausea Only    Current Medications: Current Outpatient Medications  Medication Sig Dispense Refill  . albuterol (VENTOLIN HFA) 108 (90 Base) MCG/ACT inhaler Inhale 2 puffs into the lungs every 6 (six) hours as needed for wheezing or shortness of breath.     . budesonide-formoterol (SYMBICORT) 160-4.5 MCG/ACT inhaler Inhale into the lungs.    . Cholecalciferol (VITAMIN D3) 2000 UNITS capsule Take 2,000 Units by mouth daily.     . cyanocobalamin (,VITAMIN B-12,) 1000 MCG/ML injection INJECT 1ML INTO THE MUSCLE EVERY 30 DAYS    . DEXILANT 60 MG capsule Take 60 mg by mouth daily.     . Fe Fum-FePoly-Vit C-Vit B3 (INTEGRA) 62.5-62.5-40-3 MG CAPS Take 1 capsule by mouth daily.    . Ginger, Zingiber officinalis, (GINGER PO) Take by mouth.    . ondansetron (ZOFRAN-ODT) 4 MG disintegrating tablet Take 1 tablet (4 mg total) by  mouth every 8 (eight) hours as needed for nausea or vomiting. 30 tablet 0  . predniSONE (DELTASONE) 5 MG tablet Take 5 mg by mouth daily.     . Saline (SIMPLY SALINE) 0.9 % AERS Place 1 spray into the nose as needed (congestion).      No current facility-administered medications for this visit.    Review of Systems  Constitutional: Positive for weight loss (1 lb since 10/2019). Negative for chills, diaphoresis, fever and malaise/fatigue.       Feels "a lot better."  HENT: Negative for congestion, ear discharge, ear pain, hearing loss, nosebleeds, sinus pain, sore throat and tinnitus.   Eyes: Negative for blurred vision.       Poor vision.  Respiratory: Positive for shortness of breath (on exertion). Negative for cough, hemoptysis and sputum production.   Cardiovascular: Negative for chest pain, palpitations and leg swelling.  Gastrointestinal: Negative for abdominal pain, blood in stool, constipation, diarrhea, heartburn, melena, nausea and vomiting.  Genitourinary: Negative for dysuria, frequency, hematuria and urgency.  Musculoskeletal: Positive for back pain. Negative for joint pain, myalgias and neck pain.  Patient s/p 5 kyphoplasties since 04/2017.  Skin: Negative for itching and rash.       Fragile skin.  Neurological: Negative for dizziness, tingling, sensory change, weakness and headaches.  Endo/Heme/Allergies: Does not bruise/bleed easily.  Psychiatric/Behavioral: Negative for depression and memory loss. The patient is not nervous/anxious and does not have insomnia.   All other systems reviewed and are negative.   Performance status (ECOG): 1  Vitals Blood pressure (!) 158/73, pulse 92, temperature (!) 97.5 F (36.4 C), temperature source Tympanic, resp. rate 18, weight 123 lb (55.8 kg), SpO2 99 %.   Physical Exam Vitals and nursing note reviewed.  Constitutional:      General: She is not in acute distress.    Appearance: She is well-developed. She is not  diaphoretic.  HENT:     Head: Normocephalic and atraumatic.     Mouth/Throat:     Pharynx: No oropharyngeal exudate.  Eyes:     General: No scleral icterus.    Conjunctiva/sclera: Conjunctivae normal.     Pupils: Pupils are equal, round, and reactive to light.     Comments: Glasses. Blue eyes.  Cardiovascular:     Rate and Rhythm: Normal rate and regular rhythm.     Heart sounds: Normal heart sounds. No murmur heard.   Pulmonary:     Effort: Pulmonary effort is normal. No respiratory distress.     Breath sounds: No wheezing or rales.  Chest:     Chest wall: No tenderness.  Abdominal:     General: Bowel sounds are normal. There is no distension.     Palpations: Abdomen is soft. There is no mass.     Tenderness: There is no abdominal tenderness. There is no guarding or rebound.  Musculoskeletal:        General: No tenderness. Normal range of motion.     Cervical back: Normal range of motion and neck supple.  Lymphadenopathy:     Cervical: No cervical adenopathy.     Upper Body:     Right upper body: No supraclavicular adenopathy.     Left upper body: No supraclavicular adenopathy.  Skin:    General: Skin is warm and dry.     Comments: Fragile skin.  Neurological:     Mental Status: She is alert and oriented to person, place, and time.  Psychiatric:        Behavior: Behavior normal.        Thought Content: Thought content normal.        Judgment: Judgment normal.    Appointment on 02/13/2020  Component Date Value Ref Range Status  . Ferritin 02/13/2020 11  11 - 307 ng/mL Final   Performed at [facility], [address] [place], [place] [address]  . WBC 02/13/2020 14.1* 4.0 - 10.5 K/uL Final  . RBC 02/13/2020 3.96  3.87 - 5.11 MIL/uL Final  . Hemoglobin 02/13/2020 10.9* 12.0 - 15.0 g/dL Final  . HCT 02/13/2020 34.3* 36 - 46 % Final  . MCV 02/13/2020 86.6  80.0 - 100.0 fL Final  . MCH 02/13/2020 27.5  26.0 - 34.0 pg Final  . MCHC 02/13/2020 31.8  30.0 - 36.0  g/dL Final  . RDW 02/13/2020 14.7  11.5 - 15.5 % Final  . Platelets 02/13/2020 525* 150 - 400 K/uL Final  . nRBC 02/13/2020 0.0  0.0 - 0.2 % Final  . Neutrophils Relative % 02/13/2020 90  % Final  . Neutro Abs 02/13/2020 12.7* 1.7 - 7.7 K/uL Final  .  Lymphocytes Relative 02/13/2020 7  % Final  . Lymphs Abs 02/13/2020 0.9  0.7 - 4.0 K/uL Final  . Monocytes Relative 02/13/2020 2  % Final  . Monocytes Absolute 02/13/2020 0.3  0.1 - 1.0 K/uL Final  . Eosinophils Relative 02/13/2020 0  % Final  . Eosinophils Absolute 02/13/2020 0.0  0.0 - 0.5 K/uL Final  . Basophils Relative 02/13/2020 0  % Final  . Basophils Absolute 02/13/2020 0.1  0.0 - 0.1 K/uL Final  . Immature Granulocytes 02/13/2020 1  % Final  . Abs Immature Granulocytes 02/13/2020 0.10* 0.00 - 0.07 K/uL Final   Performed at [facility], [address], [place], [place] [address]    Assessment:  Ashley Mack is a 74 y.o. female with iron deficiency anemiaand B12 deficiency. Dietis good. She was on oral iron(Integra) for years. She is unsure of any melena or hematochezia as she can not see well.   Work-up on 06/18/2018revealed a hematocrit 30.6, hemoglobin 9.9, and MCV 83.2. B12 was 90. Ferritin was 13. Sed rate was 45 (elevated). Retic was 2.6%. Normal studiesincluded the following: [place], folate, iron saturation, TIBC, SPEP, and free light chain ratio. Normal studieson 07/29/2016 included: creatinine, calcium, albumin, protein, liver function tests, and TSH.  She had acolonoscopyon 03/18/2009. She has had several EGDswith the last on 10/12/2008. She states that she has had polyps, reflux and a hiatal hernia. EGD on 12/09/2016 was normal, other than demonstrating a small hiatal hernia. Colonoscopyon 12/09/2016 demonstrated friability with contact bleeding in the proximal transverse colon. A 1 mm sessile polyp was found in the sigmoid colon, in addition to grade II non-bleeding internal hemorrhoids  were also noted. Pathology results demonstrated a tubular adenoma that was negative for dyplasia or malignancy. 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   She began weekly B12on 09/08/2016 and continued x 6 weeks (last 10/16/2016). She began monthly B12on 11/26/2016 (last 02/17/2018).Folatewas 9.5 on 03/01/2019.  She received Venoferon 09/08/2016, 09/15/2016, 09/25/2016, 11/26/2016, 12/16/2016, 12/24/2016, x3 (05/27/2017 - 06/10/2017), x2 (03/03/2018 - 03/10/2018), x3 (06/22/2018 - 07/06/2018), x 2 (08/31/2018 - 09/07/2018), x 4 (03/02/2019 - 03/23/2019), and x 3 (07/05/2019 - 07/20/2019).She receives Venofer if her ferritin is <30.  Ferritinhas been followed: 16 on 11/26/2016, 34 on 03/01/2017, 15 on 05/26/2017, 59 on 07/08/2017, 56 on 08/25/2017, 15 on 02/28/2018, 17 on 06/21/2018, 35on 08/30/2018, 32 on 12/01/2018, 20 on 03/01/2019, 60 on 06/07/2019, 26 on 06/29/2019, 168 on 08/02/2019, 37 on 11/09/2019, and 11 on 02/13/2020.   She has rheumatoid arthritis. She has been treated with methotrexate, Imuran, leflunomide, and sulfasalazine. She is on prednisone 5 mg a day x 2 years. She is s/p kyphoplasty x 5 since 04/2017.  Patient tested positive for COVID-19 on 04/28/2019.  Symptomatically,  she has felt "a lot better."  She has had shortness of breath on exertion since she had COVID-19 in 04/2019.  Exam is stable.  Plan: 1.   Review labs from 02/13/2020. 2. Iron deficiency Hematocrit 34.3. Hemoglobin 10.9. MCV  86.6.   Ferritin  11.  Ferritin goal is 100. Review plans for reinitiation of Venofer as ferritin < 30.  Patient wishes to defer today.  RTC weekly x3 for Venofer. 3. B12 deficiency She receives B12 around the first of thismonth/last week. Continue B12.  Monitor B12 and folate annually. 4.   No Venofer today (going to [facility]). 5.   RTC weekly x 3 for Venofer. 6.   RTC in 3 months for labs  (CBC with diff, ferritin). 7.  RTC in 6 months for MD assessment, labs (CBC with diff, ferritin, iron studies, B12, folate-day before) and+/-Venofer.  I discussed the assessment and treatment plan with the patient.  The patient was provided an opportunity to ask questions and all were answered.  The patient agreed with the plan and demonstrated an understanding of the instructions.  The patient was advised to call back if the symptoms worsen or if the condition fails to improve as anticipated.   Lequita Asal, MD, PhD    02/15/2020, 11:27 AM  I, Mirian Mo Tufford, am acting as Education administrator for [employer]. Mike Gip, MD, PhD.  I, Huntley Knoop C. Mike Gip, MD, have reviewed the above documentation for accuracy and completeness, and I agree with the above.

## 2020-02-15 ENCOUNTER — Inpatient Hospital Stay: Payer: Medicare Other | Attending: Hematology and Oncology | Admitting: Hematology and Oncology

## 2020-02-15 ENCOUNTER — Other Ambulatory Visit: Payer: Self-pay

## 2020-02-15 ENCOUNTER — Inpatient Hospital Stay: Payer: Medicare Other

## 2020-02-15 ENCOUNTER — Encounter: Payer: Self-pay | Admitting: Hematology and Oncology

## 2020-02-15 VITALS — BP 158/73 | HR 92 | Temp 97.5°F | Resp 18 | Wt 123.0 lb

## 2020-02-15 DIAGNOSIS — D509 Iron deficiency anemia, unspecified: Secondary | ICD-10-CM | POA: Insufficient documentation

## 2020-02-15 DIAGNOSIS — E538 Deficiency of other specified B group vitamins: Secondary | ICD-10-CM | POA: Diagnosis not present

## 2020-02-21 ENCOUNTER — Inpatient Hospital Stay: Payer: Medicare Other

## 2020-02-21 ENCOUNTER — Other Ambulatory Visit: Payer: Self-pay

## 2020-02-21 VITALS — BP 144/85 | HR 105 | Temp 97.6°F

## 2020-02-21 DIAGNOSIS — D509 Iron deficiency anemia, unspecified: Secondary | ICD-10-CM | POA: Diagnosis not present

## 2020-02-21 DIAGNOSIS — E538 Deficiency of other specified B group vitamins: Secondary | ICD-10-CM

## 2020-02-21 MED ORDER — SODIUM CHLORIDE 0.9 % IV SOLN
INTRAVENOUS | Status: DC
  Filled 2020-02-21: qty 250

## 2020-02-21 MED ORDER — IRON SUCROSE 20 MG/ML IV SOLN
INTRAVENOUS | Status: AC
  Filled 2020-02-21: qty 10

## 2020-02-21 MED ORDER — SODIUM CHLORIDE 0.9 % IV SOLN: 200.0000 mg | Freq: Once | INTRAVENOUS | Status: DC

## 2020-02-21 MED ORDER — IRON SUCROSE 20 MG/ML IV SOLN
200.0000 mg | Freq: Once | INTRAVENOUS | Status: AC
  Administered 2020-02-21: 200 mg via INTRAVENOUS

## 2020-02-21 NOTE — Progress Notes (Signed)
Pt received prescribed treatment in clinic, pt stable at d/c. 

## 2020-02-28 ENCOUNTER — Other Ambulatory Visit: Payer: Self-pay

## 2020-02-28 ENCOUNTER — Inpatient Hospital Stay: Payer: Medicare Other

## 2020-02-28 VITALS — BP 150/81 | HR 86 | Temp 96.0°F | Resp 18

## 2020-02-28 DIAGNOSIS — E538 Deficiency of other specified B group vitamins: Secondary | ICD-10-CM

## 2020-02-28 DIAGNOSIS — D509 Iron deficiency anemia, unspecified: Secondary | ICD-10-CM | POA: Diagnosis not present

## 2020-02-28 MED ORDER — SODIUM CHLORIDE 0.9 % IV SOLN
INTRAVENOUS | Status: DC
  Filled 2020-02-28: qty 250

## 2020-02-28 MED ORDER — SODIUM CHLORIDE 0.9 % IV SOLN: 200.0000 mg | Freq: Once | INTRAVENOUS | Status: DC

## 2020-02-28 MED ORDER — IRON SUCROSE 20 MG/ML IV SOLN
200.0000 mg | Freq: Once | INTRAVENOUS | Status: AC
  Administered 2020-02-28: 11:00:00 200 mg via INTRAVENOUS
  Filled 2020-02-28: qty 10

## 2020-02-28 NOTE — Progress Notes (Signed)
Pt received prescribed treatment in clinic, pt stable at d/c. 

## 2020-03-06 ENCOUNTER — Other Ambulatory Visit: Payer: Self-pay

## 2020-03-06 ENCOUNTER — Inpatient Hospital Stay: Payer: Medicare Other

## 2020-03-06 VITALS — BP 151/82 | HR 101 | Temp 97.7°F

## 2020-03-06 DIAGNOSIS — E538 Deficiency of other specified B group vitamins: Secondary | ICD-10-CM

## 2020-03-06 DIAGNOSIS — D509 Iron deficiency anemia, unspecified: Secondary | ICD-10-CM

## 2020-03-06 MED ORDER — IRON SUCROSE 20 MG/ML IV SOLN
200.0000 mg | Freq: Once | INTRAVENOUS | Status: AC
  Administered 2020-03-06: 11:00:00 200 mg via INTRAVENOUS
  Filled 2020-03-06: qty 10

## 2020-03-06 MED ORDER — SODIUM CHLORIDE 0.9 % IV SOLN: 200.0000 mg | Freq: Once | INTRAVENOUS | Status: DC

## 2020-03-06 MED ORDER — SODIUM CHLORIDE 0.9 % IV SOLN
Freq: Once | INTRAVENOUS | Status: AC
  Filled 2020-03-06: qty 250

## 2020-03-06 NOTE — Progress Notes (Signed)
Patient received prescribed treatment in clinic. Tolerated well. Patient stable at discharge. 

## 2020-05-09 ENCOUNTER — Other Ambulatory Visit: Payer: Self-pay

## 2020-05-14 ENCOUNTER — Other Ambulatory Visit: Payer: Self-pay | Admitting: Hematology and Oncology

## 2020-05-16 ENCOUNTER — Inpatient Hospital Stay: Payer: Medicare Other | Attending: Hematology and Oncology

## 2020-05-16 ENCOUNTER — Other Ambulatory Visit: Payer: Self-pay

## 2020-05-16 DIAGNOSIS — E538 Deficiency of other specified B group vitamins: Secondary | ICD-10-CM

## 2020-05-16 DIAGNOSIS — D509 Iron deficiency anemia, unspecified: Secondary | ICD-10-CM | POA: Diagnosis not present

## 2020-05-16 LAB — CBC WITH DIFFERENTIAL/PLATELET
Abs Immature Granulocytes: 0.1 K/uL — ABNORMAL HIGH (ref 0.00–0.07)
Basophils Absolute: 0.1 K/uL (ref 0.0–0.1)
Basophils Relative: 0 %
Eosinophils Absolute: 0 K/uL (ref 0.0–0.5)
Eosinophils Relative: 0 %
HCT: 35 % — ABNORMAL LOW (ref 36.0–46.0)
Hemoglobin: 11.4 g/dL — ABNORMAL LOW (ref 12.0–15.0)
Immature Granulocytes: 1 %
Lymphocytes Relative: 7 %
Lymphs Abs: 1 K/uL (ref 0.7–4.0)
MCH: 27.9 pg (ref 26.0–34.0)
MCHC: 32.6 g/dL (ref 30.0–36.0)
MCV: 85.6 fL (ref 80.0–100.0)
Monocytes Absolute: 0.4 K/uL (ref 0.1–1.0)
Monocytes Relative: 3 %
Neutro Abs: 11.5 K/uL — ABNORMAL HIGH (ref 1.7–7.7)
Neutrophils Relative %: 89 %
Platelets: 431 K/uL — ABNORMAL HIGH (ref 150–400)
RBC: 4.09 MIL/uL (ref 3.87–5.11)
RDW: 16.2 % — ABNORMAL HIGH (ref 11.5–15.5)
WBC: 13.1 K/uL — ABNORMAL HIGH (ref 4.0–10.5)
nRBC: 0 % (ref 0.0–0.2)

## 2020-05-16 LAB — FERRITIN: Ferritin: 28 ng/mL (ref 11–307)

## 2020-05-21 ENCOUNTER — Telehealth: Payer: Self-pay

## 2020-05-21 NOTE — Telephone Encounter (Signed)
-----   Message from Lequita Asal, MD sent at 05/21/2020  9:52 AM EST ----- Regarding: Please call patient  Hemoglobin and ferritin have improved since last IV iron.  Ferritin is 28 (< 30).  Consider Venofer weekly x 2.  M  ----- Message ----- From: Buel Ream, Lab In [place] Sent: 05/16/2020  10:08 AM EST To: Lequita Asal, MD

## 2020-05-22 NOTE — Telephone Encounter (Signed)
05/22/2020 Appts for weekly venofer x2 made. Pt's daughter Ashley Mack contacted w/ appt details  SRW

## 2020-05-23 ENCOUNTER — Other Ambulatory Visit: Payer: Self-pay

## 2020-05-23 ENCOUNTER — Inpatient Hospital Stay: Payer: Medicare Other

## 2020-05-23 VITALS — BP 156/76 | HR 99 | Temp 97.8°F | Resp 18

## 2020-05-23 DIAGNOSIS — E538 Deficiency of other specified B group vitamins: Secondary | ICD-10-CM

## 2020-05-23 DIAGNOSIS — D509 Iron deficiency anemia, unspecified: Secondary | ICD-10-CM | POA: Diagnosis not present

## 2020-05-23 MED ORDER — SODIUM CHLORIDE 0.9 % IV SOLN
Freq: Once | INTRAVENOUS | Status: AC
  Filled 2020-05-23: qty 250

## 2020-05-23 MED ORDER — IRON SUCROSE 20 MG/ML IV SOLN
200.0000 mg | Freq: Once | INTRAVENOUS | Status: AC
  Administered 2020-05-23: 200 mg via INTRAVENOUS
  Filled 2020-05-23: qty 10

## 2020-05-23 MED ORDER — SODIUM CHLORIDE 0.9 % IV SOLN: 200.0000 mg | Freq: Once | INTRAVENOUS | Status: DC

## 2020-05-29 ENCOUNTER — Other Ambulatory Visit: Payer: Self-pay

## 2020-05-29 ENCOUNTER — Inpatient Hospital Stay: Payer: Medicare Other

## 2020-05-29 VITALS — BP 155/79 | HR 98 | Temp 97.9°F | Resp 18

## 2020-05-29 DIAGNOSIS — E538 Deficiency of other specified B group vitamins: Secondary | ICD-10-CM

## 2020-05-29 DIAGNOSIS — D509 Iron deficiency anemia, unspecified: Secondary | ICD-10-CM | POA: Diagnosis not present

## 2020-05-29 MED ORDER — SODIUM CHLORIDE 0.9 % IV SOLN
200.0000 mg | Freq: Once | INTRAVENOUS | Status: DC
  Filled 2020-05-29: qty 10

## 2020-05-29 MED ORDER — IRON SUCROSE 20 MG/ML IV SOLN
200.0000 mg | Freq: Once | INTRAVENOUS | Status: AC
  Administered 2020-05-29: 200 mg via INTRAVENOUS
  Filled 2020-05-29: qty 10

## 2020-05-29 MED ORDER — SODIUM CHLORIDE 0.9 % IV SOLN
Freq: Once | INTRAVENOUS | Status: AC
  Filled 2020-05-29: qty 250

## 2020-08-14 ENCOUNTER — Inpatient Hospital Stay: Payer: Medicare Other | Attending: Oncology

## 2020-08-14 ENCOUNTER — Other Ambulatory Visit: Payer: Self-pay

## 2020-08-14 DIAGNOSIS — E538 Deficiency of other specified B group vitamins: Secondary | ICD-10-CM

## 2020-08-14 DIAGNOSIS — R5383 Other fatigue: Secondary | ICD-10-CM

## 2020-08-14 DIAGNOSIS — D509 Iron deficiency anemia, unspecified: Secondary | ICD-10-CM

## 2020-08-14 LAB — CBC WITH DIFFERENTIAL/PLATELET
Abs Immature Granulocytes: 0.07 10*3/uL (ref 0.00–0.07)
Basophils Absolute: 0 10*3/uL (ref 0.0–0.1)
Basophils Relative: 0 %
Eosinophils Absolute: 0 10*3/uL (ref 0.0–0.5)
Eosinophils Relative: 0 %
HCT: 36.5 % (ref 36.0–46.0)
Hemoglobin: 11.7 g/dL — ABNORMAL LOW (ref 12.0–15.0)
Immature Granulocytes: 1 %
Lymphocytes Relative: 14 %
Lymphs Abs: 1.6 10*3/uL (ref 0.7–4.0)
MCH: 28.1 pg (ref 26.0–34.0)
MCHC: 32.1 g/dL (ref 30.0–36.0)
MCV: 87.5 fL (ref 80.0–100.0)
Monocytes Absolute: 0.6 10*3/uL (ref 0.1–1.0)
Monocytes Relative: 6 %
Neutro Abs: 8.5 10*3/uL — ABNORMAL HIGH (ref 1.7–7.7)
Neutrophils Relative %: 79 %
Platelets: 451 10*3/uL — ABNORMAL HIGH (ref 150–400)
RBC: 4.17 MIL/uL (ref 3.87–5.11)
RDW: 16.4 % — ABNORMAL HIGH (ref 11.5–15.5)
WBC: 10.8 10*3/uL — ABNORMAL HIGH (ref 4.0–10.5)
nRBC: 0 % (ref 0.0–0.2)

## 2020-08-14 LAB — IRON AND TIBC
Iron: 32 ug/dL (ref 28–170)
Saturation Ratios: 9 % — ABNORMAL LOW (ref 10.4–31.8)
TIBC: 356 ug/dL (ref 250–450)
UIBC: 324 ug/dL

## 2020-08-14 LAB — VITAMIN B12: Vitamin B-12: 896 pg/mL (ref 180–914)

## 2020-08-14 LAB — FERRITIN: Ferritin: 20 ng/mL (ref 11–307)

## 2020-08-14 LAB — FOLATE: Folate: 32 ng/mL (ref >5.9)

## 2020-08-15 ENCOUNTER — Inpatient Hospital Stay (HOSPITAL_BASED_OUTPATIENT_CLINIC_OR_DEPARTMENT_OTHER): Payer: Medicare Other | Admitting: Oncology

## 2020-08-15 ENCOUNTER — Other Ambulatory Visit: Payer: Self-pay

## 2020-08-15 ENCOUNTER — Other Ambulatory Visit: Payer: Medicare Other

## 2020-08-15 ENCOUNTER — Encounter: Payer: Self-pay | Admitting: Oncology

## 2020-08-15 ENCOUNTER — Inpatient Hospital Stay: Payer: Medicare Other

## 2020-08-15 VITALS — BP 152/84 | HR 102 | Temp 98.5°F | Resp 18 | Wt 138.7 lb

## 2020-08-15 VITALS — BP 142/76 | HR 96

## 2020-08-15 DIAGNOSIS — E538 Deficiency of other specified B group vitamins: Secondary | ICD-10-CM | POA: Diagnosis not present

## 2020-08-15 DIAGNOSIS — D509 Iron deficiency anemia, unspecified: Secondary | ICD-10-CM | POA: Diagnosis not present

## 2020-08-15 MED ORDER — SODIUM CHLORIDE 0.9 % IV SOLN
Freq: Once | INTRAVENOUS | Status: AC
Start: 2020-08-15 — End: 2020-08-15
  Filled 2020-08-15: qty 250

## 2020-08-15 MED ORDER — SODIUM CHLORIDE 0.9 % IV SOLN: 200.0000 mg | Freq: Once | INTRAVENOUS | Status: DC

## 2020-08-15 MED ORDER — CYANOCOBALAMIN 1000 MCG/ML IJ SOLN: 1000.0000 ug | INTRAMUSCULAR | 0 refills | Status: DC

## 2020-08-15 MED ORDER — IRON SUCROSE 20 MG/ML IV SOLN
200.0000 mg | Freq: Once | INTRAVENOUS | Status: AC
Start: 2020-08-15 — End: 2020-08-15
  Administered 2020-08-15: 200 mg via INTRAVENOUS
  Filled 2020-08-15: qty 10

## 2020-08-15 NOTE — Progress Notes (Signed)
[facility]  [address], [address] [place], [place] [address] Phone: [phone]  Fax: [phone]   Clinic Day:  08/15/2020  Referring physician: Kirk Ruths, MD  Chief Complaint: Ashley Mack is a 75 y.o. female with iron deficiency anemia and B12 deficiency  presents to follow-up.    INTERVAL HISTORY Ashley Mack is a 75 y.o. female who has above history reviewed by me today presents for follow up visit for management of anemia and B12 deficiency Problems and complaints are listed below: Patient previously followed up by Dr.Corcoran, patient switched care to me on 08/15/20 Extensive medical record review was performed by me  Chronic recurrent iron deficiency anemia  She had acolonoscopyon 03/18/2009. She has had several EGDswith the last on 10/12/2008. She states that she has had polyps, reflux and a hiatal hernia.EGD on 12/09/2016 was normal, other than demonstrating a small hiatal hernia. Colonoscopyon 12/09/2016 demonstrated friability with contact bleeding in the proximal transverse colon. A 1 mm sessile polyp was found in the sigmoid colon, in addition to grade II non-bleeding internal hemorrhoids were also noted. Pathology results demonstrated a tubular adenoma that was negative for dyplasia or malignancy. 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   Patient has had a multiple IV Venofer treatments. She received Venoferon 09/08/2016, 09/15/2016, 09/25/2016, 11/26/2016, 12/16/2016, 12/24/2016, x3 (05/27/2017 - 06/10/2017), x2 (03/03/2018 - 03/10/2018), x3 (06/22/2018 - 07/06/2018), x 2 (08/31/2018 - 09/07/2018), x 4 (03/02/2019 - 03/23/2019), and x 3 (07/05/2019 - 07/20/2019).She receives Venofer if her ferritin is <30.  Vitamin B12 deficiency, She began weekly B12on 09/08/2016 and continued x 6 weeks (last 10/16/2016). She began monthly B12on 11/26/2016 (last 02/17/2018).Patient used to get vitamin B12 prescription from Dr.  Mike Gip and her chiropractor injects for her.   She has rheumatoid arthritis. She has been treated with methotrexate, Imuran, leflunomide, and sulfasalazine. She is on prednisone 5 mg a day x 2 years. She is s/p kyphoplasty x 5 since 04/2017.   Today patient presents to establish care. She is disappointed that Dr. Mike Gip has relocated.  No new complaints.  She has gained weight since last visit.  Review of Systems  Constitutional: Positive for fatigue. Negative for appetite change and unexpected weight change.  HENT:   Negative for mouth sores.   Respiratory: Positive for shortness of breath.   Cardiovascular: Negative for chest pain.  Gastrointestinal: Negative for abdominal pain and blood in stool.  Genitourinary: Negative for dysuria.   Musculoskeletal: Positive for arthralgias and back pain.  Neurological: Negative for headaches.  Psychiatric/Behavioral: Negative for confusion.     Past Medical History:  Diagnosis Date  . Anemia   . Anxiety   . Arthritis    Rheumatoid  . Atony of gallbladder   . 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 Centrilobular emphysema ([place])   . Dyspnea   . Fatty liver   . GERD (gastroesophageal reflux disease)   . History of fractured vertebra    sees chiropractor  . 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 Iron (Fe) deficiency anemia   . Migraine headache    in past  . Migraines   . Motion sickness   . 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 Osteoporosis   . Rheumatoid arthritis ([place])   . Vertigo     Past Surgical History:  Procedure Laterality Date  . CATARACT EXTRACTION W/PHACO Right 08/12/2016   Procedure: CATARACT EXTRACTION PHACO AND INTRAOCULAR LENS PLACEMENT (IOC)  Right;  Surgeon: Leandrew Koyanagi, MD;  Location: [place];  Mack: Ophthalmology;  Laterality: Right;  . CATARACT EXTRACTION W/PHACO Left 09/30/2016   Procedure: CATARACT EXTRACTION PHACO AND INTRAOCULAR LENS PLACEMENT ([place]) Left;   Surgeon: Leandrew Koyanagi, MD;  Location: [place];  Mack: Ophthalmology;  Laterality: Left;  . COLONOSCOPY    . COLONOSCOPY WITH PROPOFOL N/A 12/09/2016   Procedure: COLONOSCOPY WITH PROPOFOL;  Surgeon: Toledo, Benay Pike, MD;  Location: [facility];  Mack: Endoscopy;  Laterality: N/A;  . ESOPHAGOGASTRODUODENOSCOPY    . ESOPHAGOGASTRODUODENOSCOPY (EGD) WITH PROPOFOL N/A 12/09/2016   Procedure: ESOPHAGOGASTRODUODENOSCOPY (EGD) WITH PROPOFOL;  Surgeon: Toledo, Benay Pike, MD;  Location: [facility];  Mack: Endoscopy;  Laterality: N/A;  . KYPHOPLASTY N/A 08/10/2017   Procedure: Hewitt Shorts;  Surgeon: Hessie Knows, MD;  Location: [facility];  Mack: Orthopedics;  Laterality: N/A;  . KYPHOPLASTY N/A 09/09/2017   Procedure: [id number]-Y5,W3;  Surgeon: Hessie Knows, MD;  Location: [facility];  Mack: Orthopedics;  Laterality: N/A;  . KYPHOPLASTY N/A 09/28/2017   Procedure: [id number];  Surgeon: Hessie Knows, MD;  Location: [facility];  Mack: Orthopedics;  Laterality: N/A;  . KYPHOPLASTY N/A 06/19/2019   Procedure: KYPHOPLASTY T5;  Surgeon: Hessie Knows, MD;  Location: [facility];  Mack: Orthopedics;  Laterality: N/A;  . OOPHORECTOMY Bilateral 2003  . RIGHT/LEFT HEART CATH AND CORONARY ANGIOGRAPHY Bilateral 10/19/2019   Procedure: RIGHT/LEFT HEART CATH AND CORONARY ANGIOGRAPHY;  Surgeon: Yolonda Kida, MD;  Location: [place] [facility];  Mack: Cardiovascular;  Laterality: Bilateral;    Family History  Problem Relation Age of Onset  . Kidney disease Mother   . Diabetes Mellitus II Brother     Social History:  reports that she quit smoking about 13 years ago. Her smoking use included cigarettes. She smoked 1.00 pack per day. She has never used smokeless tobacco. She reports that she does not drink alcohol and does not use drugs. She quit smoking in 2009. She lives in [place].Her step Tax adviser. The patient is accompanied by [employer]  today.  Allergies:  Allergies  Allergen Reactions  . Methotrexate Derivatives Nausea And Vomiting and Other (See Comments)    Flu like symptoms  . Other Nausea Only and Other (See Comments)    Arthritis medications . Unsure of what the med was.  . Ranitidine Hives  . Sulfasalazine Other (See Comments)    Stomach upset  . Topiramate Nausea Only    Current Medications: Current Outpatient Medications  Medication Sig Dispense Refill  . albuterol (VENTOLIN HFA) 108 (90 Base) MCG/ACT inhaler Inhale 2 puffs into the lungs every 6 (six) hours as needed for wheezing or shortness of breath.     . budesonide-formoterol (SYMBICORT) 160-4.5 MCG/ACT inhaler Inhale into the lungs.    . Cholecalciferol (VITAMIN D3) 2000 UNITS capsule Take 2,000 Units by mouth daily.     Marland Kitchen DEXILANT 60 MG capsule Take 60 mg by mouth daily.     . Fe Fum-FePoly-Vit C-Vit B3 (INTEGRA) 62.5-62.5-40-3 MG CAPS Take 1 capsule by mouth daily.    . fluticasone (FLONASE) 50 MCG/ACT nasal spray Place into the nose.    . Ginger, Zingiber officinalis, (GINGER PO) Take by mouth.    . ondansetron (ZOFRAN) 4 MG tablet take 1 tablet by oral route every 8 hours for nausea/vomiting    . ondansetron (ZOFRAN-ODT) 4 MG disintegrating tablet Take 1 tablet (4 mg total) by mouth every 8 (eight) hours as needed for nausea or vomiting. 30 tablet 0  .  predniSONE (DELTASONE) 5 MG tablet Take 5 mg by mouth daily.     . Saline (SIMPLY SALINE) 0.9 % AERS Place 1 spray into the nose as needed (congestion).     . cyanocobalamin (,VITAMIN B-12,) 1000 MCG/ML injection Inject 1 mL (1,000 mcg total) into the muscle every 30 (thirty) days. 10 each 0  . HYDROcodone-acetaminophen (NORCO/VICODIN) 5-325 MG tablet Take by mouth. (Patient not taking: Reported on 08/15/2020)     No current facility-administered medications for this visit.      Performance status (ECOG): 1  Vitals Blood pressure (!) 152/84, pulse (!) 102, temperature 98.5 F (36.9 C), resp.  rate 18, weight 138 lb 10.7 oz (62.9 kg), SpO2 98 %.   Physical Exam Vitals and nursing note reviewed.  Constitutional:      General: She is not in acute distress.    Appearance: She is well-developed. She is not diaphoretic.  HENT:     Head: Normocephalic and atraumatic.     Mouth/Throat:     Pharynx: No oropharyngeal exudate.  Eyes:     General: No scleral icterus.    Pupils: Pupils are equal, round, and reactive to light.  Cardiovascular:     Rate and Rhythm: Normal rate and regular rhythm.     Heart sounds: Normal heart sounds. No murmur heard.   Pulmonary:     Effort: Pulmonary effort is normal. No respiratory distress.     Breath sounds: No wheezing or rales.  Chest:     Chest wall: No tenderness.  Breasts:     Right: No supraclavicular adenopathy.     Left: No supraclavicular adenopathy.    Abdominal:     General: Bowel sounds are normal. There is no distension.     Palpations: Abdomen is soft. There is no mass.     Tenderness: There is no abdominal tenderness. There is no guarding or rebound.  Musculoskeletal:        General: No tenderness. Normal range of motion.     Cervical back: Normal range of motion and neck supple.  Lymphadenopathy:     Cervical: No cervical adenopathy.     Upper Body:     Right upper body: No supraclavicular adenopathy.     Left upper body: No supraclavicular adenopathy.  Skin:    General: Skin is warm and dry.     Comments: Fragile skin.  Neurological:     Mental Status: She is alert and oriented to person, place, and time. Mental status is at baseline.  Psychiatric:        Mood and Affect: Mood normal.    Appointment on 08/14/2020  Component Date Value Ref Range Status  . WBC 08/14/2020 10.8* 4.0 - 10.5 K/uL Final  . RBC 08/14/2020 4.17  3.87 - 5.11 MIL/uL Final  . Hemoglobin 08/14/2020 11.7* 12.0 - 15.0 g/dL Final  . HCT 08/14/2020 36.5  36.0 - 46.0 % Final  . MCV 08/14/2020 87.5  80.0 - 100.0 fL Final  . MCH 08/14/2020 28.1   26.0 - 34.0 pg Final  . MCHC 08/14/2020 32.1  30.0 - 36.0 g/dL Final  . RDW 08/14/2020 16.4* 11.5 - 15.5 % Final  . Platelets 08/14/2020 451* 150 - 400 K/uL Final  . nRBC 08/14/2020 0.0  0.0 - 0.2 % Final  . Neutrophils Relative % 08/14/2020 79  % Final  . Neutro Abs 08/14/2020 8.5* 1.7 - 7.7 K/uL Final  . Lymphocytes Relative 08/14/2020 14  % Final  . Lymphs Abs 08/14/2020  1.6  0.7 - 4.0 K/uL Final  . Monocytes Relative 08/14/2020 6  % Final  . Monocytes Absolute 08/14/2020 0.6  0.1 - 1.0 K/uL Final  . Eosinophils Relative 08/14/2020 0  % Final  . Eosinophils Absolute 08/14/2020 0.0  0.0 - 0.5 K/uL Final  . Basophils Relative 08/14/2020 0  % Final  . Basophils Absolute 08/14/2020 0.0  0.0 - 0.1 K/uL Final  . Immature Granulocytes 08/14/2020 1  % Final  . Abs Immature Granulocytes 08/14/2020 0.07  0.00 - 0.07 K/uL Final   Performed at [facility], [address], [place], [place] [address]  . Ferritin 08/14/2020 20  11 - 307 ng/mL Final   Performed at [facility], [address] [place], [place] [address]  . Iron 08/14/2020 32  28 - 170 ug/dL Final  . TIBC 08/14/2020 356  250 - 450 ug/dL Final  . Saturation Ratios 08/14/2020 9* 10.4 - 31.8 % Final  . UIBC 08/14/2020 324  ug/dL Final   Performed at [facility], [address] [place], [place] [address]  . Vitamin B-12 08/14/2020 896  180 - 914 pg/mL Final   Comment: (NOTE) This assay is not validated for testing neonatal or myeloproliferative syndrome specimens for Vitamin B12 levels. Performed at [facility], [place] [address] [place], [place] [address]   . Folate 08/14/2020 32.0  >5.9 ng/mL Final   Performed at [facility], [address] [place], [place] [address]    Assessment and plan:   1. B12 deficiency   2. Iron deficiency anemia, unspecified iron deficiency anemia type    #Recurrent chronic iron deficiency anemia, Patient has had GI work-up previously.  No  obvious bleeding was found. Labs are reviewed and discussed with patient. Recommend patient to proceed with IV Venofer 200 mg today, in 2 weeks in 4 weeks.  # B12 deficiency Patient has been getting prescriptions from Dr. Mike Gip and have chiropractor administered for her. Discussed with patient that she can also come to cancer center to have vitamin B12 injections.  Patient prefers to keep it the same way and get prescription reviewed. Monitor B12 and folate annually.  RTC in 3 months for labs (CBC with diff, iron TIBC ferritin).  I discussed the assessment and treatment plan with the patient.  The patient was provided an opportunity to ask questions and all were answered.  The patient agreed with the plan and demonstrated an understanding of the instructions.  The patient was advised to call back if the symptoms worsen or if the condition fails to improve as anticipated.  Earlie Server, MD, PhD Hematology Oncology [place] at [facility] 08/15/2020

## 2020-08-18 ENCOUNTER — Other Ambulatory Visit: Payer: Self-pay

## 2020-08-18 ENCOUNTER — Ambulatory Visit
Admission: EM | Admit: 2020-08-18 | Discharge: 2020-08-18 | Disposition: A | Discharge status: Home / Self Care | Payer: Medicare Other | Attending: Family Medicine | Admitting: Family Medicine

## 2020-08-18 ENCOUNTER — Encounter: Payer: Self-pay | Admitting: Emergency Medicine

## 2020-08-18 DIAGNOSIS — M069 Rheumatoid arthritis, unspecified: Secondary | ICD-10-CM | POA: Diagnosis not present

## 2020-08-18 DIAGNOSIS — Z7951 Long term (current) use of inhaled steroids: Secondary | ICD-10-CM | POA: Insufficient documentation

## 2020-08-18 DIAGNOSIS — Z7952 Long term (current) use of systemic steroids: Secondary | ICD-10-CM | POA: Insufficient documentation

## 2020-08-18 DIAGNOSIS — Z20822 Contact with and (suspected) exposure to covid-19: Secondary | ICD-10-CM | POA: Insufficient documentation

## 2020-08-18 DIAGNOSIS — R5383 Other fatigue: Secondary | ICD-10-CM | POA: Insufficient documentation

## 2020-08-18 DIAGNOSIS — R110 Nausea: Secondary | ICD-10-CM | POA: Insufficient documentation

## 2020-08-18 DIAGNOSIS — Z79899 Other long term (current) drug therapy: Secondary | ICD-10-CM | POA: Diagnosis not present

## 2020-08-18 DIAGNOSIS — Z87891 Personal history of nicotine dependence: Secondary | ICD-10-CM | POA: Diagnosis not present

## 2020-08-18 LAB — RESP PANEL BY RT-PCR (FLU A&B, COVID) ARPGX2
Influenza A by PCR: NEGATIVE
Influenza B by PCR: NEGATIVE
SARS Coronavirus 2 by RT PCR: NEGATIVE

## 2020-08-18 MED ORDER — ONDANSETRON 4 MG PO TBDP: 4.0000 mg | ORAL_TABLET | Freq: Three times a day (TID) | ORAL | 0 refills | Status: DC | PRN

## 2020-08-18 NOTE — Discharge Instructions (Signed)
Rest. Lots of fluids.  Medication as directed.  COVID test result will be back tomorrow.  Take care  Dr. Lacinda Axon

## 2020-08-18 NOTE — ED Provider Notes (Signed)
[facility]    CSN: [id number] Arrival date & time: 08/18/20  1123      History   Chief Complaint Chief Complaint  Patient presents with  . Nausea   HPI  75 year old female presents with nausea and fatigue.  Started abruptly yesterday.  She reports nausea but denies vomiting.  Reports fatigue.  She also reports arthralgias (suffers from rheumatoid arthritis).  No fever.  No respiratory symptoms.  She does note recent exposure to an individual who was exposed to [place].  No other reported symptoms.  No other complaints.  Past Medical History:  Diagnosis Date  . Anemia   . Anxiety   . Arthritis    Rheumatoid  . Atony of gallbladder   . 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 Centrilobular emphysema ([place])   . Dyspnea   . Fatty liver   . GERD (gastroesophageal reflux disease)   . History of fractured vertebra    sees chiropractor  . 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 Iron (Fe) deficiency anemia   . Migraine headache    in past  . Migraines   . Motion sickness   . 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 Osteoporosis   . Rheumatoid arthritis ([place])   . Vertigo     Patient Active Problem List   Diagnosis Date Noted  . 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 Arthropathy of thoracic facet joint 11/06/2019  . Cervical facet joint syndrome 11/06/2019  . GERD (gastroesophageal reflux disease) 06/18/2019  . 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[place]) 06/18/2019  . H/O kyphoplasty 06/18/2019  . Compression fracture of body of thoracic vertebra ([place]) 06/16/2019  . Flank pain   . Intractable back pain 08/08/2017  . Compression fracture of lumbar vertebra ([place]) 08/08/2017  . Abdominal pain, RUQ 08/06/2017  . Fatigue 05/29/2017  . Exertional dyspnea 05/29/2017  . Guaiac positive stools 11/26/2016  . Iron deficiency anemia 09/01/2016  . B12 deficiency 08/31/2016  . Normocytic anemia 08/30/2016  . Centrilobular emphysema ([place]) 05/09/2015   . HTN (hypertension), benign 12/10/2013    Past Surgical History:  Procedure Laterality Date  . CATARACT EXTRACTION W/PHACO Right 08/12/2016   Procedure: CATARACT EXTRACTION PHACO AND INTRAOCULAR LENS PLACEMENT ([place])  Right;  Surgeon: Leandrew Koyanagi, MD;  Location: [facility];  Service: Ophthalmology;  Laterality: Right;  . CATARACT EXTRACTION W/PHACO Left 09/30/2016   Procedure: CATARACT EXTRACTION PHACO AND INTRAOCULAR LENS PLACEMENT ([place]) Left;  Surgeon: Leandrew Koyanagi, MD;  Location: [place];  Service: Ophthalmology;  Laterality: Left;  . COLONOSCOPY    . COLONOSCOPY WITH PROPOFOL N/A 12/09/2016   Procedure: COLONOSCOPY WITH PROPOFOL;  Surgeon: Toledo, Benay Pike, MD;  Location: [facility];  Service: Endoscopy;  Laterality: N/A;  . ESOPHAGOGASTRODUODENOSCOPY    . ESOPHAGOGASTRODUODENOSCOPY (EGD) WITH PROPOFOL N/A 12/09/2016   Procedure: ESOPHAGOGASTRODUODENOSCOPY (EGD) WITH PROPOFOL;  Surgeon: Toledo, Benay Pike, MD;  Location: [facility];  Service: Endoscopy;  Laterality: N/A;  . KYPHOPLASTY N/A 08/10/2017   Procedure: Hewitt Shorts;  Surgeon: Hessie Knows, MD;  Location: [facility];  Service: Orthopedics;  Laterality: N/A;  . KYPHOPLASTY N/A 09/09/2017   Procedure: [id number]-U2,P5;  Surgeon: Hessie Knows, MD;  Location: [facility];  Service: Orthopedics;  Laterality: N/A;  . KYPHOPLASTY N/A 09/28/2017   Procedure: [id number];  Surgeon: Hessie Knows, MD;  Location: [facility];  Service: Orthopedics;  Laterality: N/A;  . KYPHOPLASTY N/A 06/19/2019   Procedure: KYPHOPLASTY T5;  Surgeon: Hessie Knows, MD;  Location: [facility];  Service: Orthopedics;  Laterality: N/A;  . OOPHORECTOMY Bilateral 2003  . RIGHT/LEFT HEART CATH AND CORONARY ANGIOGRAPHY Bilateral 10/19/2019   Procedure: RIGHT/LEFT HEART CATH AND CORONARY ANGIOGRAPHY;  Surgeon: Yolonda Kida, MD;  Location: [facility];  Service: Cardiovascular;  Laterality: Bilateral;    OB  History    Gravida  1   Para      Term      Preterm      AB  1   Living        SAB      IAB      Ectopic      Multiple      Live Births               Home Medications    Prior to Admission medications   Medication Sig Start Date End Date Taking? Authorizing Provider  ondansetron (ZOFRAN ODT) 4 MG disintegrating tablet Take 1 tablet (4 mg total) by mouth every 8 (eight) hours as needed for nausea or vomiting. 08/18/20  Yes Shannah Conteh G, DO  albuterol (VENTOLIN HFA) 108 (90 Base) MCG/ACT inhaler Inhale 2 puffs into the lungs every 6 (six) hours as needed for wheezing or shortness of breath.     [provider]  budesonide-formoterol (SYMBICORT) 160-4.5 MCG/ACT inhaler Inhale into the lungs. 01/23/20 01/22/21  [provider]  Cholecalciferol (VITAMIN D3) 2000 UNITS capsule Take 2,000 Units by mouth daily.     [provider]  cyanocobalamin (,VITAMIN B-12,) 1000 MCG/ML injection Inject 1 mL (1,000 mcg total) into the muscle every 30 (thirty) days. 08/15/20   Earlie Server, MD  DEXILANT 60 MG capsule Take 60 mg by mouth daily.  08/04/14   [provider]  Fe Fum-FePoly-Vit C-Vit B3 (INTEGRA) 62.5-62.5-40-3 MG CAPS Take 1 capsule by mouth daily. 11/29/13   [provider]  fluticasone (FLONASE) 50 MCG/ACT nasal spray Place into the nose.    [provider]  Ginger, Zingiber officinalis, (GINGER PO) Take by mouth.    [provider]  HYDROcodone-acetaminophen (NORCO/VICODIN) 5-325 MG tablet Take by mouth. Patient not taking: Reported on 08/15/2020 09/22/19   [provider]  predniSONE (DELTASONE) 5 MG tablet Take 5 mg by mouth daily.     [provider]  Saline (SIMPLY SALINE) 0.9 % AERS Place 1 spray into the nose as needed (congestion).     [provider]    Family History Family History  Problem Relation Age of Onset  . Kidney disease Mother   . Diabetes Mellitus II Brother     Social  History Social History   Tobacco Use  . Smoking status: Former Smoker    Packs/day: 1.00    Types: Cigarettes    Quit date: 2009    Years since quitting: 13.4  . Smokeless tobacco: Never Used  Vaping Use  . Vaping Use: Never used  Substance Use Topics  . Alcohol use: No  . Drug use: No     Allergies   Methotrexate derivatives, Other, Ranitidine, Sulfasalazine, and Topiramate   Review of Systems Review of Systems  Constitutional: Positive for fatigue.  Gastrointestinal: Positive for nausea. Negative for vomiting.  Musculoskeletal: Positive for arthralgias.   Physical Exam Triage Vital Signs ED Triage Vitals  Enc Vitals Group     BP 08/18/20 1154 (!) 157/73     Pulse Rate 08/18/20 1154 98     Resp 08/18/20 1154 14     Temp  08/18/20 1154 98.1 F (36.7 C)     Temp Source 08/18/20 1154 Oral     SpO2 08/18/20 1154 96 %     Weight 08/18/20 1152 138 lb 10.7 oz (62.9 kg)     Height 08/18/20 1152 5\' 2"  (1.575 m)     Head Circumference --      Peak Flow --      Pain Score 08/18/20 1152 0     Pain Loc --      Pain Edu? --      Excl. in [place]? --    Updated Vital Signs BP (!) 157/73 (BP Location: Left Arm)   Pulse 98   Temp 98.1 F (36.7 C) (Oral)   Resp 14   Ht 5\' 2"  (1.575 m)   Wt 62.9 kg   SpO2 96%   BMI 25.36 kg/m   Visual Acuity Right Eye Distance:   Left Eye Distance:   Bilateral Distance:    Right Eye Near:   Left Eye Near:    Bilateral Near:     Physical Exam Vitals and nursing note reviewed.  Constitutional:      General: She is not in acute distress.    Appearance: Normal appearance.  HENT:     Head: Normocephalic and atraumatic.  Eyes:     General:        Right eye: No discharge.        Left eye: No discharge.     Conjunctiva/sclera: Conjunctivae normal.  Cardiovascular:     Rate and Rhythm: Normal rate and regular rhythm.  Pulmonary:     Effort: Pulmonary effort is normal.     Breath sounds: Rales present.  Neurological:     Mental  Status: She is alert.  Psychiatric:        Mood and Affect: Mood normal.        Behavior: Behavior normal.    UC Treatments / Results  Labs (all labs ordered are listed, but only abnormal results are displayed) Labs Reviewed  RESP PANEL BY RT-PCR (FLU A&B, COVID) [id number]    EKG   Radiology No results found.  Procedures Procedures (including critical care time)  Medications Ordered in UC Medications - No data to display  Initial Impression / Assessment and Plan / UC Course  I have reviewed the triage vital signs and the nursing notes.  Pertinent labs & imaging results that were available during my care of the patient were reviewed by me and considered in my medical decision making (see chart for details).    75 year old female presents with nausea and generalized fatigue.  Exam unremarkable.  Awaiting COVID test results.  Treating nausea with Zofran.  Lots of fluids.  Supportive care.  Final Clinical Impressions(s) / UC Diagnoses   Final diagnoses:  Nausea  Fatigue, unspecified type     Discharge Instructions     Rest. Lots of fluids.  Medication as directed.  COVID test result will be back tomorrow.  Take care  Dr. Lacinda Axon    ED Prescriptions    Medication Sig Dispense Auth. Provider   ondansetron (ZOFRAN ODT) 4 MG disintegrating tablet Take 1 tablet (4 mg total) by mouth every 8 (eight) hours as needed for nausea or vomiting. 20 tablet Coral Spikes, DO     PDMP not reviewed this encounter.   Coral Spikes, [place] 08/18/20 1325

## 2020-08-18 NOTE — ED Triage Notes (Signed)
Patient c/o nausea that started yesterday.  Patient took ODT Zofran this morning which has helped.  Patient reports feeling tired and weak.  Patient states that she had her iron infusion this past Thursday. Patient denies fevers.

## 2020-08-29 ENCOUNTER — Other Ambulatory Visit: Payer: Self-pay

## 2020-08-29 ENCOUNTER — Inpatient Hospital Stay: Payer: Medicare Other

## 2020-08-29 VITALS — BP 169/88 | HR 89 | Temp 98.7°F | Resp 18

## 2020-08-29 DIAGNOSIS — D509 Iron deficiency anemia, unspecified: Secondary | ICD-10-CM | POA: Diagnosis not present

## 2020-08-29 DIAGNOSIS — E538 Deficiency of other specified B group vitamins: Secondary | ICD-10-CM

## 2020-08-29 MED ORDER — SODIUM CHLORIDE 0.9 % IV SOLN
Freq: Once | INTRAVENOUS | Status: AC
  Filled 2020-08-29: qty 250

## 2020-08-29 MED ORDER — SODIUM CHLORIDE 0.9 % IV SOLN: 200.0000 mg | Freq: Once | INTRAVENOUS | Status: DC

## 2020-08-29 MED ORDER — IRON SUCROSE 20 MG/ML IV SOLN
200.0000 mg | Freq: Once | INTRAVENOUS | Status: AC
  Administered 2020-08-29: 200 mg via INTRAVENOUS
  Filled 2020-08-29: qty 10

## 2020-09-12 ENCOUNTER — Other Ambulatory Visit: Payer: Self-pay

## 2020-09-12 ENCOUNTER — Inpatient Hospital Stay: Payer: Medicare Other

## 2020-09-12 VITALS — BP 142/83 | HR 91 | Temp 97.3°F | Resp 18

## 2020-09-12 DIAGNOSIS — E538 Deficiency of other specified B group vitamins: Secondary | ICD-10-CM

## 2020-09-12 DIAGNOSIS — D509 Iron deficiency anemia, unspecified: Secondary | ICD-10-CM | POA: Diagnosis not present

## 2020-09-12 MED ORDER — SODIUM CHLORIDE 0.9 % IV SOLN: 200.0000 mg | Freq: Once | INTRAVENOUS | Status: DC

## 2020-09-12 MED ORDER — IRON SUCROSE 20 MG/ML IV SOLN
200.0000 mg | Freq: Once | INTRAVENOUS | Status: AC
Start: 2020-09-12 — End: 2020-09-12
  Administered 2020-09-12: 200 mg via INTRAVENOUS
  Filled 2020-09-12: qty 10

## 2020-09-12 MED ORDER — SODIUM CHLORIDE 0.9 % IV SOLN
INTRAVENOUS | Status: DC | PRN
  Filled 2020-09-12: qty 250

## 2020-09-15 ENCOUNTER — Other Ambulatory Visit: Payer: Self-pay

## 2020-09-15 ENCOUNTER — Emergency Department: Payer: Medicare Other

## 2020-09-15 ENCOUNTER — Emergency Department
Admission: EM | Admit: 2020-09-15 | Discharge: 2020-09-15 | Disposition: A | Discharge status: Home / Self Care | Payer: Medicare Other | Attending: Emergency Medicine | Admitting: Emergency Medicine

## 2020-09-15 ENCOUNTER — Encounter: Payer: Self-pay | Admitting: Emergency Medicine

## 2020-09-15 DIAGNOSIS — S299XXA Unspecified injury of thorax, initial encounter: Secondary | ICD-10-CM | POA: Diagnosis present

## 2020-09-15 DIAGNOSIS — I10 Essential (primary) hypertension: Secondary | ICD-10-CM | POA: Diagnosis not present

## 2020-09-15 DIAGNOSIS — S22068A Other fracture of T7-T8 thoracic vertebra, initial encounter for closed fracture: Secondary | ICD-10-CM | POA: Insufficient documentation

## 2020-09-15 DIAGNOSIS — X58XXXA Exposure to other specified factors, initial encounter: Secondary | ICD-10-CM | POA: Diagnosis not present

## 2020-09-15 DIAGNOSIS — Z87891 Personal history of nicotine dependence: Secondary | ICD-10-CM | POA: Diagnosis not present

## 2020-09-15 DIAGNOSIS — M4854XA Collapsed vertebra, not elsewhere classified, thoracic region, initial encounter for fracture: Secondary | ICD-10-CM

## 2020-09-15 LAB — HEPATIC FUNCTION PANEL
ALT: 16 U/L (ref 0–44)
AST: 23 U/L (ref 15–41)
Albumin: 4.1 g/dL (ref 3.5–5.0)
Alkaline Phosphatase: 56 U/L (ref 38–126)
Bilirubin, Direct: <0.1 mg/dL (ref 0.0–0.2)
Total Bilirubin: 0.7 mg/dL (ref 0.3–1.2)
Total Protein: 7 g/dL (ref 6.5–8.1)

## 2020-09-15 LAB — CBC WITH DIFFERENTIAL/PLATELET
Abs Immature Granulocytes: 0.1 10*3/uL — ABNORMAL HIGH (ref 0.00–0.07)
Basophils Absolute: 0.1 10*3/uL (ref 0.0–0.1)
Basophils Relative: 0 %
Eosinophils Absolute: 0 10*3/uL (ref 0.0–0.5)
Eosinophils Relative: 0 %
HCT: 41.1 % (ref 36.0–46.0)
Hemoglobin: 13.1 g/dL (ref 12.0–15.0)
Immature Granulocytes: 1 %
Lymphocytes Relative: 17 %
Lymphs Abs: 2.2 10*3/uL (ref 0.7–4.0)
MCH: 28.7 pg (ref 26.0–34.0)
MCHC: 31.9 g/dL (ref 30.0–36.0)
MCV: 89.9 fL (ref 80.0–100.0)
Monocytes Absolute: 1 10*3/uL (ref 0.1–1.0)
Monocytes Relative: 7 %
Neutro Abs: 10.1 10*3/uL — ABNORMAL HIGH (ref 1.7–7.7)
Neutrophils Relative %: 75 %
Platelets: 358 10*3/uL (ref 150–400)
RBC: 4.57 MIL/uL (ref 3.87–5.11)
RDW: 17.5 % — ABNORMAL HIGH (ref 11.5–15.5)
WBC: 13.5 10*3/uL — ABNORMAL HIGH (ref 4.0–10.5)
nRBC: 0 % (ref 0.0–0.2)

## 2020-09-15 LAB — BASIC METABOLIC PANEL
Anion gap: 10 (ref 5–15)
BUN: 12 mg/dL (ref 8–23)
CO2: 26 mmol/L (ref 22–32)
Calcium: 8.8 mg/dL — ABNORMAL LOW (ref 8.9–10.3)
Chloride: 98 mmol/L (ref 98–111)
Creatinine, Ser: 0.97 mg/dL (ref 0.44–1.00)
GFR, Estimated: >60 mL/min (ref >60)
Glucose, Bld: 109 mg/dL — ABNORMAL HIGH (ref 70–99)
Potassium: 3.8 mmol/L (ref 3.5–5.1)
Sodium: 134 mmol/L — ABNORMAL LOW (ref 135–145)

## 2020-09-15 LAB — TROPONIN I (HIGH SENSITIVITY): Troponin I (High Sensitivity): 9 ng/L (ref <18)

## 2020-09-15 LAB — LIPASE, BLOOD: Lipase: 32 U/L (ref 11–51)

## 2020-09-15 MED ORDER — ACETAMINOPHEN 500 MG PO TABS
1000.0000 mg | ORAL_TABLET | Freq: Once | ORAL | Status: AC
  Administered 2020-09-15: 1000 mg via ORAL
  Filled 2020-09-15: qty 2

## 2020-09-15 MED ORDER — LIDOCAINE 5 % EX PTCH: 1.0000 | MEDICATED_PATCH | Freq: Two times a day (BID) | CUTANEOUS | 0 refills | Status: AC

## 2020-09-15 MED ORDER — ONDANSETRON HCL 4 MG/2ML IJ SOLN
4.0000 mg | Freq: Once | INTRAMUSCULAR | Status: AC
  Administered 2020-09-15: 4 mg via INTRAVENOUS
  Filled 2020-09-15: qty 2

## 2020-09-15 MED ORDER — OXYCODONE HCL 5 MG PO TABS
2.5000 mg | ORAL_TABLET | Freq: Once | ORAL | Status: AC
  Administered 2020-09-15: 2.5 mg via ORAL
  Filled 2020-09-15: qty 1

## 2020-09-15 MED ORDER — LIDOCAINE 5 % EX PTCH
1.0000 | MEDICATED_PATCH | CUTANEOUS | Status: DC
  Administered 2020-09-15: 1 via TRANSDERMAL
  Filled 2020-09-15: qty 1

## 2020-09-15 MED ORDER — ONDANSETRON 4 MG PO TBDP: 4.0000 mg | ORAL_TABLET | Freq: Three times a day (TID) | ORAL | 0 refills | Status: DC | PRN

## 2020-09-15 MED ORDER — METOCLOPRAMIDE HCL 5 MG/ML IJ SOLN
10.0000 mg | Freq: Once | INTRAMUSCULAR | Status: AC
  Administered 2020-09-15: 10 mg via INTRAVENOUS
  Filled 2020-09-15: qty 2

## 2020-09-15 MED ORDER — HYDROMORPHONE HCL 1 MG/ML IJ SOLN
0.5000 mg | Freq: Once | INTRAMUSCULAR | Status: AC
  Administered 2020-09-15: 0.5 mg via INTRAVENOUS
  Filled 2020-09-15: qty 1

## 2020-09-15 MED ORDER — OXYCODONE HCL 5 MG PO TABS: 2.5000 mg | ORAL_TABLET | ORAL | 0 refills | Status: AC | PRN

## 2020-09-15 NOTE — ED Notes (Signed)
Taken to CT.

## 2020-09-15 NOTE — ED Provider Notes (Signed)
Requested to reassess the patient to assure her nausea is under control prior to discharge.  Patient sitting comfortably in hallway, reports both her pain and nausea are well controlled.  Understands plan of care and follow-up recommendations.  Patient resting comfortably alert oriented no distress.  She is not driving herself   [name], MD 09/15/20 1627

## 2020-09-15 NOTE — Discharge Instructions (Addendum)
Call your doctor to help with your compression fractures and your compression pain.  Take Tylenol 1 g every 8 hours, use oxycodone for breakthrough pain use the lidocaine patches as well.  1. Upon comparison of the cervical, thoracic, and lumbar spine to prior MRI from 09/16/2019, there is been an interval 20% superior endplate compression fracture at T7 and an interval 30% superior and inferior endplate compression fracture at T8. The T7 superior endplate compression has some subtle associated sclerosis and is likely subacute; the T8 compression fracture is age indeterminate but more likely chronic. 2. Remote, stable compression fractures at T1, T4, T5, T6, T9, T11, T12, L1, L2, and L4. Of these, there are vertebral augmentations at T5, T11, T12, L1, L2, and L4. The appearance is compatible with osteoporosis. 3.  Aortic Atherosclerosis (ICD10-I70.0).  Coronary atherosclerosis. 4. 3 by 4 mm right upper lung perifissural nodule. No follow-up needed if patient is low-risk. Non-contrast chest CT can be considered in 12 months if patient is high-risk. This recommendation follows the consensus statement: Guidelines for Management of Incidental Pulmonary Nodules Detected on CT Images: From the Fleischner Society 2017; Radiology 2017; 284:228-243. 5. Potential mild left subarticular lateral recess stenosis at L4-5 due to disc bulge.  Take oxycodone as prescribed. Do not drink alcohol, drive or participate in any other potentially dangerous activities while taking this medication as it may make you sleepy. Do not take this medication with any other sedating medications, either prescription or over-the-counter. If you were prescribed Percocet or Vicodin, do not take these with acetaminophen (Tylenol) as it is already contained within these medications.  This medication is an opiate (or narcotic) pain medication and can be habit forming. Use it as little as possible to achieve adequate pain control.  Do not use or use it with extreme caution if you have a history of opiate abuse or dependence. If you are on a pain contract with your primary care doctor or a pain specialist, be sure to let them know you were prescribed this medication today from the Emergency Department. This medication is intended for your use only - do not give any to anyone else and keep it in a secure place where nobody else, especially children, have access to it.

## 2020-09-15 NOTE — ED Provider Notes (Signed)
[facility] Emergency Department Provider Note  ____________________________________________   Event Date/Time   First MD Initiated Contact with Patient 09/15/20 1138     (approximate)  I have reviewed the triage vital signs and the nursing notes.   HISTORY  Chief Complaint Back Pain    HPI Ashley Mack is a 75 y.o. female with emphysema, recurrent compression fractures who comes in with concerns for upper back pain.  Patient reports having a history of kyphoplasties.  She states that she has been having some pain in her upper back but yesterday the pain became more severe, constant, nothing made it better or worse.  She denies taking any Tylenol or other medications at home.  She denies any pain in her chest.  She states that this pain feels similar to her prior compression fractures.  She denies any new shortness of breath.  She states that it does hurt when she breathes but this is similar when she is had prior compression fractures and back pain.  Denies any urinary symptoms, loss of bladder or bowel, numbness or tingling in her legs or weakness in her legs.          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)    Iron (Fe) deficiency anemia    Migraine headache    in past   Migraines    Motion sickness    No natural teeth    Osteoporosis    Rheumatoid arthritis ([place])    Vertigo     Patient Active Problem List   Diagnosis Date Noted   Thoracic disc herniation 11/06/2019   Arthropathy of thoracic facet joint 11/06/2019   Cervical facet joint syndrome 11/06/2019   GERD (gastroesophageal reflux disease) 06/18/2019    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[place]) 06/18/2019   H/O kyphoplasty 06/18/2019   Compression fracture of body of thoracic vertebra (HCC) 06/16/2019   Flank pain    Intractable back pain 08/08/2017   Compression fracture of lumbar vertebra ([place]) 08/08/2017   Abdominal pain, RUQ 08/06/2017   Fatigue 05/29/2017   Exertional dyspnea 05/29/2017   Guaiac positive stools 11/26/2016   Iron deficiency anemia 09/01/2016   B12 deficiency 08/31/2016   Normocytic anemia 08/30/2016   Centrilobular emphysema ([place]) 05/09/2015   HTN (hypertension), benign 12/10/2013    Past Surgical History:  Procedure Laterality Date   CATARACT EXTRACTION W/PHACO Right 08/12/2016   Procedure: CATARACT EXTRACTION PHACO AND INTRAOCULAR LENS PLACEMENT ([place])  Right;  Surgeon: Leandrew Koyanagi, MD;  Location: [place];  Service: Ophthalmology;  Laterality: Right;   CATARACT EXTRACTION W/PHACO Left 09/30/2016   Procedure: CATARACT EXTRACTION PHACO AND INTRAOCULAR LENS PLACEMENT ([place]) Left;  Surgeon: Leandrew Koyanagi, MD;  Location: [place];  Service: Ophthalmology;  Laterality: Left;   COLONOSCOPY     COLONOSCOPY WITH PROPOFOL N/A 12/09/2016   Procedure: COLONOSCOPY WITH PROPOFOL;  Surgeon: Toledo, Benay Pike, MD;  Location: [facility];  Service: Endoscopy;  Laterality: N/A;   ESOPHAGOGASTRODUODENOSCOPY     ESOPHAGOGASTRODUODENOSCOPY (EGD) WITH PROPOFOL N/A 12/09/2016   Procedure: ESOPHAGOGASTRODUODENOSCOPY (  EGD) WITH PROPOFOL;  Surgeon: Toledo, Benay Pike, MD;  Location: [facility];  Service: Endoscopy;  Laterality: N/A;   KYPHOPLASTY N/A 08/10/2017   Procedure: Hewitt Shorts;  Surgeon: Hessie Knows, MD;  Location: [facility];  Service: Orthopedics;  Laterality: N/A;   KYPHOPLASTY N/A 09/09/2017   Procedure: [id number]-Q5,Z5;  Surgeon: Hessie Knows, MD;  Location: [facility];  Service: Orthopedics;  Laterality: N/A;   KYPHOPLASTY N/A 09/28/2017   Procedure: [id number];   Surgeon: Hessie Knows, MD;  Location: [facility];  Service: Orthopedics;  Laterality: N/A;   KYPHOPLASTY N/A 06/19/2019   Procedure: KYPHOPLASTY T5;  Surgeon: Hessie Knows, MD;  Location: [facility];  Service: Orthopedics;  Laterality: N/A;   OOPHORECTOMY Bilateral 2003   RIGHT/LEFT HEART CATH AND CORONARY ANGIOGRAPHY Bilateral 10/19/2019   Procedure: RIGHT/LEFT HEART CATH AND CORONARY ANGIOGRAPHY;  Surgeon: Yolonda Kida, MD;  Location: [place] [facility];  Service: Cardiovascular;  Laterality: Bilateral;    Prior to Admission medications   Medication Sig Start Date End Date Taking? Authorizing Provider  albuterol (VENTOLIN HFA) 108 (90 Base) MCG/ACT inhaler Inhale 2 puffs into the lungs every 6 (six) hours as needed for wheezing or shortness of breath.     [provider]  budesonide-formoterol (SYMBICORT) 160-4.5 MCG/ACT inhaler Inhale into the lungs. 01/23/20 01/22/21  [provider]  Cholecalciferol (VITAMIN D3) 2000 UNITS capsule Take 2,000 Units by mouth daily.     [provider]  cyanocobalamin (,VITAMIN B-12,) 1000 MCG/ML injection Inject 1 mL (1,000 mcg total) into the muscle every 30 (thirty) days. 08/15/20   Earlie Server, MD  DEXILANT 60 MG capsule Take 60 mg by mouth daily.  08/04/14   [provider]  Fe Fum-FePoly-Vit C-Vit B3 (INTEGRA) 62.5-62.5-40-3 MG CAPS Take 1 capsule by mouth daily. 11/29/13   [provider]  fluticasone (FLONASE) 50 MCG/ACT nasal spray Place into the nose.    [provider]  Ginger, Zingiber officinalis, (GINGER PO) Take by mouth.    [provider]  HYDROcodone-acetaminophen (NORCO/VICODIN) 5-325 MG tablet Take by mouth. Patient not taking: Reported on 08/15/2020 09/22/19   [provider]  ondansetron (ZOFRAN ODT) 4 MG disintegrating tablet Take 1 tablet (4 mg total) by mouth every 8 (eight) hours as needed for nausea or vomiting. 08/18/20   Coral Spikes, DO  predniSONE (DELTASONE) 5 MG tablet  Take 5 mg by mouth daily.     [provider]  Saline (SIMPLY SALINE) 0.9 % AERS Place 1 spray into the nose as needed (congestion).     [provider]    Allergies Methotrexate derivatives, Other, Ranitidine, Sulfasalazine, and Topiramate  Family History  Problem Relation Age of Onset   Kidney disease Mother    Diabetes Mellitus II Brother     Social History Social History   Tobacco Use   Smoking status: Former    Packs/day: 1.00    Pack years: 0.00    Types: Cigarettes    Quit date: 2009    Years since quitting: 13.5   Smokeless tobacco: Never  Vaping Use   Vaping Use: Never used  Substance Use Topics   Alcohol use: No   Drug use: No      Review of Systems Constitutional: No fever/chills Eyes: No visual changes. ENT: No sore throat. Cardiovascular: Denies chest pain. Respiratory: Denies shortness of breath. Gastrointestinal: No abdominal pain.  No nausea, no vomiting.  No diarrhea.  No constipation. Genitourinary: Negative for dysuria. Musculoskeletal: Positive back pain Skin: Negative  for rash. Neurological: Negative for headaches, focal weakness or numbness. All other ROS negative ____________________________________________   PHYSICAL EXAM:  VITAL SIGNS: ED Triage Vitals  Enc Vitals Group     BP 09/15/20 0941 (!) 150/71     Pulse Rate 09/15/20 0941 (!) 113     Resp 09/15/20 0941 20     Temp 09/15/20 0941 98.1 F (36.7 C)     Temp Source 09/15/20 0941 Oral     SpO2 09/15/20 0941 93 %     Weight 09/15/20 0941 138 lb 10.7 oz (62.9 kg)     Height 09/15/20 0941 5\' 2"  (1.575 m)     Head Circumference --      Peak Flow --      Pain Score 09/15/20 0941 10     Pain Loc --      Pain Edu? --      Excl. in [place]? --     Constitutional: Alert and oriented.  Appears in pain Eyes: Conjunctivae are normal. EOMI. Head: Atraumatic. Nose: No congestion/rhinnorhea. Mouth/Throat: Mucous membranes are moist.   Neck: No stridor. Trachea  Midline. FROM Cardiovascular: Normal rate, regular rhythm. Grossly normal heart sounds.  Good peripheral circulation. Respiratory: Normal respiratory effort.  No retractions. Lungs CTAB. Gastrointestinal: Soft and nontender. No distention. No abdominal bruits.  Musculoskeletal: No lower extremity tenderness nor edema.  No joint effusions.  Able to lift both legs up off the bed.  No numbness or tingling in her legs.  Equal strength in her hands Neurologic:  Normal speech and language. No gross focal neurologic deficits are appreciated.  Skin:  Skin is warm, dry and intact. No rash noted. Psychiatric: Mood and affect are normal. Speech and behavior are normal. GU: Deferred  Back: Some low C, upper T-spine tenderness as well as little bit of lower lumbar spine tenderness.  Patient in significant more pain with moving around. ____________________________________________   LABS (all labs ordered are listed, but only abnormal results are displayed)  Labs Reviewed  CBC WITH DIFFERENTIAL/PLATELET - Abnormal; Notable for the following components:      Result Value   WBC 13.5 (*)    RDW 17.5 (*)    Neutro Abs 10.1 (*)    Abs Immature Granulocytes 0.10 (*)    All other components within normal limits  BASIC METABOLIC PANEL - Abnormal; Notable for the following components:   Sodium 134 (*)    Glucose, Bld 109 (*)    Calcium 8.8 (*)    All other components within normal limits  HEPATIC FUNCTION PANEL  LIPASE, BLOOD  TROPONIN I (HIGH SENSITIVITY)   ____________________________________________   ED ECG REPORT I, Vanessa Meridian, the attending physician, personally viewed and interpreted this ECG.  Normal sinus rate of 86, no ST elevation, no T wave versions, normal intervals ____________________________________________  RADIOLOGY Robert Bellow, personally viewed and evaluated these images (plain radiographs) as part of my medical decision making, as well as reviewing the written report by  the radiologist.  ED MD interpretation: No pneumonia  Official radiology report(s): CT Cervical Spine Wo Contrast  Result Date: 09/15/2020 CLINICAL DATA:  Fractures, back pain. EXAM: CT CERVICAL, THORACIC, AND LUMBAR SPINE WITHOUT CONTRAST TECHNIQUE: Multidetector CT imaging of the cervical, thoracic and lumbar spine was performed without intravenous contrast. Multiplanar CT image reconstructions were also generated. COMPARISON:  Total spine MRI from 09/16/2019 FINDINGS: CT CERVICAL SPINE FINDINGS Alignment: No vertebral subluxation is observed. Skull base and vertebrae: Minimal chronic anterior wedging at  T1. Spurring and loss of articular space at the anterior C1-2 articulation. Bony demineralization. No cervical spine fracture or acute subluxation is identified. Soft tissues and spinal canal: Bilateral common carotid atherosclerotic calcification. Disc levels:  No significant osseous foraminal narrowing. CT THORACIC SPINE FINDINGS Alignment: No vertebral subluxation is observed. Vertebrae: Chronic compression fractures with vertebral augmentation at T5, T11, and T12. The degree of compression of these levels is similar to 09/16/2019. Mild chronic superior endplate compression fractures at T4, T6, and T9 similar to the prior exam. Mild superior endplate compression fracture at T7 with about 20% loss of vertebral height centrally noted on image 115 of series 5, new compared to the 09/26/2019 exam. This may well represent a is mild subacute compression given the subtle sclerosis along the superior endplate. Interval 30% loss of vertebral height at T8 due to superior and inferior endplate concavities, increased from 09/16/2019. No significant vertebral sclerosis. Small hemangioma in the T8 vertebral body noted. Paraspinal and other soft tissues: No significant paraspinal edema. Coronary, aortic arch, and branch vessel atherosclerotic vascular disease. 3 by 4 mm perifissural nodule along the right upper major  fissure, not seen on 06/16/2019. Disc levels: No significant bony impingement identified. CT LUMBAR SPINE FINDINGS Segmentation: The lowest lumbar type non-rib-bearing vertebra is labeled as L5. Alignment: No vertebral subluxation is observed. Vertebrae: Stable vertebral augmentations at L1, L2, and L4 without progressive collapse at these levels. There is 4 mm of chronic posterior bony retropulsion of the L2 superior endplate level, unchanged from previous. No new lumbar fracture is identified. Paraspinal and other soft tissues: Aortoiliac atherosclerotic vascular disease. Disc levels: The posterior bony retropulsion from the L2 superior endplate level contributes to borderline central narrowing of the thecal sac. A disc bulge at the L4-5 level contributes to borderline central stenosis and potentially mild left subarticular lateral recess stenosis. IMPRESSION: 1. Upon comparison of the cervical, thoracic, and lumbar spine to prior MRI from 09/16/2019, there is been an interval 20% superior endplate compression fracture at T7 and an interval 30% superior and inferior endplate compression fracture at T8. The T7 superior endplate compression has some subtle associated sclerosis and is likely subacute; the T8 compression fracture is age indeterminate but more likely chronic. 2. Remote, stable compression fractures at T1, T4, T5, T6, T9, T11, T12, L1, L2, and L4. Of these, there are vertebral augmentations at T5, T11, T12, L1, L2, and L4. The appearance is compatible with osteoporosis. 3.  Aortic Atherosclerosis (ICD10-I70.0).  Coronary atherosclerosis. 4. 3 by 4 mm right upper lung perifissural nodule. No follow-up needed if patient is low-risk. Non-contrast chest CT can be considered in 12 months if patient is high-risk. This recommendation follows the consensus statement: Guidelines for Management of Incidental Pulmonary Nodules Detected on CT Images: From the Fleischner Society 2017; Radiology 2017; 284:228-243. 5.  Potential mild left subarticular lateral recess stenosis at L4-5 due to disc bulge. Electronically Signed   By: Van Clines M.D.   On: 09/15/2020 13:29   CT Thoracic Spine Wo Contrast  Result Date: 09/15/2020 CLINICAL DATA:  Fractures, back pain. EXAM: CT CERVICAL, THORACIC, AND LUMBAR SPINE WITHOUT CONTRAST TECHNIQUE: Multidetector CT imaging of the cervical, thoracic and lumbar spine was performed without intravenous contrast. Multiplanar CT image reconstructions were also generated. COMPARISON:  Total spine MRI from 09/16/2019 FINDINGS: CT CERVICAL SPINE FINDINGS Alignment: No vertebral subluxation is observed. Skull base and vertebrae: Minimal chronic anterior wedging at T1. Spurring and loss of articular space at the anterior C1-2 articulation. Bony  demineralization. No cervical spine fracture or acute subluxation is identified. Soft tissues and spinal canal: Bilateral common carotid atherosclerotic calcification. Disc levels:  No significant osseous foraminal narrowing. CT THORACIC SPINE FINDINGS Alignment: No vertebral subluxation is observed. Vertebrae: Chronic compression fractures with vertebral augmentation at T5, T11, and T12. The degree of compression of these levels is similar to 09/16/2019. Mild chronic superior endplate compression fractures at T4, T6, and T9 similar to the prior exam. Mild superior endplate compression fracture at T7 with about 20% loss of vertebral height centrally noted on image 115 of series 5, new compared to the 09/26/2019 exam. This may well represent a is mild subacute compression given the subtle sclerosis along the superior endplate. Interval 30% loss of vertebral height at T8 due to superior and inferior endplate concavities, increased from 09/16/2019. No significant vertebral sclerosis. Small hemangioma in the T8 vertebral body noted. Paraspinal and other soft tissues: No significant paraspinal edema. Coronary, aortic arch, and branch vessel atherosclerotic  vascular disease. 3 by 4 mm perifissural nodule along the right upper major fissure, not seen on 06/16/2019. Disc levels: No significant bony impingement identified. CT LUMBAR SPINE FINDINGS Segmentation: The lowest lumbar type non-rib-bearing vertebra is labeled as L5. Alignment: No vertebral subluxation is observed. Vertebrae: Stable vertebral augmentations at L1, L2, and L4 without progressive collapse at these levels. There is 4 mm of chronic posterior bony retropulsion of the L2 superior endplate level, unchanged from previous. No new lumbar fracture is identified. Paraspinal and other soft tissues: Aortoiliac atherosclerotic vascular disease. Disc levels: The posterior bony retropulsion from the L2 superior endplate level contributes to borderline central narrowing of the thecal sac. A disc bulge at the L4-5 level contributes to borderline central stenosis and potentially mild left subarticular lateral recess stenosis. IMPRESSION: 1. Upon comparison of the cervical, thoracic, and lumbar spine to prior MRI from 09/16/2019, there is been an interval 20% superior endplate compression fracture at T7 and an interval 30% superior and inferior endplate compression fracture at T8. The T7 superior endplate compression has some subtle associated sclerosis and is likely subacute; the T8 compression fracture is age indeterminate but more likely chronic. 2. Remote, stable compression fractures at T1, T4, T5, T6, T9, T11, T12, L1, L2, and L4. Of these, there are vertebral augmentations at T5, T11, T12, L1, L2, and L4. The appearance is compatible with osteoporosis. 3.  Aortic Atherosclerosis (ICD10-I70.0).  Coronary atherosclerosis. 4. 3 by 4 mm right upper lung perifissural nodule. No follow-up needed if patient is low-risk. Non-contrast chest CT can be considered in 12 months if patient is high-risk. This recommendation follows the consensus statement: Guidelines for Management of Incidental Pulmonary Nodules Detected on  CT Images: From the Fleischner Society 2017; Radiology 2017; 284:228-243. 5. Potential mild left subarticular lateral recess stenosis at L4-5 due to disc bulge. Electronically Signed   By: Van Clines M.D.   On: 09/15/2020 13:29   CT Lumbar Spine Wo Contrast  Result Date: 09/15/2020 CLINICAL DATA:  Fractures, back pain. EXAM: CT CERVICAL, THORACIC, AND LUMBAR SPINE WITHOUT CONTRAST TECHNIQUE: Multidetector CT imaging of the cervical, thoracic and lumbar spine was performed without intravenous contrast. Multiplanar CT image reconstructions were also generated. COMPARISON:  Total spine MRI from 09/16/2019 FINDINGS: CT CERVICAL SPINE FINDINGS Alignment: No vertebral subluxation is observed. Skull base and vertebrae: Minimal chronic anterior wedging at T1. Spurring and loss of articular space at the anterior C1-2 articulation. Bony demineralization. No cervical spine fracture or acute subluxation is identified. Soft tissues and  spinal canal: Bilateral common carotid atherosclerotic calcification. Disc levels:  No significant osseous foraminal narrowing. CT THORACIC SPINE FINDINGS Alignment: No vertebral subluxation is observed. Vertebrae: Chronic compression fractures with vertebral augmentation at T5, T11, and T12. The degree of compression of these levels is similar to 09/16/2019. Mild chronic superior endplate compression fractures at T4, T6, and T9 similar to the prior exam. Mild superior endplate compression fracture at T7 with about 20% loss of vertebral height centrally noted on image 115 of series 5, new compared to the 09/26/2019 exam. This may well represent a is mild subacute compression given the subtle sclerosis along the superior endplate. Interval 30% loss of vertebral height at T8 due to superior and inferior endplate concavities, increased from 09/16/2019. No significant vertebral sclerosis. Small hemangioma in the T8 vertebral body noted. Paraspinal and other soft tissues: No significant  paraspinal edema. Coronary, aortic arch, and branch vessel atherosclerotic vascular disease. 3 by 4 mm perifissural nodule along the right upper major fissure, not seen on 06/16/2019. Disc levels: No significant bony impingement identified. CT LUMBAR SPINE FINDINGS Segmentation: The lowest lumbar type non-rib-bearing vertebra is labeled as L5. Alignment: No vertebral subluxation is observed. Vertebrae: Stable vertebral augmentations at L1, L2, and L4 without progressive collapse at these levels. There is 4 mm of chronic posterior bony retropulsion of the L2 superior endplate level, unchanged from previous. No new lumbar fracture is identified. Paraspinal and other soft tissues: Aortoiliac atherosclerotic vascular disease. Disc levels: The posterior bony retropulsion from the L2 superior endplate level contributes to borderline central narrowing of the thecal sac. A disc bulge at the L4-5 level contributes to borderline central stenosis and potentially mild left subarticular lateral recess stenosis. IMPRESSION: 1. Upon comparison of the cervical, thoracic, and lumbar spine to prior MRI from 09/16/2019, there is been an interval 20% superior endplate compression fracture at T7 and an interval 30% superior and inferior endplate compression fracture at T8. The T7 superior endplate compression has some subtle associated sclerosis and is likely subacute; the T8 compression fracture is age indeterminate but more likely chronic. 2. Remote, stable compression fractures at T1, T4, T5, T6, T9, T11, T12, L1, L2, and L4. Of these, there are vertebral augmentations at T5, T11, T12, L1, L2, and L4. The appearance is compatible with osteoporosis. 3.  Aortic Atherosclerosis (ICD10-I70.0).  Coronary atherosclerosis. 4. 3 by 4 mm right upper lung perifissural nodule. No follow-up needed if patient is low-risk. Non-contrast chest CT can be considered in 12 months if patient is high-risk. This recommendation follows the consensus  statement: Guidelines for Management of Incidental Pulmonary Nodules Detected on CT Images: From the Fleischner Society 2017; Radiology 2017; 284:228-243. 5. Potential mild left subarticular lateral recess stenosis at L4-5 due to disc bulge. Electronically Signed   By: Van Clines M.D.   On: 09/15/2020 13:29   DG Chest Portable 1 View  Result Date: 09/15/2020 CLINICAL DATA:  Shortness of breath.  Upper back pain. EXAM: PORTABLE CHEST 1 VIEW COMPARISON:  Thoracic spine CT from 09/15/2020 and chest radiograph 09/16/2019 FINDINGS: Atherosclerotic calcification of the aortic arch. Subsegmental atelectasis or scarring along the lung bases. Multiple thoracic and lumbar spine compressions and vertebral augmentations as detailed on today's total spine CT. Bony demineralization noted. Likely old right lateral rib deformities for example involving the seventh and eighth ribs. No blunting of the costophrenic angles. IMPRESSION: 1. Bibasilar subsegmental atelectasis or scarring. 2.  Aortic Atherosclerosis (ICD10-I70.0). 3. Old right lateral rib deformities from healed fractures. Electronically Signed  By: Van Clines M.D.   On: 09/15/2020 14:15    ____________________________________________   PROCEDURES  Procedure(s) performed (including Critical Care):  Procedures   ____________________________________________   INITIAL IMPRESSION / ASSESSMENT AND PLAN / ED COURSE  MARILOU BARNFIELD was evaluated in Emergency Department on 09/15/2020 for the symptoms described in the history of present illness. She was evaluated in the context of the global COVID-19 pandemic, which necessitated consideration that the patient might be at risk for infection with the SARS-CoV-2 virus that causes COVID-19. Institutional protocols and algorithms that pertain to the evaluation of patients at risk for COVID-19 are in a state of rapid change based on information released by regulatory bodies including the CDC and federal  and state organizations. These policies and algorithms were followed during the patient's care in the ED.    Patient comes in with back pain in the setting of known compression fractures previously.  Suspect this could be another compression fracture.  Patient is having a lot of pain even with moving around.  No evidence of cord compression or cauda equina.  We will start off with CT imaging to evaluate the spine.  We will give 1 dose of IV Dilaudid and IV Zofran to help with pain.  Our lower suspicion is related to her heart or her lungs given she is had previous compression fractures that have seemed similar.  Patient's labs show an elevated white count which she is typically always a little elevated and she is afebrile and no infectious symptoms.  Her cardiac marker is slightly elevated but similar to prior and her symptoms have been going on since yesterday so I do not think this is ACS.  Her EKG was reassuring.  No signs of any gallbladder or liver issues.  Patient CT scans do show compression fractures.  T7 appears may be old and T8 is age-indeterminate.  Patient does have tenderness along this area so I suspect that this was causing her pain.  We can discuss whether or not this could be a pulmonary embolism patient reports having multiple CTs to evaluate for PEs and they have all been negative and she states this is the same pain that she always gets from her compression fractures.  She has declined additional CT imaging at this time which I think is very reasonable due to the above.  EKG and cardiac markers were reassuring.  Patient did have some nausea and vomiting after the Dilaudid and was given some medications.  Patient stated that her pain that was well controlled and was wanting to go home.  I explained to patient that as long as she continues to not having nausea and vomiting and is able to tolerate some p.o.'s that she can go home when her friend got here.  Patient was prescribed some pain  medication and instructed to follow-up with her Ortho/pain doctor.  She denies any urinary symptoms       ____________________________________________   FINAL CLINICAL IMPRESSION(S) / ED DIAGNOSES   Final diagnoses:  Nontraumatic compression fracture of T8 vertebra, initial encounter [facility])      MEDICATIONS GIVEN DURING THIS VISIT:  Medications  HYDROmorphone (DILAUDID) injection 0.5 mg (0.5 mg Intravenous Given 09/15/20 1251)  ondansetron (ZOFRAN) injection 4 mg (4 mg Intravenous Given 09/15/20 1251)  acetaminophen (TYLENOL) tablet 1,000 mg (1,000 mg Oral Given 09/15/20 1251)  ondansetron (ZOFRAN) injection 4 mg (4 mg Intravenous Given 09/15/20 1347)  oxyCODONE (Oxy IR/ROXICODONE) immediate release tablet 2.5 mg (2.5 mg Oral Given  09/15/20 1532)  metoCLOPramide (REGLAN) injection 10 mg (10 mg Intravenous Given 09/15/20 1524)     ED Discharge Orders          Ordered    oxyCODONE (ROXICODONE) 5 MG immediate release tablet  Every 4 hours PRN        09/15/20 1539    ondansetron (ZOFRAN ODT) 4 MG disintegrating tablet  Every 8 hours PRN        09/15/20 1539    lidocaine (LIDODERM) 5 %  Every 12 hours        09/15/20 1539             Note:  This document was prepared using Dragon voice recognition software and may include unintentional dictation errors.    Vanessa [place], MD 09/16/20 1345

## 2020-09-15 NOTE — ED Notes (Signed)
Pt states middle back pain starting yesterday. Worse with movement.

## 2020-09-15 NOTE — ED Notes (Signed)
Unable to obtain signature for discharge as no signature pad is available. Reviewed DC instructions and pt verbalized understanding, noting that she will pick up prescriptions and reach out to PCP for f/u. Taken by wheelchair to exit where family was waiting.

## 2020-09-15 NOTE — ED Notes (Signed)
Pt is actively vomiting; sent message to MD requesting medication/eval.

## 2020-09-15 NOTE — ED Triage Notes (Signed)
Pt comes into the ED via POV c/o upper back pain.  Pt has h/o compression fractures and kyphoplasty.  Pt states her pain started up yesterday and she denies any known injury.  Pt does present uncomfortable in triage at this time.  Pt has significant back pain problems.

## 2020-09-16 NOTE — ED Notes (Signed)
Wasted 2.5mg  oxycodone IR tablet with RN Verlene Mayer as witness. Unable to access pt information in Pyxis for waste verification.

## 2020-11-13 ENCOUNTER — Other Ambulatory Visit: Payer: Self-pay

## 2020-11-13 ENCOUNTER — Inpatient Hospital Stay: Payer: Medicare Other | Attending: Oncology

## 2020-11-13 DIAGNOSIS — D509 Iron deficiency anemia, unspecified: Secondary | ICD-10-CM | POA: Diagnosis not present

## 2020-11-13 DIAGNOSIS — E538 Deficiency of other specified B group vitamins: Secondary | ICD-10-CM

## 2020-11-13 LAB — CBC WITH DIFFERENTIAL/PLATELET
Abs Immature Granulocytes: 0.13 10*3/uL — ABNORMAL HIGH (ref 0.00–0.07)
Basophils Absolute: 0.1 10*3/uL (ref 0.0–0.1)
Basophils Relative: 1 %
Eosinophils Absolute: 0 10*3/uL (ref 0.0–0.5)
Eosinophils Relative: 0 %
HCT: 39.2 % (ref 36.0–46.0)
Hemoglobin: 13 g/dL (ref 12.0–15.0)
Immature Granulocytes: 1 %
Lymphocytes Relative: 10 %
Lymphs Abs: 1.3 10*3/uL (ref 0.7–4.0)
MCH: 28.1 pg (ref 26.0–34.0)
MCHC: 33.2 g/dL (ref 30.0–36.0)
MCV: 84.7 fL (ref 80.0–100.0)
Monocytes Absolute: 0.5 10*3/uL (ref 0.1–1.0)
Monocytes Relative: 4 %
Neutro Abs: 11.1 10*3/uL — ABNORMAL HIGH (ref 1.7–7.7)
Neutrophils Relative %: 84 %
Platelets: 433 10*3/uL — ABNORMAL HIGH (ref 150–400)
RBC: 4.63 MIL/uL (ref 3.87–5.11)
RDW: 17.2 % — ABNORMAL HIGH (ref 11.5–15.5)
WBC: 13.1 10*3/uL — ABNORMAL HIGH (ref 4.0–10.5)
nRBC: 0 % (ref 0.0–0.2)

## 2020-11-13 LAB — COMPREHENSIVE METABOLIC PANEL
ALT: 13 U/L (ref 0–44)
AST: 20 U/L (ref 15–41)
Albumin: 3.8 g/dL (ref 3.5–5.0)
Alkaline Phosphatase: 68 U/L (ref 38–126)
Anion gap: 9 (ref 5–15)
BUN: 9 mg/dL (ref 8–23)
CO2: 26 mmol/L (ref 22–32)
Calcium: 9 mg/dL (ref 8.9–10.3)
Chloride: 96 mmol/L — ABNORMAL LOW (ref 98–111)
Creatinine, Ser: 0.7 mg/dL (ref 0.44–1.00)
GFR, Estimated: >60 mL/min (ref >60)
Glucose, Bld: 110 mg/dL — ABNORMAL HIGH (ref 70–99)
Potassium: 3.8 mmol/L (ref 3.5–5.1)
Sodium: 131 mmol/L — ABNORMAL LOW (ref 135–145)
Total Bilirubin: 0.3 mg/dL (ref 0.3–1.2)
Total Protein: 7 g/dL (ref 6.5–8.1)

## 2020-11-13 LAB — IRON AND TIBC
Iron: 32 ug/dL (ref 28–170)
Saturation Ratios: 10 % — ABNORMAL LOW (ref 10.4–31.8)
TIBC: 329 ug/dL (ref 250–450)
UIBC: 297 ug/dL

## 2020-11-13 LAB — FERRITIN: Ferritin: 76 ng/mL (ref 11–307)

## 2020-11-14 ENCOUNTER — Encounter: Payer: Self-pay | Admitting: Oncology

## 2020-11-14 ENCOUNTER — Inpatient Hospital Stay: Payer: Medicare Other | Attending: Oncology | Admitting: Oncology

## 2020-11-14 ENCOUNTER — Inpatient Hospital Stay: Payer: Medicare Other

## 2020-11-14 VITALS — BP 145/93 | HR 109 | Temp 97.7°F | Resp 16 | Wt 130.7 lb

## 2020-11-14 DIAGNOSIS — E538 Deficiency of other specified B group vitamins: Secondary | ICD-10-CM | POA: Insufficient documentation

## 2020-11-14 DIAGNOSIS — Z79899 Other long term (current) drug therapy: Secondary | ICD-10-CM | POA: Diagnosis not present

## 2020-11-14 DIAGNOSIS — D509 Iron deficiency anemia, unspecified: Secondary | ICD-10-CM | POA: Diagnosis present

## 2020-11-14 DIAGNOSIS — M069 Rheumatoid arthritis, unspecified: Secondary | ICD-10-CM | POA: Insufficient documentation

## 2020-11-14 DIAGNOSIS — Z87891 Personal history of nicotine dependence: Secondary | ICD-10-CM | POA: Diagnosis not present

## 2020-11-14 MED ORDER — SODIUM CHLORIDE 0.9 % IV SOLN: 200.0000 mg | Freq: Once | INTRAVENOUS | Status: DC

## 2020-11-14 MED ORDER — IRON SUCROSE 20 MG/ML IV SOLN
200.0000 mg | Freq: Once | INTRAVENOUS | Status: AC
  Administered 2020-11-14: 200 mg via INTRAVENOUS
  Filled 2020-11-14: qty 10

## 2020-11-14 MED ORDER — SODIUM CHLORIDE 0.9 % IV SOLN
INTRAVENOUS | Status: DC | PRN
  Filled 2020-11-14: qty 250

## 2020-11-14 NOTE — Progress Notes (Signed)
[facility]  [address] [place], [place] [address] Phone: [phone]  Fax: [phone]   Clinic Day:  11/14/2020  Referring physician: Kirk Ruths, MD  Chief Complaint: Ashley Mack is a 75 y.o. female with iron deficiency anemia and B12 deficiency  presents to follow-up.    INTERVAL HISTORY Ashley Mack is a 75 y.o. female who has above history reviewed by me today presents for follow up visit for management of anemia and B12 deficiency Problems and complaints are listed below: Patient previously followed up by Dr.Corcoran, patient switched care to me on 08/15/20 Extensive medical record review was performed by me  Chronic recurrent iron deficiency anemia   She had a colonoscopy on 03/18/2009. She has had several EGDs with the last on 10/12/2008.  She states that she has had polyps, reflux and a hiatal hernia.EGD on 12/09/2016 was normal, other than demonstrating a small hiatal hernia. Colonoscopy on 12/09/2016 demonstrated friability with contact bleeding in the proximal transverse colon. A 1 mm sessile polyp was found in the sigmoid colon, in addition to grade II non-bleeding internal hemorrhoids were also noted. Pathology results demonstrated a tubular adenoma that was negative for dyplasia or malignancy. 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   Patient has had a multiple IV Venofer treatments. She received Venofer on 09/08/2016, 09/15/2016, 09/25/2016, 11/26/2016, 12/16/2016, 12/24/2016, x3 (05/27/2017 - 06/10/2017), x2 (03/03/2018 - 03/10/2018), x3 (06/22/2018 - 07/06/2018), x 2 (08/31/2018 - 09/07/2018), x 4 (03/02/2019 - 03/23/2019), and x 3 (07/05/2019 - 07/20/2019).  She receives Venofer if her ferritin is < 30.   Vitamin B12 deficiency, She began weekly B12 on 09/08/2016 and continued x 6 weeks (last 10/16/2016). She began monthly B12 on 11/26/2016 (last 02/17/2018).  Patient used to get vitamin B12 prescription from Dr.  Mike Gip and her chiropractor injects for her.   She has rheumatoid arthritis.  She has been treated with methotrexate, Imuran, leflunomide, and sulfasalazine.  She is on prednisone 5 mg a day x 2 years.  She is s/p kyphoplasty x 5 since 04/2017.    INTERVAL HISTORY Ashley Mack is a 75 y.o. female who has above history reviewed by me today presents for follow up visit for management of vitamin B12 deficiency and iron deficiency anemia.  Problems and complaints are listed below: She reports feeling better after previous IV venofer treatments. Recently she feels energy level has decreased.   Review of Systems  Constitutional:  Positive for fatigue. Negative for appetite change and unexpected weight change.  HENT:   Negative for mouth sores.   Respiratory:  Negative for shortness of breath.   Cardiovascular:  Negative for chest pain.  Gastrointestinal:  Negative for abdominal pain and blood in stool.  Genitourinary:  Negative for dysuria.   Musculoskeletal:  Positive for arthralgias and back pain.  Neurological:  Negative for headaches.  Psychiatric/Behavioral:  Negative for confusion.     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)    Iron (Fe) deficiency anemia    Migraine headache    in past  Migraines    Motion sickness    No natural teeth    Osteoporosis    Rheumatoid arthritis ([place])    Vertigo     Past Surgical History:  Procedure Laterality Date   CATARACT EXTRACTION W/PHACO Right 08/12/2016   Procedure: CATARACT EXTRACTION PHACO AND INTRAOCULAR LENS PLACEMENT ([place])  Right;  Surgeon: Leandrew Koyanagi, MD;  Location: [place];   Service: Ophthalmology;  Laterality: Right;   CATARACT EXTRACTION W/PHACO Left 09/30/2016   Procedure: CATARACT EXTRACTION PHACO AND INTRAOCULAR LENS PLACEMENT ([place]) Left;  Surgeon: Leandrew Koyanagi, MD;  Location: [place];  Service: Ophthalmology;  Laterality: Left;   COLONOSCOPY     COLONOSCOPY WITH PROPOFOL N/A 12/09/2016   Procedure: COLONOSCOPY WITH PROPOFOL;  Surgeon: Toledo, Benay Pike, MD;  Location: [facility];  Service: Endoscopy;  Laterality: N/A;   ESOPHAGOGASTRODUODENOSCOPY     ESOPHAGOGASTRODUODENOSCOPY (EGD) WITH PROPOFOL N/A 12/09/2016   Procedure: ESOPHAGOGASTRODUODENOSCOPY (EGD) WITH PROPOFOL;  Surgeon: Toledo, Benay Pike, MD;  Location: [facility];  Service: Endoscopy;  Laterality: N/A;   KYPHOPLASTY N/A 08/10/2017   Procedure: Hewitt Shorts;  Surgeon: Hessie Knows, MD;  Location: [facility];  Service: Orthopedics;  Laterality: N/A;   KYPHOPLASTY N/A 09/09/2017   Procedure: [id number];  Surgeon: Hessie Knows, MD;  Location: [facility];  Service: Orthopedics;  Laterality: N/A;   KYPHOPLASTY N/A 09/28/2017   Procedure: [id number];  Surgeon: Hessie Knows, MD;  Location: [facility];  Service: Orthopedics;  Laterality: N/A;   KYPHOPLASTY N/A 06/19/2019   Procedure: KYPHOPLASTY T5;  Surgeon: Hessie Knows, MD;  Location: [facility];  Service: Orthopedics;  Laterality: N/A;   OOPHORECTOMY Bilateral 2003   RIGHT/LEFT HEART CATH AND CORONARY ANGIOGRAPHY Bilateral 10/19/2019   Procedure: RIGHT/LEFT HEART CATH AND CORONARY ANGIOGRAPHY;  Surgeon: Yolonda Kida, MD;  Location: [facility] [facility];  Service: Cardiovascular;  Laterality: Bilateral;    Family History  Problem Relation Age of Onset   Kidney disease Mother    Diabetes Mellitus II Brother     Social History:  reports that she quit smoking about 13 years ago. Her smoking use included cigarettes. She smoked an average of 1 pack per day. She has never used smokeless tobacco. She reports that she  does not drink alcohol and does not use drugs. She quit smoking in 2009.  She lives in [place]. Her step daughter is Museum/gallery conservator. The patient is accompanied by Ashley Mack today.  Allergies:  Allergies  Allergen Reactions   Methotrexate Derivatives Nausea And Vomiting and Other (See Comments)    Flu like symptoms   Other Nausea Only and Other (See Comments)    Arthritis medications . Unsure of what the med was.   Ranitidine Hives   Sulfasalazine Other (See Comments)    Stomach upset   Topiramate Nausea Only    Current Medications: Current Outpatient Medications  Medication Sig Dispense Refill   albuterol (VENTOLIN HFA) 108 (90 Base) MCG/ACT inhaler Inhale 2 puffs into the lungs every 6 (six) hours as needed for wheezing or shortness of breath.      budesonide-formoterol (SYMBICORT) 160-4.5 MCG/ACT inhaler Inhale into the lungs.     Cholecalciferol (VITAMIN D3) 2000 UNITS capsule Take 2,000 Units by mouth daily.      cyanocobalamin (,VITAMIN B-12,) 1000 MCG/ML injection Inject 1 mL (1,000 mcg total) into the muscle every 30 (thirty) days. 10 each 0   cyclobenzaprine (FLEXERIL) 5 MG tablet Take by mouth.     DEXILANT 60 MG capsule Take 60 mg by mouth daily.  Fe Fum-FePoly-Vit C-Vit B3 (INTEGRA) 62.5-62.5-40-3 MG CAPS Take 1 capsule by mouth daily.     fluticasone (FLONASE) 50 MCG/ACT nasal spray Place into the nose.     Ginger, Zingiber officinalis, (GINGER PO) Take by mouth.     ondansetron (ZOFRAN ODT) 4 MG disintegrating tablet Take 1 tablet (4 mg total) by mouth every 8 (eight) hours as needed for nausea or vomiting. 20 tablet 0   predniSONE (DELTASONE) 5 MG tablet Take 5 mg by mouth daily.      Saline (SIMPLY SALINE) 0.9 % AERS Place 1 spray into the nose as needed (congestion).      HYDROcodone-acetaminophen (NORCO/VICODIN) 5-325 MG tablet Take by mouth. (Patient not taking: No sig reported)     No current facility-administered medications for this visit.      Performance status  (ECOG): 1  Vitals Blood pressure (!) 145/93, pulse (!) 109, temperature 97.7 F (36.5 C), resp. rate 16, weight 130 lb 11.7 oz (59.3 kg), SpO2 95 %.   Physical Exam Vitals and nursing note reviewed.  Constitutional:      General: She is not in acute distress.    Appearance: She is well-developed. She is not diaphoretic.  HENT:     Head: Normocephalic and atraumatic.     Mouth/Throat:     Pharynx: No oropharyngeal exudate.  Eyes:     General: No scleral icterus.    Pupils: Pupils are equal, round, and reactive to light.  Cardiovascular:     Rate and Rhythm: Normal rate and regular rhythm.     Heart sounds: Normal heart sounds. No murmur heard. Pulmonary:     Effort: Pulmonary effort is normal. No respiratory distress.     Breath sounds: No wheezing or rales.  Chest:     Chest wall: No tenderness.  Abdominal:     General: Bowel sounds are normal. There is no distension.     Palpations: Abdomen is soft. There is no mass.     Tenderness: There is no abdominal tenderness. There is no guarding or rebound.  Musculoskeletal:        General: No tenderness. Normal range of motion.     Cervical back: Normal range of motion and neck supple.  Lymphadenopathy:     Cervical: No cervical adenopathy.     Upper Body:     Right upper body: No supraclavicular adenopathy.     Left upper body: No supraclavicular adenopathy.  Skin:    General: Skin is warm and dry.     Comments: Fragile skin.  Neurological:     Mental Status: She is alert and oriented to person, place, and time. Mental status is at baseline.  Psychiatric:        Mood and Affect: Mood normal.   Appointment on 11/13/2020  Component Date Value Ref Range Status   Sodium 11/13/2020 131 (A) 135 - 145 mmol/L Final   Potassium 11/13/2020 3.8  3.5 - 5.1 mmol/L Final   Chloride 11/13/2020 96 (A) 98 - 111 mmol/L Final   CO2 11/13/2020 26  22 - 32 mmol/L Final   Glucose, Bld 11/13/2020 110 (A) 70 - 99 mg/dL Final   Glucose  reference range applies only to samples taken after fasting for at least 8 hours.   BUN 11/13/2020 9  8 - 23 mg/dL Final   Creatinine, Ser 11/13/2020 0.70  0.44 - 1.00 mg/dL Final   Calcium 11/13/2020 9.0  8.9 - 10.3 mg/dL Final   Total Protein 11/13/2020 7.0  6.5 - 8.1 g/dL Final   Albumin 11/13/2020 3.8  3.5 - 5.0 g/dL Final   AST 11/13/2020 20  15 - 41 U/L Final   ALT 11/13/2020 13  0 - 44 U/L Final   Alkaline Phosphatase 11/13/2020 68  38 - 126 U/L Final   Total Bilirubin 11/13/2020 0.3  0.3 - 1.2 mg/dL Final   GFR, Estimated 11/13/2020 >60  >60 mL/min Final   Comment: (NOTE) Calculated using the CKD-EPI Creatinine Equation (2021)    Anion gap 11/13/2020 9  5 - 15 Final   Performed at [facility], [address] [place] [address], [place], [place] [address]   Iron 11/13/2020 32  28 - 170 ug/dL Final   TIBC 11/13/2020 329  250 - 450 ug/dL Final   Saturation Ratios 11/13/2020 10 (A) 10.4 - 31.8 % Final   UIBC 11/13/2020 297  ug/dL Final   Performed at [facility], [address] [place], [place] [address]   Ferritin 11/13/2020 76  11 - 307 ng/mL Final   Performed at [facility], [address] [place], [place] [address]   WBC 11/13/2020 13.1 (A) 4.0 - 10.5 K/uL Final   RBC 11/13/2020 4.63  3.87 - 5.11 MIL/uL Final   Hemoglobin 11/13/2020 13.0  12.0 - 15.0 g/dL Final   HCT 11/13/2020 39.2  36.0 - 46.0 % Final   MCV 11/13/2020 84.7  80.0 - 100.0 fL Final   MCH 11/13/2020 28.1  26.0 - 34.0 pg Final   MCHC 11/13/2020 33.2  30.0 - 36.0 g/dL Final   RDW 11/13/2020 17.2 (A) 11.5 - 15.5 % Final   Platelets 11/13/2020 433 (A) 150 - 400 K/uL Final   nRBC 11/13/2020 0.0  0.0 - 0.2 % Final   Neutrophils Relative % 11/13/2020 84  % Final   Neutro Abs 11/13/2020 11.1 (A) 1.7 - 7.7 K/uL Final   Lymphocytes Relative 11/13/2020 10  % Final   Lymphs Abs 11/13/2020 1.3  0.7 - 4.0 K/uL Final   Monocytes Relative 11/13/2020 4  % Final   Monocytes Absolute 11/13/2020 0.5  0.1 -  1.0 K/uL Final   Eosinophils Relative 11/13/2020 0  % Final   Eosinophils Absolute 11/13/2020 0.0  0.0 - 0.5 K/uL Final   Basophils Relative 11/13/2020 1  % Final   Basophils Absolute 11/13/2020 0.1  0.0 - 0.1 K/uL Final   Immature Granulocytes 11/13/2020 1  % Final   Abs Immature Granulocytes 11/13/2020 0.13 (A) 0.00 - 0.07 K/uL Final   Performed at [facility], [address] [place], [place] [address]    Assessment and plan:   1. B12 deficiency   2. Iron deficiency anemia, unspecified iron deficiency anemia type    #Recurrent chronic iron deficiency anemia, Labs reviewed and discussed with patient. Hemoglobin has improved and normalized.  Iron panel has improved.  Still slightly low iron saturation. Recommend patient proceed with IV Venofer 200 mg x 1 today.   #  B12 deficiency Patient has been getting prescriptions from Dr. Mike Gip and have chiropractor administered for her. Discussed with patient that she can also come to cancer center to have vitamin B12 injections.  Patient prefers to keep it the same way and get prescription reviewed. B12 level 896.   RTC in 3 months for labs (CBC with diff, iron TIBC ferritin) + IV venofer  I discussed the assessment and treatment plan with the patient.  The patient was provided an opportunity to ask questions and all  were answered.  The patient agreed with the plan and demonstrated an understanding of the instructions.  The patient was advised to call back if the symptoms worsen or if the condition fails to improve as anticipated.  Earlie Server, MD, PhD Hematology Oncology [place] at [facility] 11/14/2020

## 2020-11-23 IMAGING — XA DG C-ARM 1-60 MIN
2 series · 2 of 2 positions shown · non-contrast
Comparison: none

CLINICAL DATA: Vertebral body fracture.  Kyphoplasty.

EXAM:
DG C-ARM 1-60 MIN
FLUOROSCOPY TIME:  Fluoroscopy Time:  1 minutes 36 seconds.
Radiation Exposure Index (if provided by the fluoroscopic device):
11.7 mGy

[Series 1: cont. · 1 of 1 slices shown (1 of 2)]
[im 1/1]
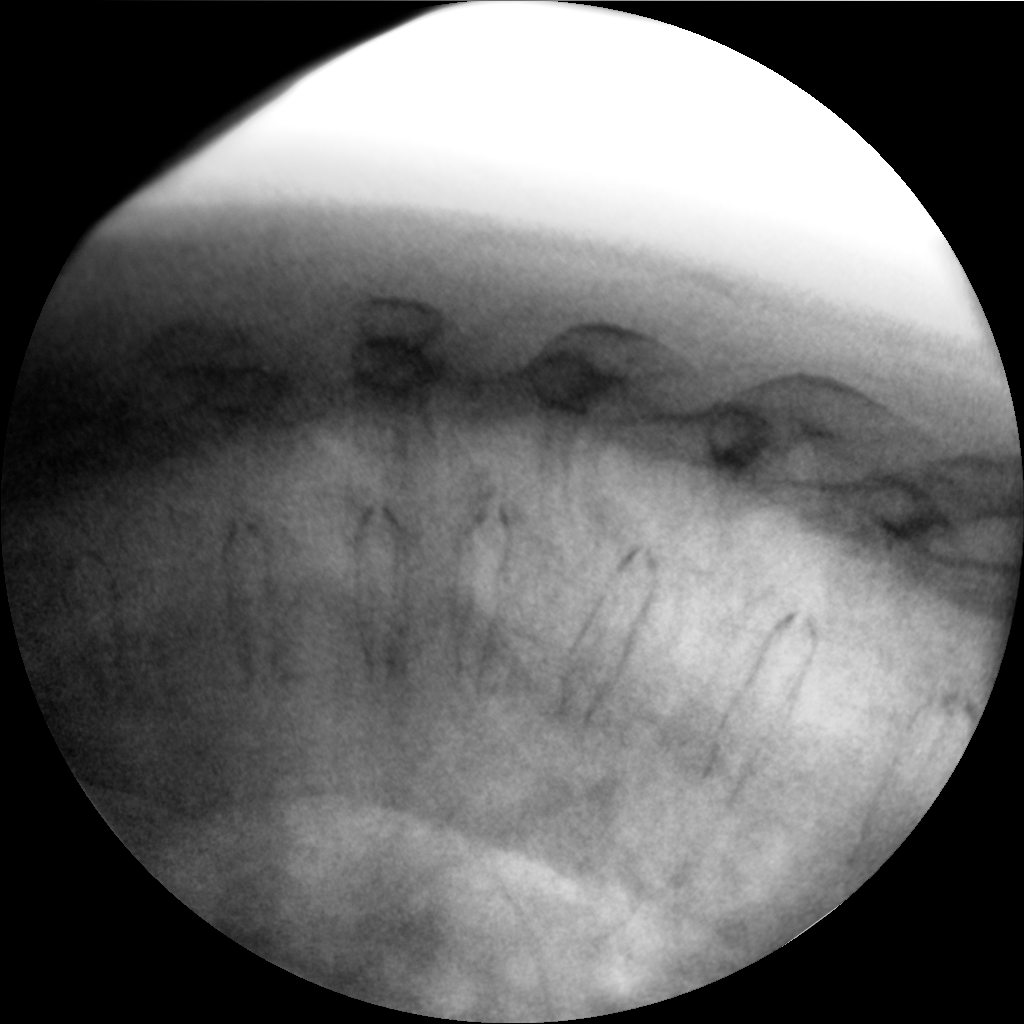

[Series 2: cont. · 1 of 1 slices shown (2 of 2)]
[im 1/1]
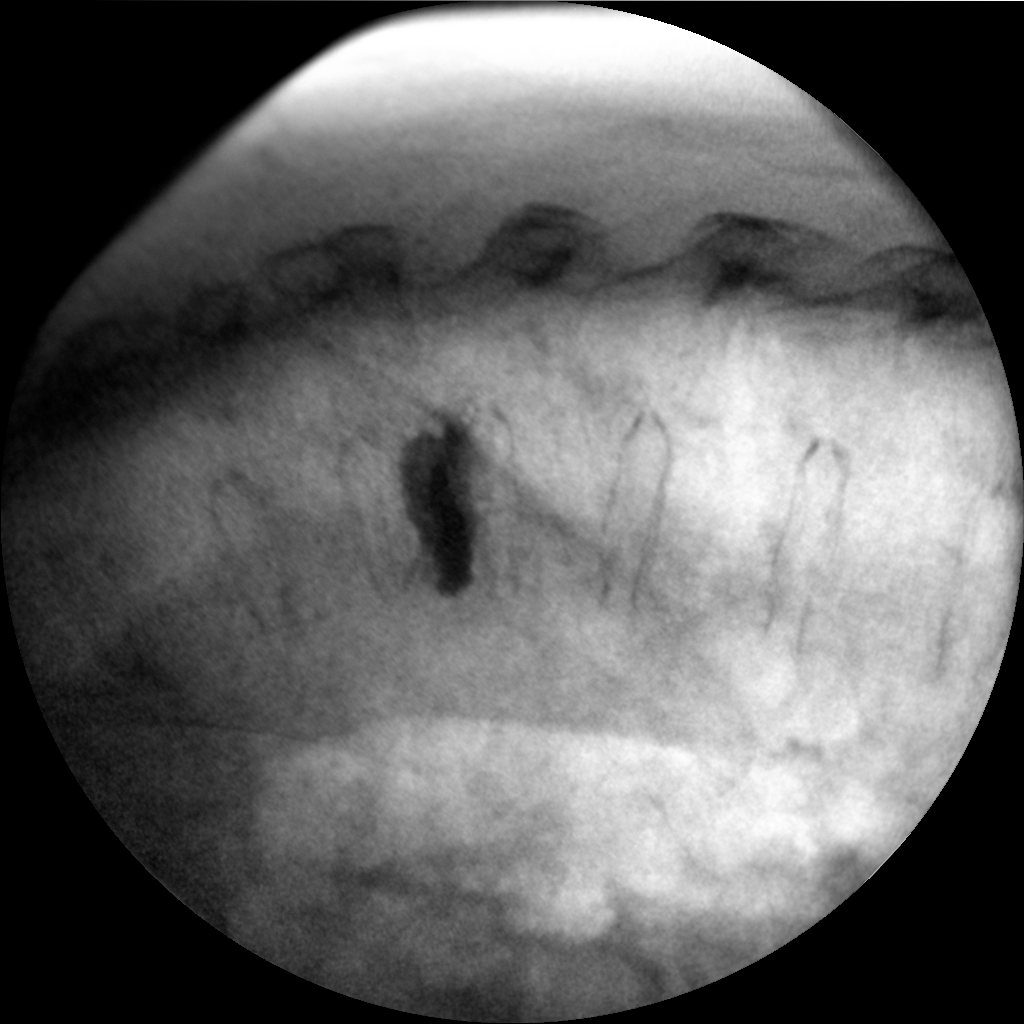

[2 of 2 positions shown; findings below may reference images not displayed]

IMPRESSION: C-arm imaging provided for kyphoplasty.

## 2021-02-04 ENCOUNTER — Telehealth: Payer: Self-pay | Admitting: Oncology

## 2021-02-04 NOTE — Telephone Encounter (Signed)
Caregiver called to change pt's date for her labs.wants to do 12-8. Please give her a call back at [phone]

## 2021-02-05 NOTE — Telephone Encounter (Signed)
Called number provided back. Little answered and stated that she needed to reschedule all of her appts. She will wait for her caregiver Amber to be in her presence before rescheduling appts.

## 2021-02-12 ENCOUNTER — Telehealth: Payer: Self-pay | Admitting: Oncology

## 2021-02-12 NOTE — Telephone Encounter (Signed)
Caregiver called and wants to speak with the nurse because she has some questions to ask. Please call caregiver at [phone] because pt gets confused. She may want to reschedule after talking with nurse.

## 2021-02-19 ENCOUNTER — Other Ambulatory Visit: Payer: Medicare Other

## 2021-02-20 ENCOUNTER — Telehealth: Payer: Self-pay | Admitting: Oncology

## 2021-02-20 ENCOUNTER — Ambulatory Visit: Payer: Medicare Other

## 2021-02-20 ENCOUNTER — Other Ambulatory Visit: Payer: Self-pay

## 2021-02-20 ENCOUNTER — Inpatient Hospital Stay: Payer: Medicare Other | Attending: Oncology

## 2021-02-20 ENCOUNTER — Other Ambulatory Visit: Payer: Medicare Other

## 2021-02-20 ENCOUNTER — Ambulatory Visit: Payer: Medicare Other | Admitting: Oncology

## 2021-02-20 DIAGNOSIS — D509 Iron deficiency anemia, unspecified: Secondary | ICD-10-CM | POA: Diagnosis not present

## 2021-02-20 LAB — IRON AND TIBC
Iron: 111 ug/dL (ref 28–170)
Saturation Ratios: 33 % — ABNORMAL HIGH (ref 10.4–31.8)
TIBC: 333 ug/dL (ref 250–450)
UIBC: 222 ug/dL

## 2021-02-20 LAB — CBC WITH DIFFERENTIAL/PLATELET
Abs Immature Granulocytes: 0.1 10*3/uL — ABNORMAL HIGH (ref 0.00–0.07)
Basophils Absolute: 0.1 10*3/uL (ref 0.0–0.1)
Basophils Relative: 1 %
Eosinophils Absolute: 0.1 10*3/uL (ref 0.0–0.5)
Eosinophils Relative: 1 %
HCT: 34.8 % — ABNORMAL LOW (ref 36.0–46.0)
Hemoglobin: 11.2 g/dL — ABNORMAL LOW (ref 12.0–15.0)
Immature Granulocytes: 1 %
Lymphocytes Relative: 12 %
Lymphs Abs: 1.8 10*3/uL (ref 0.7–4.0)
MCH: 28.1 pg (ref 26.0–34.0)
MCHC: 32.2 g/dL (ref 30.0–36.0)
MCV: 87.2 fL (ref 80.0–100.0)
Monocytes Absolute: 0.9 10*3/uL (ref 0.1–1.0)
Monocytes Relative: 6 %
Neutro Abs: 12.2 10*3/uL — ABNORMAL HIGH (ref 1.7–7.7)
Neutrophils Relative %: 79 %
Platelets: 425 10*3/uL — ABNORMAL HIGH (ref 150–400)
RBC: 3.99 MIL/uL (ref 3.87–5.11)
RDW: 14.9 % (ref 11.5–15.5)
WBC: 15.1 10*3/uL — ABNORMAL HIGH (ref 4.0–10.5)
nRBC: 0 % (ref 0.0–0.2)

## 2021-02-20 LAB — FERRITIN: Ferritin: 21 ng/mL (ref 11–307)

## 2021-02-20 NOTE — Telephone Encounter (Signed)
Caregiver returning your call. Please call back at [phone]

## 2021-02-24 ENCOUNTER — Ambulatory Visit: Payer: Medicare Other

## 2021-02-24 ENCOUNTER — Ambulatory Visit: Payer: Medicare Other | Admitting: Oncology

## 2021-02-27 ENCOUNTER — Inpatient Hospital Stay: Payer: Medicare Other | Attending: Oncology | Admitting: Oncology

## 2021-02-27 ENCOUNTER — Encounter: Payer: Self-pay | Admitting: Oncology

## 2021-02-27 ENCOUNTER — Telehealth: Payer: Self-pay

## 2021-02-27 ENCOUNTER — Other Ambulatory Visit: Payer: Self-pay

## 2021-02-27 ENCOUNTER — Inpatient Hospital Stay: Payer: Medicare Other

## 2021-02-27 VITALS — BP 145/82 | HR 108 | Temp 98.0°F | Wt 133.6 lb

## 2021-02-27 DIAGNOSIS — D509 Iron deficiency anemia, unspecified: Secondary | ICD-10-CM | POA: Diagnosis present

## 2021-02-27 DIAGNOSIS — Z87891 Personal history of nicotine dependence: Secondary | ICD-10-CM | POA: Diagnosis not present

## 2021-02-27 DIAGNOSIS — E538 Deficiency of other specified B group vitamins: Secondary | ICD-10-CM | POA: Diagnosis not present

## 2021-02-27 DIAGNOSIS — R5383 Other fatigue: Secondary | ICD-10-CM | POA: Diagnosis not present

## 2021-02-27 NOTE — Progress Notes (Signed)
Hematology oncology progress note   Clinic Day:  02/27/2021  Referring physician: Kirk Ruths, MD  Chief Complaint: Ashley Mack is a 75 y.o. female with iron deficiency anemia and B12 deficiency  presents to follow-up.    INTERVAL HISTORY Ashley Mack is a 75 y.o. female who has above history reviewed by me today presents for follow up visit for management of anemia and B12 deficiency Problems and complaints are listed below: Patient previously followed up by Dr.Corcoran, patient switched care to me on 08/15/20 Extensive medical record review was performed by me  Chronic recurrent iron deficiency anemia   She had a colonoscopy on 03/18/2009. She has had several EGDs with the last on 10/12/2008.  She states that she has had polyps, reflux and a hiatal hernia.EGD on 12/09/2016 was normal, other than demonstrating a small hiatal hernia. Colonoscopy on 12/09/2016 demonstrated friability with contact bleeding in the proximal transverse colon. A 1 mm sessile polyp was found in the sigmoid colon, in addition to grade II non-bleeding internal hemorrhoids were also noted. Pathology results demonstrated a tubular adenoma that was negative for dyplasia or malignancy. 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   Patient has had a multiple IV Venofer treatments. She received Venofer on 09/08/2016, 09/15/2016, 09/25/2016, 11/26/2016, 12/16/2016, 12/24/2016, x3 (05/27/2017 - 06/10/2017), x2 (03/03/2018 - 03/10/2018), x3 (06/22/2018 - 07/06/2018), x 2 (08/31/2018 - 09/07/2018), x 4 (03/02/2019 - 03/23/2019), and x 3 (07/05/2019 - 07/20/2019).  She receives Venofer if her ferritin is < 30.   Vitamin B12 deficiency, She began weekly B12 on 09/08/2016 and continued x 6 weeks (last 10/16/2016). She began monthly B12 on 11/26/2016 (last 02/17/2018).  Patient used to get vitamin B12 prescription from Dr. Mike Gip and her chiropractor injects for her.   She has rheumatoid arthritis.  She has been  treated with methotrexate, Imuran, leflunomide, and sulfasalazine.  She is on prednisone 5 mg a day x 2 years.  She is s/p kyphoplasty x 5 since 04/2017.    INTERVAL HISTORY Ashley Mack is a 76 y.o. female who has above history reviewed by me today presents for follow up visit for management of vitamin B12 deficiency and iron deficiency anemia.  Patient reports feeling extremely tired today.   Review of Systems  Constitutional:  Positive for fatigue. Negative for appetite change and unexpected weight change.  HENT:   Negative for mouth sores.   Respiratory:  Negative for shortness of breath.   Cardiovascular:  Negative for chest pain.  Gastrointestinal:  Negative for abdominal pain and blood in stool.  Genitourinary:  Negative for dysuria.   Musculoskeletal:  Positive for arthralgias and back pain.  Neurological:  Negative for headaches.  Psychiatric/Behavioral:  Negative for confusion.     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[facility])    Iron (Fe) deficiency anemia    Migraine headache    in past   Migraines    Motion sickness    No natural teeth    Osteoporosis    Rheumatoid arthritis (H[facility])    Vertigo  Past Surgical History:  Procedure Laterality Date   CATARACT EXTRACTION W/PHACO Right 08/12/2016   Procedure: CATARACT EXTRACTION PHACO AND INTRAOCULAR LENS PLACEMENT ([place])  Right;  Surgeon: Leandrew Koyanagi, MD;  Location: [place];  Service: Ophthalmology;  Laterality: Right;   CATARACT EXTRACTION W/PHACO Left 09/30/2016   Procedure: CATARACT EXTRACTION PHACO AND INTRAOCULAR LENS PLACEMENT ([place]) Left;  Surgeon: Leandrew Koyanagi, MD;  Location: [place];  Service: Ophthalmology;  Laterality: Left;   COLONOSCOPY     COLONOSCOPY WITH PROPOFOL N/A 12/09/2016   Procedure: COLONOSCOPY WITH PROPOFOL;  Surgeon: Toledo, Benay Pike, MD;  Location: [facility];  Service: Endoscopy;  Laterality: N/A;   ESOPHAGOGASTRODUODENOSCOPY     ESOPHAGOGASTRODUODENOSCOPY (EGD) WITH PROPOFOL N/A 12/09/2016   Procedure: ESOPHAGOGASTRODUODENOSCOPY (EGD) WITH PROPOFOL;  Surgeon: Toledo, Benay Pike, MD;  Location: [facility];  Service: Endoscopy;  Laterality: N/A;   KYPHOPLASTY N/A 08/10/2017   Procedure: Hewitt Shorts;  Surgeon: Hessie Knows, MD;  Location: [facility];  Service: Orthopedics;  Laterality: N/A;   KYPHOPLASTY N/A 09/09/2017   Procedure: [id number]-Z3,G9;  Surgeon: Hessie Knows, MD;  Location: [facility];  Service: Orthopedics;  Laterality: N/A;   KYPHOPLASTY N/A 09/28/2017   Procedure: [id number];  Surgeon: Hessie Knows, MD;  Location: [facility];  Service: Orthopedics;  Laterality: N/A;   KYPHOPLASTY N/A 06/19/2019   Procedure: KYPHOPLASTY T5;  Surgeon: Hessie Knows, MD;  Location: [facility];  Service: Orthopedics;  Laterality: N/A;   OOPHORECTOMY Bilateral 2003   RIGHT/LEFT HEART CATH AND CORONARY ANGIOGRAPHY Bilateral 10/19/2019   Procedure: RIGHT/LEFT HEART CATH AND CORONARY ANGIOGRAPHY;  Surgeon: Yolonda Kida, MD;  Location: [facility];  Service: Cardiovascular;  Laterality: Bilateral;    Family History  Problem Relation Age of Onset   Kidney disease Mother    Diabetes Mellitus II Brother     Social History:  reports that she quit smoking about 13 years ago. Her smoking use included cigarettes. She smoked an average of 1 pack per day. She has never used smokeless tobacco. She reports that she does not drink alcohol and does not use drugs. She quit smoking in 2009.  She lives in [place]. Her step daughter is Museum/gallery conservator. The patient is accompanied by [employer] today.  Allergies:  Allergies   Allergen Reactions   Methotrexate Derivatives Nausea And Vomiting and Other (See Comments)    Flu like symptoms   Other Nausea Only and Other (See Comments)    Arthritis medications . Unsure of what the med was.   Ranitidine Hives   Sulfasalazine Other (See Comments)    Stomach upset   Topiramate Nausea Only    Current Medications: Current Outpatient Medications  Medication Sig Dispense Refill   albuterol (VENTOLIN HFA) 108 (90 Base) MCG/ACT inhaler Inhale 2 puffs into the lungs every 6 (six) hours as needed for wheezing or shortness of breath.      Cholecalciferol (VITAMIN D3) 2000 UNITS capsule Take 2,000 Units by mouth daily.      cyanocobalamin (,VITAMIN B-12,) 1000 MCG/ML injection Inject 1 mL (1,000 mcg total) into the muscle every 30 (thirty) days. 10 each 0   cyclobenzaprine (FLEXERIL) 5 MG tablet Take by mouth.     DEXILANT 60 MG capsule Take 60 mg by mouth daily.      Fe Fum-FePoly-Vit C-Vit B3 (INTEGRA) 62.5-62.5-40-3 MG CAPS Take 1 capsule by mouth daily.     fluticasone (FLONASE) 50 MCG/ACT nasal spray Place into the nose.     Ginger, Zingiber officinalis, (GINGER PO) Take  by mouth.     ondansetron (ZOFRAN ODT) 4 MG disintegrating tablet Take 1 tablet (4 mg total) by mouth every 8 (eight) hours as needed for nausea or vomiting. 20 tablet 0   predniSONE (DELTASONE) 5 MG tablet Take 5 mg by mouth daily.      Saline (SIMPLY SALINE) 0.9 % AERS Place 1 spray into the nose as needed (congestion).      HYDROcodone-acetaminophen (NORCO/VICODIN) 5-325 MG tablet Take by mouth. (Patient not taking: Reported on 08/15/2020)     No current facility-administered medications for this visit.      Performance status (ECOG): 1  Vitals Blood pressure (!) 145/82, pulse (!) 108, temperature 98 F (36.7 C), temperature source Tympanic, weight 133 lb 9.6 oz (60.6 kg).   Physical Exam Vitals and nursing note reviewed.  Constitutional:      General: She is not in acute distress.     Appearance: She is well-developed. She is not diaphoretic.  HENT:     Head: Normocephalic and atraumatic.     Mouth/Throat:     Pharynx: No oropharyngeal exudate.  Eyes:     General: No scleral icterus.    Pupils: Pupils are equal, round, and reactive to light.  Cardiovascular:     Rate and Rhythm: Normal rate and regular rhythm.     Heart sounds: Normal heart sounds. No murmur heard. Pulmonary:     Effort: Pulmonary effort is normal. No respiratory distress.     Breath sounds: No wheezing or rales.  Chest:     Chest wall: No tenderness.  Abdominal:     General: Bowel sounds are normal. There is no distension.     Palpations: Abdomen is soft. There is no mass.     Tenderness: There is no abdominal tenderness. There is no guarding or rebound.  Musculoskeletal:        General: No tenderness. Normal range of motion.     Cervical back: Normal range of motion and neck supple.  Lymphadenopathy:     Cervical: No cervical adenopathy.     Upper Body:     Right upper body: No supraclavicular adenopathy.     Left upper body: No supraclavicular adenopathy.  Skin:    General: Skin is warm and dry.     Comments: Fragile skin.  Neurological:     Mental Status: She is alert and oriented to person, place, and time. Mental status is at baseline.  Psychiatric:        Mood and Affect: Mood normal.   No visits with results within 3 Day(s) from this visit.  Latest known visit with results is:  Appointment on 02/20/2021  Component Date Value Ref Range Status   Iron 02/20/2021 111  28 - 170 ug/dL Final   TIBC 02/20/2021 333  250 - 450 ug/dL Final   Saturation Ratios 02/20/2021 33 (H)  10.4 - 31.8 % Final   UIBC 02/20/2021 222  ug/dL Final   Performed at [facility], [address] [place], [place] [address]   Ferritin 02/20/2021 21  11 - 307 ng/mL Final   Performed at [facility], [address] [place], [place] [address]   WBC 02/20/2021 15.1 (H)  4.0 - 10.5 K/uL Final    RBC 02/20/2021 3.99  3.87 - 5.11 MIL/uL Final   Hemoglobin 02/20/2021 11.2 (L)  12.0 - 15.0 g/dL Final   HCT 02/20/2021 34.8 (L)  36.0 - 46.0 % Final   MCV 02/20/2021 87.2  80.0 -  100.0 fL Final   MCH 02/20/2021 28.1  26.0 - 34.0 pg Final   MCHC 02/20/2021 32.2  30.0 - 36.0 g/dL Final   RDW 02/20/2021 14.9  11.5 - 15.5 % Final   Platelets 02/20/2021 425 (H)  150 - 400 K/uL Final   nRBC 02/20/2021 0.0  0.0 - 0.2 % Final   Neutrophils Relative % 02/20/2021 79  % Final   Neutro Abs 02/20/2021 12.2 (H)  1.7 - 7.7 K/uL Final   Lymphocytes Relative 02/20/2021 12  % Final   Lymphs Abs 02/20/2021 1.8  0.7 - 4.0 K/uL Final   Monocytes Relative 02/20/2021 6  % Final   Monocytes Absolute 02/20/2021 0.9  0.1 - 1.0 K/uL Final   Eosinophils Relative 02/20/2021 1  % Final   Eosinophils Absolute 02/20/2021 0.1  0.0 - 0.5 K/uL Final   Basophils Relative 02/20/2021 1  % Final   Basophils Absolute 02/20/2021 0.1  0.0 - 0.1 K/uL Final   Immature Granulocytes 02/20/2021 1  % Final   Abs Immature Granulocytes 02/20/2021 0.10 (H)  0.00 - 0.07 K/uL Final   Performed at [facility], [address], [place], [place] [address]    Assessment and plan:   1. Iron deficiency anemia, unspecified iron deficiency anemia type   2. B12 deficiency   3. Fatigue, unspecified type    #Recurrent chronic iron deficiency anemia Labs reviewed and discussed with patient. 02/20/2021, iron saturation 33, TIBC 333, ferritin 21.  This is not typical for iron deficiency. Hemoglobin has slightly decreased to 11.2. Patient feels extremely fatigued.  The level for thickness is out of proportion to the level of anemia. I recommend additional lab work.  We will also repeat iron panel to rule out lab error. Plan to check CBC, smear, reticulocyte panel, B12, folate, TSH, free T4, iron TIBC ferritin.  Check CMP as well.  Patient wants to know if her insurance would cover her lab expense.  Discussed with patient that most  insurance will cover lab work if necessary.  However I am not able to guarantee that she will not have any out-of-pocket expense due to different insurance coverage.  Encourage patient to call her insurance to clarify.  Advised patient to call office if she desires to proceed with additional work-up.  #Vitamin B12 deficiency, patient is on vitamin B12  injection monthly.  Patient prefers to have her chiropractor admitting B12 for her.   RTC in 2 months for labs (CBC with diff, iron TIBC ferritin) NP + IV venofer  I discussed the assessment and treatment plan with the patient.  The patient was provided an opportunity to ask questions and all were answered.  The patient agreed with the plan and demonstrated an understanding of the instructions.  The patient was advised to call back if the symptoms worsen or if the condition fails to improve as anticipated.  Earlie Server, MD, PhD  02/27/2021

## 2021-02-27 NOTE — Telephone Encounter (Signed)
Patient in clinic today and Dr. Tasia Catchings wants repeat labwork but pt is hesitant because she wants to know if Medicare will cover. I reached out to [place] and per [facility], "as long as it is clinically necessary it should be covered, Medicare will not discuss anything with [place]. The patient will need to call Medicare directly if she has an other questions."    Attempted to call pt to let her know auth department response and ask her to call  insurance and verify if services will be covered. If so to give [place] a call back and schedule a lab appt. We are unable to tell pt exactly if insurance covers lab or if there will be a copay, pt will need to contact insurance.  attempted to call pt x3 and call can not go through, it is asking for a remote access code. unable to leave VM

## 2021-03-11 ENCOUNTER — Ambulatory Visit (INDEPENDENT_AMBULATORY_CARE_PROVIDER_SITE_OTHER): Payer: Medicare Other

## 2021-03-11 ENCOUNTER — Other Ambulatory Visit: Payer: Self-pay

## 2021-03-11 ENCOUNTER — Encounter: Payer: Self-pay | Admitting: Licensed Clinical Social Worker

## 2021-03-11 ENCOUNTER — Ambulatory Visit
Admission: EM | Admit: 2021-03-11 | Discharge: 2021-03-11 | Disposition: A | Discharge status: Home / Self Care | Payer: Medicare Other | Attending: Emergency Medicine | Admitting: Emergency Medicine

## 2021-03-11 DIAGNOSIS — R059 Cough, unspecified: Secondary | ICD-10-CM | POA: Diagnosis not present

## 2021-03-11 DIAGNOSIS — R0602 Shortness of breath: Secondary | ICD-10-CM | POA: Diagnosis not present

## 2021-03-11 DIAGNOSIS — J441 Chronic obstructive pulmonary disease with (acute) exacerbation: Secondary | ICD-10-CM

## 2021-03-11 DIAGNOSIS — J069 Acute upper respiratory infection, unspecified: Secondary | ICD-10-CM | POA: Diagnosis not present

## 2021-03-11 MED ORDER — PREDNISONE 20 MG PO TABS: 60.0000 mg | ORAL_TABLET | Freq: Every day | ORAL | 0 refills | Status: AC

## 2021-03-11 MED ORDER — LEVOFLOXACIN 500 MG PO TABS: 500.0000 mg | ORAL_TABLET | Freq: Every day | ORAL | 0 refills | Status: DC

## 2021-03-11 MED ORDER — BENZONATATE 100 MG PO CAPS: 200.0000 mg | ORAL_CAPSULE | Freq: Three times a day (TID) | ORAL | 0 refills | Status: DC

## 2021-03-11 MED ORDER — GUAIFENESIN-CODEINE 100-10 MG/5ML PO SYRP: 5.0000 mL | ORAL_SOLUTION | Freq: Three times a day (TID) | ORAL | 0 refills | Status: DC | PRN

## 2021-03-11 NOTE — ED Triage Notes (Signed)
Pt c/o sore throat and roof of mouth pain. Sxs started x 2 days. Nausea also started yesterday.

## 2021-03-11 NOTE — ED Provider Notes (Signed)
[facility]    CSN: [id number] Arrival date & time: 03/11/21  0906      History   Chief Complaint Chief Complaint  Patient presents with   Sore Throat   Nausea    HPI Ashley Mack is a 75 y.o. female.   HPI  75 year old female here for evaluation of respiratory complaints.  Patient reports that she has been experiencing nasal congestion with postnasal drip, sore throat with burning at her soft palate, cough that is positive for a bitter tasting mucus, wheezing, increased shortness of breath, and nausea.  She denies fever, vomiting, or diarrhea.  She states that she tossed and turned all night and because 2 of her ribs to move out of place.  She is attributing this to her increased shortness of breath.  She has had this before and typically gets adjusted by her chiropractor with resolution of symptoms.  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)    Iron (Fe) deficiency anemia    Migraine headache    in past   Migraines    Motion sickness    No natural teeth    Osteoporosis    Rheumatoid arthritis ([place])    Vertigo     Patient Active Problem List   Diagnosis Date Noted   Thoracic disc herniation 11/06/2019   Arthropathy of thoracic facet joint 11/06/2019   Cervical facet joint syndrome 11/06/2019   GERD (gastroesophageal reflux disease) 06/18/2019   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[place]) 06/18/2019   H/O kyphoplasty 06/18/2019   Compression fracture of body of thoracic vertebra (HCC) 06/16/2019   Flank pain    Intractable back pain 08/08/2017   Compression fracture of lumbar vertebra ([place]) 08/08/2017    Abdominal pain, RUQ 08/06/2017   Fatigue 05/29/2017   Exertional dyspnea 05/29/2017   Guaiac positive stools 11/26/2016   Iron deficiency anemia 09/01/2016   B12 deficiency 08/31/2016   Normocytic anemia 08/30/2016   Centrilobular emphysema ([place]) 05/09/2015   HTN (hypertension), benign 12/10/2013    Past Surgical History:  Procedure Laterality Date   CATARACT EXTRACTION W/PHACO Right 08/12/2016   Procedure: CATARACT EXTRACTION PHACO AND INTRAOCULAR LENS PLACEMENT ([place])  Right;  Surgeon: Leandrew Koyanagi, MD;  Location: [facility];  Service: Ophthalmology;  Laterality: Right;   CATARACT EXTRACTION W/PHACO Left 09/30/2016   Procedure: CATARACT EXTRACTION PHACO AND INTRAOCULAR LENS PLACEMENT ([place]) Left;  Surgeon: Leandrew Koyanagi, MD;  Location: [facility];  Service: Ophthalmology;  Laterality: Left;   COLONOSCOPY     COLONOSCOPY WITH PROPOFOL N/A 12/09/2016   Procedure: COLONOSCOPY WITH PROPOFOL;  Surgeon: Toledo, Benay Pike, MD;  Location: [facility];  Service: Endoscopy;  Laterality: N/A;   ESOPHAGOGASTRODUODENOSCOPY     ESOPHAGOGASTRODUODENOSCOPY (EGD) WITH PROPOFOL N/A 12/09/2016   Procedure: ESOPHAGOGASTRODUODENOSCOPY (EGD) WITH PROPOFOL;  Surgeon: Toledo, Benay Pike, MD;  Location: [facility];  Service: Endoscopy;  Laterality: N/A;   KYPHOPLASTY N/A 08/10/2017   Procedure: Hewitt Shorts;  Surgeon: Hessie Knows, MD;  Location: [facility];  Service: Orthopedics;  Laterality: N/A;   KYPHOPLASTY N/A 09/09/2017  Procedure: RCVELFYBOFB-P1,W2;  Surgeon: Hessie Knows, MD;  Location: [facility];  Service: Orthopedics;  Laterality: N/A;   KYPHOPLASTY N/A 09/28/2017   Procedure: HENIDPOEUMP-N36;  Surgeon: Hessie Knows, MD;  Location: [facility];  Service: Orthopedics;  Laterality: N/A;   KYPHOPLASTY N/A 06/19/2019   Procedure: KYPHOPLASTY T5;  Surgeon: Hessie Knows, MD;  Location: [facility];  Service: Orthopedics;  Laterality: N/A;   OOPHORECTOMY Bilateral 2003    RIGHT/LEFT HEART CATH AND CORONARY ANGIOGRAPHY Bilateral 10/19/2019   Procedure: RIGHT/LEFT HEART CATH AND CORONARY ANGIOGRAPHY;  Surgeon: Yolonda Kida, MD;  Location: [facility];  Service: Cardiovascular;  Laterality: Bilateral;    OB History     Gravida  1   Para      Term      Preterm      AB  1   Living         SAB      IAB      Ectopic      Multiple      Live Births               Home Medications    Prior to Admission medications   Medication Sig Start Date End Date Taking? Authorizing Provider  albuterol (VENTOLIN HFA) 108 (90 Base) MCG/ACT inhaler Inhale 2 puffs into the lungs every 6 (six) hours as needed for wheezing or shortness of breath.    Yes [provider]  benzonatate (TESSALON) 100 MG capsule Take 2 capsules (200 mg total) by mouth every 8 (eight) hours. 03/11/21  Yes Margarette Canada, NP  Cholecalciferol (VITAMIN D3) 2000 UNITS capsule Take 2,000 Units by mouth daily.    Yes [provider]  cyanocobalamin (,VITAMIN B-12,) 1000 MCG/ML injection Inject 1 mL (1,000 mcg total) into the muscle every 30 (thirty) days. 08/15/20  Yes Earlie Server, MD  cyclobenzaprine (FLEXERIL) 5 MG tablet Take by mouth. 10/29/20  Yes [provider]  DEXILANT 60 MG capsule Take 60 mg by mouth daily.  08/04/14  Yes [provider]  Fe Fum-FePoly-Vit C-Vit B3 (INTEGRA) 62.5-62.5-40-3 MG CAPS Take 1 capsule by mouth daily. 11/29/13  Yes [provider]  fluticasone (FLONASE) 50 MCG/ACT nasal spray Place into the nose.   Yes [provider]  Ginger, Zingiber officinalis, (GINGER PO) Take by mouth.   Yes [provider]  guaiFENesin-codeine (ROBITUSSIN AC) 100-10 MG/5ML syrup Take 5 mLs by mouth 3 (three) times daily as needed for cough. 03/11/21  Yes Margarette Canada, NP  HYDROcodone-acetaminophen (NORCO/VICODIN) 5-325 MG tablet Take by mouth. 09/22/19  Yes [provider]  levofloxacin (LEVAQUIN) 500 MG  tablet Take 1 tablet (500 mg total) by mouth daily. 03/11/21  Yes Margarette Canada, NP  ondansetron (ZOFRAN ODT) 4 MG disintegrating tablet Take 1 tablet (4 mg total) by mouth every 8 (eight) hours as needed for nausea or vomiting. 09/15/20  Yes Vanessa Bolivar, MD  predniSONE (DELTASONE) 20 MG tablet Take 3 tablets (60 mg total) by mouth daily with breakfast for 5 days. 3 tablets daily for 5 days. 03/11/21 03/16/21 Yes Margarette Canada, NP  predniSONE (DELTASONE) 5 MG tablet Take 5 mg by mouth daily.    Yes [provider]  Saline (SIMPLY SALINE) 0.9 % AERS Place 1 spray into the nose as needed (congestion).    Yes [provider]    Family History Family History  Problem Relation Age of Onset   Kidney disease Mother    Diabetes Mellitus II Brother  Social History Social History   Tobacco Use   Smoking status: Former    Packs/day: 1.00    Types: Cigarettes    Quit date: 2009    Years since quitting: 13.9   Smokeless tobacco: Never  Vaping Use   Vaping Use: Never used  Substance Use Topics   Alcohol use: No   Drug use: No     Allergies   Methotrexate derivatives, Other, Ranitidine, Sulfasalazine, and Topiramate   Review of Systems Review of Systems  Constitutional:  Negative for activity change and fever.  HENT:  Positive for congestion, postnasal drip and sore throat. Negative for rhinorrhea.   Respiratory:  Positive for cough, shortness of breath and wheezing.   Gastrointestinal:  Positive for nausea. Negative for diarrhea and vomiting.  Skin:  Negative for rash.  Hematological: Negative.   Psychiatric/Behavioral: Negative.      Physical Exam Triage Vital Signs ED Triage Vitals [03/11/21 0921]  Enc Vitals Group     BP      Pulse      Resp      Temp      Temp src      SpO2      Weight 133 lb 9.6 oz (60.6 kg)     Height 5\' 2"  (1.575 m)     Head Circumference      Peak Flow      Pain Score 7     Pain Loc      Pain Edu?      Excl. in [place]?    No  data found.  Updated Vital Signs BP (!) 147/100    Pulse (!) 122    Temp 98.4 F (36.9 C) (Oral)    Resp 18    Ht 5\' 2"  (1.575 m)    Wt 133 lb 9.6 oz (60.6 kg)    SpO2 97%    BMI 24.44 kg/m   Visual Acuity Right Eye Distance:   Left Eye Distance:   Bilateral Distance:    Right Eye Near:   Left Eye Near:    Bilateral Near:     Physical Exam Vitals and nursing note reviewed.  Constitutional:      General: She is not in acute distress.    Appearance: Normal appearance. She is ill-appearing.  HENT:     Head: Normocephalic and atraumatic.     Right Ear: Tympanic membrane, ear canal and external ear normal. There is no impacted cerumen.     Left Ear: Tympanic membrane, ear canal and external ear normal. There is no impacted cerumen.     Nose: Congestion present. No rhinorrhea.     Mouth/Throat:     Mouth: Mucous membranes are moist.     Pharynx: Oropharynx is clear. Posterior oropharyngeal erythema present. No oropharyngeal exudate.  Cardiovascular:     Rate and Rhythm: Normal rate and regular rhythm.     Pulses: Normal pulses.     Heart sounds: Normal heart sounds. No murmur heard.   No gallop.  Pulmonary:     Effort: Pulmonary effort is normal.     Breath sounds: Wheezing present.  Musculoskeletal:     Cervical back: Normal range of motion and neck supple.  Lymphadenopathy:     Cervical: No cervical adenopathy.  Skin:    General: Skin is warm and dry.     Capillary Refill: Capillary refill takes less than 2 seconds.     Findings: No erythema or rash.  Neurological:     General:  No focal deficit present.     Mental Status: She is alert and oriented to person, place, and time.  Psychiatric:        Mood and Affect: Mood normal.        Behavior: Behavior normal.        Thought Content: Thought content normal.        Judgment: Judgment normal.     UC Treatments / Results  Labs (all labs ordered are listed, but only abnormal results are displayed) Labs Reviewed - No  data to display  EKG   Radiology DG Chest 2 View  Result Date: 03/11/2021 CLINICAL DATA:  Shortness of breath, cough EXAM: CHEST - 2 VIEW COMPARISON:  09/15/2020 FINDINGS: Cardiac size is within normal limits. Thoracic aorta is tortuous and ectatic. Increase in AP diameter of chest suggests COPD. There are no signs of alveolar pulmonary edema or focal pulmonary consolidation. There is improvement in aeration of lower lung fields. There is no pleural effusion or pneumothorax. Osteopenia is seen in bony structures. There is decrease in height of multiple thoracic vertebral bodies. There is previous vertebroplasty in multiple thoracic vertebral bodies. IMPRESSION: COPD. There are no signs of pulmonary edema or focal pulmonary consolidation. Electronically Signed   By: Elmer Picker M.D.   On: 03/11/2021 09:43    Procedures Procedures (including critical care time)  Medications Ordered in UC Medications - No data to display  Initial Impression / Assessment and Plan / UC Course  I have reviewed the triage vital signs and the nursing notes.  Pertinent labs & imaging results that were available during my care of the patient were reviewed by me and considered in my medical decision making (see chart for details).  Patient is a pleasant though ill-appearing 75 year old female here for evaluation of sore throat, nasal congestion, and cough that has increased shortness of breath and wheezing.  As stated in HPI above the patient is attributing her increase shortness of breath to 2 ribs that have become out of place.  She has had this in the past and is typically adjusted by her chiropractor with resolution of symptoms.  Patient is mildly dyspneic but she is able to speak in full sentences.  Her physical exam reveals pearly gray tympanic membranes bilaterally with normal light reflex and clear external auditory canals.  Nasal mucosa is erythematous and edematous without any significant nasal  discharge.  Oropharyngeal exam reveals erythema of the soft palate and posterior oropharynx.  Tonsillar pillars are unremarkable.  Patient does have clear postnasal drip on exam.  No cervical lymphadenopathy appreciated on exam.  Patient's lung sounds are decreased throughout her lung fields.  In the right middle lobe there was a faint expiratory wheeze on exam.  Patient reports that she is coughing up purulent mucus and she has a history of COPD.  With her decreased lung sounds I will obtain a chest x-ray to look for possible pneumonia.  Plan is to discharge patient home on Atrovent nasal spray to help with the nasal congestion, Tessalon Perles, and Cheratussin cough syrup for patient to use at bedtime.  If there is pneumonia on exam we will treat with with dual antibiotic therapy.  Otherwise will discharge patient home with a diagnosis of COPD exacerbation and increase her prednisone dose to 60 mg daily for 5 days and then she can resume her normal 5 g daily dose.  We will also send patient home on antibiotics for COPD exacerbation.  Chest x-ray independently reviewed and  evaluated by me.  Impression: Questionable patchy infiltrate in the anterior aspect of the left lower lobe.  The remainder the lung spaces are well pneumatized.  There are multilevel kyphoplasties present on the lateral view.  Radiology overread is pending. Radiology impression is sitting no signs of pulmonary edema or focal pulmonary infiltrate.  There is improvement in aeration of the lower lung fields.  No pleural effusion or pneumothorax.  Increased AP diameter consistent with COPD.  Given the lack of pneumonia on chest x-ray I will treat patient for a COPD exacerbation and treat with Levaquin per up-to-date empiric antibacterial treatments COPD algorithm.  PDMP reviewed prior to prescription of Cheratussin cough syrup.   Final Clinical Impressions(s) / UC Diagnoses   Final diagnoses:  Upper respiratory tract infection, unspecified  type  COPD exacerbation [facility])     Discharge Instructions      Your chest x-ray today did not show any pneumonia but your symptoms are consistent with an upper respiratory infection, most likely viral and also a COPD exacerbation.  I Minna discharge her home on Levaquin and have you take 500 mg once daily for 7 days for empiric treatment for your COPD exacerbation.  I want you to hold your daily 5 mg of prednisone and take 60 mg daily for 5 days for treatment of pulmonary inflammation.  You can resume your normal daily dose after the 5-day course.  Use your albuterol inhaler, 1 to 2 puffs every 4-6 hours as needed for shortness of breath or wheezing.  Use the Atrovent nasal spray, 2 squirts up each nostril every 6 hours, help with nasal congestion and postnasal drip.  Use the Tessalon Perles every 8 hours during the day as needed for cough.  Taken with a small sip of water.  They may give you some numbness to the base of your tongue or metallic taste in her mouth, this is normal.  Use the Cheratussin cough syrup at bedtime as needed for cough, congestion, and sleep.  Return for reevaluation, or see your primary care provider, for continued or worsening symptoms.     ED Prescriptions     Medication Sig Dispense Auth. Provider   benzonatate (TESSALON) 100 MG capsule Take 2 capsules (200 mg total) by mouth every 8 (eight) hours. 21 capsule Margarette Canada, NP   guaiFENesin-codeine (ROBITUSSIN AC) 100-10 MG/5ML syrup Take 5 mLs by mouth 3 (three) times daily as needed for cough. 120 mL Margarette Canada, NP   levofloxacin (LEVAQUIN) 500 MG tablet Take 1 tablet (500 mg total) by mouth daily. 7 tablet Margarette Canada, NP   predniSONE (DELTASONE) 20 MG tablet Take 3 tablets (60 mg total) by mouth daily with breakfast for 5 days. 3 tablets daily for 5 days. 15 tablet Margarette Canada, NP      I have reviewed the PDMP during this encounter.   Margarette Canada, NP 03/11/21 [id number]

## 2021-03-11 NOTE — Discharge Instructions (Addendum)
Your chest x-ray today did not show any pneumonia but your symptoms are consistent with an upper respiratory infection, most likely viral and also a COPD exacerbation.  I Ashley Mack discharge her home on Levaquin and have you take 500 mg once daily for 7 days for empiric treatment for your COPD exacerbation.  I want you to hold your daily 5 mg of prednisone and take 60 mg daily for 5 days for treatment of pulmonary inflammation.  You can resume your normal daily dose after the 5-day course.  Use your albuterol inhaler, 1 to 2 puffs every 4-6 hours as needed for shortness of breath or wheezing.  Use the Atrovent nasal spray, 2 squirts up each nostril every 6 hours, help with nasal congestion and postnasal drip.  Use the Tessalon Perles every 8 hours during the day as needed for cough.  Taken with a small sip of water.  They may give you some numbness to the base of your tongue or metallic taste in her mouth, this is normal.  Use the Cheratussin cough syrup at bedtime as needed for cough, congestion, and sleep.  Return for reevaluation, or see your primary care provider, for continued or worsening symptoms.

## 2021-03-17 ENCOUNTER — Emergency Department: Payer: Medicare Other

## 2021-03-17 ENCOUNTER — Other Ambulatory Visit: Payer: Self-pay

## 2021-03-17 ENCOUNTER — Encounter: Payer: Self-pay | Admitting: Emergency Medicine

## 2021-03-17 DIAGNOSIS — Z841 Family history of disorders of kidney and ureter: Secondary | ICD-10-CM

## 2021-03-17 DIAGNOSIS — E871 Hypo-osmolality and hyponatremia: Secondary | ICD-10-CM | POA: Diagnosis present

## 2021-03-17 DIAGNOSIS — J9601 Acute respiratory failure with hypoxia: Secondary | ICD-10-CM | POA: Diagnosis present

## 2021-03-17 DIAGNOSIS — Z79891 Long term (current) use of opiate analgesic: Secondary | ICD-10-CM

## 2021-03-17 DIAGNOSIS — N179 Acute kidney failure, unspecified: Secondary | ICD-10-CM | POA: Diagnosis not present

## 2021-03-17 DIAGNOSIS — G43909 Migraine, unspecified, not intractable, without status migrainosus: Secondary | ICD-10-CM | POA: Diagnosis present

## 2021-03-17 DIAGNOSIS — Z66 Do not resuscitate: Secondary | ICD-10-CM | POA: Diagnosis not present

## 2021-03-17 DIAGNOSIS — D75839 Thrombocytosis, unspecified: Secondary | ICD-10-CM | POA: Diagnosis present

## 2021-03-17 DIAGNOSIS — M069 Rheumatoid arthritis, unspecified: Secondary | ICD-10-CM | POA: Diagnosis present

## 2021-03-17 DIAGNOSIS — I129 Hypertensive chronic kidney disease with stage 1 through stage 4 chronic kidney disease, or unspecified chronic kidney disease: Secondary | ICD-10-CM | POA: Diagnosis present

## 2021-03-17 DIAGNOSIS — N182 Chronic kidney disease, stage 2 (mild): Secondary | ICD-10-CM | POA: Diagnosis present

## 2021-03-17 DIAGNOSIS — H548 Legal blindness, as defined in USA: Secondary | ICD-10-CM | POA: Diagnosis present

## 2021-03-17 DIAGNOSIS — E878 Other disorders of electrolyte and fluid balance, not elsewhere classified: Secondary | ICD-10-CM | POA: Diagnosis present

## 2021-03-17 DIAGNOSIS — Z87891 Personal history of nicotine dependence: Secondary | ICD-10-CM

## 2021-03-17 DIAGNOSIS — Z20822 Contact with and (suspected) exposure to covid-19: Secondary | ICD-10-CM | POA: Diagnosis present

## 2021-03-17 DIAGNOSIS — K760 Fatty (change of) liver, not elsewhere classified: Secondary | ICD-10-CM | POA: Diagnosis present

## 2021-03-17 DIAGNOSIS — E861 Hypovolemia: Secondary | ICD-10-CM | POA: Diagnosis present

## 2021-03-17 DIAGNOSIS — E876 Hypokalemia: Secondary | ICD-10-CM | POA: Diagnosis present

## 2021-03-17 DIAGNOSIS — Z833 Family history of diabetes mellitus: Secondary | ICD-10-CM

## 2021-03-17 DIAGNOSIS — J101 Influenza due to other identified influenza virus with other respiratory manifestations: Secondary | ICD-10-CM | POA: Diagnosis not present

## 2021-03-17 DIAGNOSIS — D509 Iron deficiency anemia, unspecified: Secondary | ICD-10-CM | POA: Diagnosis present

## 2021-03-17 DIAGNOSIS — Z79899 Other long term (current) drug therapy: Secondary | ICD-10-CM

## 2021-03-17 DIAGNOSIS — Z888 Allergy status to other drugs, medicaments and biological substances status: Secondary | ICD-10-CM

## 2021-03-17 DIAGNOSIS — J432 Centrilobular emphysema: Secondary | ICD-10-CM | POA: Diagnosis present

## 2021-03-17 DIAGNOSIS — F419 Anxiety disorder, unspecified: Secondary | ICD-10-CM | POA: Diagnosis present

## 2021-03-17 DIAGNOSIS — H3500 Unspecified background retinopathy: Secondary | ICD-10-CM | POA: Diagnosis present

## 2021-03-17 DIAGNOSIS — K000 Anodontia: Secondary | ICD-10-CM | POA: Diagnosis present

## 2021-03-17 DIAGNOSIS — Z8616 Personal history of COVID-19: Secondary | ICD-10-CM

## 2021-03-17 DIAGNOSIS — T501X5A Adverse effect of loop [high-ceiling] diuretics, initial encounter: Secondary | ICD-10-CM | POA: Diagnosis not present

## 2021-03-17 DIAGNOSIS — Z7952 Long term (current) use of systemic steroids: Secondary | ICD-10-CM

## 2021-03-17 DIAGNOSIS — M810 Age-related osteoporosis without current pathological fracture: Secondary | ICD-10-CM | POA: Diagnosis present

## 2021-03-17 DIAGNOSIS — K219 Gastro-esophageal reflux disease without esophagitis: Secondary | ICD-10-CM | POA: Diagnosis present

## 2021-03-17 DIAGNOSIS — E538 Deficiency of other specified B group vitamins: Secondary | ICD-10-CM | POA: Diagnosis present

## 2021-03-17 DIAGNOSIS — T380X5A Adverse effect of glucocorticoids and synthetic analogues, initial encounter: Secondary | ICD-10-CM | POA: Diagnosis present

## 2021-03-17 DIAGNOSIS — J9811 Atelectasis: Secondary | ICD-10-CM | POA: Diagnosis present

## 2021-03-17 LAB — RESP PANEL BY RT-PCR (FLU A&B, COVID) ARPGX2
Influenza A by PCR: POSITIVE — AB
Influenza B by PCR: NEGATIVE
SARS Coronavirus 2 by RT PCR: NEGATIVE

## 2021-03-17 LAB — CBC WITH DIFFERENTIAL/PLATELET
Abs Immature Granulocytes: 0.21 10*3/uL — ABNORMAL HIGH (ref 0.00–0.07)
Basophils Absolute: 0 10*3/uL (ref 0.0–0.1)
Basophils Relative: 0 %
Eosinophils Absolute: 0 10*3/uL (ref 0.0–0.5)
Eosinophils Relative: 0 %
HCT: 38.3 % (ref 36.0–46.0)
Hemoglobin: 12.2 g/dL (ref 12.0–15.0)
Immature Granulocytes: 2 %
Lymphocytes Relative: 6 %
Lymphs Abs: 0.7 10*3/uL (ref 0.7–4.0)
MCH: 27.7 pg (ref 26.0–34.0)
MCHC: 31.9 g/dL (ref 30.0–36.0)
MCV: 87 fL (ref 80.0–100.0)
Monocytes Absolute: 0.7 10*3/uL (ref 0.1–1.0)
Monocytes Relative: 6 %
Neutro Abs: 9.3 10*3/uL — ABNORMAL HIGH (ref 1.7–7.7)
Neutrophils Relative %: 86 %
Platelets: 368 10*3/uL (ref 150–400)
RBC: 4.4 MIL/uL (ref 3.87–5.11)
RDW: 15.9 % — ABNORMAL HIGH (ref 11.5–15.5)
WBC: 10.9 10*3/uL — ABNORMAL HIGH (ref 4.0–10.5)
nRBC: 0 % (ref 0.0–0.2)

## 2021-03-17 LAB — COMPREHENSIVE METABOLIC PANEL
ALT: 19 U/L (ref 0–44)
AST: 22 U/L (ref 15–41)
Albumin: 3.1 g/dL — ABNORMAL LOW (ref 3.5–5.0)
Alkaline Phosphatase: 54 U/L (ref 38–126)
Anion gap: 10 (ref 5–15)
BUN: 19 mg/dL (ref 8–23)
CO2: 27 mmol/L (ref 22–32)
Calcium: 8.5 mg/dL — ABNORMAL LOW (ref 8.9–10.3)
Chloride: 91 mmol/L — ABNORMAL LOW (ref 98–111)
Creatinine, Ser: 0.89 mg/dL (ref 0.44–1.00)
GFR, Estimated: >60 mL/min (ref >60)
Glucose, Bld: 111 mg/dL — ABNORMAL HIGH (ref 70–99)
Potassium: 4.6 mmol/L (ref 3.5–5.1)
Sodium: 128 mmol/L — ABNORMAL LOW (ref 135–145)
Total Bilirubin: 0.5 mg/dL (ref 0.3–1.2)
Total Protein: 6.5 g/dL (ref 6.5–8.1)

## 2021-03-17 LAB — TROPONIN I (HIGH SENSITIVITY)
Troponin I (High Sensitivity): 8 ng/L (ref <18)
Troponin I (High Sensitivity): 8 ng/L (ref <18)

## 2021-03-17 NOTE — ED Provider Notes (Signed)
°  Emergency Medicine Provider Triage Evaluation Note  Ashley Mack , a 76 y.o.female,  was evaluated in triage.  Pt complains of shortness of breath for the past week.  Recently seen at [facility] urgent care, where they gave her antibiotics and steroids.  Since then, she has only gotten worse.  Her SPO2 on room air is 88%.  She does not normally wear oxygen at home.   Review of Systems  Positive: Shortness of breath Negative: Denies fever, chest pain, vomiting  Physical Exam   Vitals:   03/17/21 1443  BP: (!) 150/83  Pulse: (!) 110  Resp: 20  SpO2: 95%   Gen:   Awake, no distress   Resp:  Labored breathing MSK:   Moves extremities without difficulty  Other:    Medical Decision Making  Given the patient's initial medical screening exam, the following diagnostic evaluation has been ordered. The patient will be placed in the appropriate treatment space, once one is available, to complete the evaluation and treatment. I have discussed the plan of care with the patient and I have advised the patient that an ED physician or mid-level practitioner will reevaluate their condition after the test results have been received, as the results may give them additional insight into the type of treatment they may need.    Diagnostics: Labs, EKG, CXR, respiratory panel  Treatments: none immediately   Teodoro Spray, PA 03/17/21 1448    Duffy Bruce, MD 03/17/21 1719

## 2021-03-17 NOTE — ED Triage Notes (Signed)
Pt via POV from home. Pt c/o SOB for the past week or so, pt was seen at [facility] but states she does not feel any better. Pt also states she is more weak that normal. Pt has a hx of COPD and bronchitis. Pt RA sat 89%, pt placed on 2L .  Pt is A&Ox4 and NAD.

## 2021-03-18 ENCOUNTER — Encounter: Payer: Self-pay | Admitting: Radiology

## 2021-03-18 ENCOUNTER — Emergency Department: Payer: Medicare Other

## 2021-03-18 ENCOUNTER — Inpatient Hospital Stay
Admission: EM | Admit: 2021-03-18 | Discharge: 2021-04-01 | DRG: 193 | Disposition: A | Discharge status: Home Health | Payer: Medicare Other | Attending: Internal Medicine | Admitting: Internal Medicine

## 2021-03-18 DIAGNOSIS — J101 Influenza due to other identified influenza virus with other respiratory manifestations: Secondary | ICD-10-CM

## 2021-03-18 DIAGNOSIS — E876 Hypokalemia: Secondary | ICD-10-CM | POA: Diagnosis present

## 2021-03-18 DIAGNOSIS — K760 Fatty (change of) liver, not elsewhere classified: Secondary | ICD-10-CM | POA: Diagnosis present

## 2021-03-18 DIAGNOSIS — J9601 Acute respiratory failure with hypoxia: Secondary | ICD-10-CM | POA: Diagnosis present

## 2021-03-18 DIAGNOSIS — R0600 Dyspnea, unspecified: Secondary | ICD-10-CM

## 2021-03-18 DIAGNOSIS — J441 Chronic obstructive pulmonary disease with (acute) exacerbation: Secondary | ICD-10-CM | POA: Diagnosis not present

## 2021-03-18 DIAGNOSIS — J432 Centrilobular emphysema: Secondary | ICD-10-CM | POA: Diagnosis present

## 2021-03-18 DIAGNOSIS — R918 Other nonspecific abnormal finding of lung field: Secondary | ICD-10-CM

## 2021-03-18 DIAGNOSIS — E871 Hypo-osmolality and hyponatremia: Secondary | ICD-10-CM

## 2021-03-18 DIAGNOSIS — I82409 Acute embolism and thrombosis of unspecified deep veins of unspecified lower extremity: Secondary | ICD-10-CM

## 2021-03-18 DIAGNOSIS — D509 Iron deficiency anemia, unspecified: Secondary | ICD-10-CM | POA: Diagnosis present

## 2021-03-18 DIAGNOSIS — I129 Hypertensive chronic kidney disease with stage 1 through stage 4 chronic kidney disease, or unspecified chronic kidney disease: Secondary | ICD-10-CM | POA: Diagnosis present

## 2021-03-18 DIAGNOSIS — R0602 Shortness of breath: Secondary | ICD-10-CM

## 2021-03-18 DIAGNOSIS — E538 Deficiency of other specified B group vitamins: Secondary | ICD-10-CM | POA: Diagnosis present

## 2021-03-18 DIAGNOSIS — Z66 Do not resuscitate: Secondary | ICD-10-CM | POA: Diagnosis not present

## 2021-03-18 DIAGNOSIS — Z515 Encounter for palliative care: Secondary | ICD-10-CM | POA: Diagnosis not present

## 2021-03-18 DIAGNOSIS — Z8616 Personal history of COVID-19: Secondary | ICD-10-CM | POA: Diagnosis not present

## 2021-03-18 DIAGNOSIS — E878 Other disorders of electrolyte and fluid balance, not elsewhere classified: Secondary | ICD-10-CM | POA: Diagnosis present

## 2021-03-18 DIAGNOSIS — F419 Anxiety disorder, unspecified: Secondary | ICD-10-CM | POA: Diagnosis present

## 2021-03-18 DIAGNOSIS — H548 Legal blindness, as defined in USA: Secondary | ICD-10-CM | POA: Diagnosis present

## 2021-03-18 DIAGNOSIS — J810 Acute pulmonary edema: Secondary | ICD-10-CM | POA: Diagnosis not present

## 2021-03-18 DIAGNOSIS — N179 Acute kidney failure, unspecified: Secondary | ICD-10-CM | POA: Diagnosis not present

## 2021-03-18 DIAGNOSIS — G43909 Migraine, unspecified, not intractable, without status migrainosus: Secondary | ICD-10-CM | POA: Diagnosis present

## 2021-03-18 DIAGNOSIS — K219 Gastro-esophageal reflux disease without esophagitis: Secondary | ICD-10-CM | POA: Diagnosis present

## 2021-03-18 DIAGNOSIS — E861 Hypovolemia: Secondary | ICD-10-CM | POA: Diagnosis present

## 2021-03-18 DIAGNOSIS — N182 Chronic kidney disease, stage 2 (mild): Secondary | ICD-10-CM | POA: Diagnosis present

## 2021-03-18 DIAGNOSIS — M069 Rheumatoid arthritis, unspecified: Secondary | ICD-10-CM | POA: Diagnosis present

## 2021-03-18 DIAGNOSIS — J9811 Atelectasis: Secondary | ICD-10-CM

## 2021-03-18 DIAGNOSIS — D75839 Thrombocytosis, unspecified: Secondary | ICD-10-CM | POA: Diagnosis present

## 2021-03-18 DIAGNOSIS — Z20822 Contact with and (suspected) exposure to covid-19: Secondary | ICD-10-CM | POA: Diagnosis present

## 2021-03-18 DIAGNOSIS — M810 Age-related osteoporosis without current pathological fracture: Secondary | ICD-10-CM | POA: Diagnosis present

## 2021-03-18 LAB — CBC
HCT: 37.3 % (ref 36.0–46.0)
Hemoglobin: 12.1 g/dL (ref 12.0–15.0)
MCH: 28 pg (ref 26.0–34.0)
MCHC: 32.4 g/dL (ref 30.0–36.0)
MCV: 86.3 fL (ref 80.0–100.0)
Platelets: 342 10*3/uL (ref 150–400)
RBC: 4.32 MIL/uL (ref 3.87–5.11)
RDW: 15.9 % — ABNORMAL HIGH (ref 11.5–15.5)
WBC: 15.4 10*3/uL — ABNORMAL HIGH (ref 4.0–10.5)
nRBC: 0 % (ref 0.0–0.2)

## 2021-03-18 LAB — BASIC METABOLIC PANEL
Anion gap: 14 (ref 5–15)
BUN: 26 mg/dL — ABNORMAL HIGH (ref 8–23)
CO2: 25 mmol/L (ref 22–32)
Calcium: 8.4 mg/dL — ABNORMAL LOW (ref 8.9–10.3)
Chloride: 91 mmol/L — ABNORMAL LOW (ref 98–111)
Creatinine, Ser: 1.06 mg/dL — ABNORMAL HIGH (ref 0.44–1.00)
GFR, Estimated: 54 mL/min — ABNORMAL LOW (ref >60)
Glucose, Bld: 136 mg/dL — ABNORMAL HIGH (ref 70–99)
Potassium: 4.6 mmol/L (ref 3.5–5.1)
Sodium: 130 mmol/L — ABNORMAL LOW (ref 135–145)

## 2021-03-18 LAB — D-DIMER, QUANTITATIVE: D-Dimer, Quant: 6.44 ug/mL-FEU — ABNORMAL HIGH (ref 0.00–0.50)

## 2021-03-18 MED ORDER — HYDROCODONE-ACETAMINOPHEN 5-325 MG PO TABS: 1.0000 | ORAL_TABLET | ORAL | Status: DC | PRN

## 2021-03-18 MED ORDER — IOHEXOL 350 MG/ML SOLN
75.0000 mL | Freq: Once | INTRAVENOUS | Status: AC | PRN
  Administered 2021-03-18: 75 mL via INTRAVENOUS

## 2021-03-18 MED ORDER — SODIUM CHLORIDE 0.9 % IV SOLN: INTRAVENOUS | Status: DC

## 2021-03-18 MED ORDER — METHYLPREDNISOLONE SODIUM SUCC 125 MG IJ SOLR
INTRAMUSCULAR | Status: AC
  Administered 2021-03-18: 125 mg
  Filled 2021-03-18: qty 2

## 2021-03-18 MED ORDER — BENZONATATE 100 MG PO CAPS: 200.0000 mg | ORAL_CAPSULE | Freq: Three times a day (TID) | ORAL | Status: DC | PRN

## 2021-03-18 MED ORDER — OSELTAMIVIR PHOSPHATE 30 MG PO CAPS
30.0000 mg | ORAL_CAPSULE | Freq: Two times a day (BID) | ORAL | Status: AC
  Administered 2021-03-18 – 2021-03-23 (×10): 30 mg via ORAL
  Filled 2021-03-18 (×12): qty 1

## 2021-03-18 MED ORDER — OSELTAMIVIR PHOSPHATE 75 MG PO CAPS
75.0000 mg | ORAL_CAPSULE | Freq: Once | ORAL | Status: AC
  Administered 2021-03-18: 75 mg via ORAL
  Filled 2021-03-18: qty 1

## 2021-03-18 MED ORDER — FLUTICASONE PROPIONATE 50 MCG/ACT NA SUSP
2.0000 | Freq: Every day | NASAL | Status: DC | PRN
  Administered 2021-03-31: 2 via NASAL
  Filled 2021-03-18 (×2): qty 16

## 2021-03-18 MED ORDER — CYANOCOBALAMIN 1000 MCG/ML IJ SOLN
1000.0000 ug | INTRAMUSCULAR | Status: DC
  Filled 2021-03-18: qty 1

## 2021-03-18 MED ORDER — IPRATROPIUM-ALBUTEROL 0.5-2.5 (3) MG/3ML IN SOLN
3.0000 mL | Freq: Four times a day (QID) | RESPIRATORY_TRACT | Status: DC
  Administered 2021-03-18 – 2021-03-20 (×4): 3 mL via RESPIRATORY_TRACT
  Filled 2021-03-18 (×6): qty 3

## 2021-03-18 MED ORDER — VITAMIN D 25 MCG (1000 UNIT) PO TABS
2000.0000 [IU] | ORAL_TABLET | Freq: Every day | ORAL | Status: DC
  Administered 2021-03-20 – 2021-04-01 (×13): 2000 [IU] via ORAL
  Filled 2021-03-18 (×14): qty 2

## 2021-03-18 MED ORDER — HYDROCOD POLST-CPM POLST ER 10-8 MG/5ML PO SUER: 5.0000 mL | Freq: Two times a day (BID) | ORAL | Status: DC | PRN

## 2021-03-18 MED ORDER — MAGNESIUM HYDROXIDE 400 MG/5ML PO SUSP: 30.0000 mL | Freq: Every day | ORAL | Status: DC | PRN

## 2021-03-18 MED ORDER — ACETAMINOPHEN 650 MG RE SUPP: 650.0000 mg | Freq: Four times a day (QID) | RECTAL | Status: DC | PRN

## 2021-03-18 MED ORDER — ENOXAPARIN SODIUM 40 MG/0.4ML IJ SOSY
40.0000 mg | PREFILLED_SYRINGE | Freq: Every day | INTRAMUSCULAR | Status: DC
  Administered 2021-03-18 – 2021-04-01 (×15): 40 mg via SUBCUTANEOUS
  Filled 2021-03-18 (×15): qty 0.4

## 2021-03-18 MED ORDER — SALINE SPRAY 0.65 % NA SOLN
1.0000 | NASAL | Status: DC | PRN
  Filled 2021-03-18 (×2): qty 44

## 2021-03-18 MED ORDER — ONDANSETRON HCL 4 MG PO TABS
4.0000 mg | ORAL_TABLET | Freq: Four times a day (QID) | ORAL | Status: DC | PRN
  Administered 2021-03-31 – 2021-04-01 (×2): 4 mg via ORAL
  Filled 2021-03-18 (×2): qty 1

## 2021-03-18 MED ORDER — TRAZODONE HCL 50 MG PO TABS
25.0000 mg | ORAL_TABLET | Freq: Every evening | ORAL | Status: DC | PRN
  Administered 2021-03-22: 25 mg via ORAL
  Filled 2021-03-18 (×3): qty 1

## 2021-03-18 MED ORDER — PANTOPRAZOLE SODIUM 40 MG PO TBEC
40.0000 mg | DELAYED_RELEASE_TABLET | Freq: Every day | ORAL | Status: DC
  Administered 2021-03-18 – 2021-04-01 (×15): 40 mg via ORAL
  Filled 2021-03-18 (×15): qty 1

## 2021-03-18 MED ORDER — GUAIFENESIN ER 600 MG PO TB12
600.0000 mg | ORAL_TABLET | Freq: Two times a day (BID) | ORAL | Status: DC
  Administered 2021-03-18 – 2021-04-01 (×29): 600 mg via ORAL
  Filled 2021-03-18 (×29): qty 1

## 2021-03-18 MED ORDER — METHYLPREDNISOLONE SODIUM SUCC 40 MG IJ SOLR
40.0000 mg | Freq: Two times a day (BID) | INTRAMUSCULAR | Status: DC
  Administered 2021-03-18 – 2021-03-22 (×9): 40 mg via INTRAVENOUS
  Filled 2021-03-18 (×9): qty 1

## 2021-03-18 MED ORDER — CYCLOBENZAPRINE HCL 10 MG PO TABS
5.0000 mg | ORAL_TABLET | Freq: Three times a day (TID) | ORAL | Status: DC | PRN
  Administered 2021-03-22 – 2021-03-31 (×13): 5 mg via ORAL
  Filled 2021-03-18 (×14): qty 1

## 2021-03-18 MED ORDER — ACETAMINOPHEN 325 MG PO TABS
650.0000 mg | ORAL_TABLET | Freq: Four times a day (QID) | ORAL | Status: DC | PRN
  Administered 2021-03-26 (×2): 650 mg via ORAL
  Filled 2021-03-18 (×2): qty 2

## 2021-03-18 MED ORDER — IPRATROPIUM-ALBUTEROL 0.5-2.5 (3) MG/3ML IN SOLN
RESPIRATORY_TRACT | Status: AC
  Administered 2021-03-18: 9 mL
  Filled 2021-03-18: qty 9

## 2021-03-18 MED ORDER — SODIUM CHLORIDE 0.9 % IV SOLN
1.0000 g | INTRAVENOUS | Status: AC
  Administered 2021-03-18 – 2021-03-22 (×5): 1 g via INTRAVENOUS
  Filled 2021-03-18 (×3): qty 10
  Filled 2021-03-18: qty 1
  Filled 2021-03-18: qty 10

## 2021-03-18 MED ORDER — ONDANSETRON HCL 4 MG/2ML IJ SOLN
4.0000 mg | Freq: Four times a day (QID) | INTRAMUSCULAR | Status: DC | PRN
  Filled 2021-03-18 (×3): qty 2

## 2021-03-18 MED ORDER — GUAIFENESIN-CODEINE 100-10 MG/5ML PO SOLN: 5.0000 mL | Freq: Three times a day (TID) | ORAL | Status: DC | PRN

## 2021-03-18 MED ORDER — ADULT MULTIVITAMIN W/MINERALS CH
1.0000 | ORAL_TABLET | Freq: Every day | ORAL | Status: DC
  Administered 2021-03-18 – 2021-04-01 (×14): 1 via ORAL
  Filled 2021-03-18 (×14): qty 1

## 2021-03-18 MED ORDER — IPRATROPIUM-ALBUTEROL 20-100 MCG/ACT IN AERS
2.0000 | INHALATION_SPRAY | RESPIRATORY_TRACT | Status: DC | PRN
  Filled 2021-03-18: qty 4

## 2021-03-18 NOTE — ED Notes (Addendum)
Dr. Hal Hope notified of patients abdominal distention.  Per Dr. Hal Hope, in and out straight cath will be performed to see if distention improves.

## 2021-03-18 NOTE — ED Notes (Signed)
Patient transferred from bed to commode, high flow oxygen in place.  DOE noted.  Improves with rest, but does not resolve.  Transported patient from room 25 to 35 on 4L/ [place]... Tolerated transport, by dyspnea persisted.  Returned to high flow oxygen once in room 35, dyspnea improved.

## 2021-03-18 NOTE — Progress Notes (Signed)
Patient seen and examined.  Admitted early morning hours by nighttime hospitalist.  History physical reviewed.  Assessment plan reviewed.  Discussed with patient. In brief, 76 year old with hypertension, anxiety, GERD, migraine and COPD not on home oxygen presented with acute onset of worsening dyspnea with associated congestive productive cough and wheezing for 1 week.  Recently treated with Levaquin and prednisone taper.  In the emergency room blood pressure stable.  Heart rate 110.  95% on 2 L oxygen.  Subsequently required high flow nasal cannula oxygen.  Influenza A positive.  COVID-19 negative.  D-dimer was 6.44.  CTA with no evidence of PE.  Showed emphysema and compression fractures.  COPD with acute exacerbation due to acute influenza A infection: Agree with admission to monitored unit because of severity of symptoms. Aggressive bronchodilator therapy, IV steroids transition to oral prednisone. inhalational steroids, scheduled and as needed bronchodilators, deep breathing exercises, incentive spirometry, chest physiotherapy and respiratory therapy consult. Antibiotics due to severity of symptoms.  Started on Rocephin.  We will continue. Supplemental oxygen to keep saturations more than 89%. Mobilize once oxygen requirement improves.   Total time spent: 25 minutes.  No charge visit.  Same-day admission.

## 2021-03-18 NOTE — ED Notes (Signed)
Jamelle Haring (grandaughter) : [phone] Call with updates

## 2021-03-18 NOTE — ED Notes (Signed)
In and Out performed.  229ml urine in straight cath

## 2021-03-18 NOTE — ED Provider Notes (Signed)
[facility] Provider Note    Event Date/Time   First MD Initiated Contact with Patient 03/18/21 0127     (approximate)   History   Shortness of Breath   HPI  Ashley Mack is a 76 y.o. female with a history of former smoking, emphysema, rheumatoid arthritis, osteoporosis, anemia who presents for evaluation of shortness of breath.  Patient reports that she has been getting progressively worsening shortness of breath over the last week.  Went to urgent care when she started feeling sick and was started on prednisone and antibiotics.  Symptoms are not getting any better.  Patient is feeling very weak, has been wheezing, has a productive cough.  She denies body aches or fever.  She is complaining of tightness in her chest but denies any pleuritic chest pain.  Denies any personal or family history.  DVT, no recent travel immobilization, no leg pain or swelling, no hemoptysis or exogenous hormones.  No vomiting or diarrhea.  Patient has been using her inhalers at home without much relief.     Physical Exam   Triage Vital Signs: ED Triage Vitals  Enc Vitals Group     BP 03/17/21 1443 (!) 150/83     Pulse Rate 03/17/21 1443 (!) 110     Resp 03/17/21 1443 20     Temp 03/17/21 1835 97.9 F (36.6 C)     Temp Source 03/17/21 1835 Oral     SpO2 03/17/21 1443 95 %     Weight 03/17/21 1444 140 lb (63.5 kg)     Height 03/17/21 1444 5\' 2"  (1.575 m)     Head Circumference --      Peak Flow --      Pain Score 03/17/21 1444 0     Pain Loc --      Pain Edu? --      Excl. in [place]? --     Most recent vital signs: Vitals:   03/18/21 0300 03/18/21 0330  BP: 137/77 (!) 139/94  Pulse: (!) 110 (!) 108  Resp: 19 17  Temp:    SpO2: 97% 97%     General: Awake, no distress.  CV:  Tachycardic with regular rhythm, no murmurs, strong pulses x 4 Resp:  Increased work of breathing, tachypneic, retracting, wheezing bilaterally with decreased air movement Abd:  Soft, non  tender, non distended, positive bowel sounds, no rebound or guarding MSK:  No edema or cyanosis Neuro:  Normal speech, face is symmetric, moving all extremities with no gross focal neuro deficit   ED Results / Procedures / Treatments   Labs (all labs ordered are listed, but only abnormal results are displayed) Labs Reviewed  RESP PANEL BY RT-PCR (FLU A&B, COVID) ARPGX2 - Abnormal; Notable for the following components:      Result Value   Influenza A by PCR POSITIVE (*)    All other components within normal limits  COMPREHENSIVE METABOLIC PANEL - Abnormal; Notable for the following components:   Sodium 128 (*)    Chloride 91 (*)    Glucose, Bld 111 (*)    Calcium 8.5 (*)    Albumin 3.1 (*)    All other components within normal limits  CBC WITH DIFFERENTIAL/PLATELET - Abnormal; Notable for the following components:   WBC 10.9 (*)    RDW 15.9 (*)    Neutro Abs 9.3 (*)    Abs Immature Granulocytes 0.21 (*)    All other components within normal limits  D-DIMER, QUANTITATIVE (  NOT AT [facility]) - Abnormal; Notable for the following components:   D-Dimer, Quant 6.44 (*)    All other components within normal limits  URINALYSIS, ROUTINE W REFLEX MICROSCOPIC  D-DIMER, QUANTITATIVE  TROPONIN I (HIGH SENSITIVITY)  TROPONIN I (HIGH SENSITIVITY)     EKG  ED ECG REPORT I, Rudene Re, the attending physician, personally viewed and interpreted this ECG.  Sinus tachycardia at the rate of 110, no ST elevations or depressions.   RADIOLOGY I have personally reviewed the images performed during this visit and I agree with the Radiologist's read.   Interpretation by Radiologist:  DG Chest 2 View  Result Date: 03/17/2021 CLINICAL DATA:  Shortness of breath. EXAM: CHEST - 2 VIEW COMPARISON:  March 11, 2021. FINDINGS: The heart size and mediastinal contours are within normal limits. Mild bibasilar subsegmental atelectasis is noted. Small pleural effusions may be present. Status post  kyphoplasty at multiple levels of the thoracic spine. Multiple probable old compression fractures are noted as well. IMPRESSION: Mild bibasilar subsegmental atelectasis with small bilateral pleural effusions. Electronically Signed   By: Marijo Conception M.D.   On: 03/17/2021 15:36   CT Angio Chest PE W and/or Wo Contrast  Result Date: 03/18/2021 CLINICAL DATA:  Low oxygen saturation and elevated D-dimer. EXAM: CT ANGIOGRAPHY CHEST WITH CONTRAST TECHNIQUE: Multidetector CT imaging of the chest was performed using the standard protocol during bolus administration of intravenous contrast. Multiplanar CT image reconstructions and MIPs were obtained to evaluate the vascular anatomy. CONTRAST:  54mL OMNIPAQUE IOHEXOL 350 MG/ML SOLN COMPARISON:  June 16, 2019 FINDINGS: Cardiovascular: There is marked severity calcification of the thoracic aorta, without evidence of aortic aneurysm or dissection. Satisfactory opacification of the pulmonary arteries to the segmental level. No evidence of pulmonary embolism. Normal heart size. No pericardial effusion. Mediastinum/Nodes: No enlarged mediastinal, hilar, or axillary lymph nodes. Thyroid gland, trachea, and esophagus demonstrate no significant findings. Lungs/Pleura: There is mild central lobular emphysema. Mild atelectatic changes are seen within the posterior aspects of the bilateral lung bases, right greater than left. There is no evidence of a pleural effusion or pneumothorax. Upper Abdomen: A 1.8 cm x 1.1 cm focus of parenchymal low attenuation is seen within the posterior aspect of the left lobe of the liver. Musculoskeletal: Compression fracture deformities of indeterminate age are seen involving the T6, T7, T8 and T9 vertebral bodies. This is most severe at the level of T7, which represents a new finding when compared to the prior study. Evidence of prior vertebroplasty is noted at the levels of T5, T11, T12, L1 and L2. Review of the MIP images confirms the above  findings. IMPRESSION: 1. No evidence of pulmonary embolus. 2. Mild central lobular emphysema. 3. Compression fracture deformities of indeterminate age involving the T6, T7, T8 and T9 vertebral bodies, most severe at the level of T7. 4. Evidence of prior vertebroplasty at the levels of T5, T11, T12, L1 and L2. Aortic Atherosclerosis (ICD10-I70.0) and Emphysema (ICD10-J43.9). Electronically Signed   By: Virgina Norfolk M.D.   On: 03/18/2021 04:00      PROCEDURES:  Critical Care performed: Yes, see critical care procedure note(s)  .Critical Care Performed by: Rudene Re, MD Authorized by: Rudene Re, MD   Critical care provider statement:    Critical care time (minutes):  40   Critical care time was exclusive of:  Separately billable procedures and treating other patients   Critical care was necessary to treat or prevent imminent or life-threatening deterioration of the following conditions:  Respiratory failure, sepsis, shock, circulatory failure and cardiac failure   Critical care was time spent personally by me on the following activities:  Development of treatment plan with patient or surrogate, discussions with consultants, evaluation of patient's response to treatment, examination of patient, ordering and review of laboratory studies, ordering and review of radiographic studies, ordering and performing treatments and interventions, pulse oximetry, re-evaluation of patient's condition, review of old charts and obtaining history from patient or surrogate   I assumed direction of critical care for this patient from another provider in my specialty: no     Care discussed with: admitting provider     MEDICATIONS ORDERED IN ED: Medications  oseltamivir (TAMIFLU) capsule 75 mg (75 mg Oral Given 03/18/21 0205)  methylPREDNISolone sodium succinate (SOLU-MEDROL) 125 mg/2 mL injection (125 mg  Given 03/18/21 0123)  ipratropium-albuterol (DUONEB) 0.5-2.5 (3) MG/3ML nebulizer solution (9  mLs  Given 03/18/21 0120)  iohexol (OMNIPAQUE) 350 MG/ML injection 75 mL (75 mLs Intravenous Contrast Given 03/18/21 0341)     IMPRESSION / MDM / [place] / ED COURSE  I reviewed the triage vital signs and the nursing notes.  76 y.o. female with a history of former smoking, emphysema, rheumatoid arthritis, osteoporosis, anemia who presents for evaluation of shortness of breath, generalized weakness, productive cough worsening over the last week.  Patient has been on prednisone and levofloxacin for the last week with no improvement.  On arrival patient is in moderate respiratory distress, tachypneic, hypoxic, retracting, wheezing bilaterally with decreased air movement.  Patient looks euvolemic with no asymmetric leg swelling.  Ddx: COPD exacerbation versus pneumonia versus COVID versus flu versus PE versus CHF versus pericardial effusion versus myocarditis versus pericarditis  Plan: Chest x-ray, CBC, BMP, troponin, COVID/flu swab, D-dimer.  DuoNeb x3, IV Solu-Medrol, nasal cannula oxygen.  Patient be placed on telemetry for close monitoring of cardiorespiratory status.  ED COURSE: Patient is influenza A positive, Tamiflu has been ordered.  After 3 DuoNeb's and Solu-Medrol she still remains hypoxic with increased work of breathing therefore patient was started on high flow nasal cannula.  BiPAP was considered but patient reports severe anxiety so we will start with high flow nasal cannula.  Mildly elevated white count with a left shift in the setting of an infection and recent prednisone use.  Chemistry panel with mildly depressed sodium of 128, normal kidney function and no other significant electrolyte derangements.  Troponin x2 negative.  EKG with no signs of ischemia or dysrhythmias.  Chest x-ray visualized by me with no signs of pneumonia, confirmed by radiology. D-dimer elevated therefore patient was sent for CT angio which was visualized by me negative for PE, confirmed by radiology.   Patient's O2 sat is improved significantly on high flow nasal cannula.  Patient is stable for admission  Consults: Consulted hospitalist for admission who accepted patient to their service.  EMR reviewed including last visit with her pulmonologist Dr. Raul Del on August 2022 for her COPD/emphysema.         FINAL CLINICAL IMPRESSION(S) / ED DIAGNOSES   Final diagnoses:  Acute respiratory failure with hypoxia (HCC)  Influenza A  Hyponatremia     Rx / DC Orders   ED Discharge Orders     None        Note:  This document was prepared using Dragon voice recognition software and may include unintentional dictation errors.    Alfred Levins, [place], MD 03/18/21 [phone]

## 2021-03-18 NOTE — ED Notes (Addendum)
Family at bedside states that pt appears to have abdominal distention which is not normal for her. Abdomen assessed. Distention noted. Pt denies any pain at site. Bladder scan performed. 270 ml noted in bladder. Provider will be notified.

## 2021-03-18 NOTE — H&P (Signed)
[place]   PATIENT NAME: Ashley Mack    MR#:  [id number]  DATE OF BIRTH:  1946/03/11  DATE OF ADMISSION:  03/18/2021  PRIMARY CARE PHYSICIAN: Ashley Ruths, MD   Patient is coming from: Home  REQUESTING/REFERRING PHYSICIAN: Rudene Re, MD  CHIEF COMPLAINT:   Chief Complaint  Patient presents with   Shortness of Breath    HISTORY OF PRESENT ILLNESS:  Ashley Mack is a 76 y.o. female with medical history significant for hypertension, anxiety, GERD, migraine, rheumatoid arthritis, fatty liver and COPD, who presented to emergency room with acute onset of worsening dyspnea with associated congested productive cough and wheezing for the last week.  She denies any fever or chills.  No chest pain or palpitations.  She was seen in an urgent care and given a prescription for Levaquin on Tuesday for suspected bronchitis, as well as prednisone taper.  Her symptoms got only mildly better.  She admitted to chest tightness with no pleuritic pain.  No recent travels or surgeries, leg pain or edema.  No nausea or vomiting or abdominal pain or diarrhea.  ED Course: When she came to the ER blood pressure was 150/83 with a heart rate of 110 and pulse oximetry was 95% on 2 L of O2 by nasal cannula.  She later on required high flow nasal cannula 40% FiO2 with a pulse oximetry going up to 97%.  CMP showed hyponatremia and hypochloremia and albumin of 3.1.  CBC showed WC of 10.9 with neutrophilia.  Influenza A came back positive and influenza antigen was negative.  COVID-19 PCR came back negative.  D-dimer was significantly elevated at 6.44.  EKG as reviewed by me : EKG showed sinus tachycardia with a rate of 110 with Q waves anteroseptally. Imaging: 2 view chest x-ray showed mild bibasilar subsegmental atelectasis with small bilateral pleural effusions.  Chest CTA revealed no evidence for PE.  It showed mild central lobular emphysema and compression fracture deformities of indeterminate  age involving T6, T7, T8 and T9 vertebral bodies and most severe at the level of T7 with evidence of prior vertebroplasty at the levels of T5, T11, T12 and L1 as well as L2.  It showed aortic atherosclerosis.  Data patient was given p.o. Tamiflu and will be admitted to a PCU bed for further evaluation and management. 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[place])    Iron (Fe) deficiency anemia    Migraine headache    in past   Migraines    Motion sickness    No natural teeth    Osteoporosis    Rheumatoid arthritis ([place])    Vertigo     PAST SURGICAL HISTORY:   Past Surgical History:  Procedure Laterality Date   CATARACT EXTRACTION W/PHACO Right 08/12/2016   Procedure: CATARACT EXTRACTION PHACO AND INTRAOCULAR LENS PLACEMENT ([place])  Right;  Surgeon: Leandrew Koyanagi, MD;  Location: [facility];  Service: Ophthalmology;  Laterality: Right;   CATARACT EXTRACTION W/PHACO Left 09/30/2016   Procedure: CATARACT EXTRACTION PHACO  AND INTRAOCULAR LENS PLACEMENT ([place]) Left;  Surgeon: Leandrew Koyanagi, MD;  Location: [facility];  Service: Ophthalmology;  Laterality: Left;   COLONOSCOPY     COLONOSCOPY WITH PROPOFOL N/A 12/09/2016   Procedure: COLONOSCOPY WITH PROPOFOL;  Surgeon: Toledo, Benay Pike, MD;  Location: [facility];  Service: Endoscopy;  Laterality: N/A;   ESOPHAGOGASTRODUODENOSCOPY     ESOPHAGOGASTRODUODENOSCOPY (EGD) WITH PROPOFOL N/A 12/09/2016   Procedure: ESOPHAGOGASTRODUODENOSCOPY (EGD) WITH PROPOFOL;  Surgeon: Toledo, Benay Pike, MD;  Location: [facility];  Service: Endoscopy;  Laterality: N/A;    KYPHOPLASTY N/A 08/10/2017   Procedure: Hewitt Shorts;  Surgeon: Hessie Knows, MD;  Location: [facility];  Service: Orthopedics;  Laterality: N/A;   KYPHOPLASTY N/A 09/09/2017   Procedure: [id number]-J6,B3;  Surgeon: Hessie Knows, MD;  Location: [facility];  Service: Orthopedics;  Laterality: N/A;   KYPHOPLASTY N/A 09/28/2017   Procedure: [id number];  Surgeon: Hessie Knows, MD;  Location: [facility];  Service: Orthopedics;  Laterality: N/A;   KYPHOPLASTY N/A 06/19/2019   Procedure: KYPHOPLASTY T5;  Surgeon: Hessie Knows, MD;  Location: [facility];  Service: Orthopedics;  Laterality: N/A;   OOPHORECTOMY Bilateral 2003   RIGHT/LEFT HEART CATH AND CORONARY ANGIOGRAPHY Bilateral 10/19/2019   Procedure: RIGHT/LEFT HEART CATH AND CORONARY ANGIOGRAPHY;  Surgeon: Yolonda Kida, MD;  Location: [facility];  Service: Cardiovascular;  Laterality: Bilateral;    SOCIAL HISTORY:   Social History   Tobacco Use   Smoking status: Former    Packs/day: 1.00    Types: Cigarettes    Quit date: 2009    Years since quitting: 14.0   Smokeless tobacco: Never  Substance Use Topics   Alcohol use: No    FAMILY HISTORY:   Family History  Problem Relation Age of Onset   Kidney disease Mother    Diabetes Mellitus II Brother     DRUG ALLERGIES:   Allergies  Allergen Reactions   Methotrexate Derivatives Nausea And Vomiting and Other (See Comments)    Flu like symptoms   Other Nausea Only and Other (See Comments)    Arthritis medications . Unsure of what the med was.   Ranitidine Hives   Sulfasalazine Other (See Comments)    Stomach upset   Topiramate Nausea Only    REVIEW OF SYSTEMS:   ROS As per history of present illness. All pertinent systems were reviewed above. Constitutional, HEENT, cardiovascular, respiratory, GI, GU, musculoskeletal, neuro, psychiatric, endocrine, integumentary and hematologic systems were reviewed and are otherwise negative/unremarkable except for positive  findings mentioned above in the HPI.   MEDICATIONS AT HOME:   Prior to Admission medications   Medication Sig Start Date End Date Taking? Authorizing Provider  albuterol (VENTOLIN HFA) 108 (90 Base) MCG/ACT inhaler Inhale 2 puffs into the lungs every 6 (six) hours as needed for wheezing or shortness of breath.     [provider]  benzonatate (TESSALON) 100 MG capsule Take 2 capsules (200 mg total) by mouth every 8 (eight) hours. 03/11/21   Margarette Canada, NP  Cholecalciferol (VITAMIN D3) 2000 UNITS capsule Take 2,000 Units by mouth daily.     [provider]  cyanocobalamin (,VITAMIN B-12,) 1000 MCG/ML injection Inject 1 mL (1,000 mcg total) into the muscle every 30 (thirty) days. 08/15/20   Earlie Server, MD  cyclobenzaprine (FLEXERIL) 5 MG tablet Take by mouth. 10/29/20   [provider]  DEXILANT 60 MG capsule Take 60 mg by mouth daily.  08/04/14   [provider]  Fe Fum-FePoly-Vit C-Vit B3 (INTEGRA)  62.5-62.5-40-3 MG CAPS Take 1 capsule by mouth daily. 11/29/13   [provider]  fluticasone (FLONASE) 50 MCG/ACT nasal spray Place into the nose.    [provider]  Ginger, Zingiber officinalis, (GINGER PO) Take by mouth.    [provider]  guaiFENesin-codeine (ROBITUSSIN AC) 100-10 MG/5ML syrup Take 5 mLs by mouth 3 (three) times daily as needed for cough. 03/11/21   Margarette Canada, NP  HYDROcodone-acetaminophen (NORCO/VICODIN) 5-325 MG tablet Take by mouth. 09/22/19   [provider]  levofloxacin (LEVAQUIN) 500 MG tablet Take 1 tablet (500 mg total) by mouth daily. 03/11/21   Margarette Canada, NP  ondansetron (ZOFRAN ODT) 4 MG disintegrating tablet Take 1 tablet (4 mg total) by mouth every 8 (eight) hours as needed for nausea or vomiting. 09/15/20   Vanessa [place], MD  predniSONE (DELTASONE) 5 MG tablet Take 5 mg by mouth daily.     [provider]  Saline (SIMPLY SALINE) 0.9 % AERS Place 1 spray into the nose as needed  (congestion).     [provider]      VITAL SIGNS:  Blood pressure (!) 139/94, pulse (!) 108, temperature 97.7 F (36.5 C), temperature source Oral, resp. rate 17, height 5\' 2"  (1.575 m), weight 63.5 kg, SpO2 97 %.  PHYSICAL EXAMINATION:  Physical Exam  GENERAL:  76 y.o.-year-old Caucasian female patient lying in the bed with mild respiratory distress with conversational dyspnea on high flow nasal cannula. EYES: Pupils equal, round, reactive to light and accommodation. No scleral icterus. Extraocular muscles intact.  HEENT: Head atraumatic, normocephalic. Oropharynx and nasopharynx clear.  NECK:  Supple, no jugular venous distention. No thyroid enlargement, no tenderness.  LUNGS: Diffuse expiratory wheezes with diminished expiratory airflow and harsh vesicular breathing. CARDIOVASCULAR: Regular rate and rhythm, S1, S2 normal. No murmurs, rubs, or gallops.  ABDOMEN: Soft, nondistended, nontender. Bowel sounds present. No organomegaly or mass.  EXTREMITIES: No pedal edema, cyanosis, or clubbing.  NEUROLOGIC: Cranial nerves II through XII are intact. Muscle strength 5/5 in all extremities. Sensation intact. Gait not checked.  PSYCHIATRIC: The patient is alert and oriented x 3.  Normal affect and good eye contact. SKIN: No obvious rash, lesion, or ulcer.   LABORATORY PANEL:   CBC Recent Labs  Lab 03/17/21 1455  WBC 10.9*  HGB 12.2  HCT 38.3  PLT 368   ------------------------------------------------------------------------------------------------------------------  Chemistries  Recent Labs  Lab 03/17/21 1455  NA 128*  K 4.6  CL 91*  CO2 27  GLUCOSE 111*  BUN 19  CREATININE 0.89  CALCIUM 8.5*  AST 22  ALT 19  ALKPHOS 54  BILITOT 0.5   ------------------------------------------------------------------------------------------------------------------  Cardiac Enzymes No results for input(s): TROPONINI in the last 168  hours. ------------------------------------------------------------------------------------------------------------------  RADIOLOGY:  DG Chest 2 View  Result Date: 03/17/2021 CLINICAL DATA:  Shortness of breath. EXAM: CHEST - 2 VIEW COMPARISON:  March 11, 2021. FINDINGS: The heart size and mediastinal contours are within normal limits. Mild bibasilar subsegmental atelectasis is noted. Small pleural effusions may be present. Status post kyphoplasty at multiple levels of the thoracic spine. Multiple probable old compression fractures are noted as well. IMPRESSION: Mild bibasilar subsegmental atelectasis with small bilateral pleural effusions. Electronically Signed   By: Marijo Conception M.D.   On: 03/17/2021 15:36   CT Angio Chest PE W and/or Wo Contrast  Result Date: 03/18/2021 CLINICAL DATA:  Low oxygen saturation and elevated D-dimer. EXAM: CT ANGIOGRAPHY CHEST WITH CONTRAST TECHNIQUE: Multidetector CT imaging of the chest  was performed using the standard protocol during bolus administration of intravenous contrast. Multiplanar CT image reconstructions and MIPs were obtained to evaluate the vascular anatomy. CONTRAST:  81mL OMNIPAQUE IOHEXOL 350 MG/ML SOLN COMPARISON:  June 16, 2019 FINDINGS: Cardiovascular: There is marked severity calcification of the thoracic aorta, without evidence of aortic aneurysm or dissection. Satisfactory opacification of the pulmonary arteries to the segmental level. No evidence of pulmonary embolism. Normal heart size. No pericardial effusion. Mediastinum/Nodes: No enlarged mediastinal, hilar, or axillary lymph nodes. Thyroid gland, trachea, and esophagus demonstrate no significant findings. Lungs/Pleura: There is mild central lobular emphysema. Mild atelectatic changes are seen within the posterior aspects of the bilateral lung bases, right greater than left. There is no evidence of a pleural effusion or pneumothorax. Upper Abdomen: A 1.8 cm x 1.1 cm focus of parenchymal low  attenuation is seen within the posterior aspect of the left lobe of the liver. Musculoskeletal: Compression fracture deformities of indeterminate age are seen involving the T6, T7, T8 and T9 vertebral bodies. This is most severe at the level of T7, which represents a new finding when compared to the prior study. Evidence of prior vertebroplasty is noted at the levels of T5, T11, T12, L1 and L2. Review of the MIP images confirms the above findings. IMPRESSION: 1. No evidence of pulmonary embolus. 2. Mild central lobular emphysema. 3. Compression fracture deformities of indeterminate age involving the T6, T7, T8 and T9 vertebral bodies, most severe at the level of T7. 4. Evidence of prior vertebroplasty at the levels of T5, T11, T12, L1 and L2. Aortic Atherosclerosis (ICD10-I70.0) and Emphysema (ICD10-J43.9). Electronically Signed   By: Virgina Norfolk M.D.   On: 03/18/2021 04:00      IMPRESSION AND PLAN:  Principal Problem:   COPD exacerbation ([place])  1.  COPD acute exacerbation due to influenza A and possibly associated acute bronchitis with subsequent acute hypoxic respiratory failure requiring high flow nasal cannula. - The patient will be admitted to a progressive unit bed. - We will continue p.o. Tamiflu for 5 days. - We will continue steroid therapy with IV Solu-Medrol. - DuoNebs will be provided 4 times daily and every 4 hours as needed. - Mucolytic therapy will be given. - We will add IV Rocephin given severity of COPD exacerbation. - O2 protocol will be followed. - We will hold off Symbicort and p.o. Levaquin.  2.  Hyponatremia, likely hypovolemic. - The patient will be hydrated with IV normal saline and will follow her BMP.  3.  Vitamin B12 deficiency. - We will continue vitamin B12.  4.  GERD. - We will continue PPI therapy.  5.  Rheumatoid arthritis. - This is apparently in remission.  DVT prophylaxis: Lovenox. Code Status: full code. Family Communication:  The plan of  care was discussed in details with the patient (and family). I answered all questions. The patient agreed to proceed with the above mentioned plan. Further management will depend upon hospital course. Disposition Plan: Back to previous home environment Consults called: none. All the records are reviewed and case discussed with ED provider.  Status is: Inpatient   At the time of the admission, it appears that the appropriate admission status for this patient is inpatient.  This is judged to be reasonable and necessary in order to provide the required intensity of service to ensure the patient's safety given the presenting symptoms, physical exam findings and initial radiographic and laboratory data in the context of comorbid conditions.  The patient requires inpatient status  due to high intensity of service, high risk of further deterioration and high frequency of surveillance required.  I certify that at the time of admission, it is my clinical judgment that the patient will require inpatient hospital care extending more than 2 midnights.                            Dispo: The patient is from: Home              Anticipated d/c is to: Home              Patient currently is not medically stable to d/c.              Difficult to place patient: No    Christel Mormon M.D on 03/18/2021 at 4:45 AM  [facility]   From 7 PM-7 AM, contact night-coverage [email or web address]  CC: Primary care physician; Ashley Ruths, MD

## 2021-03-19 ENCOUNTER — Inpatient Hospital Stay: Payer: Medicare Other

## 2021-03-19 DIAGNOSIS — J441 Chronic obstructive pulmonary disease with (acute) exacerbation: Secondary | ICD-10-CM | POA: Diagnosis present

## 2021-03-19 NOTE — ED Notes (Signed)
Informed RN Amy of bed assignment

## 2021-03-19 NOTE — ED Notes (Signed)
Admitting MD at the bedside.  

## 2021-03-19 NOTE — Progress Notes (Signed)
PROGRESS NOTE    Ashley Mack  KDX:[id number] DOB: 05/08/1945 DOA: 03/18/2021 PCP: Kirk Ruths, MD    Brief Narrative:  76 year old with hypertension, anxiety, GERD, migraine and COPD not on home oxygen presented with acute onset of worsening dyspnea with associated congestive productive cough and wheezing for 1 week.  Recently treated with Levaquin and prednisone taper.  In the emergency room blood pressure stable.  Heart rate 110.  95% on 2 L oxygen.  Subsequently required high flow nasal cannula oxygen.  Influenza A positive.  COVID-19 negative.  D-dimer was 6.44.  CTA with no evidence of PE.  Showed emphysema and compression fractures   Assessment & Plan:   Principal Problem:   COPD exacerbation ([place])  COPD with acute exacerbation due to acute influenza A infection: Still with persistent symptoms. Aggressive bronchodilator therapy, IV steroids, inhalational steroids, scheduled and as needed bronchodilators, deep breathing exercises, incentive spirometry, chest physiotherapy and respiratory therapy.  Mobilize with PT OT today. Antibiotics due to severity of symptoms.  On Rocephin.  Continue until clinical improvement. Supplemental oxygen to keep saturations more than 90%. Tamiflu for 5 days. Lower extremity duplexes today due to persistent swelling, negative for DVT. Persistent high flow oxygen requirement, will check echocardiogram today. CT angiogram with no evidence of pulmonary embolism, no pneumonia.  Essential hypertension, not on treatment.  Currently not needing any treatment.    DVT prophylaxis: enoxaparin (LOVENOX) injection 40 mg Start: 03/18/21 1000   Code Status: Full code Family Communication: None Disposition Plan: Status is: Inpatient  Remains inpatient appropriate because: Still on significant oxygen  Consultants:  None  Procedures:  None  Antimicrobials:  Rocephin 1/2--- Tamiflu 1/2--   Subjective: Patient was seen and examined.   Remains in the emergency room.  On my morning interview, she was on high flow oxygen and was asymptomatic.  We were able to taper off to nasal cannula oxygen 3 to 4 L now.  Shortness of breath with exertion.  No trouble at rest.  Objective: Vitals:   03/19/21 0900 03/19/21 0930 03/19/21 1000 03/19/21 1245  BP:      Pulse: (!) 114 (!) 107 (!) 104 (!) 107  Resp: (!) 29 (!) 25 (!) 23 20  Temp:      TempSrc:      SpO2: 91% 92% 90% 92%  Weight:      Height:        Intake/Output Summary (Last 24 hours) at 03/19/2021 1342 Last data filed at 03/19/2021 0544 Gross per 24 hour  Intake 100 ml  Output --  Net 100 ml   Filed Weights   03/17/21 1444  Weight: 63.5 kg    Examination:  General exam: Appears calm and comfortable at rest.  Mildly anxious on mobility. Respiratory system: Mostly bilateral clear. Cardiovascular system: S1 & S2 heard, RRR. No JVD, murmurs, rubs, gallops or clicks. Gastrointestinal system: Abdomen is nondistended, soft and nontender. No organomegaly or masses felt. Normal bowel sounds heard. Central nervous system: Alert and oriented. No focal neurological deficits. Extremities: 1+ bilateral pedal edema.  Slightly tender on the dorsum of the feet.    Data Reviewed: I have personally reviewed following labs and imaging studies  CBC: Recent Labs  Lab 03/17/21 1455 03/18/21 0630  WBC 10.9* 15.4*  NEUTROABS 9.3*  --   HGB 12.2 12.1  HCT 38.3 37.3  MCV 87.0 86.3  PLT 368 976   Basic Metabolic Panel: Recent Labs  Lab 03/17/21 1455 03/18/21 0630  NA 128*  130*  K 4.6 4.6  CL 91* 91*  CO2 27 25  GLUCOSE 111* 136*  BUN 19 26*  CREATININE 0.89 1.06*  CALCIUM 8.5* 8.4*   GFR: Estimated Creatinine Clearance: 38.9 mL/min (A) (by C-G formula based on SCr of 1.06 mg/dL (H)). Liver Function Tests: Recent Labs  Lab 03/17/21 1455  AST 22  ALT 19  ALKPHOS 54  BILITOT 0.5  PROT 6.5  ALBUMIN 3.1*   No results for input(s): LIPASE, AMYLASE in the last 168  hours. No results for input(s): AMMONIA in the last 168 hours. Coagulation Profile: No results for input(s): INR, PROTIME in the last 168 hours. Cardiac Enzymes: No results for input(s): CKTOTAL, CKMB, CKMBINDEX, TROPONINI in the last 168 hours. BNP (last 3 results) No results for input(s): PROBNP in the last 8760 hours. HbA1C: No results for input(s): HGBA1C in the last 72 hours. CBG: No results for input(s): GLUCAP in the last 168 hours. Lipid Profile: No results for input(s): CHOL, HDL, LDLCALC, TRIG, CHOLHDL, LDLDIRECT in the last 72 hours. Thyroid Function Tests: No results for input(s): TSH, T4TOTAL, FREET4, T3FREE, THYROIDAB in the last 72 hours. Anemia Panel: No results for input(s): VITAMINB12, FOLATE, FERRITIN, TIBC, IRON, RETICCTPCT in the last 72 hours. Sepsis Labs: No results for input(s): PROCALCITON, LATICACIDVEN in the last 168 hours.  Recent Results (from the past 240 hour(s))  Resp Panel by RT-PCR (Flu A&B, Covid) Nasopharyngeal Swab     Status: Abnormal   Collection Time: 03/17/21  2:55 PM   Specimen: Nasopharyngeal Swab; Nasopharyngeal(NP) swabs in vial transport medium  Result Value Ref Range Status   SARS Coronavirus 2 by RT PCR NEGATIVE NEGATIVE Final    Comment: (NOTE) SARS-CoV-2 target nucleic acids are NOT DETECTED.  The SARS-CoV-2 RNA is generally detectable in upper respiratory specimens during the acute phase of infection. The lowest concentration of SARS-CoV-2 viral copies this assay can detect is 138 copies/mL. A negative result does not preclude SARS-Cov-2 infection and should not be used as the sole basis for treatment or other patient management decisions. A negative result may occur with  improper specimen collection/handling, submission of specimen other than nasopharyngeal swab, presence of viral mutation(s) within the areas targeted by this assay, and inadequate number of viral copies(<138 copies/mL). A negative result must be combined  with clinical observations, patient history, and epidemiological information. The expected result is Negative.  Fact Sheet for Patients:  EntrepreneurPulse.com.au  Fact Sheet for Healthcare Providers:  IncredibleEmployment.be  This test is no t yet approved or cleared by the [place] FDA and  has been authorized for detection and/or diagnosis of SARS-CoV-2 by FDA under an Emergency Use Authorization (EUA). This EUA will remain  in effect (meaning this test can be used) for the duration of the COVID-19 declaration under Section 564(b)(1) of the Act, 21 U.S.C.section 360bbb-3(b)(1), unless the authorization is terminated  or revoked sooner.       Influenza A by PCR POSITIVE (A) NEGATIVE Final   Influenza B by PCR NEGATIVE NEGATIVE Final    Comment: (NOTE) The Xpert Xpress SARS-CoV-2/FLU/RSV plus assay is intended as an aid in the diagnosis of influenza from Nasopharyngeal swab specimens and should not be used as a sole basis for treatment. Nasal washings and aspirates are unacceptable for Xpert Xpress SARS-CoV-2/FLU/RSV testing.  Fact Sheet for Patients: EntrepreneurPulse.com.au  Fact Sheet for Healthcare Providers: IncredibleEmployment.be  This test is not yet approved or cleared by the [place] FDA and has been authorized for detection and/or  diagnosis of SARS-CoV-2 by FDA under an Emergency Use Authorization (EUA). This EUA will remain in effect (meaning this test can be used) for the duration of the COVID-19 declaration under Section 564(b)(1) of the Act, 21 U.S.C. section 360bbb-3(b)(1), unless the authorization is terminated or revoked.  Performed at [facility], [address], [place], [place] [address]          Radiology Studies: DG Chest 2 View  Result Date: 03/17/2021 CLINICAL DATA:  Shortness of breath. EXAM: CHEST - 2 VIEW COMPARISON:  March 11, 2021.  FINDINGS: The heart size and mediastinal contours are within normal limits. Mild bibasilar subsegmental atelectasis is noted. Small pleural effusions may be present. Status post kyphoplasty at multiple levels of the thoracic spine. Multiple probable old compression fractures are noted as well. IMPRESSION: Mild bibasilar subsegmental atelectasis with small bilateral pleural effusions. Electronically Signed   By: Marijo Conception M.D.   On: 03/17/2021 15:36   CT Angio Chest PE W and/or Wo Contrast  Result Date: 03/18/2021 CLINICAL DATA:  Low oxygen saturation and elevated D-dimer. EXAM: CT ANGIOGRAPHY CHEST WITH CONTRAST TECHNIQUE: Multidetector CT imaging of the chest was performed using the standard protocol during bolus administration of intravenous contrast. Multiplanar CT image reconstructions and MIPs were obtained to evaluate the vascular anatomy. CONTRAST:  69mL OMNIPAQUE IOHEXOL 350 MG/ML SOLN COMPARISON:  June 16, 2019 FINDINGS: Cardiovascular: There is marked severity calcification of the thoracic aorta, without evidence of aortic aneurysm or dissection. Satisfactory opacification of the pulmonary arteries to the segmental level. No evidence of pulmonary embolism. Normal heart size. No pericardial effusion. Mediastinum/Nodes: No enlarged mediastinal, hilar, or axillary lymph nodes. Thyroid gland, trachea, and esophagus demonstrate no significant findings. Lungs/Pleura: There is mild central lobular emphysema. Mild atelectatic changes are seen within the posterior aspects of the bilateral lung bases, right greater than left. There is no evidence of a pleural effusion or pneumothorax. Upper Abdomen: A 1.8 cm x 1.1 cm focus of parenchymal low attenuation is seen within the posterior aspect of the left lobe of the liver. Musculoskeletal: Compression fracture deformities of indeterminate age are seen involving the T6, T7, T8 and T9 vertebral bodies. This is most severe at the level of T7, which represents a  new finding when compared to the prior study. Evidence of prior vertebroplasty is noted at the levels of T5, T11, T12, L1 and L2. Review of the MIP images confirms the above findings. IMPRESSION: 1. No evidence of pulmonary embolus. 2. Mild central lobular emphysema. 3. Compression fracture deformities of indeterminate age involving the T6, T7, T8 and T9 vertebral bodies, most severe at the level of T7. 4. Evidence of prior vertebroplasty at the levels of T5, T11, T12, L1 and L2. Aortic Atherosclerosis (ICD10-I70.0) and Emphysema (ICD10-J43.9). Electronically Signed   By: Virgina Norfolk M.D.   On: 03/18/2021 04:00   US Venous Img Lower Bilateral (DVT)  Result Date: 03/19/2021 CLINICAL DATA:  Shortness of breath, leg pain EXAM: BILATERAL LOWER EXTREMITY VENOUS DOPPLER ULTRASOUND TECHNIQUE: Gray-scale sonography with compression, as well as color and duplex ultrasound, were performed to evaluate the deep venous system(s) from the level of the common femoral vein through the popliteal and proximal calf veins. COMPARISON:  None. FINDINGS: VENOUS Normal compressibility of BILATERAL common femoral, superficial femoral, and popliteal veins, as well as the visualized calf veins. Visualized portions of BILATERAL profunda femoral vein and great saphenous vein unremarkable. No filling defects to suggest DVT on grayscale or color Doppler imaging. Doppler waveforms  show normal direction of venous flow, normal respiratory plasticity and response to augmentation. OTHER None. Limitations: none IMPRESSION: No evidence of deep venous thrombosis in either lower extremity. Electronically Signed   By: Lavonia Dana M.D.   On: 03/19/2021 09:59        Scheduled Meds:  [START ON 03/20/2021] cholecalciferol  2,000 Units Oral Daily   [START ON 03/31/2021] cyanocobalamin  1,000 mcg Intramuscular Q30 days   enoxaparin (LOVENOX) injection  40 mg Subcutaneous Daily   guaiFENesin  600 mg Oral BID   ipratropium-albuterol  3 mL  Nebulization Q6H   methylPREDNISolone (SOLU-MEDROL) injection  40 mg Intravenous Q12H   multivitamin with minerals  1 tablet Oral Daily   oseltamivir  30 mg Oral BID   pantoprazole  40 mg Oral Daily   Continuous Infusions:  cefTRIAXone (ROCEPHIN)  IV Stopped (03/19/21 0544)     LOS: 1 day    Time spent: 35 minutes    Barb Merino, MD [facility] Pager [phone]

## 2021-03-19 NOTE — Evaluation (Signed)
Physical Therapy Evaluation Patient Details Name: Ashley Mack MRN: [id number] DOB: 1946/03/04 Today's Date: 03/19/2021  History of Present Illness  Pt is a 76 y.o. female with medical history significant for hypertension, anxiety, GERD, migraine, rheumatoid arthritis, fatty liver and COPD, who presented to emergency room with acute onset of worsening dyspnea with associated congested productive cough and wheezing for the last week. Workup +flu.   Clinical Impression  Pt alert, agreeable to PT, denied pain. Informed PT of history of vision deficits. At baseline the patient said she lives alone, has family/neighbors that assist with IADLs, such as driving, cleaning, but normally she is independent with mobility, and able to do some cooking.   With speaking and some LE movement in supine, spO2 dropped to 86%-89% on 3L, placed on 4L for mobility. The patient was able to perform supine to sit with CGA, fair sitting balance noted. Sit <> stand with RW and CGA from very elevated surface. The patient was able to march in place, step forwards/backwards/laterally towards [facility]. Standing ~5 minutes. Returned to supine with minA due to elevated surface, pt becoming very dyspneic. Placed on 5L due to desaturation to low 80s. After several minutes and use of inhaler, able to return to 3L with spO2 >90%.  Overall the patient demonstrated deficits (see "PT Problem List") that impede the patient's functional abilities, safety, and mobility and would benefit from skilled PT intervention. Recommendation at this time is SNF due to decline from PLOF and decreased activity tolerance/endurance.      Recommendations for follow up therapy are one component of a multi-disciplinary discharge planning process, led by the attending physician.  Recommendations may be updated based on patient status, additional functional criteria and insurance authorization.  Follow Up Recommendations Skilled nursing-short term rehab (<3  hours/day)    Assistance Recommended at Discharge Frequent or constant Supervision/Assistance  Patient can return home with the following  A little help with walking and/or transfers;A lot of help with bathing/dressing/bathroom;Assist for transportation;Assistance with cooking/housework;Help with stairs or ramp for entrance    Equipment Recommendations Rolling walker (2 wheels);BSC/3in1  Recommendations for Other Services       Functional Status Assessment Patient has had a recent decline in their functional status and demonstrates the ability to make significant improvements in function in a reasonable and predictable amount of time.     Precautions / Restrictions Precautions Precautions: Fall Restrictions Weight Bearing Restrictions: No      Mobility  Bed Mobility Overal bed mobility: Needs Assistance Bed Mobility: Supine to Sit;Sit to Supine     Supine to sit: HOB elevated;Min guard Sit to supine: Min assist   General bed mobility comments: assist due to elevated ER gurney    Transfers Overall transfer level: Needs assistance Equipment used: Rolling walker (2 wheels) Transfers: Sit to/from Stand Sit to Stand: Min guard;From elevated surface                Ambulation/Gait Ambulation/Gait assistance: Min guard Gait Distance (Feet): 15 Feet Assistive device: Rolling walker (2 wheels)         General Gait Details: marching at EOB, side stepping, fowards/backwards. able to stand ~5 minutes  Stairs            Wheelchair Mobility    Modified Rankin (Stroke Patients Only)       Balance Overall balance assessment: Needs assistance Sitting-balance support: Feet supported Sitting balance-Leahy Scale: Good       Standing balance-Leahy Scale: Fair  Pertinent Vitals/Pain Pain Assessment: No/denies pain    Home Living Family/patient expects to be discharged to:: Private residence Living Arrangements:  Alone Available Help at Discharge: Family Type of Home: House Home Access: Stairs to enter Entrance Stairs-Rails: None Entrance Stairs-Number of Steps: 3   Home Layout: One level Home Equipment: Rollator (4 wheels);Cane - single point;Wheelchair - Brewing technologist;Shower seat - built in;Grab bars - tub/shower Additional Comments: no falls in the last 6 months    Prior Function Prior Level of Function : Needs assist             Mobility Comments: independent for mobility. ADLs Comments: neighbors/family assist with IADLs, pt uses WC for grocery shopping, does not drive, limited vision     Hand Dominance   Dominant Hand: Right    Extremity/Trunk Assessment   Upper Extremity Assessment Upper Extremity Assessment: Generalized weakness    Lower Extremity Assessment Lower Extremity Assessment: Generalized weakness    Cervical / Trunk Assessment Cervical / Trunk Assessment: Kyphotic  Communication   Communication: No difficulties  Cognition Arousal/Alertness: Awake/alert Behavior During Therapy: WFL for tasks assessed/performed Overall Cognitive Status: Within Functional Limits for tasks assessed                                          General Comments      Exercises     Assessment/Plan    PT Assessment Patient needs continued PT services  PT Problem List Decreased strength;Decreased mobility;Decreased range of motion;Decreased activity tolerance;Decreased balance;Decreased knowledge of use of DME;Cardiopulmonary status limiting activity       PT Treatment Interventions DME instruction;Therapeutic exercise;Gait training;Balance training;Stair training;Neuromuscular re-education;Functional mobility training;Therapeutic activities;Patient/family education    PT Goals (Current goals can be found in the Care Plan section)  Acute Rehab PT Goals Patient Stated Goal: to go home PT Goal Formulation: With patient Time For Goal Achievement:  04/02/21 Potential to Achieve Goals: Good    Frequency Min 2X/week     Co-evaluation               AM-PAC PT "6 Clicks" Mobility  Outcome Measure Help needed turning from your back to your side while in a flat bed without using bedrails?: None Help needed moving from lying on your back to sitting on the side of a flat bed without using bedrails?: A Little Help needed moving to and from a bed to a chair (including a wheelchair)?: A Little Help needed standing up from a chair using your arms (e.g., wheelchair or bedside chair)?: A Little Help needed to walk in hospital room?: A Little Help needed climbing 3-5 steps with a railing? : A Lot 6 Click Score: 18    End of Session Equipment Utilized During Treatment: Oxygen (3L-5L) Activity Tolerance: Other (comment) (limited by SOB) Patient left: in bed;with call bell/phone within reach Nurse Communication: Mobility status PT Visit Diagnosis: Other abnormalities of gait and mobility (R26.89);Difficulty in walking, not elsewhere classified (R26.2);Muscle weakness (generalized) (M62.81)    Time: 4259-5638 PT Time Calculation (min) (ACUTE ONLY): 30 min   Charges:   PT Evaluation $PT Eval Low Complexity: 1 Low PT Treatments $Therapeutic Activity: 23-37 mins       Lieutenant Diego PT, DPT 12:26 PM,03/19/21

## 2021-03-19 NOTE — ED Notes (Signed)
Pt complaining of hip/back pain and requesting pain medication.

## 2021-03-19 NOTE — ED Notes (Signed)
Pt oxygen remains 98% and above. Decreased oxygen to 6L [place].

## 2021-03-19 NOTE — ED Notes (Signed)
Per MD, attempt to wean to [place] with oxygen saturation goal of 90%,

## 2021-03-19 NOTE — Progress Notes (Signed)
OT Cancellation Note  Patient Details Name: Ashley Mack MRN: [id number] DOB: 28-Oct-1945   Cancelled Treatment:    Reason Eval/Treat Not Completed: Medical issues which prohibited therapy. OT order received. Chart reviewed. Pt noted to be pending imaging to t/o LE DVT. Will hold at this time and re-attempt at a later date/time as available and pt medically appropriate for OT evaluation.   Shara Blazing, M.S., OTR/L [facility] Ascom: [phone] 03/19/21, 8:41 AM

## 2021-03-19 NOTE — ED Notes (Signed)
Placed pt on salter at [place].

## 2021-03-19 NOTE — ED Notes (Signed)
Pt with 200cc urine output.

## 2021-03-19 NOTE — ED Notes (Signed)
Pt resting in bed. Appears to be sleeping at this time. Chest is rising and falling symmetrically. No acute distress noted. Will continue to monitor.   °

## 2021-03-19 NOTE — ED Notes (Signed)
Pt changed to [place] at 3L with oxygen saturations 92%.

## 2021-03-20 ENCOUNTER — Inpatient Hospital Stay
Admit: 2021-03-20 | Discharge: 2021-03-20 | Disposition: A | Discharge status: Home / Self Care | Payer: Medicare Other | Attending: Internal Medicine | Admitting: Internal Medicine

## 2021-03-20 DIAGNOSIS — J441 Chronic obstructive pulmonary disease with (acute) exacerbation: Secondary | ICD-10-CM | POA: Diagnosis not present

## 2021-03-20 LAB — BASIC METABOLIC PANEL
Anion gap: 7 (ref 5–15)
BUN: 27 mg/dL — ABNORMAL HIGH (ref 8–23)
CO2: 29 mmol/L (ref 22–32)
Calcium: 8.4 mg/dL — ABNORMAL LOW (ref 8.9–10.3)
Chloride: 101 mmol/L (ref 98–111)
Creatinine, Ser: 0.85 mg/dL (ref 0.44–1.00)
GFR, Estimated: >60 mL/min (ref >60)
Glucose, Bld: 155 mg/dL — ABNORMAL HIGH (ref 70–99)
Potassium: 4.5 mmol/L (ref 3.5–5.1)
Sodium: 137 mmol/L (ref 135–145)

## 2021-03-20 LAB — CBC WITH DIFFERENTIAL/PLATELET
Abs Immature Granulocytes: 0.26 10*3/uL — ABNORMAL HIGH (ref 0.00–0.07)
Basophils Absolute: 0 10*3/uL (ref 0.0–0.1)
Basophils Relative: 0 %
Eosinophils Absolute: 0 10*3/uL (ref 0.0–0.5)
Eosinophils Relative: 0 %
HCT: 30.9 % — ABNORMAL LOW (ref 36.0–46.0)
Hemoglobin: 10.1 g/dL — ABNORMAL LOW (ref 12.0–15.0)
Immature Granulocytes: 1 %
Lymphocytes Relative: 2 %
Lymphs Abs: 0.5 10*3/uL — ABNORMAL LOW (ref 0.7–4.0)
MCH: 27.9 pg (ref 26.0–34.0)
MCHC: 32.7 g/dL (ref 30.0–36.0)
MCV: 85.4 fL (ref 80.0–100.0)
Monocytes Absolute: 0.7 10*3/uL (ref 0.1–1.0)
Monocytes Relative: 3 %
Neutro Abs: 20.5 10*3/uL — ABNORMAL HIGH (ref 1.7–7.7)
Neutrophils Relative %: 94 %
Platelets: 364 10*3/uL (ref 150–400)
RBC: 3.62 MIL/uL — ABNORMAL LOW (ref 3.87–5.11)
RDW: 15.9 % — ABNORMAL HIGH (ref 11.5–15.5)
WBC: 21.9 10*3/uL — ABNORMAL HIGH (ref 4.0–10.5)
nRBC: 0 % (ref 0.0–0.2)

## 2021-03-20 LAB — ECHOCARDIOGRAM COMPLETE
AR max vel: 3.49 cm2
AV Area VTI: 3.56 cm2
AV Area mean vel: 3.31 cm2
AV Mean grad: 4 mmHg
AV Peak grad: 7.2 mmHg
Ao pk vel: 1.34 m/s
Area-P 1/2: 7.09 cm2
Calc EF: 50.7 %
MV VTI: 5.62 cm2
Single Plane A2C EF: 50.6 %
Single Plane A4C EF: 47.7 %
Weight: 2224 oz

## 2021-03-20 LAB — PHOSPHORUS: Phosphorus: 3.3 mg/dL (ref 2.5–4.6)

## 2021-03-20 LAB — MAGNESIUM: Magnesium: 2.4 mg/dL (ref 1.7–2.4)

## 2021-03-20 MED ORDER — IPRATROPIUM-ALBUTEROL 0.5-2.5 (3) MG/3ML IN SOLN
3.0000 mL | Freq: Three times a day (TID) | RESPIRATORY_TRACT | Status: DC
  Administered 2021-03-20 – 2021-04-01 (×36): 3 mL via RESPIRATORY_TRACT
  Filled 2021-03-20 (×36): qty 3

## 2021-03-20 MED ORDER — HYDRALAZINE HCL 25 MG PO TABS: 25.0000 mg | ORAL_TABLET | Freq: Four times a day (QID) | ORAL | Status: DC | PRN

## 2021-03-20 NOTE — TOC Initial Note (Signed)
Transition of Care [facility]) - Initial/Assessment Note    Patient Details  Name: Ashley Mack MRN: [id number] Date of Birth: Mar 13, 1946  Transition of Care [facility]) CM/SW Contact:    Eileen Stanford, LCSW Phone Number: 03/20/2021, 2:47 PM  Clinical Narrative:    CSW spoke with pt regarding SNF recommendation and at this time pt is refusing SNF. Pt states she would consider HH to follow up with her at a later date.               Expected Discharge Plan: [place] Barriers to Discharge: Continued Medical Work up   Patient Goals and CMS Choice Patient states their goals for this hospitalization and ongoing recovery are:: to get better   Choice offered to / list presented to : Patient  Expected Discharge Plan and Services Expected Discharge Plan: [place] In-house Referral: NA   Post Acute Care Choice: [place] arrangements for the past 2 months: Single Family Home                                      Prior Living Arrangements/Services Living arrangements for the past 2 months: Single Family Home Lives with:: Self Patient language and need for interpreter reviewed:: Yes Do you feel safe going back to the place where you live?: Yes      Need for Family Participation in Patient Care: Yes (Comment) Care giver support system in place?: Yes (comment)   Criminal Activity/Legal Involvement Pertinent to Current Situation/Hospitalization: No - Comment as needed  Activities of Daily Living Home Assistive Devices/Equipment: Walker (specify type), Cane (specify quad or straight), Wheelchair ADL Screening (condition at time of admission) Patient's cognitive ability adequate to safely complete daily activities?: Yes Is the patient deaf or have difficulty hearing?: No Does the patient have difficulty seeing, even when wearing glasses/contacts?: Yes Does the patient have difficulty concentrating, remembering, or making decisions?: No Patient  able to express need for assistance with ADLs?: Yes Does the patient have difficulty dressing or bathing?: No Independently performs ADLs?: Yes (appropriate for developmental age) Does the patient have difficulty walking or climbing stairs?: Yes Weakness of Legs: None Weakness of Arms/Hands: None  Permission Sought/Granted Permission sought to share information with : Family Supports    Share Information with NAME: Dealer granted to share info w Relationship: daughter     Emotional Assessment Appearance:: Appears stated age Attitude/Demeanor/Rapport: Engaged Affect (typically observed): Accepting Orientation: : Oriented to  Time, Oriented to Situation, Oriented to Place, Oriented to Self Alcohol / Substance Use: Not Applicable Psych Involvement: No (comment)  Admission diagnosis:  Hyponatremia [E87.1] Influenza A [J10.1] DVT (deep venous thrombosis) (HCC) [I82.409] COPD exacerbation (HCC) [J44.1] Acute respiratory failure with hypoxia (HCC) [J96.01] COPD with acute exacerbation (HCC) [J44.1] Patient Active Problem List   Diagnosis Date Noted   COPD with acute exacerbation ([place]) 03/19/2021   COPD exacerbation ([place]) 03/18/2021   Thoracic disc herniation 11/06/2019   Arthropathy of thoracic facet joint 11/06/2019   Cervical facet joint syndrome 11/06/2019   GERD (gastroesophageal reflux disease) 06/18/2019   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[place]) 06/18/2019   H/O kyphoplasty 06/18/2019   Compression fracture of body of thoracic vertebra ([place]) 06/16/2019   Flank pain    Intractable back pain 08/08/2017   Compression fracture of lumbar vertebra ([place]) 08/08/2017   Abdominal pain,  RUQ 08/06/2017   Fatigue 05/29/2017   Exertional dyspnea 05/29/2017   Guaiac positive stools 11/26/2016   Iron deficiency anemia 09/01/2016   B12 deficiency 08/31/2016   Normocytic anemia 08/30/2016   Centrilobular emphysema ([place]) 05/09/2015   HTN (hypertension), benign  12/10/2013   PCP:  Kirk Ruths, MD Pharmacy:   [facility] [address]), [place] [address] [place]) [place] [address] Phone: [phone] Fax: [phone]     Social Determinants of Health (SDOH) Interventions    Readmission Risk Interventions No flowsheet data found.

## 2021-03-20 NOTE — Progress Notes (Signed)
PROGRESS NOTE    Ashley Mack  JKD:[id number] DOB: [birth year] DOA: 03/18/2021 PCP: Kirk Ruths, MD    Brief Narrative:  76 year old with hypertension, anxiety, GERD, migraine and COPD not on home oxygen presented with acute onset of worsening dyspnea with associated congestive productive cough and wheezing for 1 week.  Recently treated with Levaquin and prednisone taper.  In the emergency room blood pressure stable.  Heart rate 110.  95% on 2 L oxygen.  Subsequently required high flow nasal cannula oxygen.  Influenza A positive.  COVID-19 negative.  D-dimer was 6.44.  CTA with no evidence of PE.  Showed emphysema and compression fractures.   Assessment & Plan:   Principal Problem:   COPD exacerbation ([place]) Active Problems:   COPD with acute exacerbation ([place])  COPD with acute exacerbation due to acute influenza A infection: Some improvement of symptoms today.  Continues to have difficulty with mobility and tachycardia and tachypnea with mobility. Aggressive bronchodilator therapy, IV steroids, inhalational steroids, scheduled and as needed bronchodilators, deep breathing exercises, incentive spirometry, chest physiotherapy and respiratory therapy.  Mobilize. We will keep on Rocephin until clinical improvement. Supplemental oxygen to keep saturation more than 90%.  Tamiflu for 5 days. Mobilize and assess for home oxygen requirement. CTA with no evidence of pulmonary embolism, no evidence of pneumonia. 2D echocardiogram done today, results pending. Lower extremity duplex is negative for DVT.  Leukocytosis: Due to high-dose steroid use.  No evidence of bacterial infection.  Will monitor.  Essential hypertension, not on treatment.  Currently not needing any treatment.    DVT prophylaxis: enoxaparin (LOVENOX) injection 40 mg Start: 03/18/21 1000   Code Status: Full code Family Communication: None Disposition Plan: Status is: Inpatient  Remains inpatient appropriate  because: Still on oxygen.  Significant tachypnea and tachycardia with mobility.  Consultants:  None  Procedures:  None  Antimicrobials:  Rocephin 1/2--- Tamiflu 1/2--   Subjective:  Seen and examined.  Some difficulties with her back.  Feels better at rest.  Gets short of breath on minimal mobility and just getting to the bedside commode.  Minimal dry cough.  Objective: Vitals:   03/19/21 2329 03/20/21 0252 03/20/21 0455 03/20/21 0841  BP: 132/81  128/78 (!) 161/79  Pulse: (!) 103  (!) 106 (!) 108  Resp: 20  19 18   Temp: 98.4 F (36.9 C)  98.4 F (36.9 C) 98 F (36.7 C)  TempSrc: Oral  Oral   SpO2: 97% 97% 97% 94%  Weight:      Height:       No intake or output data in the 24 hours ending 03/20/21 1055  Filed Weights   03/17/21 1444 03/19/21 2148  Weight: 63.5 kg 63 kg    Examination:  General: Looks fairly comfortable at rest.  Anxious with mobility. Cardiovascular: S1-S2 normal.  Tachycardia.  Regular rate rhythm. Respiratory: On 2 L oxygen.  Patient looks comfortable at rest.  Dyspneic on mobility. Lung sounds are equal and clear bilateral immediately after bronchodilator therapy. Gastrointestinal: Soft.  Nontender.  Bowel sound present. Ext: No edema.  She does have paresthesia and tenderness both legs. Neuro: Alert and oriented x4.  No focal deficits.     Data Reviewed: I have personally reviewed following labs and imaging studies  CBC: Recent Labs  Lab 03/17/21 1455 03/18/21 0630 03/20/21 0551  WBC 10.9* 15.4* 21.9*  NEUTROABS 9.3*  --  20.5*  HGB 12.2 12.1 10.1*  HCT 38.3 37.3 30.9*  MCV 87.0 86.3 85.4  PLT 368 342 834   Basic Metabolic Panel: Recent Labs  Lab 03/17/21 1455 03/18/21 0630 03/20/21 0551  NA 128* 130* 137  K 4.6 4.6 4.5  CL 91* 91* 101  CO2 27 25 29   GLUCOSE 111* 136* 155*  BUN 19 26* 27*  CREATININE 0.89 1.06* 0.85  CALCIUM 8.5* 8.4* 8.4*  MG  --   --  2.4  PHOS  --   --  3.3   GFR: Estimated Creatinine  Clearance: 48.4 mL/min (by C-G formula based on SCr of 0.85 mg/dL). Liver Function Tests: Recent Labs  Lab 03/17/21 1455  AST 22  ALT 19  ALKPHOS 54  BILITOT 0.5  PROT 6.5  ALBUMIN 3.1*   No results for input(s): LIPASE, AMYLASE in the last 168 hours. No results for input(s): AMMONIA in the last 168 hours. Coagulation Profile: No results for input(s): INR, PROTIME in the last 168 hours. Cardiac Enzymes: No results for input(s): CKTOTAL, CKMB, CKMBINDEX, TROPONINI in the last 168 hours. BNP (last 3 results) No results for input(s): PROBNP in the last 8760 hours. HbA1C: No results for input(s): HGBA1C in the last 72 hours. CBG: No results for input(s): GLUCAP in the last 168 hours. Lipid Profile: No results for input(s): CHOL, HDL, LDLCALC, TRIG, CHOLHDL, LDLDIRECT in the last 72 hours. Thyroid Function Tests: No results for input(s): TSH, T4TOTAL, FREET4, T3FREE, THYROIDAB in the last 72 hours. Anemia Panel: No results for input(s): VITAMINB12, FOLATE, FERRITIN, TIBC, IRON, RETICCTPCT in the last 72 hours. Sepsis Labs: No results for input(s): PROCALCITON, LATICACIDVEN in the last 168 hours.  Recent Results (from the past 240 hour(s))  Resp Panel by RT-PCR (Flu A&B, Covid) Nasopharyngeal Swab     Status: Abnormal   Collection Time: 03/17/21  2:55 PM   Specimen: Nasopharyngeal Swab; Nasopharyngeal(NP) swabs in vial transport medium  Result Value Ref Range Status   SARS Coronavirus 2 by RT PCR NEGATIVE NEGATIVE Final    Comment: (NOTE) SARS-CoV-2 target nucleic acids are NOT DETECTED.  The SARS-CoV-2 RNA is generally detectable in upper respiratory specimens during the acute phase of infection. The lowest concentration of SARS-CoV-2 viral copies this assay can detect is 138 copies/mL. A negative result does not preclude SARS-Cov-2 infection and should not be used as the sole basis for treatment or other patient management decisions. A negative result may occur with   improper specimen collection/handling, submission of specimen other than nasopharyngeal swab, presence of viral mutation(s) within the areas targeted by this assay, and inadequate number of viral copies(<138 copies/mL). A negative result must be combined with clinical observations, patient history, and epidemiological information. The expected result is Negative.  Fact Sheet for Patients:  EntrepreneurPulse.com.au  Fact Sheet for Healthcare Providers:  IncredibleEmployment.be  This test is no t yet approved or cleared by the [place] FDA and  has been authorized for detection and/or diagnosis of SARS-CoV-2 by FDA under an Emergency Use Authorization (EUA). This EUA will remain  in effect (meaning this test can be used) for the duration of the COVID-19 declaration under Section 564(b)(1) of the Act, 21 U.S.C.section 360bbb-3(b)(1), unless the authorization is terminated  or revoked sooner.       Influenza A by PCR POSITIVE (A) NEGATIVE Final   Influenza B by PCR NEGATIVE NEGATIVE Final    Comment: (NOTE) The Xpert Xpress SARS-CoV-2/FLU/RSV plus assay is intended as an aid in the diagnosis of influenza from Nasopharyngeal swab specimens and should not be used as a sole basis for  treatment. Nasal washings and aspirates are unacceptable for Xpert Xpress SARS-CoV-2/FLU/RSV testing.  Fact Sheet for Patients: EntrepreneurPulse.com.au  Fact Sheet for Healthcare Providers: IncredibleEmployment.be  This test is not yet approved or cleared by the [place] FDA and has been authorized for detection and/or diagnosis of SARS-CoV-2 by FDA under an Emergency Use Authorization (EUA). This EUA will remain in effect (meaning this test can be used) for the duration of the COVID-19 declaration under Section 564(b)(1) of the Act, 21 U.S.C. section 360bbb-3(b)(1), unless the authorization is terminated  or revoked.  Performed at [facility], [address], [place], [place] [address]          Radiology Studies: US Venous Img Lower Bilateral (DVT)  Result Date: 03/19/2021 CLINICAL DATA:  Shortness of breath, leg pain EXAM: BILATERAL LOWER EXTREMITY VENOUS DOPPLER ULTRASOUND TECHNIQUE: Gray-scale sonography with compression, as well as color and duplex ultrasound, were performed to evaluate the deep venous system(s) from the level of the common femoral vein through the popliteal and proximal calf veins. COMPARISON:  None. FINDINGS: VENOUS Normal compressibility of BILATERAL common femoral, superficial femoral, and popliteal veins, as well as the visualized calf veins. Visualized portions of BILATERAL profunda femoral vein and great saphenous vein unremarkable. No filling defects to suggest DVT on grayscale or color Doppler imaging. Doppler waveforms show normal direction of venous flow, normal respiratory plasticity and response to augmentation. OTHER None. Limitations: none IMPRESSION: No evidence of deep venous thrombosis in either lower extremity. Electronically Signed   By: Lavonia Dana M.D.   On: 03/19/2021 09:59        Scheduled Meds:  cholecalciferol  2,000 Units Oral Daily   [START ON 03/31/2021] cyanocobalamin  1,000 mcg Intramuscular Q30 days   enoxaparin (LOVENOX) injection  40 mg Subcutaneous Daily   guaiFENesin  600 mg Oral BID   ipratropium-albuterol  3 mL Nebulization TID   methylPREDNISolone (SOLU-MEDROL) injection  40 mg Intravenous Q12H   multivitamin with minerals  1 tablet Oral Daily   oseltamivir  30 mg Oral BID   pantoprazole  40 mg Oral Daily   Continuous Infusions:  cefTRIAXone (ROCEPHIN)  IV 1 g (03/20/21 0538)     LOS: 2 days    Time spent: 35 minutes    Barb Merino, MD [facility] Pager [phone]

## 2021-03-20 NOTE — Evaluation (Signed)
Occupational Therapy Evaluation Patient Details Name: Ashley Mack MRN: [id number] DOB: 10-29-1945 Today's Date: 03/20/2021   History of Present Illness Pt is a 76 y.o. female with medical history significant for hypertension, anxiety, GERD, migraine, rheumatoid arthritis, fatty liver and COPD, who presented to emergency room with acute onset of worsening dyspnea with associated congested productive cough and wheezing for the last week. Workup +flu.   Clinical Impression   Ashley Mack was seen for OT evaluation this date. Prior to hospital admission, pt was Independent for mobility and ADLs, family assist for IADLs. Pt lives alone, family available 24/7. Pt presents to acute OT demonstrating impaired ADL performance and functional mobility 2/2 decreased activity tolerance and functional strength/ROM/balance deficits.   Pt's granddaughter provided MIN A + HHA for toilet t/f, supervision for pericare. OT provided SBA for lines mgmt and cues for ECS. SpO2 85% on 3L [place] seated EOB, 88-90% on 4L [place]. Pt would benefit from skilled OT to address noted impairments and functional limitations (see below for any additional details) in order to maximize safety and independence while minimizing falls risk and caregiver burden. Upon hospital discharge, recommend HHOT to maximize pt safety and return to PLOF.       Recommendations for follow up therapy are one component of a multi-disciplinary discharge planning process, led by the attending physician.  Recommendations may be updated based on patient status, additional functional criteria and insurance authorization.   Follow Up Recommendations  Home health OT    Assistance Recommended at Discharge Frequent or constant Supervision/Assistance  Patient can return home with the following A little help with walking and/or transfers;A little help with bathing/dressing/bathroom;Assistance with cooking/housework;Help with stairs or ramp for entrance    Functional  Status Assessment  Patient has had a recent decline in their functional status and demonstrates the ability to make significant improvements in function in a reasonable and predictable amount of time.  Equipment Recommendations  BSC/3in1    Recommendations for Other Services       Precautions / Restrictions Precautions Precautions: Fall Restrictions Weight Bearing Restrictions: No      Mobility Bed Mobility Overal bed mobility: Needs Assistance Bed Mobility: Supine to Sit;Sit to Supine     Supine to sit: Supervision;HOB elevated Sit to supine: Supervision;HOB elevated        Transfers Overall transfer level: Needs assistance Equipment used: 1 person hand held assist Transfers: Sit to/from Stand Sit to Stand: Min guard                  Balance Overall balance assessment: Needs assistance Sitting-balance support: Feet supported Sitting balance-Leahy Scale: Good     Standing balance support: Single extremity supported Standing balance-Leahy Scale: Fair                             ADL either performed or assessed with clinical judgement   ADL Overall ADL's : Needs assistance/impaired                                       General ADL Comments: Pt's granddaughter provided MIN A + HHA for toilet t/f, supervision for pericare. OT provided SBA for lines mgmt and cues for ECS.      Pertinent Vitals/Pain Pain Assessment: No/denies pain     Hand Dominance Right   Extremity/Trunk Assessment Upper Extremity Assessment Upper Extremity  Assessment: Generalized weakness   Lower Extremity Assessment Lower Extremity Assessment: Generalized weakness       Communication Communication Communication: No difficulties   Cognition Arousal/Alertness: Awake/alert Behavior During Therapy: WFL for tasks assessed/performed Overall Cognitive Status: Within Functional Limits for tasks assessed                                        General Comments  SpO2 85% on 3L [place] seated EOB, 90% on 4L [place]            Home Living Family/patient expects to be discharged to:: Private residence Living Arrangements: Alone Available Help at Discharge: Family Type of Home: House Home Access: Stairs to enter Technical brewer of Steps: 3 Entrance Stairs-Rails: None Home Layout: One level     Bathroom Shower/Tub: Occupational psychologist: Handicapped height     Home Equipment: Rollator (4 wheels);Cane - single point;Wheelchair - Brewing technologist;Shower seat - built in;Grab bars - tub/shower   Additional Comments: no falls in the last 6 months      Prior Functioning/Environment Prior Level of Function : Needs assist             Mobility Comments: independent for mobility. ADLs Comments: neighbors/family assist with IADLs, pt uses WC for grocery shopping, does not drive, limited vision        OT Problem List: Decreased strength;Decreased range of motion;Decreased activity tolerance;Impaired balance (sitting and/or standing);Decreased safety awareness      OT Treatment/Interventions: Self-care/ADL training;Therapeutic exercise;Energy conservation;DME and/or AE instruction;Therapeutic activities;Patient/family education;Balance training    OT Goals(Current goals can be found in the care plan section) Acute Rehab OT Goals Patient Stated Goal: to go home OT Goal Formulation: With patient/family Time For Goal Achievement: 04/03/21 Potential to Achieve Goals: Good ADL Goals Pt Will Perform Lower Body Dressing: with caregiver independent in assisting;sit to/from stand;with supervision Pt Will Transfer to Toilet: with supervision;ambulating;regular height toilet Pt Will Perform Toileting - Clothing Manipulation and hygiene: with modified independence;sitting/lateral leans  OT Frequency: Min 2X/week    Co-evaluation              AM-PAC OT "6 Clicks" Daily Activity     Outcome Measure Help  from another person eating meals?: None Help from another person taking care of personal grooming?: A Little Help from another person toileting, which includes using toliet, bedpan, or urinal?: A Little Help from another person bathing (including washing, rinsing, drying)?: A Little Help from another person to put on and taking off regular upper body clothing?: None Help from another person to put on and taking off regular lower body clothing?: A Little 6 Click Score: 20   End of Session Equipment Utilized During Treatment: Oxygen  Activity Tolerance: Patient tolerated treatment well Patient left: in bed;with call bell/phone within reach;with bed alarm set;with family/visitor present  OT Visit Diagnosis: Other abnormalities of gait and mobility (R26.89)                Time: 5397-6734 OT Time Calculation (min): 21 min Charges:  OT General Charges $OT Visit: 1 Visit OT Evaluation $OT Eval Low Complexity: 1 Low OT Treatments $Self Care/Home Management : 8-22 mins  Dessie Coma, M.S. OTR/L  03/20/21, 3:10 PM  ascom [phone]

## 2021-03-20 NOTE — Progress Notes (Signed)
*  PRELIMINARY RESULTS* Echocardiogram 2D Echocardiogram has been performed.  Ashley Mack 03/20/2021, 10:10 AM

## 2021-03-21 ENCOUNTER — Inpatient Hospital Stay: Payer: Medicare Other

## 2021-03-21 DIAGNOSIS — J441 Chronic obstructive pulmonary disease with (acute) exacerbation: Secondary | ICD-10-CM | POA: Diagnosis not present

## 2021-03-21 MED ORDER — BUDESONIDE 0.25 MG/2ML IN SUSP
0.2500 mg | Freq: Two times a day (BID) | RESPIRATORY_TRACT | Status: DC
  Administered 2021-03-22: 0.25 mg via RESPIRATORY_TRACT
  Filled 2021-03-21: qty 2

## 2021-03-21 MED ORDER — MAGNESIUM SULFATE 2 GM/50ML IV SOLN
2.0000 g | Freq: Once | INTRAVENOUS | Status: AC
  Administered 2021-03-21: 2 g via INTRAVENOUS
  Filled 2021-03-21: qty 50

## 2021-03-21 MED ORDER — ALPRAZOLAM 0.25 MG PO TABS
0.2500 mg | ORAL_TABLET | Freq: Three times a day (TID) | ORAL | Status: DC | PRN
  Administered 2021-03-22: 0.25 mg via ORAL
  Filled 2021-03-21 (×2): qty 1

## 2021-03-21 MED ORDER — AMLODIPINE BESYLATE 5 MG PO TABS
5.0000 mg | ORAL_TABLET | Freq: Every day | ORAL | Status: DC
  Administered 2021-03-21: 5 mg via ORAL
  Filled 2021-03-21: qty 1

## 2021-03-21 NOTE — Progress Notes (Signed)
RT called to patient bedside due to increased work of breathing. Patient tachypneic on 5L [place], placed on NRBM at this time, patient declines wanting to be placed on Bipap due to claustrophobia of mask. Patient tolerating NRBM well at this time. NP at bedside.

## 2021-03-21 NOTE — Progress Notes (Signed)
Nurse called to patient's room with pt. Stating that she needed the bed up because she could not breath. Sats checked as patient noted to have distressed breathing, sats were 84%. Patient currently on 5LPM O2 and recent neb treatment from respiratory. Lung sounds diminished with wheezing. Respiratory therapist contacted and is on the way to assess patient to see if more and what intervention needs to take place. Patient able to speak, but not in full sentences. Will continue to monitor pt. Until respiratory arrives to room.

## 2021-03-21 NOTE — TOC Progression Note (Signed)
Transition of Care [facility]) - Progression Note    Patient Details  Name: SINDIA KOWALCZYK MRN: [id number] Date of Birth: [birth year]  [facility] of [facility]) CM/SW Contact  Eileen Stanford, LCSW Phone Number: 03/21/2021, 3:12 PM  Clinical Narrative:   Pt has accepted [place] and will need RN and Pt at d/c.    Expected Discharge Plan: [place] Barriers to Discharge: Continued Medical Work up  Expected Discharge Plan and Services Expected Discharge Plan: [place] In-house Referral: NA   Post Acute Care Choice: [place] arrangements for the past 2 months: Single Family Home                                       Social Determinants of Health (SDOH) Interventions    Readmission Risk Interventions No flowsheet data found.

## 2021-03-21 NOTE — Progress Notes (Addendum)
Mobility Specialist - Progress Note   03/21/21 1400  Mobility  Range of Motion/Exercises Active  Level of Assistance Standby assist, set-up cues, supervision of patient - no hands on  Assistive Device None  Distance Ambulated (ft) 0 ft  Mobility Response Tolerated well  Mobility performed by Mobility specialist  $Mobility charge 1 Mobility    Pre-mobility: 114 HR, 94% SpO2 During mobility: 119 HR, 91% SpO2 Post-mobility: 117 HR, 93% SpO2   Pt lying in bed upon arrival, utilizing 4L. Pt declined EOB activity, but agreeable to supine exercises. Pt also appears a little anxious about OOB activity d/t getting worked up earlier during [facility] transfer per [employer]. Pt performed 5 knee flexions and 10 ankle pumps before requesting to hold on further activity to watch soap operas. O2 maintained in 90s. Offered to reposition pt in bed as she is slouched down, but politely declined and stated it was comfortable. Pt left in bed with all needs in reach, alarm set. Family at bedside.    Kathee Delton Mobility Specialist 03/21/21, 2:35 PM

## 2021-03-21 NOTE — Progress Notes (Signed)
PROGRESS NOTE    Ashley Mack  HBZ:[id number] DOB: June 26, 1945 DOA: 03/18/2021 PCP: Kirk Ruths, MD    Brief Narrative:  76 year old with hypertension, anxiety, GERD, migraine and COPD not on home oxygen presented with acute onset of worsening dyspnea with associated congestive productive cough and wheezing for 1 week.  Recently treated with Levaquin and prednisone taper.  In the emergency room blood pressure stable.  Heart rate 110.  95% on 2 L oxygen.  Subsequently required high flow nasal cannula oxygen.  Influenza A positive.  COVID-19 negative.  D-dimer was 6.44.  CTA with no evidence of PE.  Showed emphysema and compression fractures.   Assessment & Plan:   Principal Problem:   COPD exacerbation ([place]) Active Problems:   COPD with acute exacerbation ([place])  COPD with acute exacerbation due to acute influenza A infection: Persistent symptoms of bronchospasm and hypoxemia. Continue aggressive bronchodilator therapy, IV steroids and chest physiotherapy. Continue Rocephin for 5 days. CTA with no evidence of pulmonary embolism, no evidence of pneumonia. 2D echocardiogram essentially normal. Lower extremity duplex is negative for DVT. Repeat chest x-ray today due to persistent shortness of breath with improved aeration.  Leukocytosis: Due to high-dose steroid use.  No evidence of bacterial infection.  Will monitor.  Recheck tomorrow morning.  Essential hypertension, not on treatment.  Will start patient on low-dose amlodipine.    DVT prophylaxis: enoxaparin (LOVENOX) injection 40 mg Start: 03/18/21 1000   Code Status: Full code Family Communication: Daughter at the bedside Disposition Plan: Status is: Inpatient  Remains inpatient appropriate because: Still on oxygen.  Significant tachypnea and tachycardia with mobility.  Consultants:  None  Procedures:  None  Antimicrobials:  Rocephin 1/2--- Tamiflu 1/2--   Subjective:  Seen and examined.  No overnight  events.  Persistent shortness of breath and tachypnea even walking to the bedside commode. SpO2: 97 % O2 Flow Rate (L/min): 5 L/min FiO2 (%): 30 %   Objective: Vitals:   03/20/21 2013 03/21/21 0408 03/21/21 0826 03/21/21 1041  BP: 137/82 (!) 155/75 (!) 160/90 (!) 149/89  Pulse: (!) 107 (!) 107 (!) 118 (!) 115  Resp: 20 20 19 20   Temp: 98.3 F (36.8 C) 98.4 F (36.9 C) 98.6 F (37 C) 98.5 F (36.9 C)  TempSrc: Oral  Oral Oral  SpO2: 95% 98% 93% 97%  Weight:      Height:       No intake or output data in the 24 hours ending 03/21/21 1232  Filed Weights   03/17/21 1444 03/19/21 2148  Weight: 63.5 kg 63 kg    Examination:  General: Anxious.  In mild respiratory stress.  She has very poor vision. Cardiovascular: S1-S2 normal.  Tachycardia.  Regular rate rhythm. Respiratory: On 5 L oxygen.  Bilateral poor air entry but no added sounds. Gastrointestinal: Soft.  Nontender.  Bowel sound present. Ext: No edema.  She does have paresthesia and tenderness both legs. Neuro: Alert and oriented x4.  No focal deficits.     Data Reviewed: I have personally reviewed following labs and imaging studies  CBC: Recent Labs  Lab 03/17/21 1455 03/18/21 0630 03/20/21 0551  WBC 10.9* 15.4* 21.9*  NEUTROABS 9.3*  --  20.5*  HGB 12.2 12.1 10.1*  HCT 38.3 37.3 30.9*  MCV 87.0 86.3 85.4  PLT 368 342 101   Basic Metabolic Panel: Recent Labs  Lab 03/17/21 1455 03/18/21 0630 03/20/21 0551  NA 128* 130* 137  K 4.6 4.6 4.5  CL 91* 91*  101  CO2 27 25 29   GLUCOSE 111* 136* 155*  BUN 19 26* 27*  CREATININE 0.89 1.06* 0.85  CALCIUM 8.5* 8.4* 8.4*  MG  --   --  2.4  PHOS  --   --  3.3   GFR: Estimated Creatinine Clearance: 48.4 mL/min (by C-G formula based on SCr of 0.85 mg/dL). Liver Function Tests: Recent Labs  Lab 03/17/21 1455  AST 22  ALT 19  ALKPHOS 54  BILITOT 0.5  PROT 6.5  ALBUMIN 3.1*   No results for input(s): LIPASE, AMYLASE in the last 168 hours. No results  for input(s): AMMONIA in the last 168 hours. Coagulation Profile: No results for input(s): INR, PROTIME in the last 168 hours. Cardiac Enzymes: No results for input(s): CKTOTAL, CKMB, CKMBINDEX, TROPONINI in the last 168 hours. BNP (last 3 results) No results for input(s): PROBNP in the last 8760 hours. HbA1C: No results for input(s): HGBA1C in the last 72 hours. CBG: No results for input(s): GLUCAP in the last 168 hours. Lipid Profile: No results for input(s): CHOL, HDL, LDLCALC, TRIG, CHOLHDL, LDLDIRECT in the last 72 hours. Thyroid Function Tests: No results for input(s): TSH, T4TOTAL, FREET4, T3FREE, THYROIDAB in the last 72 hours. Anemia Panel: No results for input(s): VITAMINB12, FOLATE, FERRITIN, TIBC, IRON, RETICCTPCT in the last 72 hours. Sepsis Labs: No results for input(s): PROCALCITON, LATICACIDVEN in the last 168 hours.  Recent Results (from the past 240 hour(s))  Resp Panel by RT-PCR (Flu A&B, Covid) Nasopharyngeal Swab     Status: Abnormal   Collection Time: 03/17/21  2:55 PM   Specimen: Nasopharyngeal Swab; Nasopharyngeal(NP) swabs in vial transport medium  Result Value Ref Range Status   SARS Coronavirus 2 by RT PCR NEGATIVE NEGATIVE Final    Comment: (NOTE) SARS-CoV-2 target nucleic acids are NOT DETECTED.  The SARS-CoV-2 RNA is generally detectable in upper respiratory specimens during the acute phase of infection. The lowest concentration of SARS-CoV-2 viral copies this assay can detect is 138 copies/mL. A negative result does not preclude SARS-Cov-2 infection and should not be used as the sole basis for treatment or other patient management decisions. A negative result may occur with  improper specimen collection/handling, submission of specimen other than nasopharyngeal swab, presence of viral mutation(s) within the areas targeted by this assay, and inadequate number of viral copies(<138 copies/mL). A negative result must be combined with clinical  observations, patient history, and epidemiological information. The expected result is Negative.  Fact Sheet for Patients:  EntrepreneurPulse.com.au  Fact Sheet for Healthcare Providers:  IncredibleEmployment.be  This test is no t yet approved or cleared by the [place] FDA and  has been authorized for detection and/or diagnosis of SARS-CoV-2 by FDA under an Emergency Use Authorization (EUA). This EUA will remain  in effect (meaning this test can be used) for the duration of the COVID-19 declaration under Section 564(b)(1) of the Act, 21 U.S.C.section 360bbb-3(b)(1), unless the authorization is terminated  or revoked sooner.       Influenza A by PCR POSITIVE (A) NEGATIVE Final   Influenza B by PCR NEGATIVE NEGATIVE Final    Comment: (NOTE) The Xpert Xpress SARS-CoV-2/FLU/RSV plus assay is intended as an aid in the diagnosis of influenza from Nasopharyngeal swab specimens and should not be used as a sole basis for treatment. Nasal washings and aspirates are unacceptable for Xpert Xpress SARS-CoV-2/FLU/RSV testing.  Fact Sheet for Patients: EntrepreneurPulse.com.au  Fact Sheet for Healthcare Providers: IncredibleEmployment.be  This test is not yet approved or  cleared by the [place] and has been authorized for detection and/or diagnosis of SARS-CoV-2 by FDA under an Emergency Use Authorization (EUA). This EUA will remain in effect (meaning this test can be used) for the duration of the COVID-19 declaration under Section 564(b)(1) of the Act, 21 U.S.C. section 360bbb-3(b)(1), unless the authorization is terminated or revoked.  Performed at [facility], [address], [place], [place] [address]          Radiology Studies: DG Chest [place] 1 View  Result Date: 03/21/2021 CLINICAL DATA:  Shortness of breath EXAM: PORTABLE CHEST 1 VIEW COMPARISON:  Previous studies including the  examination of 03/17/2021 FINDINGS: Cardiac size is unremarkable. Thoracic aorta is tortuous and ectatic. There are no signs of pulmonary edema. There is crowding of markings in the lower lung fields with interval improvement. No new focal infiltrates are seen. There is no pleural effusion or pneumothorax. There is evidence of vertebroplasty in the multiple thoracic vertebral bodies IMPRESSION: There is interval improvement in aeration of lower lung fields suggesting resolving subsegmental atelectasis. There are no new infiltrates or signs of pulmonary edema. Electronically Signed   By: Elmer Picker M.D.   On: 03/21/2021 12:06   ECHOCARDIOGRAM COMPLETE  Result Date: 03/20/2021    ECHOCARDIOGRAM REPORT   Patient Name:   MATTILYN CRITES Date of Exam: 03/20/2021 Medical Rec #:  [id number]     Height:       62.0 in Accession #:    [id number]    Weight:       139.0 lb Date of Birth:  1946-03-01     BSA:          1.638 m Patient Age:    14 years      BP:           128/78 mmHg Patient Gender: F             HR:           112 bpm. Exam Location:  [facility] Procedure: 2D Echo, Color Doppler and Cardiac Doppler Indications:     R06.00 Dyspnea  History:         Patient has no prior history of Echocardiogram examinations.                  COPD, Signs/Symptoms:Shortness of Breath; Risk                  Factors:Hypertension. Positive for influenza type A.  Sonographer:     Charmayne Sheer Referring Phys:  [id number] Barb Merino Diagnosing Phys: Isaias Cowman MD  Sonographer Comments: Suboptimal parasternal window and suboptimal subcostal window. Image acquisition challenging due to COPD. IMPRESSIONS  1. Left ventricular ejection fraction, by estimation, is 55 to 60%. The left ventricle has normal function. The left ventricle has no regional wall motion abnormalities. Left ventricular diastolic parameters are consistent with Grade I diastolic dysfunction (impaired relaxation).  2. Right ventricular systolic function is normal. The  right ventricular size is normal.  3. The mitral valve is normal in structure. Mild mitral valve regurgitation. No evidence of mitral stenosis.  4. The aortic valve is normal in structure. Aortic valve regurgitation is not visualized. No aortic stenosis is present.  5. The inferior vena cava is normal in size with greater than 50% respiratory variability, suggesting right atrial pressure of 3 mmHg. FINDINGS  Left Ventricle: Left ventricular ejection fraction, by estimation, is 55 to 60%. The left ventricle has normal function. The left  ventricle has no regional wall motion abnormalities. The left ventricular internal cavity size was normal in size. There is  no left ventricular hypertrophy. Left ventricular diastolic parameters are consistent with Grade I diastolic dysfunction (impaired relaxation). Right Ventricle: The right ventricular size is normal. No increase in right ventricular wall thickness. Right ventricular systolic function is normal. Left Atrium: Left atrial size was normal in size. Right Atrium: Right atrial size was normal in size. Pericardium: There is no evidence of pericardial effusion. Mitral Valve: The mitral valve is normal in structure. Mild mitral valve regurgitation. No evidence of mitral valve stenosis. MV peak gradient, 5.3 mmHg. The mean mitral valve gradient is 3.0 mmHg. Tricuspid Valve: The tricuspid valve is normal in structure. Tricuspid valve regurgitation is mild . No evidence of tricuspid stenosis. Aortic Valve: The aortic valve is normal in structure. Aortic valve regurgitation is not visualized. No aortic stenosis is present. Aortic valve mean gradient measures 4.0 mmHg. Aortic valve peak gradient measures 7.2 mmHg. Aortic valve area, by VTI measures 3.56 cm. Pulmonic Valve: The pulmonic valve was normal in structure. Pulmonic valve regurgitation is not visualized. No evidence of pulmonic stenosis. Aorta: The aortic root is normal in size and structure. Venous: The inferior vena  cava is normal in size with greater than 50% respiratory variability, suggesting right atrial pressure of 3 mmHg. IAS/Shunts: No atrial level shunt detected by color flow Doppler.  LEFT VENTRICLE PLAX 2D LVOT diam:     2.20 cm     Diastology LV SV:         84          LV e' medial:    6.42 cm/s LV SV Index:   52          LV E/e' medial:  16.5 LVOT Area:     3.80 cm    LV e' lateral:   7.07 cm/s                            LV E/e' lateral: 15.0  LV Volumes (MOD) LV vol d, MOD A2C: 48.6 ml LV vol d, MOD A4C: 62.2 ml LV vol s, MOD A2C: 24.0 ml LV vol s, MOD A4C: 32.5 ml LV SV MOD A2C:     24.6 ml LV SV MOD A4C:     62.2 ml LV SV MOD BP:      28.9 ml RIGHT VENTRICLE RV Basal diam:  3.16 cm LEFT ATRIUM             Index        RIGHT ATRIUM           Index LA Vol (A2C):   26.6 ml 16.24 ml/m  RA Area:     10.30 cm LA Vol (A4C):   23.0 ml 14.04 ml/m  RA Volume:   21.80 ml  13.31 ml/m LA Biplane Vol: 26.3 ml 16.06 ml/m  AORTIC VALVE                    PULMONIC VALVE AV Area (Vmax):    3.49 cm     PV Vmax:       1.20 m/s AV Area (Vmean):   3.31 cm     PV Vmean:      83.500 cm/s AV Area (VTI):     3.56 cm     PV VTI:        0.205 m AV Vmax:  134.00 cm/s  PV Peak grad:  5.8 mmHg AV Vmean:          98.100 cm/s  PV Mean grad:  3.0 mmHg AV VTI:            0.237 m AV Peak Grad:      7.2 mmHg AV Mean Grad:      4.0 mmHg LVOT Vmax:         123.00 cm/s LVOT Vmean:        85.500 cm/s LVOT VTI:          0.222 m LVOT/AV VTI ratio: 0.94  AORTA Ao Root diam: 2.80 cm MITRAL VALVE MV Area (PHT): 7.09 cm     SHUNTS MV Area VTI:   5.62 cm     Systemic VTI:  0.22 m MV Peak grad:  5.3 mmHg     Systemic Diam: 2.20 cm MV Mean grad:  3.0 mmHg MV Vmax:       1.15 m/s MV Vmean:      77.5 cm/s MV Decel Time: 107 msec MV E velocity: 106.00 cm/s MV A velocity: 113.00 cm/s MV E/A ratio:  0.94 Isaias Cowman MD Electronically signed by Isaias Cowman MD Signature Date/Time: 03/20/2021/1:43:48 PM    Final         Scheduled  Meds:  cholecalciferol  2,000 Units Oral Daily   [START ON 03/31/2021] cyanocobalamin  1,000 mcg Intramuscular Q30 days   enoxaparin (LOVENOX) injection  40 mg Subcutaneous Daily   guaiFENesin  600 mg Oral BID   ipratropium-albuterol  3 mL Nebulization TID   methylPREDNISolone (SOLU-MEDROL) injection  40 mg Intravenous Q12H   multivitamin with minerals  1 tablet Oral Daily   oseltamivir  30 mg Oral BID   pantoprazole  40 mg Oral Daily   Continuous Infusions:  cefTRIAXone (ROCEPHIN)  IV 1 g (03/21/21 0513)     LOS: 3 days    Time spent: 35 minutes    Barb Merino, MD [facility] Pager [phone]

## 2021-03-22 LAB — CBC WITH DIFFERENTIAL/PLATELET
Abs Immature Granulocytes: 0.36 10*3/uL — ABNORMAL HIGH (ref 0.00–0.07)
Basophils Absolute: 0.1 10*3/uL (ref 0.0–0.1)
Basophils Relative: 0 %
Eosinophils Absolute: 0 10*3/uL (ref 0.0–0.5)
Eosinophils Relative: 0 %
HCT: 35.8 % — ABNORMAL LOW (ref 36.0–46.0)
Hemoglobin: 11.5 g/dL — ABNORMAL LOW (ref 12.0–15.0)
Immature Granulocytes: 2 %
Lymphocytes Relative: 2 %
Lymphs Abs: 0.4 10*3/uL — ABNORMAL LOW (ref 0.7–4.0)
MCH: 27.3 pg (ref 26.0–34.0)
MCHC: 32.1 g/dL (ref 30.0–36.0)
MCV: 84.8 fL (ref 80.0–100.0)
Monocytes Absolute: 0.8 10*3/uL (ref 0.1–1.0)
Monocytes Relative: 4 %
Neutro Abs: 19 10*3/uL — ABNORMAL HIGH (ref 1.7–7.7)
Neutrophils Relative %: 92 %
Platelets: 414 10*3/uL — ABNORMAL HIGH (ref 150–400)
RBC: 4.22 MIL/uL (ref 3.87–5.11)
RDW: 16 % — ABNORMAL HIGH (ref 11.5–15.5)
WBC: 20.6 10*3/uL — ABNORMAL HIGH (ref 4.0–10.5)
nRBC: 0 % (ref 0.0–0.2)

## 2021-03-22 MED ORDER — METHYLPREDNISOLONE SODIUM SUCC 40 MG IJ SOLR
40.0000 mg | INTRAMUSCULAR | Status: DC
  Administered 2021-03-23: 40 mg via INTRAVENOUS
  Filled 2021-03-22: qty 1

## 2021-03-22 NOTE — Progress Notes (Signed)
PROGRESS NOTE    Ashley Mack  QJJ:[id number] DOB: Feb 09, 1946 DOA: 03/18/2021 PCP: Kirk Ruths, MD    Brief Narrative:  76 year old with hypertension, anxiety, GERD, migraine and COPD not on home oxygen presented with acute onset of worsening dyspnea with associated congestive productive cough and wheezing for 1 week.  Recently treated with Levaquin and prednisone taper.  In the emergency room blood pressure stable.  Heart rate 110.  95% on 2 L oxygen.  Subsequently required high flow nasal cannula oxygen.  Influenza A positive.  COVID-19 negative.  D-dimer was 6.44.  CTA with no evidence of PE.  Showed emphysema and compression fractures.   Assessment & Plan:   Principal Problem:   COPD exacerbation ([place]) Active Problems:   COPD with acute exacerbation ([place])  COPD with acute exacerbation due to acute influenza A infection: Persistent symptoms of bronchospasm and hypoxemia.  Still with significant findings. Continue aggressive bronchodilator therapy, IV steroids and chest physiotherapy. Completed 5 days of IV Rocephin. CTA with no evidence of pulmonary embolism, no evidence of pneumonia. 2D echocardiogram essentially normal. Lower extremity duplex is negative for DVT. Repeat chest x-ray with improved aeration and no evidence of complications. Due to significant persistent findings, discussed with pulmonary for consultation.  Leukocytosis: Due to high-dose steroid use.  No evidence of bacterial infection.  Will monitor.    Essential hypertension, not on treatment.  Hydralazine as needed.  Started on amlodipine that patient did not tolerate with flushing and palpitation.  Will discontinue.    DVT prophylaxis: enoxaparin (LOVENOX) injection 40 mg Start: 03/18/21 1000   Code Status: Full code Family Communication: None today. Disposition Plan: Status is: Inpatient  Remains inpatient appropriate because: Still on oxygen.  Significant tachypnea and tachycardia with  mobility.  Consultants:  Pulmonary  Procedures:  None  Antimicrobials:  Rocephin 1/2--- 1/6 Tamiflu 1/3--1/7   Subjective:  Patient seen and examined.  Currently feels better.  Still on 5 L oxygen. Had episode of bronchospasm, sweating, diaphoresis and flushing after receiving amlodipine yesterday.  I will update as intolerance.  SpO2: 97 % O2 Flow Rate (L/min): 5 L/min FiO2 (%): 30 %   Objective: Vitals:   03/21/21 2308 03/22/21 0258 03/22/21 0734 03/22/21 0841  BP:  (!) 157/91  (!) 145/79  Pulse:  (!) 109  (!) 105  Resp:  20  18  Temp:  98.3 F (36.8 C)  98 F (36.7 C)  TempSrc:  Oral    SpO2: 96% 96% 93% 97%  Weight:      Height:        Intake/Output Summary (Last 24 hours) at 03/22/2021 1150 Last data filed at 03/22/2021 0409 Gross per 24 hour  Intake 0 ml  Output 400 ml  Net -400 ml    Filed Weights   03/17/21 1444 03/19/21 2148  Weight: 63.5 kg 63 kg    Examination:  General: Anxious.  In mild respiratory stress.  She has very poor vision. Cardiovascular: S1-S2 normal.  Tachycardia.  Regular rate rhythm. Respiratory: On 5 L oxygen.  Bilateral poor air entry but no added sounds. Gastrointestinal: Soft.  Nontender.  Bowel sound present. Ext: She has some chronic nonpitting edema. Neuro: Alert and oriented x4.  No focal deficits.     Data Reviewed: I have personally reviewed following labs and imaging studies  CBC: Recent Labs  Lab 03/17/21 1455 03/18/21 0630 03/20/21 0551 03/22/21 0310  WBC 10.9* 15.4* 21.9* 20.6*  NEUTROABS 9.3*  --  20.5* 19.0*  HGB 12.2 12.1 10.1* 11.5*  HCT 38.3 37.3 30.9* 35.8*  MCV 87.0 86.3 85.4 84.8  PLT 368 342 364 016*   Basic Metabolic Panel: Recent Labs  Lab 03/17/21 1455 03/18/21 0630 03/20/21 0551  NA 128* 130* 137  K 4.6 4.6 4.5  CL 91* 91* 101  CO2 27 25 29   GLUCOSE 111* 136* 155*  BUN 19 26* 27*  CREATININE 0.89 1.06* 0.85  CALCIUM 8.5* 8.4* 8.4*  MG  --   --  2.4  PHOS  --   --  3.3    GFR: Estimated Creatinine Clearance: 48.4 mL/min (by C-G formula based on SCr of 0.85 mg/dL). Liver Function Tests: Recent Labs  Lab 03/17/21 1455  AST 22  ALT 19  ALKPHOS 54  BILITOT 0.5  PROT 6.5  ALBUMIN 3.1*   No results for input(s): LIPASE, AMYLASE in the last 168 hours. No results for input(s): AMMONIA in the last 168 hours. Coagulation Profile: No results for input(s): INR, PROTIME in the last 168 hours. Cardiac Enzymes: No results for input(s): CKTOTAL, CKMB, CKMBINDEX, TROPONINI in the last 168 hours. BNP (last 3 results) No results for input(s): PROBNP in the last 8760 hours. HbA1C: No results for input(s): HGBA1C in the last 72 hours. CBG: No results for input(s): GLUCAP in the last 168 hours. Lipid Profile: No results for input(s): CHOL, HDL, LDLCALC, TRIG, CHOLHDL, LDLDIRECT in the last 72 hours. Thyroid Function Tests: No results for input(s): TSH, T4TOTAL, FREET4, T3FREE, THYROIDAB in the last 72 hours. Anemia Panel: No results for input(s): VITAMINB12, FOLATE, FERRITIN, TIBC, IRON, RETICCTPCT in the last 72 hours. Sepsis Labs: No results for input(s): PROCALCITON, LATICACIDVEN in the last 168 hours.  Recent Results (from the past 240 hour(s))  Resp Panel by RT-PCR (Flu A&B, Covid) Nasopharyngeal Swab     Status: Abnormal   Collection Time: 03/17/21  2:55 PM   Specimen: Nasopharyngeal Swab; Nasopharyngeal(NP) swabs in vial transport medium  Result Value Ref Range Status   SARS Coronavirus 2 by RT PCR NEGATIVE NEGATIVE Final    Comment: (NOTE) SARS-CoV-2 target nucleic acids are NOT DETECTED.  The SARS-CoV-2 RNA is generally detectable in upper respiratory specimens during the acute phase of infection. The lowest concentration of SARS-CoV-2 viral copies this assay can detect is 138 copies/mL. A negative result does not preclude SARS-Cov-2 infection and should not be used as the sole basis for treatment or other patient management decisions. A  negative result may occur with  improper specimen collection/handling, submission of specimen other than nasopharyngeal swab, presence of viral mutation(s) within the areas targeted by this assay, and inadequate number of viral copies(<138 copies/mL). A negative result must be combined with clinical observations, patient history, and epidemiological information. The expected result is Negative.  Fact Sheet for Patients:  EntrepreneurPulse.com.au  Fact Sheet for Healthcare Providers:  IncredibleEmployment.be  This test is no t yet approved or cleared by the [place] FDA and  has been authorized for detection and/or diagnosis of SARS-CoV-2 by FDA under an Emergency Use Authorization (EUA). This EUA will remain  in effect (meaning this test can be used) for the duration of the COVID-19 declaration under Section 564(b)(1) of the Act, 21 U.S.C.section 360bbb-3(b)(1), unless the authorization is terminated  or revoked sooner.       Influenza A by PCR POSITIVE (A) NEGATIVE Final   Influenza B by PCR NEGATIVE NEGATIVE Final    Comment: (NOTE) The Xpert Xpress SARS-CoV-2/FLU/RSV plus assay is intended as an aid  in the diagnosis of influenza from Nasopharyngeal swab specimens and should not be used as a sole basis for treatment. Nasal washings and aspirates are unacceptable for Xpert Xpress SARS-CoV-2/FLU/RSV testing.  Fact Sheet for Patients: EntrepreneurPulse.com.au  Fact Sheet for Healthcare Providers: IncredibleEmployment.be  This test is not yet approved or cleared by the [place] FDA and has been authorized for detection and/or diagnosis of SARS-CoV-2 by FDA under an Emergency Use Authorization (EUA). This EUA will remain in effect (meaning this test can be used) for the duration of the COVID-19 declaration under Section 564(b)(1) of the Act, 21 U.S.C. section 360bbb-3(b)(1), unless the  authorization is terminated or revoked.  Performed at [facility], [address], [place], [place] [address]          Radiology Studies: DG Chest [place] 1 View  Result Date: 03/21/2021 CLINICAL DATA:  Shortness of breath EXAM: PORTABLE CHEST 1 VIEW COMPARISON:  Previous studies including the examination of 03/17/2021 FINDINGS: Cardiac size is unremarkable. Thoracic aorta is tortuous and ectatic. There are no signs of pulmonary edema. There is crowding of markings in the lower lung fields with interval improvement. No new focal infiltrates are seen. There is no pleural effusion or pneumothorax. There is evidence of vertebroplasty in the multiple thoracic vertebral bodies IMPRESSION: There is interval improvement in aeration of lower lung fields suggesting resolving subsegmental atelectasis. There are no new infiltrates or signs of pulmonary edema. Electronically Signed   By: Elmer Picker M.D.   On: 03/21/2021 12:06        Scheduled Meds:  budesonide (PULMICORT) nebulizer solution  0.25 mg Nebulization BID   cholecalciferol  2,000 Units Oral Daily   [START ON 03/31/2021] cyanocobalamin  1,000 mcg Intramuscular Q30 days   enoxaparin (LOVENOX) injection  40 mg Subcutaneous Daily   guaiFENesin  600 mg Oral BID   ipratropium-albuterol  3 mL Nebulization TID   methylPREDNISolone (SOLU-MEDROL) injection  40 mg Intravenous Q12H   multivitamin with minerals  1 tablet Oral Daily   oseltamivir  30 mg Oral BID   pantoprazole  40 mg Oral Daily   Continuous Infusions:     LOS: 4 days    Time spent: 35 minutes    Barb Merino, MD [facility] Pager [phone]

## 2021-03-22 NOTE — Progress Notes (Signed)
Physical Therapy Treatment Patient Details Name: Ashley Mack MRN: [id number] DOB: Oct 29, 1945 Today's Date: 03/22/2021   History of Present Illness Pt is a 76 y.o. female with medical history significant for hypertension, anxiety, GERD, migraine, rheumatoid arthritis, fatty liver and COPD, who presented to emergency room with acute onset of worsening dyspnea with associated congested productive cough and wheezing for the last week. Workup +flu.    PT Comments    Pt received in bed on 5L O2 via [place] with sats at 99% at rest, 97% while talking with therapist. Pt tolerated LE AROM exercises with rest breaks. Demonstrated good bed mobility with Supervision supine <> sit EOB. Pt completed sit to stand at [facility] with supervision, static standing at [facility] for 1 minute increments, and marching in place for 15 second increments (with several minutes in between). Pt overall tolerated session well. O2 sats did decrease to mid 80's with minimal exertion requiring increased time to recover through rest and PLB technique. Pt will benefit from short term stay at SNF to regain functional independence prior to returning home.   Recommendations for follow up therapy are one component of a multi-disciplinary discharge planning process, led by the attending physician.  Recommendations may be updated based on patient status, additional functional criteria and insurance authorization.  Follow Up Recommendations  Skilled nursing-short term rehab (<3 hours/day)     Assistance Recommended at Discharge Frequent or constant Supervision/Assistance  Patient can return home with the following A little help with walking and/or transfers;A lot of help with bathing/dressing/bathroom;Assist for transportation;Assistance with cooking/housework;Help with stairs or ramp for entrance   Equipment Recommendations  Rolling walker (2 wheels);BSC/3in1    Recommendations for Other Services       Precautions / Restrictions  Precautions Precautions: Fall Restrictions Weight Bearing Restrictions: No     Mobility  Bed Mobility Overal bed mobility: Needs Assistance Bed Mobility: Supine to Sit;Sit to Supine     Supine to sit: Supervision;HOB elevated Sit to supine: Supervision;HOB elevated        Transfers Overall transfer level: Needs assistance Equipment used: Rolling walker (2 wheels) Transfers: Sit to/from Stand Sit to Stand: Supervision                Ambulation/Gait             Pre-gait activities:  (Static standing in one minute increments, 15 second marching at [employer])     Marine scientist [place] (Stroke Patients Only)       Balance Overall balance assessment: Needs assistance Sitting-balance support: Feet supported Sitting balance-Leahy Scale: Good                                      Cognition Arousal/Alertness: Awake/alert Behavior During Therapy: WFL for tasks assessed/performed Overall Cognitive Status: Within Functional Limits for tasks assessed                                          Exercises General Exercises - Lower Extremity Ankle Circles/Pumps: AROM;Both;15 reps Heel Slides: AROM;Both;10 reps Hip ABduction/ADduction: AROM;Both;10 reps    General Comments General comments (skin integrity, edema, etc.): Pt on 5L O2 via       Pertinent Vitals/Pain Pain Assessment: No/denies  pain    Home Living                          Prior Function            PT Goals (current goals can now be found in the care plan section) Acute Rehab PT Goals Patient Stated Goal: to go home    Frequency    Min 2X/week      PT Plan Current plan remains appropriate    Co-evaluation              AM-PAC PT "6 Clicks" Mobility   Outcome Measure  Help needed turning from your back to your side while in a flat bed without using bedrails?: None Help needed moving from  lying on your back to sitting on the side of a flat bed without using bedrails?: A Little Help needed moving to and from a bed to a chair (including a wheelchair)?: A Little Help needed standing up from a chair using your arms (e.g., wheelchair or bedside chair)?: A Little Help needed to walk in hospital room?: A Little Help needed climbing 3-5 steps with a railing? : A Lot 6 Click Score: 18    End of Session Equipment Utilized During Treatment: Oxygen Activity Tolerance: Other (comment) (Limited by SOB requiring multiple rest breaks) Patient left: in bed;with call bell/phone within reach Nurse Communication: Mobility status PT Visit Diagnosis: Other abnormalities of gait and mobility (R26.89);Difficulty in walking, not elsewhere classified (R26.2);Muscle weakness (generalized) (M62.81)     Time: 7062-3762 PT Time Calculation (min) (ACUTE ONLY): 41 min  Charges:  $Therapeutic Exercise: 8-22 mins $Therapeutic Activity: 23-37 mins                    Mikel Cella, PTA    Josie Dixon 03/22/2021, 10:07 AM

## 2021-03-22 NOTE — TOC Progression Note (Addendum)
Transition of Care [facility]) - Progression Note    Patient Details  Name: Ashley Mack MRN: [id number] Date of Birth: 1945-09-30  Transition of Care [facility] CM/SW [place], LCSW Phone Number: 03/22/2021, 10:03 AM  Clinical Narrative:    Spoke to patient regarding DME recs. Patient says she already has a RW at home. Has a shower seat but no BSC.  Patient says she would want a BSC if there is no copay.  Called Jasmine with [employer] who is going to verify if there is a copay.  HH has already been set up through [facility] according to [facility] notes. Patient says she lives alone, but her daughter Ashley Mack will be staying with her at time of DC for support.  10:20- Per Jasmine with [employer], patient would need to pay $45 for a BSC. Cancelled order as patient only wanted it if she does not have to pay.   Expected Discharge Plan: [place] Barriers to Discharge: Continued Medical Work up  Expected Discharge Plan and Services Expected Discharge Plan: [place] In-house Referral: NA   Post Acute Care Choice: [place] arrangements for the past 2 months: Single Family Home                                       Social Determinants of Health (SDOH) Interventions    Readmission Risk Interventions No flowsheet data found.

## 2021-03-22 NOTE — Progress Notes (Signed)
°   03/21/21 0826  Assess: MEWS Score  Temp 98.6 F (37 C)  BP (!) 160/90  Pulse Rate (!) 118  Resp 19  SpO2 93 %  O2 Device Nasal Cannula  Assess: MEWS Score  MEWS Temp 0  MEWS Systolic 0  MEWS Pulse 2  MEWS RR 0  MEWS LOC 0  MEWS Score 2  MEWS Score Color Yellow  Assess: if the MEWS score is Yellow or Red  Were vital signs taken at a resting state? Yes  Focused Assessment No change from prior assessment  Does the patient meet 2 or more of the SIRS criteria? No  MEWS guidelines implemented *See Row Information* Yes  Treat  MEWS Interventions Administered scheduled meds/treatments  Pain Scale 0-10  Pain Score 0  Take Vital Signs  Increase Vital Sign Frequency  Yellow: Q 2hr X 2 then Q 4hr X 2, if remains yellow, continue Q 4hrs  Escalate  MEWS: Escalate Yellow: discuss with charge nurse/RN and consider discussing with provider and RRT  Notify: Charge Nurse/RN  Name of Charge Nurse/RN Notified Olivia  Date Charge Nurse/RN Notified 03/21/21  Time Charge Nurse/RN Notified 0845  Assess: SIRS CRITERIA  SIRS Temperature  0  SIRS Pulse 1  SIRS Respirations  0  SIRS WBC 0  SIRS Score Sum  1

## 2021-03-22 NOTE — Plan of Care (Signed)
°  Problem: Education: Goal: Knowledge of disease or condition will improve Outcome: Progressing Goal: Knowledge of the prescribed therapeutic regimen will improve Outcome: Progressing Goal: Individualized Educational Video(s) Outcome: Progressing   Problem: Activity: Goal: Ability to tolerate increased activity will improve Outcome: Progressing Goal: Will verbalize the importance of balancing activity with adequate rest periods Outcome: Progressing   Problem: Respiratory: Goal: Ability to maintain a clear airway will improve Outcome: Progressing Goal: Levels of oxygenation will improve Outcome: Progressing Goal: Ability to maintain adequate ventilation will improve Outcome: Progressing   Problem: Education: Goal: Knowledge of General Education information will improve Description: Including pain rating scale, medication(s)/side effects and non-pharmacologic comfort measures Outcome: Progressing   Problem: Health Behavior/Discharge Planning: Goal: Ability to manage health-related needs will improve Outcome: Progressing   Problem: Clinical Measurements: Goal: Ability to maintain clinical measurements within normal limits will improve Outcome: Progressing Goal: Will remain free from infection Outcome: Progressing Goal: Diagnostic test results will improve Outcome: Progressing Goal: Respiratory complications will improve Outcome: Progressing Goal: Cardiovascular complication will be avoided Outcome: Progressing   Problem: Activity: Goal: Risk for activity intolerance will decrease Outcome: Progressing   Problem: Nutrition: Goal: Adequate nutrition will be maintained Outcome: Progressing   Problem: Coping: Goal: Level of anxiety will decrease Outcome: Progressing   Problem: Elimination: Goal: Will not experience complications related to bowel motility Outcome: Progressing Goal: Will not experience complications related to urinary retention Outcome: Progressing    Problem: Pain Managment: Goal: General experience of comfort will improve Outcome: Progressing   Problem: Safety: Goal: Ability to remain free from injury will improve Outcome: Progressing   Problem: Safety: Goal: Ability to remain free from injury will improve Outcome: Progressing   Problem: Skin Integrity: Goal: Risk for impaired skin integrity will decrease Outcome: Progressing

## 2021-03-22 NOTE — Consult Note (Signed)
PULMONOLOGY         Date: 03/22/2021,   MRN# [id number] Ashley Mack [birth year]     AdmissionWeight: 63.5 kg                 CurrentWeight: 64 kg   Referring physician: Dr Sloan Leiter   CHIEF COMPLAINT:   Increased O2 requirement   HISTORY OF PRESENT ILLNESS   This is a 76 yo F with hx of anemia, anxiety, OA, RA, retinopathy, NASH, CKD, chronic migraines, osteoporosis, emphysema who came in with worsening SOB/DOE.  Seh was seen at urgent care and post treatment continued to deteriorate.  Influenza A came back positive and influenza antigen was negative.  COVID-19 PCR came back negative.  D-dimer was significantly elevated at 6.44.After 5d treatment patient has not reached improvement substantially and requires incresead O2 with PCCM consultation for further evaluation and management.    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[place])    Iron (Fe) deficiency anemia    Migraine headache    in past   Migraines    Motion sickness    No natural teeth    Osteoporosis    Rheumatoid arthritis ([place])    Vertigo      SURGICAL HISTORY   Past Surgical History:  Procedure Laterality Date   CATARACT EXTRACTION W/PHACO Right 08/12/2016   Procedure: CATARACT EXTRACTION PHACO AND INTRAOCULAR LENS PLACEMENT ([place])  Right;  Surgeon: Leandrew Koyanagi, MD;  Location: [facility];  Service: Ophthalmology;  Laterality: Right;   CATARACT EXTRACTION W/PHACO Left 09/30/2016   Procedure: CATARACT EXTRACTION PHACO AND INTRAOCULAR LENS PLACEMENT ([place]) Left;  Surgeon: Leandrew Koyanagi, MD;  Location: [facility];   Service: Ophthalmology;  Laterality: Left;   COLONOSCOPY     COLONOSCOPY WITH PROPOFOL N/A 12/09/2016   Procedure: COLONOSCOPY WITH PROPOFOL;  Surgeon: Toledo, Benay Pike, MD;  Location: [facility];  Service: Endoscopy;  Laterality: N/A;   ESOPHAGOGASTRODUODENOSCOPY     ESOPHAGOGASTRODUODENOSCOPY (EGD) WITH PROPOFOL N/A 12/09/2016   Procedure: ESOPHAGOGASTRODUODENOSCOPY (EGD) WITH PROPOFOL;  Surgeon: Toledo, Benay Pike, MD;  Location: [facility];  Service: Endoscopy;  Laterality: N/A;   KYPHOPLASTY N/A 08/10/2017   Procedure: Hewitt Shorts;  Surgeon: Hessie Knows, MD;  Location: [facility];  Service: Orthopedics;  Laterality: N/A;   KYPHOPLASTY N/A 09/09/2017   Procedure: [id number];  Surgeon: Hessie Knows, MD;  Location: [facility];  Service: Orthopedics;  Laterality: N/A;   KYPHOPLASTY N/A 09/28/2017   Procedure: [id number];  Surgeon: Hessie Knows, MD;  Location: [facility];  Service: Orthopedics;  Laterality: N/A;   KYPHOPLASTY N/A 06/19/2019   Procedure: KYPHOPLASTY T5;  Surgeon: Hessie Knows, MD;  Location: [facility];  Service: Orthopedics;  Laterality: N/A;   OOPHORECTOMY Bilateral 2003   RIGHT/LEFT HEART CATH AND CORONARY ANGIOGRAPHY Bilateral 10/19/2019   Procedure: RIGHT/LEFT HEART CATH AND CORONARY ANGIOGRAPHY;  Surgeon: Yolonda Kida, MD;  Location: [facility];  Service: Cardiovascular;  Laterality: Bilateral;     FAMILY HISTORY   Family History  Problem Relation Age of  Onset   Kidney disease Mother    Diabetes Mellitus II Brother      SOCIAL HISTORY   Social History   Tobacco Use   Smoking status: Former    Packs/day: 1.00    Types: Cigarettes    Quit date: 2009    Years since quitting: 14.0   Smokeless tobacco: Never  Vaping Use   Vaping Use: Never used  Substance Use Topics   Alcohol use: No   Drug use: No     MEDICATIONS    Home Medication:    Current Medication:  Current Facility-Administered Medications:    acetaminophen  (TYLENOL) tablet 650 mg, 650 mg, Oral, Q6H PRN **OR** acetaminophen (TYLENOL) suppository 650 mg, 650 mg, Rectal, Q6H PRN, Mansy, Arvella Merles, MD   ALPRAZolam Duanne Moron) tablet 0.25 mg, 0.25 mg, Oral, TID PRN, Sharion Settler, NP   amLODipine (NORVASC) tablet 5 mg, 5 mg, Oral, Daily, Ghimire, Kuber, MD, 5 mg at 03/21/21 1455   benzonatate (TESSALON) capsule 200 mg, 200 mg, Oral, TID PRN, Mansy, Jan A, MD   budesonide (PULMICORT) nebulizer solution 0.25 mg, 0.25 mg, Nebulization, BID, Sharion Settler, NP, 0.25 mg at 03/22/21 3419   chlorpheniramine-HYDROcodone (TUSSIONEX) 10-8 MG/5ML suspension 5 mL, 5 mL, Oral, Q12H PRN, Mansy, Jan A, MD   cholecalciferol (VITAMIN D3) tablet 2,000 Units, 2,000 Units, Oral, Daily, Mansy, Jan A, MD, 2,000 Units at 03/21/21 0942   [START ON 03/31/2021] cyanocobalamin ((VITAMIN B-12)) injection 1,000 mcg, 1,000 mcg, Intramuscular, Q30 days, Mansy, Jan A, MD   cyclobenzaprine (FLEXERIL) tablet 5 mg, 5 mg, Oral, TID PRN, Mansy, Jan A, MD   enoxaparin (LOVENOX) injection 40 mg, 40 mg, Subcutaneous, Daily, Mansy, Jan A, MD, 40 mg at 03/21/21 0941   fluticasone (FLONASE) 50 MCG/ACT nasal spray 2 spray, 2 spray, Each Nare, Daily PRN, Mansy, Jan A, MD   guaiFENesin (MUCINEX) 12 hr tablet 600 mg, 600 mg, Oral, BID, Mansy, Jan A, MD, 600 mg at 03/21/21 2201   hydrALAZINE (APRESOLINE) tablet 25 mg, 25 mg, Oral, Q6H PRN, Barb Merino, MD   HYDROcodone-acetaminophen (NORCO/VICODIN) 5-325 MG per tablet 1 tablet, 1 tablet, Oral, Q4H PRN, Mansy, Jan A, MD   Ipratropium-Albuterol (COMBIVENT) respimat 2 puff, 2 puff, Inhalation, Q4H PRN, Mansy, Jan A, MD   ipratropium-albuterol (DUONEB) 0.5-2.5 (3) MG/3ML nebulizer solution 3 mL, 3 mL, Nebulization, TID, Ghimire, Kuber, MD, 3 mL at 03/22/21 0734   magnesium hydroxide (MILK OF MAGNESIA) suspension 30 mL, 30 mL, Oral, Daily PRN, Mansy, Jan A, MD   methylPREDNISolone sodium succinate (SOLU-MEDROL) 40 mg/mL injection 40 mg, 40 mg, Intravenous,  Q12H, Mansy, Jan A, MD, 40 mg at 03/22/21 0058   multivitamin with minerals tablet 1 tablet, 1 tablet, Oral, Daily, Mansy, Jan A, MD, 1 tablet at 03/21/21 0941   ondansetron (ZOFRAN) tablet 4 mg, 4 mg, Oral, Q6H PRN **OR** ondansetron (ZOFRAN) injection 4 mg, 4 mg, Intravenous, Q6H PRN, Mansy, Jan A, MD   oseltamivir (TAMIFLU) capsule 30 mg, 30 mg, Oral, BID, Mansy, Jan A, MD, 30 mg at 03/21/21 2201   pantoprazole (PROTONIX) EC tablet 40 mg, 40 mg, Oral, Daily, Mansy, Jan A, MD, 40 mg at 03/21/21 0941   sodium chloride (OCEAN) 0.65 % nasal spray 1 spray, 1 spray, Nasal, PRN, Mansy, Jan A, MD   traZODone (DESYREL) tablet 25 mg, 25 mg, Oral, QHS PRN, Mansy, Jan A, MD    ALLERGIES   Methotrexate derivatives, Other, Ranitidine, Sulfasalazine, and Topiramate     REVIEW OF SYSTEMS  Review of Systems:  Gen:  Denies  fever, sweats, chills weigh loss  HEENT: Denies blurred vision, double vision, ear pain, eye pain, hearing loss, nose bleeds, sore throat Cardiac:  No dizziness, chest pain or heaviness, chest tightness,edema Resp:   Denies cough or sputum porduction, shortness of breath,wheezing, hemoptysis,  Gi: Denies swallowing difficulty, stomach pain, nausea or vomiting, diarrhea, constipation, bowel incontinence Gu:  Denies bladder incontinence, burning urine Ext:   Denies Joint pain, stiffness or swelling Skin: Denies  skin rash, easy bruising or bleeding or hives Endoc:  Denies polyuria, polydipsia , polyphagia or weight change Psych:   Denies depression, insomnia or hallucinations   Other:  All other systems negative   VS: BP (!) 145/79 (BP Location: Left Arm)    Pulse (!) 105    Temp 98 F (36.7 C)    Resp 18    Ht 5\' 2"  (1.575 m)    Wt 63 kg    SpO2 97%    BMI 25.42 kg/m      PHYSICAL EXAM    GENERAL:NAD, no fevers, chills, no weakness no fatigue HEAD: Normocephalic, atraumatic.  EYES: Pupils equal, round, reactive to light. Extraocular muscles intact. No scleral  icterus.  MOUTH: Moist mucosal membrane. Dentition intact. No abscess noted.  EAR, NOSE, THROAT: Clear without exudates. No external lesions.  NECK: Supple. No thyromegaly. No nodules. No JVD.  PULMONARY: Diffuse coarse rhonchi right sided +wheezes CARDIOVASCULAR: S1 and S2. Regular rate and rhythm. No murmurs, rubs, or gallops. No edema. Pedal pulses 2+ bilaterally.  GASTROINTESTINAL: Soft, nontender, nondistended. No masses. Positive bowel sounds. No hepatosplenomegaly.  MUSCULOSKELETAL: No swelling, clubbing, or edema. Range of motion full in all extremities.  NEUROLOGIC: Cranial nerves II through XII are intact. No gross focal neurological deficits. Sensation intact. Reflexes intact.  SKIN: No ulceration, lesions, rashes, or cyanosis. Skin warm and dry. Turgor intact.  PSYCHIATRIC: Mood, affect within normal limits. The patient is awake, alert and oriented x 3. Insight, judgment intact.       IMAGING    DG Chest 2 View  Result Date: 03/17/2021 CLINICAL DATA:  Shortness of breath. EXAM: CHEST - 2 VIEW COMPARISON:  March 11, 2021. FINDINGS: The heart size and mediastinal contours are within normal limits. Mild bibasilar subsegmental atelectasis is noted. Small pleural effusions may be present. Status post kyphoplasty at multiple levels of the thoracic spine. Multiple probable old compression fractures are noted as well. IMPRESSION: Mild bibasilar subsegmental atelectasis with small bilateral pleural effusions. Electronically Signed   By: Marijo Conception M.D.   On: 03/17/2021 15:36   DG Chest 2 View  Result Date: 03/11/2021 CLINICAL DATA:  Shortness of breath, cough EXAM: CHEST - 2 VIEW COMPARISON:  09/15/2020 FINDINGS: Cardiac size is within normal limits. Thoracic aorta is tortuous and ectatic. Increase in AP diameter of chest suggests COPD. There are no signs of alveolar pulmonary edema or focal pulmonary consolidation. There is improvement in aeration of lower lung fields. There is no  pleural effusion or pneumothorax. Osteopenia is seen in bony structures. There is decrease in height of multiple thoracic vertebral bodies. There is previous vertebroplasty in multiple thoracic vertebral bodies. IMPRESSION: COPD. There are no signs of pulmonary edema or focal pulmonary consolidation. Electronically Signed   By: Elmer Picker M.D.   On: 03/11/2021 09:43   CT Angio Chest PE W and/or Wo Contrast  Result Date: 03/18/2021 CLINICAL DATA:  Low oxygen saturation and elevated D-dimer. EXAM: CT ANGIOGRAPHY CHEST WITH CONTRAST TECHNIQUE:  Multidetector CT imaging of the chest was performed using the standard protocol during bolus administration of intravenous contrast. Multiplanar CT image reconstructions and MIPs were obtained to evaluate the vascular anatomy. CONTRAST:  60mL OMNIPAQUE IOHEXOL 350 MG/ML SOLN COMPARISON:  June 16, 2019 FINDINGS: Cardiovascular: There is marked severity calcification of the thoracic aorta, without evidence of aortic aneurysm or dissection. Satisfactory opacification of the pulmonary arteries to the segmental level. No evidence of pulmonary embolism. Normal heart size. No pericardial effusion. Mediastinum/Nodes: No enlarged mediastinal, hilar, or axillary lymph nodes. Thyroid gland, trachea, and esophagus demonstrate no significant findings. Lungs/Pleura: There is mild central lobular emphysema. Mild atelectatic changes are seen within the posterior aspects of the bilateral lung bases, right greater than left. There is no evidence of a pleural effusion or pneumothorax. Upper Abdomen: A 1.8 cm x 1.1 cm focus of parenchymal low attenuation is seen within the posterior aspect of the left lobe of the liver. Musculoskeletal: Compression fracture deformities of indeterminate age are seen involving the T6, T7, T8 and T9 vertebral bodies. This is most severe at the level of T7, which represents a new finding when compared to the prior study. Evidence of prior vertebroplasty is  noted at the levels of T5, T11, T12, L1 and L2. Review of the MIP images confirms the above findings. IMPRESSION: 1. No evidence of pulmonary embolus. 2. Mild central lobular emphysema. 3. Compression fracture deformities of indeterminate age involving the T6, T7, T8 and T9 vertebral bodies, most severe at the level of T7. 4. Evidence of prior vertebroplasty at the levels of T5, T11, T12, L1 and L2. Aortic Atherosclerosis (ICD10-I70.0) and Emphysema (ICD10-J43.9). Electronically Signed   By: Virgina Norfolk M.D.   On: 03/18/2021 04:00   US Venous Img Lower Bilateral (DVT)  Result Date: 03/19/2021 CLINICAL DATA:  Shortness of breath, leg pain EXAM: BILATERAL LOWER EXTREMITY VENOUS DOPPLER ULTRASOUND TECHNIQUE: Gray-scale sonography with compression, as well as color and duplex ultrasound, were performed to evaluate the deep venous system(s) from the level of the common femoral vein through the popliteal and proximal calf veins. COMPARISON:  None. FINDINGS: VENOUS Normal compressibility of BILATERAL common femoral, superficial femoral, and popliteal veins, as well as the visualized calf veins. Visualized portions of BILATERAL profunda femoral vein and great saphenous vein unremarkable. No filling defects to suggest DVT on grayscale or color Doppler imaging. Doppler waveforms show normal direction of venous flow, normal respiratory plasticity and response to augmentation. OTHER None. Limitations: none IMPRESSION: No evidence of deep venous thrombosis in either lower extremity. Electronically Signed   By: Lavonia Dana M.D.   On: 03/19/2021 09:59   DG Chest Port 1 View  Result Date: 03/21/2021 CLINICAL DATA:  Shortness of breath EXAM: PORTABLE CHEST 1 VIEW COMPARISON:  Previous studies including the examination of 03/17/2021 FINDINGS: Cardiac size is unremarkable. Thoracic aorta is tortuous and ectatic. There are no signs of pulmonary edema. There is crowding of markings in the lower lung fields with interval  improvement. No new focal infiltrates are seen. There is no pleural effusion or pneumothorax. There is evidence of vertebroplasty in the multiple thoracic vertebral bodies IMPRESSION: There is interval improvement in aeration of lower lung fields suggesting resolving subsegmental atelectasis. There are no new infiltrates or signs of pulmonary edema. Electronically Signed   By: Elmer Picker M.D.   On: 03/21/2021 12:06   ECHOCARDIOGRAM COMPLETE  Result Date: 03/20/2021    ECHOCARDIOGRAM REPORT   Patient Name:   Delbert Phenix Date of Exam:  03/20/2021 Medical Rec #:  [id number]     Height:       62.0 in Accession #:    [id number]    Weight:       139.0 lb Date of Birth:  [birth year]     BSA:          1.638 m Patient Age:    60 years      BP:           128/78 mmHg Patient Gender: F             HR:           112 bpm. Exam Location:  [facility] Procedure: 2D Echo, Color Doppler and Cardiac Doppler Indications:     R06.00 Dyspnea  History:         Patient has no prior history of Echocardiogram examinations.                  COPD, Signs/Symptoms:Shortness of Breath; Risk                  Factors:Hypertension. Positive for influenza type A.  Sonographer:     Charmayne Sheer Referring Phys:  [id number] Barb Merino Diagnosing Phys: Isaias Cowman MD  Sonographer Comments: Suboptimal parasternal window and suboptimal subcostal window. Image acquisition challenging due to COPD. IMPRESSIONS  1. Left ventricular ejection fraction, by estimation, is 55 to 60%. The left ventricle has normal function. The left ventricle has no regional wall motion abnormalities. Left ventricular diastolic parameters are consistent with Grade I diastolic dysfunction (impaired relaxation).  2. Right ventricular systolic function is normal. The right ventricular size is normal.  3. The mitral valve is normal in structure. Mild mitral valve regurgitation. No evidence of mitral stenosis.  4. The aortic valve is normal in structure. Aortic valve  regurgitation is not visualized. No aortic stenosis is present.  5. The inferior vena cava is normal in size with greater than 50% respiratory variability, suggesting right atrial pressure of 3 mmHg. FINDINGS  Left Ventricle: Left ventricular ejection fraction, by estimation, is 55 to 60%. The left ventricle has normal function. The left ventricle has no regional wall motion abnormalities. The left ventricular internal cavity size was normal in size. There is  no left ventricular hypertrophy. Left ventricular diastolic parameters are consistent with Grade I diastolic dysfunction (impaired relaxation). Right Ventricle: The right ventricular size is normal. No increase in right ventricular wall thickness. Right ventricular systolic function is normal. Left Atrium: Left atrial size was normal in size. Right Atrium: Right atrial size was normal in size. Pericardium: There is no evidence of pericardial effusion. Mitral Valve: The mitral valve is normal in structure. Mild mitral valve regurgitation. No evidence of mitral valve stenosis. MV peak gradient, 5.3 mmHg. The mean mitral valve gradient is 3.0 mmHg. Tricuspid Valve: The tricuspid valve is normal in structure. Tricuspid valve regurgitation is mild . No evidence of tricuspid stenosis. Aortic Valve: The aortic valve is normal in structure. Aortic valve regurgitation is not visualized. No aortic stenosis is present. Aortic valve mean gradient measures 4.0 mmHg. Aortic valve peak gradient measures 7.2 mmHg. Aortic valve area, by VTI measures 3.56 cm. Pulmonic Valve: The pulmonic valve was normal in structure. Pulmonic valve regurgitation is not visualized. No evidence of pulmonic stenosis. Aorta: The aortic root is normal in size and structure. Venous: The inferior vena cava is normal in size with greater than 50% respiratory variability, suggesting right atrial pressure of 3 mmHg. IAS/Shunts: No atrial  level shunt detected by color flow Doppler.  LEFT VENTRICLE PLAX  2D LVOT diam:     2.20 cm     Diastology LV SV:         84          LV e' medial:    6.42 cm/s LV SV Index:   52          LV E/e' medial:  16.5 LVOT Area:     3.80 cm    LV e' lateral:   7.07 cm/s                            LV E/e' lateral: 15.0  LV Volumes (MOD) LV vol d, MOD A2C: 48.6 ml LV vol d, MOD A4C: 62.2 ml LV vol s, MOD A2C: 24.0 ml LV vol s, MOD A4C: 32.5 ml LV SV MOD A2C:     24.6 ml LV SV MOD A4C:     62.2 ml LV SV MOD BP:      28.9 ml RIGHT VENTRICLE RV Basal diam:  3.16 cm LEFT ATRIUM             Index        RIGHT ATRIUM           Index LA Vol (A2C):   26.6 ml 16.24 ml/m  RA Area:     10.30 cm LA Vol (A4C):   23.0 ml 14.04 ml/m  RA Volume:   21.80 ml  13.31 ml/m LA Biplane Vol: 26.3 ml 16.06 ml/m  AORTIC VALVE                    PULMONIC VALVE AV Area (Vmax):    3.49 cm     PV Vmax:       1.20 m/s AV Area (Vmean):   3.31 cm     PV Vmean:      83.500 cm/s AV Area (VTI):     3.56 cm     PV VTI:        0.205 m AV Vmax:           134.00 cm/s  PV Peak grad:  5.8 mmHg AV Vmean:          98.100 cm/s  PV Mean grad:  3.0 mmHg AV VTI:            0.237 m AV Peak Grad:      7.2 mmHg AV Mean Grad:      4.0 mmHg LVOT Vmax:         123.00 cm/s LVOT Vmean:        85.500 cm/s LVOT VTI:          0.222 m LVOT/AV VTI ratio: 0.94  AORTA Ao Root diam: 2.80 cm MITRAL VALVE MV Area (PHT): 7.09 cm     SHUNTS MV Area VTI:   5.62 cm     Systemic VTI:  0.22 m MV Peak grad:  5.3 mmHg     Systemic Diam: 2.20 cm MV Mean grad:  3.0 mmHg MV Vmax:       1.15 m/s MV Vmean:      77.5 cm/s MV Decel Time: 107 msec MV E velocity: 106.00 cm/s MV A velocity: 113.00 cm/s MV E/A ratio:  0.94 Isaias Cowman MD Electronically signed by Isaias Cowman MD Signature Date/Time: 03/20/2021/1:43:48 PM    Final       ASSESSMENT/PLAN   Acute  hypoxemic respiratory failure   Due to concomitant influenza A infection with COPD exacerbation and atelectasis with mild effusion of right pleural space.      - patient would benefit  from pulmonary optimization    Influenza A infetion   - Tamiflu   - stopped Tessalon to allow expectoration    - Dcd Tussinex hinders respiratory effort   - dcd pulmicort - patient on IV steroids   Acute on Chronic COPD    - continue Duoneb    - patient has allergy to Trelegy and Anoro    - continue steriods - reduced solumedrol to 40 once daily IV     - IS and flutter are at bedside   Bibasilar atelectasis    Patient would benefit from aggressive recruitment maneuvers -PT/OT should be daily    Thank you for allowing me to participate in the care of this patient.  Total face to face encounter time for this patient visit was >45 min. >50% of the time was  spent in counseling and coordination of care.   Patient/Family are satisfied with care plan and all questions have been answered.  This document was prepared using Dragon voice recognition software and may include unintentional dictation errors.     Ottie Glazier, M.D.  Division of [place]

## 2021-03-23 MED ORDER — FUROSEMIDE 10 MG/ML IJ SOLN
20.0000 mg | Freq: Two times a day (BID) | INTRAMUSCULAR | Status: DC
  Administered 2021-03-23 – 2021-03-28 (×11): 20 mg via INTRAVENOUS
  Filled 2021-03-23 (×12): qty 4

## 2021-03-23 MED ORDER — METHYLPREDNISOLONE SODIUM SUCC 40 MG IJ SOLR: 20.0000 mg | INTRAMUSCULAR | Status: DC

## 2021-03-23 NOTE — Progress Notes (Signed)
°   03/23/21 0824  Assess: MEWS Score  Temp 98.2 F (36.8 C)  BP 126/74  Pulse Rate (!) 113  Resp 20  SpO2 90 %  Assess: MEWS Score  MEWS Temp 0  MEWS Systolic 0  MEWS Pulse 2  MEWS RR 0  MEWS LOC 0  MEWS Score 2  MEWS Score Color Yellow  Assess: if the MEWS score is Yellow or Red  Were vital signs taken at a resting state? Yes  Focused Assessment No change from prior assessment  Does the patient meet 2 or more of the SIRS criteria? No  Does the patient have a confirmed or suspected source of infection? No  Provider and Rapid Response Notified? No  MEWS guidelines implemented *See Row Information* No, vital signs rechecked  Treat  MEWS Interventions Consulted Respiratory Therapy  Pain Scale 0-10  Pain Score 0  Complains of Shortness of breath (none)  Interventions Other (comment) (increase o2)  Patients response to intervention Effective  Take Vital Signs  Increase Vital Sign Frequency  Yellow: Q 2hr X 2 then Q 4hr X 2, if remains yellow, continue Q 4hrs  Escalate  MEWS: Escalate Yellow: discuss with charge nurse/RN and consider discussing with provider and RRT  Notify: Charge Nurse/RN  Name of Charge Nurse/RN Notified susan  Date Charge Nurse/RN Notified 03/23/21  Time Charge Nurse/RN Notified [id number]  Notify: Provider  Provider Name/Title Barb Merino MD  Date Provider Notified 03/23/21  Time Provider Notified [phone]  Notification Type Page  Notification Reason Other (Comment) (MEWS score increase)  Provider response En route  Date of Provider Response 03/23/21  Time of Provider Response [phone]  Document  Patient Outcome Stabilized after interventions  Progress note created (see row info) Yes  Assess: SIRS CRITERIA  SIRS Temperature  0  SIRS Pulse 1  SIRS Respirations  0  SIRS WBC 0  SIRS Score Sum  1     Patient vitals Rechecked. O2 level 94 after increasing oxygen to 6 liters from 5 liters previously. HR down to 110. Patient reports no change in how she is  feeling. No anxiety at this time and no need for any cough medication. MEWS back to green at this time.

## 2021-03-23 NOTE — Progress Notes (Signed)
PULMONOLOGY         Date: 03/23/2021,   MRN# [id number] Ashley Mack 1945-05-16     AdmissionWeight: 63.5 kg                 CurrentWeight: 80 kg   Referring physician: Dr Sloan Leiter   CHIEF COMPLAINT:   Increased O2 requirement   HISTORY OF PRESENT ILLNESS   This is a 76 yo F with hx of anemia, anxiety, OA, RA, retinopathy, NASH, CKD, chronic migraines, osteoporosis, emphysema who came in with worsening SOB/DOE.  Seh was seen at urgent care and post treatment continued to deteriorate.  Influenza A came back positive and influenza antigen was negative.  COVID-19 PCR came back negative.  D-dimer was significantly elevated at 6.44.After 5d treatment patient has not reached improvement substantially and requires incresead O2 with PCCM consultation for further evaluation and management.    03/23/21- patient with increased O2 req, I have reduced steroids and increased diuretic. Continue current regimen.  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[place])    Iron (Fe) deficiency anemia    Migraine headache    in past   Migraines    Motion sickness    No natural teeth    Osteoporosis    Rheumatoid arthritis ([place])    Vertigo      SURGICAL HISTORY   Past Surgical History:  Procedure Laterality Date   CATARACT EXTRACTION W/PHACO Right 08/12/2016   Procedure: CATARACT EXTRACTION PHACO AND INTRAOCULAR LENS PLACEMENT ([place])  Right;  Surgeon: Leandrew Koyanagi, MD;  Location: [facility];  Service: Ophthalmology;  Laterality: Right;   CATARACT EXTRACTION W/PHACO Left 09/30/2016   Procedure: CATARACT EXTRACTION PHACO AND  INTRAOCULAR LENS PLACEMENT ([place]) Left;  Surgeon: Leandrew Koyanagi, MD;  Location: [facility];  Service: Ophthalmology;  Laterality: Left;   COLONOSCOPY     COLONOSCOPY WITH PROPOFOL N/A 12/09/2016   Procedure: COLONOSCOPY WITH PROPOFOL;  Surgeon: Toledo, Benay Pike, MD;  Location: [facility];  Service: Endoscopy;  Laterality: N/A;   ESOPHAGOGASTRODUODENOSCOPY     ESOPHAGOGASTRODUODENOSCOPY (EGD) WITH PROPOFOL N/A 12/09/2016   Procedure: ESOPHAGOGASTRODUODENOSCOPY (EGD) WITH PROPOFOL;  Surgeon: Toledo, Benay Pike, MD;  Location: [facility];  Service: Endoscopy;  Laterality: N/A;   KYPHOPLASTY N/A 08/10/2017   Procedure: Hewitt Shorts;  Surgeon: Hessie Knows, MD;  Location: [facility];  Service: Orthopedics;  Laterality: N/A;   KYPHOPLASTY N/A 09/09/2017   Procedure: [id number],M8;  Surgeon: Hessie Knows, MD;  Location: [facility];  Service: Orthopedics;  Laterality: N/A;   KYPHOPLASTY N/A 09/28/2017   Procedure: [id number];  Surgeon: Hessie Knows, MD;  Location: [facility];  Service: Orthopedics;  Laterality: N/A;   KYPHOPLASTY N/A 06/19/2019   Procedure: KYPHOPLASTY T5;  Surgeon: Hessie Knows, MD;  Location: [facility];  Service: Orthopedics;  Laterality: N/A;   OOPHORECTOMY Bilateral 2003   RIGHT/LEFT HEART CATH AND CORONARY ANGIOGRAPHY Bilateral 10/19/2019   Procedure: RIGHT/LEFT HEART CATH AND CORONARY ANGIOGRAPHY;  Surgeon: Yolonda Kida, MD;  Location: [facility];  Service: Cardiovascular;  Laterality: Bilateral;     FAMILY HISTORY   Family History  Problem Relation Age of Onset   Kidney disease Mother    Diabetes Mellitus II Brother      SOCIAL HISTORY   Social History   Tobacco Use   Smoking status: Former    Packs/day: 1.00    Types: Cigarettes    Quit date: 2009    Years since quitting: 14.0   Smokeless tobacco: Never  Vaping Use   Vaping Use: Never used  Substance Use Topics   Alcohol use: No   Drug use: No     MEDICATIONS     Home Medication:    Current Medication:  Current Facility-Administered Medications:    acetaminophen (TYLENOL) tablet 650 mg, 650 mg, Oral, Q6H PRN **OR** acetaminophen (TYLENOL) suppository 650 mg, 650 mg, Rectal, Q6H PRN, Mansy, Jan A, MD   ALPRAZolam Duanne Moron) tablet 0.25 mg, 0.25 mg, Oral, TID PRN, Sharion Settler, NP, 0.25 mg at 03/22/21 2224   cholecalciferol (VITAMIN D3) tablet 2,000 Units, 2,000 Units, Oral, Daily, Mansy, Jan A, MD, 2,000 Units at 03/23/21 0909   [START ON 03/31/2021] cyanocobalamin ((VITAMIN B-12)) injection 1,000 mcg, 1,000 mcg, Intramuscular, Q30 days, Mansy, Jan A, MD   cyclobenzaprine (FLEXERIL) tablet 5 mg, 5 mg, Oral, TID PRN, Mansy, Jan A, MD, 5 mg at 03/23/21 1328   enoxaparin (LOVENOX) injection 40 mg, 40 mg, Subcutaneous, Daily, Mansy, Jan A, MD, 40 mg at 03/23/21 0907   fluticasone (FLONASE) 50 MCG/ACT nasal spray 2 spray, 2 spray, Each Nare, Daily PRN, Mansy, Jan A, MD   guaiFENesin (MUCINEX) 12 hr tablet 600 mg, 600 mg, Oral, BID, Mansy, Jan A, MD, 600 mg at 03/23/21 1610   hydrALAZINE (APRESOLINE) tablet 25 mg, 25 mg, Oral, Q6H PRN, Barb Merino, MD   HYDROcodone-acetaminophen (NORCO/VICODIN) 5-325 MG per tablet 1 tablet, 1 tablet, Oral, Q4H PRN, Mansy, Jan A, MD   Ipratropium-Albuterol (COMBIVENT) respimat 2 puff, 2 puff, Inhalation, Q4H PRN, Mansy, Jan A, MD   ipratropium-albuterol (DUONEB) 0.5-2.5 (3) MG/3ML nebulizer solution 3 mL, 3 mL, Nebulization, TID, Barb Merino, MD, 3 mL at 03/23/21 0757   magnesium hydroxide (MILK OF MAGNESIA) suspension 30 mL, 30 mL, Oral, Daily PRN, Mansy, Jan A, MD   methylPREDNISolone sodium succinate (SOLU-MEDROL) 40 mg/mL injection 40 mg, 40 mg, Intravenous, Q24H, Shebra Muldrow, MD, 40 mg at 03/23/21 1322   multivitamin with minerals tablet 1 tablet, 1 tablet, Oral, Daily, Mansy, Jan A, MD, 1 tablet at 03/23/21 1211   ondansetron (ZOFRAN) tablet 4 mg, 4 mg, Oral, Q6H PRN **OR** ondansetron (ZOFRAN) injection 4  mg, 4 mg, Intravenous, Q6H PRN, Mansy, Jan A, MD   pantoprazole (PROTONIX) EC tablet 40 mg, 40 mg, Oral, Daily, Mansy, Jan A, MD, 40 mg at 03/23/21 9604   sodium chloride (OCEAN) 0.65 % nasal spray 1 spray, 1 spray, Nasal, PRN, Mansy, Jan A, MD   traZODone (DESYREL) tablet 25 mg, 25 mg, Oral, QHS PRN, Mansy, Jan A, MD, 25 mg at 03/22/21 2224    ALLERGIES   Methotrexate derivatives, Other, Ranitidine, Sulfasalazine, and Topiramate     REVIEW OF SYSTEMS    Review of Systems:  Gen:  Denies  fever, sweats, chills weigh loss  HEENT: Denies blurred vision, double vision, ear pain, eye pain, hearing loss, nose bleeds, sore throat Cardiac:  No dizziness, chest pain or heaviness, chest tightness,edema Resp:   Denies cough or sputum porduction, shortness of breath,wheezing, hemoptysis,  Gi: Denies swallowing difficulty, stomach pain,  nausea or vomiting, diarrhea, constipation, bowel incontinence Gu:  Denies bladder incontinence, burning urine Ext:   Denies Joint pain, stiffness or swelling Skin: Denies  skin rash, easy bruising or bleeding or hives Endoc:  Denies polyuria, polydipsia , polyphagia or weight change Psych:   Denies depression, insomnia or hallucinations   Other:  All other systems negative   VS: BP 126/74 (BP Location: Left Arm)    Pulse (!) 110    Temp 98.2 F (36.8 C) (Oral)    Resp 19    Ht 5\' 2"  (1.575 m)    Wt 63 kg    SpO2 94%    BMI 25.42 kg/m      PHYSICAL EXAM    GENERAL:NAD, no fevers, chills, no weakness no fatigue HEAD: Normocephalic, atraumatic.  EYES: Pupils equal, round, reactive to light. Extraocular muscles intact. No scleral icterus.  MOUTH: Moist mucosal membrane. Dentition intact. No abscess noted.  EAR, NOSE, THROAT: Clear without exudates. No external lesions.  NECK: Supple. No thyromegaly. No nodules. No JVD.  PULMONARY: Diffuse coarse rhonchi right sided +wheezes CARDIOVASCULAR: S1 and S2. Regular rate and rhythm. No murmurs, rubs, or  gallops. No edema. Pedal pulses 2+ bilaterally.  GASTROINTESTINAL: Soft, nontender, nondistended. No masses. Positive bowel sounds. No hepatosplenomegaly.  MUSCULOSKELETAL: No swelling, clubbing, or edema. Range of motion full in all extremities.  NEUROLOGIC: Cranial nerves II through XII are intact. No gross focal neurological deficits. Sensation intact. Reflexes intact.  SKIN: No ulceration, lesions, rashes, or cyanosis. Skin warm and dry. Turgor intact.  PSYCHIATRIC: Mood, affect within normal limits. The patient is awake, alert and oriented x 3. Insight, judgment intact.       IMAGING    DG Chest 2 View  Result Date: 03/17/2021 CLINICAL DATA:  Shortness of breath. EXAM: CHEST - 2 VIEW COMPARISON:  March 11, 2021. FINDINGS: The heart size and mediastinal contours are within normal limits. Mild bibasilar subsegmental atelectasis is noted. Small pleural effusions may be present. Status post kyphoplasty at multiple levels of the thoracic spine. Multiple probable old compression fractures are noted as well. IMPRESSION: Mild bibasilar subsegmental atelectasis with small bilateral pleural effusions. Electronically Signed   By: Marijo Conception M.D.   On: 03/17/2021 15:36   DG Chest 2 View  Result Date: 03/11/2021 CLINICAL DATA:  Shortness of breath, cough EXAM: CHEST - 2 VIEW COMPARISON:  09/15/2020 FINDINGS: Cardiac size is within normal limits. Thoracic aorta is tortuous and ectatic. Increase in AP diameter of chest suggests COPD. There are no signs of alveolar pulmonary edema or focal pulmonary consolidation. There is improvement in aeration of lower lung fields. There is no pleural effusion or pneumothorax. Osteopenia is seen in bony structures. There is decrease in height of multiple thoracic vertebral bodies. There is previous vertebroplasty in multiple thoracic vertebral bodies. IMPRESSION: COPD. There are no signs of pulmonary edema or focal pulmonary consolidation. Electronically Signed    By: Elmer Picker M.D.   On: 03/11/2021 09:43   CT Angio Chest PE W and/or Wo Contrast  Result Date: 03/18/2021 CLINICAL DATA:  Low oxygen saturation and elevated D-dimer. EXAM: CT ANGIOGRAPHY CHEST WITH CONTRAST TECHNIQUE: Multidetector CT imaging of the chest was performed using the standard protocol during bolus administration of intravenous contrast. Multiplanar CT image reconstructions and MIPs were obtained to evaluate the vascular anatomy. CONTRAST:  21mL OMNIPAQUE IOHEXOL 350 MG/ML SOLN COMPARISON:  June 16, 2019 FINDINGS: Cardiovascular: There is marked severity calcification of the thoracic aorta, without evidence of aortic  aneurysm or dissection. Satisfactory opacification of the pulmonary arteries to the segmental level. No evidence of pulmonary embolism. Normal heart size. No pericardial effusion. Mediastinum/Nodes: No enlarged mediastinal, hilar, or axillary lymph nodes. Thyroid gland, trachea, and esophagus demonstrate no significant findings. Lungs/Pleura: There is mild central lobular emphysema. Mild atelectatic changes are seen within the posterior aspects of the bilateral lung bases, right greater than left. There is no evidence of a pleural effusion or pneumothorax. Upper Abdomen: A 1.8 cm x 1.1 cm focus of parenchymal low attenuation is seen within the posterior aspect of the left lobe of the liver. Musculoskeletal: Compression fracture deformities of indeterminate age are seen involving the T6, T7, T8 and T9 vertebral bodies. This is most severe at the level of T7, which represents a new finding when compared to the prior study. Evidence of prior vertebroplasty is noted at the levels of T5, T11, T12, L1 and L2. Review of the MIP images confirms the above findings. IMPRESSION: 1. No evidence of pulmonary embolus. 2. Mild central lobular emphysema. 3. Compression fracture deformities of indeterminate age involving the T6, T7, T8 and T9 vertebral bodies, most severe at the level of T7. 4.  Evidence of prior vertebroplasty at the levels of T5, T11, T12, L1 and L2. Aortic Atherosclerosis (ICD10-I70.0) and Emphysema (ICD10-J43.9). Electronically Signed   By: Virgina Norfolk M.D.   On: 03/18/2021 04:00   US Venous Img Lower Bilateral (DVT)  Result Date: 03/19/2021 CLINICAL DATA:  Shortness of breath, leg pain EXAM: BILATERAL LOWER EXTREMITY VENOUS DOPPLER ULTRASOUND TECHNIQUE: Gray-scale sonography with compression, as well as color and duplex ultrasound, were performed to evaluate the deep venous system(s) from the level of the common femoral vein through the popliteal and proximal calf veins. COMPARISON:  None. FINDINGS: VENOUS Normal compressibility of BILATERAL common femoral, superficial femoral, and popliteal veins, as well as the visualized calf veins. Visualized portions of BILATERAL profunda femoral vein and great saphenous vein unremarkable. No filling defects to suggest DVT on grayscale or color Doppler imaging. Doppler waveforms show normal direction of venous flow, normal respiratory plasticity and response to augmentation. OTHER None. Limitations: none IMPRESSION: No evidence of deep venous thrombosis in either lower extremity. Electronically Signed   By: Lavonia Dana M.D.   On: 03/19/2021 09:59   DG Chest Port 1 View  Result Date: 03/21/2021 CLINICAL DATA:  Shortness of breath EXAM: PORTABLE CHEST 1 VIEW COMPARISON:  Previous studies including the examination of 03/17/2021 FINDINGS: Cardiac size is unremarkable. Thoracic aorta is tortuous and ectatic. There are no signs of pulmonary edema. There is crowding of markings in the lower lung fields with interval improvement. No new focal infiltrates are seen. There is no pleural effusion or pneumothorax. There is evidence of vertebroplasty in the multiple thoracic vertebral bodies IMPRESSION: There is interval improvement in aeration of lower lung fields suggesting resolving subsegmental atelectasis. There are no new infiltrates or signs  of pulmonary edema. Electronically Signed   By: Elmer Picker M.D.   On: 03/21/2021 12:06   ECHOCARDIOGRAM COMPLETE  Result Date: 03/20/2021    ECHOCARDIOGRAM REPORT   Patient Name:   VALERI SULA Date of Exam: 03/20/2021 Medical Rec #:  [id number]     Height:       62.0 in Accession #:    [id number]    Weight:       139.0 lb Date of Birth:  [birth year]     BSA:          1.638 m Patient  Age:    75 years      BP:           128/78 mmHg Patient Gender: F             HR:           112 bpm. Exam Location:  [facility] Procedure: 2D Echo, Color Doppler and Cardiac Doppler Indications:     R06.00 Dyspnea  History:         Patient has no prior history of Echocardiogram examinations.                  COPD, Signs/Symptoms:Shortness of Breath; Risk                  Factors:Hypertension. Positive for influenza type A.  Sonographer:     Charmayne Sheer Referring Phys:  [id number] Barb Merino Diagnosing Phys: Isaias Cowman MD  Sonographer Comments: Suboptimal parasternal window and suboptimal subcostal window. Image acquisition challenging due to COPD. IMPRESSIONS  1. Left ventricular ejection fraction, by estimation, is 55 to 60%. The left ventricle has normal function. The left ventricle has no regional wall motion abnormalities. Left ventricular diastolic parameters are consistent with Grade I diastolic dysfunction (impaired relaxation).  2. Right ventricular systolic function is normal. The right ventricular size is normal.  3. The mitral valve is normal in structure. Mild mitral valve regurgitation. No evidence of mitral stenosis.  4. The aortic valve is normal in structure. Aortic valve regurgitation is not visualized. No aortic stenosis is present.  5. The inferior vena cava is normal in size with greater than 50% respiratory variability, suggesting right atrial pressure of 3 mmHg. FINDINGS  Left Ventricle: Left ventricular ejection fraction, by estimation, is 55 to 60%. The left ventricle has normal function. The left  ventricle has no regional wall motion abnormalities. The left ventricular internal cavity size was normal in size. There is  no left ventricular hypertrophy. Left ventricular diastolic parameters are consistent with Grade I diastolic dysfunction (impaired relaxation). Right Ventricle: The right ventricular size is normal. No increase in right ventricular wall thickness. Right ventricular systolic function is normal. Left Atrium: Left atrial size was normal in size. Right Atrium: Right atrial size was normal in size. Pericardium: There is no evidence of pericardial effusion. Mitral Valve: The mitral valve is normal in structure. Mild mitral valve regurgitation. No evidence of mitral valve stenosis. MV peak gradient, 5.3 mmHg. The mean mitral valve gradient is 3.0 mmHg. Tricuspid Valve: The tricuspid valve is normal in structure. Tricuspid valve regurgitation is mild . No evidence of tricuspid stenosis. Aortic Valve: The aortic valve is normal in structure. Aortic valve regurgitation is not visualized. No aortic stenosis is present. Aortic valve mean gradient measures 4.0 mmHg. Aortic valve peak gradient measures 7.2 mmHg. Aortic valve area, by VTI measures 3.56 cm. Pulmonic Valve: The pulmonic valve was normal in structure. Pulmonic valve regurgitation is not visualized. No evidence of pulmonic stenosis. Aorta: The aortic root is normal in size and structure. Venous: The inferior vena cava is normal in size with greater than 50% respiratory variability, suggesting right atrial pressure of 3 mmHg. IAS/Shunts: No atrial level shunt detected by color flow Doppler.  LEFT VENTRICLE PLAX 2D LVOT diam:     2.20 cm     Diastology LV SV:         84          LV e' medial:    6.42 cm/s LV SV Index:   52  LV E/e' medial:  16.5 LVOT Area:     3.80 cm    LV e' lateral:   7.07 cm/s                            LV E/e' lateral: 15.0  LV Volumes (MOD) LV vol d, MOD A2C: 48.6 ml LV vol d, MOD A4C: 62.2 ml LV vol s, MOD A2C:  24.0 ml LV vol s, MOD A4C: 32.5 ml LV SV MOD A2C:     24.6 ml LV SV MOD A4C:     62.2 ml LV SV MOD BP:      28.9 ml RIGHT VENTRICLE RV Basal diam:  3.16 cm LEFT ATRIUM             Index        RIGHT ATRIUM           Index LA Vol (A2C):   26.6 ml 16.24 ml/m  RA Area:     10.30 cm LA Vol (A4C):   23.0 ml 14.04 ml/m  RA Volume:   21.80 ml  13.31 ml/m LA Biplane Vol: 26.3 ml 16.06 ml/m  AORTIC VALVE                    PULMONIC VALVE AV Area (Vmax):    3.49 cm     PV Vmax:       1.20 m/s AV Area (Vmean):   3.31 cm     PV Vmean:      83.500 cm/s AV Area (VTI):     3.56 cm     PV VTI:        0.205 m AV Vmax:           134.00 cm/s  PV Peak grad:  5.8 mmHg AV Vmean:          98.100 cm/s  PV Mean grad:  3.0 mmHg AV VTI:            0.237 m AV Peak Grad:      7.2 mmHg AV Mean Grad:      4.0 mmHg LVOT Vmax:         123.00 cm/s LVOT Vmean:        85.500 cm/s LVOT VTI:          0.222 m LVOT/AV VTI ratio: 0.94  AORTA Ao Root diam: 2.80 cm MITRAL VALVE MV Area (PHT): 7.09 cm     SHUNTS MV Area VTI:   5.62 cm     Systemic VTI:  0.22 m MV Peak grad:  5.3 mmHg     Systemic Diam: 2.20 cm MV Mean grad:  3.0 mmHg MV Vmax:       1.15 m/s MV Vmean:      77.5 cm/s MV Decel Time: 107 msec MV E velocity: 106.00 cm/s MV A velocity: 113.00 cm/s MV E/A ratio:  0.94 Isaias Cowman MD Electronically signed by Isaias Cowman MD Signature Date/Time: 03/20/2021/1:43:48 PM    Final       ASSESSMENT/PLAN   Acute hypoxemic respiratory failure   Due to concomitant influenza A infection with COPD exacerbation and atelectasis with mild effusion of right pleural space.      - patient would benefit from pulmonary optimization    Influenza A infetion   - Tamiflu   - stopped Tessalon to allow expectoration    - Dcd Tussinex hinders respiratory effort   - dcd pulmicort -  patient on IV steroids     Mild pulmonary edema  - lasix 20 IV bid   Acute on Chronic COPD    - continue Duoneb    - patient has allergy to Trelegy and  Anoro    - continue steriods - reduced solumedrol to 40 once daily IV>>20     - IS and flutter are at bedside   Bibasilar atelectasis    Patient would benefit from aggressive recruitment maneuvers -PT/OT should be daily    Thank you for allowing me to participate in the care of this patient.  Total face to face encounter time for this patient visit was >45 min. >50% of the time was  spent in counseling and coordination of care.   Patient/Family are satisfied with care plan and all questions have been answered.  This document was prepared using Dragon voice recognition software and may include unintentional dictation errors.     Ottie Glazier, M.D.  Division of [place]

## 2021-03-23 NOTE — Progress Notes (Signed)
PROGRESS NOTE    Ashley Mack  XTK:[id number] DOB: 04-08-1945 DOA: 03/18/2021 PCP: Kirk Ruths, MD    Brief Narrative:  76 year old with hypertension, anxiety, visually impaired, GERD, migraine and COPD not on home oxygen presented with acute onset of worsening dyspnea with associated congestive productive cough and wheezing for 1 week.  Recently treated with Levaquin and prednisone taper.  In the emergency room blood pressure stable.  Heart rate 110.  95% on 2 L oxygen.  Subsequently required high flow nasal cannula oxygen.  Influenza A positive.  COVID-19 negative.  D-dimer was 6.44.  CTA with no evidence of PE.  Showed emphysema and compression fractures.   Assessment & Plan:   Principal Problem:   COPD exacerbation ([place]) Active Problems:   COPD with acute exacerbation ([place])  COPD with acute exacerbation due to acute influenza A infection: Persistent and fluctuating symptoms.  Still with bronchospasm and high oxygen requirement. Continue aggressive bronchodilator therapy, IV steroids and chest physiotherapy. Completed 5 days of IV Rocephin.  Completed Tamiflu for 5 days. CTA with no evidence of pulmonary embolism, no evidence of pneumonia. 2D echocardiogram essentially normal. Lower extremity duplex is negative for DVT. Repeat chest x-ray with improved aeration and no evidence of complications. Due to significant persistent findings, followed by pulmonary. To help coordinate and counsel given advanced COPD, will consult palliative care.  Leukocytosis: Due to high-dose steroid use.  No evidence of bacterial infection.  Will monitor.    Essential hypertension, not on treatment.  Hydralazine as needed.  Started on amlodipine that patient did not tolerate with flushing and palpitation.  Discontinued.    DVT prophylaxis: enoxaparin (LOVENOX) injection 40 mg Start: 03/18/21 1000   Code Status: Full code Family Communication: None Disposition Plan: Status is:  Inpatient  Remains inpatient appropriate because: Still on oxygen.  Significant tachypnea and tachycardia .  Consultants:  Pulmonary  Procedures:  None  Antimicrobials:  Rocephin 1/2--- 1/6 Tamiflu 1/3--1/7   Subjective:  Patient seen and examined.  On going to bedside commode, she had episode of bronchospasm and shortness of breath.  Became tachycardic and tachypneic.  Needed 6 L of oxygen. After respiratory treatment, she feels slightly better.  Denies any chest pain. SpO2: 94 % O2 Flow Rate (L/min): 6 L/min FiO2 (%): 30 %   Objective: Vitals:   03/23/21 0404 03/23/21 0800 03/23/21 0824 03/23/21 0826  BP: (!) 143/94  126/74   Pulse: (!) 109  (!) 113 (!) 110  Resp: 20  20 19   Temp: 98 F (36.7 C)  98.2 F (36.8 C)   TempSrc: Oral  Oral   SpO2: 97% 94% 90% 94%  Weight:      Height:        Intake/Output Summary (Last 24 hours) at 03/23/2021 1138 Last data filed at 03/23/2021 9924 Gross per 24 hour  Intake 200 ml  Output 0 ml  Net 200 ml    Filed Weights   03/17/21 1444 03/19/21 2148  Weight: 63.5 kg 63 kg    Examination:  General: Patient is slightly anxious after the event in the morning.  In mild to moderate distress.  Tachypneic on talking.  On 6 L oxygen. Cardiovascular: S1-S2 normal.  Regular rate rhythm.  Tachycardic. Respiratory: Bilateral poor air entry.  No added sounds. Gastrointestinal: Soft.  Nontender.  Bowel sound present. Ext: Chronic nonpitting edema.  Paresthesia and tender legs. Neuro: Alert oriented x4.  She is visually impaired.  Equal strength in all extremities. Musculoskeletal: No joint  deformities.     Data Reviewed: I have personally reviewed following labs and imaging studies  CBC: Recent Labs  Lab 03/17/21 1455 03/18/21 0630 03/20/21 0551 03/22/21 0310  WBC 10.9* 15.4* 21.9* 20.6*  NEUTROABS 9.3*  --  20.5* 19.0*  HGB 12.2 12.1 10.1* 11.5*  HCT 38.3 37.3 30.9* 35.8*  MCV 87.0 86.3 85.4 84.8  PLT 368 342 364 414*    Basic Metabolic Panel: Recent Labs  Lab 03/17/21 1455 03/18/21 0630 03/20/21 0551  NA 128* 130* 137  K 4.6 4.6 4.5  CL 91* 91* 101  CO2 27 25 29   GLUCOSE 111* 136* 155*  BUN 19 26* 27*  CREATININE 0.89 1.06* 0.85  CALCIUM 8.5* 8.4* 8.4*  MG  --   --  2.4  PHOS  --   --  3.3   GFR: Estimated Creatinine Clearance: 48.4 mL/min (by C-G formula based on SCr of 0.85 mg/dL). Liver Function Tests: Recent Labs  Lab 03/17/21 1455  AST 22  ALT 19  ALKPHOS 54  BILITOT 0.5  PROT 6.5  ALBUMIN 3.1*   No results for input(s): LIPASE, AMYLASE in the last 168 hours. No results for input(s): AMMONIA in the last 168 hours. Coagulation Profile: No results for input(s): INR, PROTIME in the last 168 hours. Cardiac Enzymes: No results for input(s): CKTOTAL, CKMB, CKMBINDEX, TROPONINI in the last 168 hours. BNP (last 3 results) No results for input(s): PROBNP in the last 8760 hours. HbA1C: No results for input(s): HGBA1C in the last 72 hours. CBG: No results for input(s): GLUCAP in the last 168 hours. Lipid Profile: No results for input(s): CHOL, HDL, LDLCALC, TRIG, CHOLHDL, LDLDIRECT in the last 72 hours. Thyroid Function Tests: No results for input(s): TSH, T4TOTAL, FREET4, T3FREE, THYROIDAB in the last 72 hours. Anemia Panel: No results for input(s): VITAMINB12, FOLATE, FERRITIN, TIBC, IRON, RETICCTPCT in the last 72 hours. Sepsis Labs: No results for input(s): PROCALCITON, LATICACIDVEN in the last 168 hours.  Recent Results (from the past 240 hour(s))  Resp Panel by RT-PCR (Flu A&B, Covid) Nasopharyngeal Swab     Status: Abnormal   Collection Time: 03/17/21  2:55 PM   Specimen: Nasopharyngeal Swab; Nasopharyngeal(NP) swabs in vial transport medium  Result Value Ref Range Status   SARS Coronavirus 2 by RT PCR NEGATIVE NEGATIVE Final    Comment: (NOTE) SARS-CoV-2 target nucleic acids are NOT DETECTED.  The SARS-CoV-2 RNA is generally detectable in upper respiratory specimens  during the acute phase of infection. The lowest concentration of SARS-CoV-2 viral copies this assay can detect is 138 copies/mL. A negative result does not preclude SARS-Cov-2 infection and should not be used as the sole basis for treatment or other patient management decisions. A negative result may occur with  improper specimen collection/handling, submission of specimen other than nasopharyngeal swab, presence of viral mutation(s) within the areas targeted by this assay, and inadequate number of viral copies(<138 copies/mL). A negative result must be combined with clinical observations, patient history, and epidemiological information. The expected result is Negative.  Fact Sheet for Patients:  EntrepreneurPulse.com.au  Fact Sheet for Healthcare Providers:  IncredibleEmployment.be  This test is no t yet approved or cleared by the [place] FDA and  has been authorized for detection and/or diagnosis of SARS-CoV-2 by FDA under an Emergency Use Authorization (EUA). This EUA will remain  in effect (meaning this test can be used) for the duration of the COVID-19 declaration under Section 564(b)(1) of the Act, 21 U.S.C.section 360bbb-3(b)(1), unless the authorization  is terminated  or revoked sooner.       Influenza A by PCR POSITIVE (A) NEGATIVE Final   Influenza B by PCR NEGATIVE NEGATIVE Final    Comment: (NOTE) The Xpert Xpress SARS-CoV-2/FLU/RSV plus assay is intended as an aid in the diagnosis of influenza from Nasopharyngeal swab specimens and should not be used as a sole basis for treatment. Nasal washings and aspirates are unacceptable for Xpert Xpress SARS-CoV-2/FLU/RSV testing.  Fact Sheet for Patients: EntrepreneurPulse.com.au  Fact Sheet for Healthcare Providers: IncredibleEmployment.be  This test is not yet approved or cleared by the [place] FDA and has been authorized for detection  and/or diagnosis of SARS-CoV-2 by FDA under an Emergency Use Authorization (EUA). This EUA will remain in effect (meaning this test can be used) for the duration of the COVID-19 declaration under Section 564(b)(1) of the Act, 21 U.S.C. section 360bbb-3(b)(1), unless the authorization is terminated or revoked.  Performed at [facility], [address] [place], [place] [address]          Radiology Studies: DG Chest [place] 1 View  Result Date: 03/21/2021 CLINICAL DATA:  Shortness of breath EXAM: PORTABLE CHEST 1 VIEW COMPARISON:  Previous studies including the examination of 03/17/2021 FINDINGS: Cardiac size is unremarkable. Thoracic aorta is tortuous and ectatic. There are no signs of pulmonary edema. There is crowding of markings in the lower lung fields with interval improvement. No new focal infiltrates are seen. There is no pleural effusion or pneumothorax. There is evidence of vertebroplasty in the multiple thoracic vertebral bodies IMPRESSION: There is interval improvement in aeration of lower lung fields suggesting resolving subsegmental atelectasis. There are no new infiltrates or signs of pulmonary edema. Electronically Signed   By: Elmer Picker M.D.   On: 03/21/2021 12:06        Scheduled Meds:  cholecalciferol  2,000 Units Oral Daily   [START ON 03/31/2021] cyanocobalamin  1,000 mcg Intramuscular Q30 days   enoxaparin (LOVENOX) injection  40 mg Subcutaneous Daily   guaiFENesin  600 mg Oral BID   ipratropium-albuterol  3 mL Nebulization TID   methylPREDNISolone (SOLU-MEDROL) injection  40 mg Intravenous Q24H   multivitamin with minerals  1 tablet Oral Daily   pantoprazole  40 mg Oral Daily   Continuous Infusions:     LOS: 5 days    Time spent: 35 minutes    Barb Merino, MD [facility] Pager [phone]

## 2021-03-24 ENCOUNTER — Inpatient Hospital Stay: Payer: Medicare Other

## 2021-03-24 LAB — CBC WITH DIFFERENTIAL/PLATELET
Abs Immature Granulocytes: 0.39 10*3/uL — ABNORMAL HIGH (ref 0.00–0.07)
Basophils Absolute: 0.1 10*3/uL (ref 0.0–0.1)
Basophils Relative: 0 %
Eosinophils Absolute: 0.1 10*3/uL (ref 0.0–0.5)
Eosinophils Relative: 0 %
HCT: 37.8 % (ref 36.0–46.0)
Hemoglobin: 12.1 g/dL (ref 12.0–15.0)
Immature Granulocytes: 2 %
Lymphocytes Relative: 7 %
Lymphs Abs: 1.3 10*3/uL (ref 0.7–4.0)
MCH: 27.4 pg (ref 26.0–34.0)
MCHC: 32 g/dL (ref 30.0–36.0)
MCV: 85.5 fL (ref 80.0–100.0)
Monocytes Absolute: 1.1 10*3/uL — ABNORMAL HIGH (ref 0.1–1.0)
Monocytes Relative: 6 %
Neutro Abs: 16 10*3/uL — ABNORMAL HIGH (ref 1.7–7.7)
Neutrophils Relative %: 85 %
Platelets: 409 10*3/uL — ABNORMAL HIGH (ref 150–400)
RBC: 4.42 MIL/uL (ref 3.87–5.11)
RDW: 16 % — ABNORMAL HIGH (ref 11.5–15.5)
WBC: 19 10*3/uL — ABNORMAL HIGH (ref 4.0–10.5)
nRBC: 0 % (ref 0.0–0.2)

## 2021-03-24 LAB — COMPREHENSIVE METABOLIC PANEL
ALT: 21 U/L (ref 0–44)
AST: 18 U/L (ref 15–41)
Albumin: 3 g/dL — ABNORMAL LOW (ref 3.5–5.0)
Alkaline Phosphatase: 64 U/L (ref 38–126)
Anion gap: 6 (ref 5–15)
BUN: 30 mg/dL — ABNORMAL HIGH (ref 8–23)
CO2: 34 mmol/L — ABNORMAL HIGH (ref 22–32)
Calcium: 8.4 mg/dL — ABNORMAL LOW (ref 8.9–10.3)
Chloride: 94 mmol/L — ABNORMAL LOW (ref 98–111)
Creatinine, Ser: 0.64 mg/dL (ref 0.44–1.00)
GFR, Estimated: >60 mL/min (ref >60)
Glucose, Bld: 118 mg/dL — ABNORMAL HIGH (ref 70–99)
Potassium: 3.6 mmol/L (ref 3.5–5.1)
Sodium: 134 mmol/L — ABNORMAL LOW (ref 135–145)
Total Bilirubin: 0.5 mg/dL (ref 0.3–1.2)
Total Protein: 6 g/dL — ABNORMAL LOW (ref 6.5–8.1)

## 2021-03-24 LAB — C-REACTIVE PROTEIN: CRP: 11.7 mg/dL — ABNORMAL HIGH (ref <1.0)

## 2021-03-24 NOTE — Progress Notes (Signed)
Pt is self sufficient with CPT (flutter). Pt tolerates well and does it multiple times per pt.

## 2021-03-24 NOTE — Progress Notes (Signed)
PT Cancellation Note  Patient Details Name: Ashley Mack MRN: [id number] DOB: 1945-03-27   Cancelled Treatment:    Reason Eval/Treat Not Completed: Other (comment).  Pt just finished with OT upon arrival to room and pt requesting to rest due to finally getting her breathing under control.  Pt will be attempted to be seen at later date/time as medically appropriate.     Gwenlyn Saran, PT, DPT 03/24/21, 12:30 PM

## 2021-03-24 NOTE — Progress Notes (Signed)
PULMONOLOGY         Date: 03/24/2021,   MRN# [id number] Ashley Mack 1945-06-05     AdmissionWeight: 63.5 kg                 CurrentWeight: 52 kg   Referring physician: Dr Sloan Leiter   CHIEF COMPLAINT:   Increased O2 requirement   HISTORY OF PRESENT ILLNESS   This is a 76 yo F with hx of anemia, anxiety, OA, RA, retinopathy, NASH, CKD, chronic migraines, osteoporosis, emphysema who came in with worsening SOB/DOE.  Seh was seen at urgent care and post treatment continued to deteriorate.  Influenza A came back positive and influenza antigen was negative.  COVID-19 PCR came back negative.  D-dimer was significantly elevated at 6.44.After 5d treatment patient has not reached improvement substantially and requires incresead O2 with PCCM consultation for further evaluation and management.    03/23/21- patient with increased O2 req, I have reduced steroids and increased diuretic. Continue current regimen.  03/24/21- patient feels improved s/p gentle diuresis , repeat CXR inprocess, CMP  and CBCwdiff and CRP today.  PT/OT today. Steroids stopped.   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[place])    Iron (Fe) deficiency anemia    Migraine headache    in past   Migraines    Motion sickness    No natural teeth    Osteoporosis    Rheumatoid arthritis ([place])    Vertigo      SURGICAL HISTORY   Past Surgical History:  Procedure Laterality Date   CATARACT EXTRACTION W/PHACO Right 08/12/2016   Procedure: CATARACT EXTRACTION PHACO AND INTRAOCULAR LENS PLACEMENT ([place])  Right;  Surgeon: Leandrew Koyanagi, MD;  Location: [place];  Service: Ophthalmology;  Laterality: Right;   CATARACT EXTRACTION W/PHACO Left 09/30/2016   Procedure: CATARACT EXTRACTION PHACO AND INTRAOCULAR LENS PLACEMENT ([place]) Left;  Surgeon: Leandrew Koyanagi, MD;  Location: [facility];  Service: Ophthalmology;  Laterality: Left;   COLONOSCOPY     COLONOSCOPY WITH PROPOFOL N/A 12/09/2016   Procedure: COLONOSCOPY WITH PROPOFOL;  Surgeon: Toledo, Benay Pike, MD;  Location: [facility];  Service: Endoscopy;  Laterality: N/A;   ESOPHAGOGASTRODUODENOSCOPY     ESOPHAGOGASTRODUODENOSCOPY (EGD) WITH PROPOFOL N/A 12/09/2016   Procedure: ESOPHAGOGASTRODUODENOSCOPY (EGD) WITH PROPOFOL;  Surgeon: Toledo, Benay Pike, MD;  Location: [facility];  Service: Endoscopy;  Laterality: N/A;   KYPHOPLASTY N/A 08/10/2017   Procedure: Hewitt Shorts;  Surgeon: Hessie Knows, MD;  Location: [facility];  Service: Orthopedics;  Laterality: N/A;   KYPHOPLASTY N/A 09/09/2017   Procedure: [id number],E9;  Surgeon: Hessie Knows, MD;  Location: [facility];  Service: Orthopedics;  Laterality: N/A;   KYPHOPLASTY N/A 09/28/2017   Procedure: [id number];  Surgeon: Hessie Knows, MD;  Location: [facility];  Service: Orthopedics;  Laterality: N/A;   KYPHOPLASTY N/A 06/19/2019   Procedure: KYPHOPLASTY T5;  Surgeon: Hessie Knows, MD;  Location: [facility];  Service: Orthopedics;  Laterality: N/A;   OOPHORECTOMY Bilateral 2003   RIGHT/LEFT HEART CATH AND CORONARY ANGIOGRAPHY Bilateral 10/19/2019  Procedure: RIGHT/LEFT HEART CATH AND CORONARY ANGIOGRAPHY;  Surgeon: Yolonda Kida, MD;  Location: [facility];  Service: Cardiovascular;  Laterality: Bilateral;     FAMILY HISTORY   Family History  Problem Relation Age of Onset   Kidney disease Mother    Diabetes Mellitus II Brother      SOCIAL HISTORY   Social History   Tobacco Use   Smoking status: Former    Packs/day: 1.00    Types: Cigarettes    Quit date: 2009    Years since quitting: 14.0    Smokeless tobacco: Never  Vaping Use   Vaping Use: Never used  Substance Use Topics   Alcohol use: No   Drug use: No     MEDICATIONS    Home Medication:    Current Medication:  Current Facility-Administered Medications:    acetaminophen (TYLENOL) tablet 650 mg, 650 mg, Oral, Q6H PRN **OR** acetaminophen (TYLENOL) suppository 650 mg, 650 mg, Rectal, Q6H PRN, Mansy, Jan A, MD   ALPRAZolam Duanne Moron) tablet 0.25 mg, 0.25 mg, Oral, TID PRN, Sharion Settler, NP, 0.25 mg at 03/22/21 2224   cholecalciferol (VITAMIN D3) tablet 2,000 Units, 2,000 Units, Oral, Daily, Mansy, Jan A, MD, 2,000 Units at 03/23/21 0909   [START ON 03/31/2021] cyanocobalamin ((VITAMIN B-12)) injection 1,000 mcg, 1,000 mcg, Intramuscular, Q30 days, Mansy, Jan A, MD   cyclobenzaprine (FLEXERIL) tablet 5 mg, 5 mg, Oral, TID PRN, Mansy, Jan A, MD, 5 mg at 03/23/21 1328   enoxaparin (LOVENOX) injection 40 mg, 40 mg, Subcutaneous, Daily, Mansy, Jan A, MD, 40 mg at 03/23/21 0907   fluticasone (FLONASE) 50 MCG/ACT nasal spray 2 spray, 2 spray, Each Nare, Daily PRN, Mansy, Jan A, MD   furosemide (LASIX) injection 20 mg, 20 mg, Intravenous, BID, Lanney Gins, Kayelynn Abdou, MD, 20 mg at 03/23/21 1428   guaiFENesin (MUCINEX) 12 hr tablet 600 mg, 600 mg, Oral, BID, Mansy, Jan A, MD, 600 mg at 03/23/21 2230   Ipratropium-Albuterol (COMBIVENT) respimat 2 puff, 2 puff, Inhalation, Q4H PRN, Mansy, Jan A, MD   ipratropium-albuterol (DUONEB) 0.5-2.5 (3) MG/3ML nebulizer solution 3 mL, 3 mL, Nebulization, TID, Barb Merino, MD, 3 mL at 03/24/21 0749   magnesium hydroxide (MILK OF MAGNESIA) suspension 30 mL, 30 mL, Oral, Daily PRN, Mansy, Jan A, MD   multivitamin with minerals tablet 1 tablet, 1 tablet, Oral, Daily, Mansy, Jan A, MD, 1 tablet at 03/23/21 1211   ondansetron (ZOFRAN) tablet 4 mg, 4 mg, Oral, Q6H PRN **OR** ondansetron (ZOFRAN) injection 4 mg, 4 mg, Intravenous, Q6H PRN, Mansy, Jan A, MD   pantoprazole (PROTONIX) EC tablet 40 mg, 40  mg, Oral, Daily, Mansy, Jan A, MD, 40 mg at 03/23/21 6789   sodium chloride (OCEAN) 0.65 % nasal spray 1 spray, 1 spray, Nasal, PRN, Mansy, Jan A, MD   traZODone (DESYREL) tablet 25 mg, 25 mg, Oral, QHS PRN, Mansy, Jan A, MD, 25 mg at 03/22/21 2224    ALLERGIES   Methotrexate derivatives, Other, Ranitidine, Sulfasalazine, and Topiramate     REVIEW OF SYSTEMS    Review of Systems:  Gen:  Denies  fever, sweats, chills weigh loss  HEENT: Denies blurred vision, double vision, ear pain, eye pain, hearing loss, nose bleeds, sore throat Cardiac:  No dizziness, chest pain or heaviness, chest tightness,edema Resp:   Denies cough or sputum porduction, shortness of breath,wheezing, hemoptysis,  Gi: Denies swallowing difficulty, stomach pain, nausea or vomiting, diarrhea, constipation, bowel incontinence Gu:  Denies bladder incontinence, burning urine Ext:  Denies Joint pain, stiffness or swelling Skin: Denies  skin rash, easy bruising or bleeding or hives Endoc:  Denies polyuria, polydipsia , polyphagia or weight change Psych:   Denies depression, insomnia or hallucinations   Other:  All other systems negative   VS: BP (!) 145/87 (BP Location: Left Arm)    Pulse (!) 115    Temp 99 F (37.2 C) (Oral)    Resp 18    Ht 5\' 2"  (1.575 m)    Wt 63 kg    SpO2 99%    BMI 25.42 kg/m      PHYSICAL EXAM    GENERAL:NAD, no fevers, chills, no weakness no fatigue HEAD: Normocephalic, atraumatic.  EYES: Pupils equal, round, reactive to light. Extraocular muscles intact. No scleral icterus.  MOUTH: Moist mucosal membrane. Dentition intact. No abscess noted.  EAR, NOSE, THROAT: Clear without exudates. No external lesions.  NECK: Supple. No thyromegaly. No nodules. No JVD.  PULMONARY: Diffuse coarse rhonchi right sided +wheezes CARDIOVASCULAR: S1 and S2. Regular rate and rhythm. No murmurs, rubs, or gallops. No edema. Pedal pulses 2+ bilaterally.  GASTROINTESTINAL: Soft, nontender, nondistended.  No masses. Positive bowel sounds. No hepatosplenomegaly.  MUSCULOSKELETAL: No swelling, clubbing, or edema. Range of motion full in all extremities.  NEUROLOGIC: Cranial nerves II through XII are intact. No gross focal neurological deficits. Sensation intact. Reflexes intact.  SKIN: No ulceration, lesions, rashes, or cyanosis. Skin warm and dry. Turgor intact.  PSYCHIATRIC: Mood, affect within normal limits. The patient is awake, alert and oriented x 3. Insight, judgment intact.       IMAGING    DG Chest 2 View  Result Date: 03/17/2021 CLINICAL DATA:  Shortness of breath. EXAM: CHEST - 2 VIEW COMPARISON:  March 11, 2021. FINDINGS: The heart size and mediastinal contours are within normal limits. Mild bibasilar subsegmental atelectasis is noted. Small pleural effusions may be present. Status post kyphoplasty at multiple levels of the thoracic spine. Multiple probable old compression fractures are noted as well. IMPRESSION: Mild bibasilar subsegmental atelectasis with small bilateral pleural effusions. Electronically Signed   By: Marijo Conception M.D.   On: 03/17/2021 15:36   DG Chest 2 View  Result Date: 03/11/2021 CLINICAL DATA:  Shortness of breath, cough EXAM: CHEST - 2 VIEW COMPARISON:  09/15/2020 FINDINGS: Cardiac size is within normal limits. Thoracic aorta is tortuous and ectatic. Increase in AP diameter of chest suggests COPD. There are no signs of alveolar pulmonary edema or focal pulmonary consolidation. There is improvement in aeration of lower lung fields. There is no pleural effusion or pneumothorax. Osteopenia is seen in bony structures. There is decrease in height of multiple thoracic vertebral bodies. There is previous vertebroplasty in multiple thoracic vertebral bodies. IMPRESSION: COPD. There are no signs of pulmonary edema or focal pulmonary consolidation. Electronically Signed   By: Elmer Picker M.D.   On: 03/11/2021 09:43   CT Angio Chest PE W and/or Wo  Contrast  Result Date: 03/18/2021 CLINICAL DATA:  Low oxygen saturation and elevated D-dimer. EXAM: CT ANGIOGRAPHY CHEST WITH CONTRAST TECHNIQUE: Multidetector CT imaging of the chest was performed using the standard protocol during bolus administration of intravenous contrast. Multiplanar CT image reconstructions and MIPs were obtained to evaluate the vascular anatomy. CONTRAST:  22mL OMNIPAQUE IOHEXOL 350 MG/ML SOLN COMPARISON:  June 16, 2019 FINDINGS: Cardiovascular: There is marked severity calcification of the thoracic aorta, without evidence of aortic aneurysm or dissection. Satisfactory opacification of the pulmonary arteries to the segmental level. No evidence of  pulmonary embolism. Normal heart size. No pericardial effusion. Mediastinum/Nodes: No enlarged mediastinal, hilar, or axillary lymph nodes. Thyroid gland, trachea, and esophagus demonstrate no significant findings. Lungs/Pleura: There is mild central lobular emphysema. Mild atelectatic changes are seen within the posterior aspects of the bilateral lung bases, right greater than left. There is no evidence of a pleural effusion or pneumothorax. Upper Abdomen: A 1.8 cm x 1.1 cm focus of parenchymal low attenuation is seen within the posterior aspect of the left lobe of the liver. Musculoskeletal: Compression fracture deformities of indeterminate age are seen involving the T6, T7, T8 and T9 vertebral bodies. This is most severe at the level of T7, which represents a new finding when compared to the prior study. Evidence of prior vertebroplasty is noted at the levels of T5, T11, T12, L1 and L2. Review of the MIP images confirms the above findings. IMPRESSION: 1. No evidence of pulmonary embolus. 2. Mild central lobular emphysema. 3. Compression fracture deformities of indeterminate age involving the T6, T7, T8 and T9 vertebral bodies, most severe at the level of T7. 4. Evidence of prior vertebroplasty at the levels of T5, T11, T12, L1 and L2. Aortic  Atherosclerosis (ICD10-I70.0) and Emphysema (ICD10-J43.9). Electronically Signed   By: Virgina Norfolk M.D.   On: 03/18/2021 04:00   US Venous Img Lower Bilateral (DVT)  Result Date: 03/19/2021 CLINICAL DATA:  Shortness of breath, leg pain EXAM: BILATERAL LOWER EXTREMITY VENOUS DOPPLER ULTRASOUND TECHNIQUE: Gray-scale sonography with compression, as well as color and duplex ultrasound, were performed to evaluate the deep venous system(s) from the level of the common femoral vein through the popliteal and proximal calf veins. COMPARISON:  None. FINDINGS: VENOUS Normal compressibility of BILATERAL common femoral, superficial femoral, and popliteal veins, as well as the visualized calf veins. Visualized portions of BILATERAL profunda femoral vein and great saphenous vein unremarkable. No filling defects to suggest DVT on grayscale or color Doppler imaging. Doppler waveforms show normal direction of venous flow, normal respiratory plasticity and response to augmentation. OTHER None. Limitations: none IMPRESSION: No evidence of deep venous thrombosis in either lower extremity. Electronically Signed   By: Lavonia Dana M.D.   On: 03/19/2021 09:59   DG Chest Port 1 View  Result Date: 03/21/2021 CLINICAL DATA:  Shortness of breath EXAM: PORTABLE CHEST 1 VIEW COMPARISON:  Previous studies including the examination of 03/17/2021 FINDINGS: Cardiac size is unremarkable. Thoracic aorta is tortuous and ectatic. There are no signs of pulmonary edema. There is crowding of markings in the lower lung fields with interval improvement. No new focal infiltrates are seen. There is no pleural effusion or pneumothorax. There is evidence of vertebroplasty in the multiple thoracic vertebral bodies IMPRESSION: There is interval improvement in aeration of lower lung fields suggesting resolving subsegmental atelectasis. There are no new infiltrates or signs of pulmonary edema. Electronically Signed   By: Elmer Picker M.D.   On:  03/21/2021 12:06   ECHOCARDIOGRAM COMPLETE  Result Date: 03/20/2021    ECHOCARDIOGRAM REPORT   Patient Name:   Ashley Mack Date of Exam: 03/20/2021 Medical Rec #:  [id number]     Height:       62.0 in Accession #:    [id number]    Weight:       139.0 lb Date of Birth:  22-May-1945     BSA:          1.638 m Patient Age:    83 years      BP:  128/78 mmHg Patient Gender: F             HR:           112 bpm. Exam Location:  [facility] Procedure: 2D Echo, Color Doppler and Cardiac Doppler Indications:     R06.00 Dyspnea  History:         Patient has no prior history of Echocardiogram examinations.                  COPD, Signs/Symptoms:Shortness of Breath; Risk                  Factors:Hypertension. Positive for influenza type A.  Sonographer:     Charmayne Sheer Referring Phys:  [id number] Barb Merino Diagnosing Phys: Isaias Cowman MD  Sonographer Comments: Suboptimal parasternal window and suboptimal subcostal window. Image acquisition challenging due to COPD. IMPRESSIONS  1. Left ventricular ejection fraction, by estimation, is 55 to 60%. The left ventricle has normal function. The left ventricle has no regional wall motion abnormalities. Left ventricular diastolic parameters are consistent with Grade I diastolic dysfunction (impaired relaxation).  2. Right ventricular systolic function is normal. The right ventricular size is normal.  3. The mitral valve is normal in structure. Mild mitral valve regurgitation. No evidence of mitral stenosis.  4. The aortic valve is normal in structure. Aortic valve regurgitation is not visualized. No aortic stenosis is present.  5. The inferior vena cava is normal in size with greater than 50% respiratory variability, suggesting right atrial pressure of 3 mmHg. FINDINGS  Left Ventricle: Left ventricular ejection fraction, by estimation, is 55 to 60%. The left ventricle has normal function. The left ventricle has no regional wall motion abnormalities. The left ventricular internal  cavity size was normal in size. There is  no left ventricular hypertrophy. Left ventricular diastolic parameters are consistent with Grade I diastolic dysfunction (impaired relaxation). Right Ventricle: The right ventricular size is normal. No increase in right ventricular wall thickness. Right ventricular systolic function is normal. Left Atrium: Left atrial size was normal in size. Right Atrium: Right atrial size was normal in size. Pericardium: There is no evidence of pericardial effusion. Mitral Valve: The mitral valve is normal in structure. Mild mitral valve regurgitation. No evidence of mitral valve stenosis. MV peak gradient, 5.3 mmHg. The mean mitral valve gradient is 3.0 mmHg. Tricuspid Valve: The tricuspid valve is normal in structure. Tricuspid valve regurgitation is mild . No evidence of tricuspid stenosis. Aortic Valve: The aortic valve is normal in structure. Aortic valve regurgitation is not visualized. No aortic stenosis is present. Aortic valve mean gradient measures 4.0 mmHg. Aortic valve peak gradient measures 7.2 mmHg. Aortic valve area, by VTI measures 3.56 cm. Pulmonic Valve: The pulmonic valve was normal in structure. Pulmonic valve regurgitation is not visualized. No evidence of pulmonic stenosis. Aorta: The aortic root is normal in size and structure. Venous: The inferior vena cava is normal in size with greater than 50% respiratory variability, suggesting right atrial pressure of 3 mmHg. IAS/Shunts: No atrial level shunt detected by color flow Doppler.  LEFT VENTRICLE PLAX 2D LVOT diam:     2.20 cm     Diastology LV SV:         84          LV e' medial:    6.42 cm/s LV SV Index:   52          LV E/e' medial:  16.5 LVOT Area:     3.80 cm  LV e' lateral:   7.07 cm/s                            LV E/e' lateral: 15.0  LV Volumes (MOD) LV vol d, MOD A2C: 48.6 ml LV vol d, MOD A4C: 62.2 ml LV vol s, MOD A2C: 24.0 ml LV vol s, MOD A4C: 32.5 ml LV SV MOD A2C:     24.6 ml LV SV MOD A4C:     62.2  ml LV SV MOD BP:      28.9 ml RIGHT VENTRICLE RV Basal diam:  3.16 cm LEFT ATRIUM             Index        RIGHT ATRIUM           Index LA Vol (A2C):   26.6 ml 16.24 ml/m  RA Area:     10.30 cm LA Vol (A4C):   23.0 ml 14.04 ml/m  RA Volume:   21.80 ml  13.31 ml/m LA Biplane Vol: 26.3 ml 16.06 ml/m  AORTIC VALVE                    PULMONIC VALVE AV Area (Vmax):    3.49 cm     PV Vmax:       1.20 m/s AV Area (Vmean):   3.31 cm     PV Vmean:      83.500 cm/s AV Area (VTI):     3.56 cm     PV VTI:        0.205 m AV Vmax:           134.00 cm/s  PV Peak grad:  5.8 mmHg AV Vmean:          98.100 cm/s  PV Mean grad:  3.0 mmHg AV VTI:            0.237 m AV Peak Grad:      7.2 mmHg AV Mean Grad:      4.0 mmHg LVOT Vmax:         123.00 cm/s LVOT Vmean:        85.500 cm/s LVOT VTI:          0.222 m LVOT/AV VTI ratio: 0.94  AORTA Ao Root diam: 2.80 cm MITRAL VALVE MV Area (PHT): 7.09 cm     SHUNTS MV Area VTI:   5.62 cm     Systemic VTI:  0.22 m MV Peak grad:  5.3 mmHg     Systemic Diam: 2.20 cm MV Mean grad:  3.0 mmHg MV Vmax:       1.15 m/s MV Vmean:      77.5 cm/s MV Decel Time: 107 msec MV E velocity: 106.00 cm/s MV A velocity: 113.00 cm/s MV E/A ratio:  0.94 Isaias Cowman MD Electronically signed by Isaias Cowman MD Signature Date/Time: 03/20/2021/1:43:48 PM    Final       ASSESSMENT/PLAN   Acute hypoxemic respiratory failure   Due to concomitant influenza A infection with COPD exacerbation and atelectasis with mild effusion of right pleural space.      - patient would benefit from pulmonary optimization  -c/w zithromax/rocepin  Influenza A infetion   - Tamiflu   - stopped Tessalon to allow expectoration    - Dcd Tussinex hinders respiratory effort   - dcd pulmicort - patient on IV steroids  03/24/21-dcd steroids   Mild pulmonary edema  - lasix 20 IV bid   Acute on Chronic COPD    - continue Duoneb    - patient has allergy to Trelegy and Anoro    - continue steriods  - reduced solumedrol to 40 once daily IV>>20>>dcd     - IS and flutter are at bedside   Bibasilar atelectasis    Patient would benefit from aggressive recruitment maneuvers -PT/OT should be daily    Thank you for allowing me to participate in the care of this patient.  Total face to face encounter time for this patient visit was >45 min. >50% of the time was  spent in counseling and coordination of care.   Patient/Family are satisfied with care plan and all questions have been answered.  This document was prepared using Dragon voice recognition software and may include unintentional dictation errors.     Ottie Glazier, M.D.  Division of [place]

## 2021-03-24 NOTE — Progress Notes (Signed)
PROGRESS NOTE    Ashley Mack  PPJ:[id number] DOB: 11-10-1945 DOA: 03/18/2021 PCP: Kirk Ruths, MD    Brief Narrative:  76 year old with hypertension, anxiety, visually impaired, GERD, migraine and COPD not on home oxygen presented with acute onset of worsening dyspnea with associated congestive productive cough and wheezing for 1 week.  Recently treated with Levaquin and prednisone taper.  In the emergency room blood pressure stable.  Heart rate 110.  95% on 2 L oxygen.  Subsequently required high flow nasal cannula oxygen.  Influenza A positive.  COVID-19 negative.  D-dimer was 6.44.  CTA with no evidence of PE.  Showed emphysema and compression fractures.   Assessment & Plan:   Principal Problem:   COPD exacerbation ([place]) Active Problems:   COPD with acute exacerbation ([place])  COPD with acute exacerbation due to acute influenza A infection: Patient had persistent symptoms.  Some clinical improvement today.  Repeat chest x-ray today. Was on aggressive steroids, tapered off today. Continue aggressive bronchodilator therapy, intermittent diuresis and chest physiotherapy. Completed 5 days of IV Rocephin.  Completed Tamiflu for 5 days. CTA with no evidence of pulmonary embolism, no evidence of pneumonia. 2D echocardiogram essentially normal. Lower extremity duplex is negative for DVT. Due to significant persistent findings, followed by pulmonary. To help coordinate and counsel given advanced COPD, will consult palliative care.  Leukocytosis: Due to high-dose steroid use.  No evidence of bacterial infection.  Will monitor.    Essential hypertension, not on treatment.  Hydralazine as needed.  Started on amlodipine that patient did not tolerate with flushing and palpitation.  Discontinued.    DVT prophylaxis: enoxaparin (LOVENOX) injection 40 mg Start: 03/18/21 1000   Code Status: Full code Family Communication: None Disposition Plan: Status is: Inpatient  Remains  inpatient appropriate because: Still on oxygen.  Significant tachypnea and tachycardia .  Consultants:  Pulmonary  Procedures:  None  Antimicrobials:  Rocephin 1/2--- 1/6 Tamiflu 1/3--1/7   Subjective:  Patient seen and examined.  No overnight events.  Breathing is somehow better today but is still on 6 L oxygen.  Not mobilized out of bed yet.  Objective: Vitals:   03/23/21 2024 03/24/21 0338 03/24/21 0700 03/24/21 0720  BP: (!) 142/72 124/61  (!) 145/87  Pulse: (!) 123 (!) 118  (!) 115  Resp: (!) 24 20  18   Temp: 98.9 F (37.2 C) 98.6 F (37 C) 99 F (37.2 C) 99 F (37.2 C)  TempSrc: Oral Oral Oral Oral  SpO2: 94% 100%  99%  Weight:      Height:        Intake/Output Summary (Last 24 hours) at 03/24/2021 1105 Last data filed at 03/24/2021 0300 Gross per 24 hour  Intake 0 ml  Output 600 ml  Net -600 ml    Filed Weights   03/17/21 1444 03/19/21 2148  Weight: 63.5 kg 63 kg    Examination:  General: Patient is chronically sick looking but not in any distress.  Tachypneic on talking.  On 6 L oxygen. Cardiovascular: S1-S2 normal.  Regular rate rhythm.  Tachycardic. Respiratory: Poor bilateral air entry.  Expiratory wheezes present. Gastrointestinal: Soft.  Nontender.  Bowel sound present. Ext: Chronic nonpitting edema.  Paresthesia and tender legs. Neuro: Alert oriented x4.  She is visually impaired.  Equal strength in all extremities. Musculoskeletal: No joint deformities.     Data Reviewed: I have personally reviewed following labs and imaging studies  CBC: Recent Labs  Lab 03/17/21 1455 03/18/21 0630 03/20/21 0551  03/22/21 0310 03/24/21 0913  WBC 10.9* 15.4* 21.9* 20.6* 19.0*  NEUTROABS 9.3*  --  20.5* 19.0* 16.0*  HGB 12.2 12.1 10.1* 11.5* 12.1  HCT 38.3 37.3 30.9* 35.8* 37.8  MCV 87.0 86.3 85.4 84.8 85.5  PLT 368 342 364 414* 397*   Basic Metabolic Panel: Recent Labs  Lab 03/17/21 1455 03/18/21 0630 03/20/21 0551 03/24/21 0913  NA 128*  130* 137 134*  K 4.6 4.6 4.5 3.6  CL 91* 91* 101 94*  CO2 27 25 29  34*  GLUCOSE 111* 136* 155* 118*  BUN 19 26* 27* 30*  CREATININE 0.89 1.06* 0.85 0.64  CALCIUM 8.5* 8.4* 8.4* 8.4*  MG  --   --  2.4  --   PHOS  --   --  3.3  --    GFR: Estimated Creatinine Clearance: 51.4 mL/min (by C-G formula based on SCr of 0.64 mg/dL). Liver Function Tests: Recent Labs  Lab 03/17/21 1455 03/24/21 0913  AST 22 18  ALT 19 21  ALKPHOS 54 64  BILITOT 0.5 0.5  PROT 6.5 6.0*  ALBUMIN 3.1* 3.0*   No results for input(s): LIPASE, AMYLASE in the last 168 hours. No results for input(s): AMMONIA in the last 168 hours. Coagulation Profile: No results for input(s): INR, PROTIME in the last 168 hours. Cardiac Enzymes: No results for input(s): CKTOTAL, CKMB, CKMBINDEX, TROPONINI in the last 168 hours. BNP (last 3 results) No results for input(s): PROBNP in the last 8760 hours. HbA1C: No results for input(s): HGBA1C in the last 72 hours. CBG: No results for input(s): GLUCAP in the last 168 hours. Lipid Profile: No results for input(s): CHOL, HDL, LDLCALC, TRIG, CHOLHDL, LDLDIRECT in the last 72 hours. Thyroid Function Tests: No results for input(s): TSH, T4TOTAL, FREET4, T3FREE, THYROIDAB in the last 72 hours. Anemia Panel: No results for input(s): VITAMINB12, FOLATE, FERRITIN, TIBC, IRON, RETICCTPCT in the last 72 hours. Sepsis Labs: No results for input(s): PROCALCITON, LATICACIDVEN in the last 168 hours.  Recent Results (from the past 240 hour(s))  Resp Panel by RT-PCR (Flu A&B, Covid) Nasopharyngeal Swab     Status: Abnormal   Collection Time: 03/17/21  2:55 PM   Specimen: Nasopharyngeal Swab; Nasopharyngeal(NP) swabs in vial transport medium  Result Value Ref Range Status   SARS Coronavirus 2 by RT PCR NEGATIVE NEGATIVE Final    Comment: (NOTE) SARS-CoV-2 target nucleic acids are NOT DETECTED.  The SARS-CoV-2 RNA is generally detectable in upper respiratory specimens during the acute  phase of infection. The lowest concentration of SARS-CoV-2 viral copies this assay can detect is 138 copies/mL. A negative result does not preclude SARS-Cov-2 infection and should not be used as the sole basis for treatment or other patient management decisions. A negative result may occur with  improper specimen collection/handling, submission of specimen other than nasopharyngeal swab, presence of viral mutation(s) within the areas targeted by this assay, and inadequate number of viral copies(<138 copies/mL). A negative result must be combined with clinical observations, patient history, and epidemiological information. The expected result is Negative.  Fact Sheet for Patients:  EntrepreneurPulse.com.au  Fact Sheet for Healthcare Providers:  IncredibleEmployment.be  This test is no t yet approved or cleared by the [place] FDA and  has been authorized for detection and/or diagnosis of SARS-CoV-2 by FDA under an Emergency Use Authorization (EUA). This EUA will remain  in effect (meaning this test can be used) for the duration of the COVID-19 declaration under Section 564(b)(1) of the Act, 21 U.S.C.section  360bbb-3(b)(1), unless the authorization is terminated  or revoked sooner.       Influenza A by PCR POSITIVE (A) NEGATIVE Final   Influenza B by PCR NEGATIVE NEGATIVE Final    Comment: (NOTE) The Xpert Xpress SARS-CoV-2/FLU/RSV plus assay is intended as an aid in the diagnosis of influenza from Nasopharyngeal swab specimens and should not be used as a sole basis for treatment. Nasal washings and aspirates are unacceptable for Xpert Xpress SARS-CoV-2/FLU/RSV testing.  Fact Sheet for Patients: EntrepreneurPulse.com.au  Fact Sheet for Healthcare Providers: IncredibleEmployment.be  This test is not yet approved or cleared by the [place] FDA and has been authorized for detection and/or diagnosis  of SARS-CoV-2 by FDA under an Emergency Use Authorization (EUA). This EUA will remain in effect (meaning this test can be used) for the duration of the COVID-19 declaration under Section 564(b)(1) of the Act, 21 U.S.C. section 360bbb-3(b)(1), unless the authorization is terminated or revoked.  Performed at [facility], [address] [place], [place] [address]          Radiology Studies: DG Chest [place] 1 View  Result Date: 03/24/2021 CLINICAL DATA:  Difficulty breathing EXAM: PORTABLE CHEST 1 VIEW COMPARISON:  Previous studies including the examination of 03/21/2021 FINDINGS: Transverse diameter of heart is slightly increased. Thoracic aorta is tortuous and ectatic. There are small linear densities in the lower lung fields, more so on the left side with no significant interval change. There is no focal pulmonary consolidation. There is no significant pleural effusion or pneumothorax. IMPRESSION: Linear densities in the lower lung fields have not changed significantly. There are no new focal infiltrates or signs of pulmonary edema. Electronically Signed   By: Elmer Picker M.D.   On: 03/24/2021 09:50        Scheduled Meds:  cholecalciferol  2,000 Units Oral Daily   [START ON 03/31/2021] cyanocobalamin  1,000 mcg Intramuscular Q30 days   enoxaparin (LOVENOX) injection  40 mg Subcutaneous Daily   furosemide  20 mg Intravenous BID   guaiFENesin  600 mg Oral BID   ipratropium-albuterol  3 mL Nebulization TID   multivitamin with minerals  1 tablet Oral Daily   pantoprazole  40 mg Oral Daily   Continuous Infusions:     LOS: 6 days    Time spent: 35 minutes    Barb Merino, MD [facility] Pager [phone]

## 2021-03-24 NOTE — Plan of Care (Signed)
°  Problem: Education: Goal: Knowledge of disease or condition will improve Outcome: Progressing Goal: Knowledge of the prescribed therapeutic regimen will improve Outcome: Progressing   Problem: Activity: Goal: Ability to tolerate increased activity will improve Outcome: Progressing Goal: Will verbalize the importance of balancing activity with adequate rest periods Outcome: Progressing   Problem: Respiratory: Goal: Ability to maintain a clear airway will improve Outcome: Progressing Goal: Levels of oxygenation will improve Outcome: Progressing Goal: Ability to maintain adequate ventilation will improve Outcome: Progressing   Problem: Education: Goal: Knowledge of General Education information will improve Description: Including pain rating scale, medication(s)/side effects and non-pharmacologic comfort measures Outcome: Progressing   Problem: Health Behavior/Discharge Planning: Goal: Ability to manage health-related needs will improve Outcome: Progressing   Problem: Clinical Measurements: Goal: Ability to maintain clinical measurements within normal limits will improve Outcome: Progressing Goal: Will remain free from infection Outcome: Progressing Goal: Diagnostic test results will improve Outcome: Progressing Goal: Respiratory complications will improve Outcome: Progressing Goal: Cardiovascular complication will be avoided Outcome: Progressing   Problem: Activity: Goal: Risk for activity intolerance will decrease Outcome: Progressing   Problem: Nutrition: Goal: Adequate nutrition will be maintained Outcome: Progressing   Problem: Coping: Goal: Level of anxiety will decrease Outcome: Progressing   Problem: Elimination: Goal: Will not experience complications related to bowel motility Outcome: Progressing Goal: Will not experience complications related to urinary retention Outcome: Progressing   Problem: Pain Managment: Goal: General experience of comfort  will improve Outcome: Progressing   Problem: Skin Integrity: Goal: Risk for impaired skin integrity will decrease Outcome: Progressing

## 2021-03-24 NOTE — Progress Notes (Signed)
PT Cancellation Note  Patient Details Name: Ashley Mack MRN: [id number] DOB: October 06, 1945   Cancelled Treatment:    Reason Eval/Treat Not Completed: Other (comment).  Attempted to see pt this PM for session, but pt refusing due to pain in the back and the ribs.  Pt requested to be seen in AM of subsequent day, 03/25/21.  Will attempt to see as medically appropriate.     Gwenlyn Saran, PT, DPT 03/24/21, 3:37 PM

## 2021-03-24 NOTE — Progress Notes (Signed)
Occupational Therapy Treatment Patient Details Name: Ashley Mack MRN: [id number] DOB: 1945/07/23 Today's Date: 03/24/2021   History of present illness Pt is a 76 y.o. female with medical history significant for hypertension, anxiety, GERD, migraine, rheumatoid arthritis, fatty liver and COPD, who presented to emergency room with acute onset of worsening dyspnea with associated congested productive cough and wheezing for the last week. Workup +flu.   OT comments  Pt seen for OT treatment on this date. Upon arrival to room, pt awake and seated upright in bed. Pt agreeable to OT tx. Pt currently requires SUPERVISION for bed mobility, MIN A for stand pivot transfer to [facility], MIN GUARD for sit>stand peri-care, and SUPERVISION/SET-UP for seated hand hygiene d/t decreased strength and activity tolerance. SpO2 88% following stand pivot transfer (SpO2 increased to 92% within 2 mins of activity cessation). At end of session, pt left sitting upright in chair, with all needs within reach and in no acute distress. Pt is making good progress toward goals and continues to benefit from skilled OT services to maximize return to PLOF and minimize risk of future falls, injury, caregiver burden, and readmission. Will continue to follow POC. Discharge recommendation remains appropriate.     Recommendations for follow up therapy are one component of a multi-disciplinary discharge planning process, led by the attending physician.  Recommendations may be updated based on patient status, additional functional criteria and insurance authorization.    Follow Up Recommendations  Home health OT    Assistance Recommended at Discharge Frequent or constant Supervision/Assistance  Patient can return home with the following  A little help with walking and/or transfers;A little help with bathing/dressing/bathroom;Assistance with cooking/housework;Help with stairs or ramp for entrance   Equipment Recommendations  BSC/3in1        Precautions / Restrictions Precautions Precautions: Fall Restrictions Weight Bearing Restrictions: No       Mobility Bed Mobility Overal bed mobility: Needs Assistance Bed Mobility: Supine to Sit;Sit to Supine     Supine to sit: Supervision;HOB elevated Sit to supine: Supervision;HOB elevated        Transfers Overall transfer level: Needs assistance Equipment used: Rolling walker (2 wheels) Transfers: Sit to/from Stand;Bed to chair/wheelchair/BSC Sit to Stand: Min guard Stand pivot transfers: Min assist         General transfer comment: MIN A via 1-person HHA to [facility]     Balance Overall balance assessment: Needs assistance Sitting-balance support: Feet supported Sitting balance-Leahy Scale: Good Sitting balance - Comments: good sitting balance reaching within BOS at EOB   Standing balance support: Single extremity supported;During functional activity Standing balance-Leahy Scale: Poor Standing balance comment: Requires MIN A via 1-person HHA for stand pivot transfer to [facility]                           ADL either performed or assessed with clinical judgement   ADL Overall ADL's : Needs assistance/impaired     Grooming: Wash/dry hands;Supervision/safety;Set up;Sitting                   Toilet Transfer: Minimal assistance;Stand-pivot;BSC/3in1   Toileting- Clothing Manipulation and Hygiene: Min guard;Sit to/from stand Toileting - Clothing Manipulation Details (indicate cue type and reason): MIN GUARD for sit>stand peri-care              Cognition Arousal/Alertness: Awake/alert Behavior During Therapy: WFL for tasks assessed/performed Overall Cognitive Status: Within Functional Limits for tasks assessed  General Comments SpO2 88% following stand pivot transfer. Increased to 92% within 2 mins of activity cessation    Pertinent Vitals/ Pain       Pain Assessment:  No/denies pain         Frequency  Min 2X/week        Progress Toward Goals  OT Goals(current goals can now be found in the care plan section)  Progress towards OT goals: Progressing toward goals  Acute Rehab OT Goals Patient Stated Goal: to go home OT Goal Formulation: With patient/family Time For Goal Achievement: 04/03/21 Potential to Achieve Goals: Good  Plan Discharge plan remains appropriate;Frequency remains appropriate       AM-PAC OT "6 Clicks" Daily Activity     Outcome Measure   Help from another person eating meals?: None Help from another person taking care of personal grooming?: A Little Help from another person toileting, which includes using toliet, bedpan, or urinal?: A Little Help from another person bathing (including washing, rinsing, drying)?: A Little Help from another person to put on and taking off regular upper body clothing?: None Help from another person to put on and taking off regular lower body clothing?: A Little 6 Click Score: 20    End of Session Equipment Utilized During Treatment: Oxygen  OT Visit Diagnosis: Other abnormalities of gait and mobility (R26.89)   Activity Tolerance Patient tolerated treatment well   Patient Left in chair;with call bell/phone within reach;with chair alarm set   Nurse Communication Mobility status        Time: 1106-1140 OT Time Calculation (min): 34 min  Charges: OT General Charges $OT Visit: 1 Visit OT Treatments $Self Care/Home Management : 23-37 mins  Fredirick Maudlin, OTR/L [place]

## 2021-03-25 DIAGNOSIS — R918 Other nonspecific abnormal finding of lung field: Secondary | ICD-10-CM

## 2021-03-25 DIAGNOSIS — Z66 Do not resuscitate: Secondary | ICD-10-CM

## 2021-03-25 DIAGNOSIS — Z515 Encounter for palliative care: Secondary | ICD-10-CM

## 2021-03-25 MED ORDER — DICLOFENAC SODIUM 1 % EX GEL
4.0000 g | Freq: Four times a day (QID) | CUTANEOUS | Status: DC
  Administered 2021-03-25 – 2021-03-30 (×11): 4 g via TOPICAL
  Filled 2021-03-25: qty 100

## 2021-03-25 MED ORDER — PREDNISONE 5 MG PO TABS
5.0000 mg | ORAL_TABLET | Freq: Every day | ORAL | Status: DC
  Administered 2021-03-25 – 2021-04-01 (×8): 5 mg via ORAL
  Filled 2021-03-25 (×9): qty 1

## 2021-03-25 NOTE — Progress Notes (Signed)
Physical Therapy Treatment Patient Details Name: Ashley Mack MRN: [id number] DOB: 03/26/1945 Today's Date: 03/25/2021   History of Present Illness Pt is a 76 y.o. female with medical history significant for hypertension, anxiety, GERD, migraine, rheumatoid arthritis, fatty liver and COPD, who presented to emergency room with acute onset of worsening dyspnea with associated congested productive cough and wheezing for the last week. Workup +flu.    PT Comments    Patient was alert and in bed, and reported having sat in her recliner earlier in the day. Pt reported having some rib/back pain due to sitting in recliner for extended period of time which was remained constant since returning to bed. As a result, LE and UE exercises consisting of Ankle Pumps, Short Arc Quad, Heel Slides, Hip ABDuction/ADDuction, and PT resisted UE push/pull exercises were completed w/ AROM for 10 reps bilaterally. Pt reported no increase in pain following completion of the exercises and SpO2 remained WFL. Pt will benefit from continued skilled PT in order to restore PLOF.    Recommendations for follow up therapy are one component of a multi-disciplinary discharge planning process, led by the attending physician.  Recommendations may be updated based on patient status, additional functional criteria and insurance authorization.  Follow Up Recommendations  Skilled nursing-short term rehab (<3 hours/day)     Assistance Recommended at Discharge Frequent or constant Supervision/Assistance  Patient can return home with the following A little help with walking and/or transfers;A lot of help with bathing/dressing/bathroom;Assist for transportation;Assistance with cooking/housework;Help with stairs or ramp for entrance   Equipment Recommendations  Rolling walker (2 wheels);BSC/3in1    Recommendations for Other Services       Precautions / Restrictions Precautions Precautions: Fall Restrictions Weight Bearing  Restrictions: No     Mobility  Bed Mobility                    Transfers                        Ambulation/Gait                   Stairs             Wheelchair Mobility    Modified Rankin (Stroke Patients Only)       Balance                                            Cognition Arousal/Alertness: Awake/alert Behavior During Therapy: WFL for tasks assessed/performed Overall Cognitive Status: Within Functional Limits for tasks assessed                                          Exercises General Exercises - Lower Extremity Ankle Circles/Pumps: AROM;Both;10 reps Short Arc Quad: AROM;Both;10 reps Heel Slides: AROM;Both;10 reps Hip ABduction/ADduction: AROM;Both;10 reps Other Exercises Other Exercises: PT resisted push/pull UE Bx10    General Comments        Pertinent Vitals/Pain Pain Score: 0-No pain Pain Descriptors / Indicators: Grimacing;Moaning;Discomfort Pain Intervention(s): Limited activity within patient's tolerance;Monitored during session;Repositioned    Home Living                          Prior Function  PT Goals (current goals can now be found in the care plan section) Acute Rehab PT Goals Patient Stated Goal: to go home PT Goal Formulation: With patient Time For Goal Achievement: 04/02/21 Potential to Achieve Goals: Good Progress towards PT goals: Progressing toward goals    Frequency    Min 2X/week      PT Plan Current plan remains appropriate    Co-evaluation              AM-PAC PT "6 Clicks" Mobility   Outcome Measure  Help needed turning from your back to your side while in a flat bed without using bedrails?: A Lot Help needed moving from lying on your back to sitting on the side of a flat bed without using bedrails?: A Lot Help needed moving to and from a bed to a chair (including a wheelchair)?: A Lot Help needed standing up from  a chair using your arms (e.g., wheelchair or bedside chair)?: A Lot Help needed to walk in hospital room?: A Lot Help needed climbing 3-5 steps with a railing? : A Lot 6 Click Score: 12    End of Session Equipment Utilized During Treatment: Oxygen Activity Tolerance: Patient limited by pain (Pt expressed having some rib/back pain due to sitting in recliner that was causing discomfort throughout session.) Patient left: in bed;with call bell/phone within reach;with bed alarm set   PT Visit Diagnosis: Other abnormalities of gait and mobility (R26.89);Difficulty in walking, not elsewhere classified (R26.2);Muscle weakness (generalized) (M62.81)     Time: 7619-5093 PT Time Calculation (min) (ACUTE ONLY): 20 min  Charges:  $Therapeutic Exercise: 8-22 mins                    Lieutenant Diego PT, DPT 4:20 PM,03/25/21

## 2021-03-25 NOTE — Progress Notes (Signed)
PULMONOLOGY         Date: 03/25/2021,   MRN# [id number] Ashley Mack 09-25-1945     AdmissionWeight: 63.5 kg                 CurrentWeight: 36 kg   Referring physician: Dr Sloan Leiter   CHIEF COMPLAINT:   Increased O2 requirement   HISTORY OF PRESENT ILLNESS   This is a 76 yo F with hx of anemia, anxiety, OA, RA, retinopathy, NASH, CKD, chronic migraines, osteoporosis, emphysema who came in with worsening SOB/DOE.  Seh was seen at urgent care and post treatment continued to deteriorate.  Influenza A came back positive and influenza antigen was negative.  COVID-19 PCR came back negative.  D-dimer was significantly elevated at 6.44.After 5d treatment patient has not reached improvement substantially and requires incresead O2 with PCCM consultation for further evaluation and management.    03/23/21- patient with increased O2 req, I have reduced steroids and increased diuretic. Continue current regimen.  03/24/21- patient feels improved s/p gentle diuresis , repeat CXR inprocess, CMP  and CBCwdiff and CRP today.  PT/OT today. Steroids stopped.    03/25/21- patient slowly improved, O2 weaned down from 6 to 4L/min continue gentle diuresis and PT.   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[place])    Iron (Fe) deficiency anemia    Migraine headache    in past   Migraines    Motion sickness    No natural teeth    Osteoporosis    Rheumatoid arthritis ([place])    Vertigo      SURGICAL HISTORY   Past Surgical History:  Procedure Laterality Date   CATARACT EXTRACTION W/PHACO Right 08/12/2016   Procedure: CATARACT EXTRACTION  PHACO AND INTRAOCULAR LENS PLACEMENT ([place])  Right;  Surgeon: Leandrew Koyanagi, MD;  Location: [place];  Service: Ophthalmology;  Laterality: Right;   CATARACT EXTRACTION W/PHACO Left 09/30/2016   Procedure: CATARACT EXTRACTION PHACO AND INTRAOCULAR LENS PLACEMENT ([place]) Left;  Surgeon: Leandrew Koyanagi, MD;  Location: [place];  Service: Ophthalmology;  Laterality: Left;   COLONOSCOPY     COLONOSCOPY WITH PROPOFOL N/A 12/09/2016   Procedure: COLONOSCOPY WITH PROPOFOL;  Surgeon: Toledo, Benay Pike, MD;  Location: [facility];  Service: Endoscopy;  Laterality: N/A;   ESOPHAGOGASTRODUODENOSCOPY     ESOPHAGOGASTRODUODENOSCOPY (EGD) WITH PROPOFOL N/A 12/09/2016   Procedure: ESOPHAGOGASTRODUODENOSCOPY (EGD) WITH PROPOFOL;  Surgeon: Toledo, Benay Pike, MD;  Location: [facility];  Service: Endoscopy;  Laterality: N/A;   KYPHOPLASTY N/A 08/10/2017   Procedure: Hewitt Shorts;  Surgeon: Hessie Knows, MD;  Location: [facility];  Service: Orthopedics;  Laterality: N/A;   KYPHOPLASTY N/A 09/09/2017   Procedure: [id number]-B0,F7;  Surgeon: Hessie Knows, MD;  Location: [facility];  Service: Orthopedics;  Laterality: N/A;   KYPHOPLASTY N/A 09/28/2017   Procedure: [id number];  Surgeon: Hessie Knows, MD;  Location: [facility];  Service: Orthopedics;  Laterality: N/A;   KYPHOPLASTY N/A 06/19/2019   Procedure: KYPHOPLASTY T5;  Surgeon: Hessie Knows, MD;  Location: [facility];  Service:  Orthopedics;  Laterality: N/A;   OOPHORECTOMY Bilateral 2003   RIGHT/LEFT HEART CATH AND CORONARY ANGIOGRAPHY Bilateral 10/19/2019   Procedure: RIGHT/LEFT HEART CATH AND CORONARY ANGIOGRAPHY;  Surgeon: Yolonda Kida, MD;  Location: [place] [facility];  Service: Cardiovascular;  Laterality: Bilateral;     FAMILY HISTORY   Family History  Problem Relation Age of Onset   Kidney disease Mother    Diabetes Mellitus II Brother      SOCIAL HISTORY   Social History   Tobacco Use   Smoking  status: Former    Packs/day: 1.00    Types: Cigarettes    Quit date: 2009    Years since quitting: 14.0   Smokeless tobacco: Never  Vaping Use   Vaping Use: Never used  Substance Use Topics   Alcohol use: No   Drug use: No     MEDICATIONS    Home Medication:    Current Medication:  Current Facility-Administered Medications:    acetaminophen (TYLENOL) tablet 650 mg, 650 mg, Oral, Q6H PRN **OR** acetaminophen (TYLENOL) suppository 650 mg, 650 mg, Rectal, Q6H PRN, Mansy, Jan A, MD   ALPRAZolam Duanne Moron) tablet 0.25 mg, 0.25 mg, Oral, TID PRN, Sharion Settler, NP, 0.25 mg at 03/22/21 2224   cholecalciferol (VITAMIN D3) tablet 2,000 Units, 2,000 Units, Oral, Daily, Mansy, Jan A, MD, 2,000 Units at 03/25/21 1058   [START ON 03/31/2021] cyanocobalamin ((VITAMIN B-12)) injection 1,000 mcg, 1,000 mcg, Intramuscular, Q30 days, Mansy, Jan A, MD   cyclobenzaprine (FLEXERIL) tablet 5 mg, 5 mg, Oral, TID PRN, Mansy, Jan A, MD, 5 mg at 03/23/21 1328   diclofenac Sodium (VOLTAREN) 1 % topical gel 4 g, 4 g, Topical, QID, Ghimire, Kuber, MD, 4 g at 03/25/21 1227   enoxaparin (LOVENOX) injection 40 mg, 40 mg, Subcutaneous, Daily, Mansy, Jan A, MD, 40 mg at 03/25/21 1058   fluticasone (FLONASE) 50 MCG/ACT nasal spray 2 spray, 2 spray, Each Nare, Daily PRN, Mansy, Jan A, MD   furosemide (LASIX) injection 20 mg, 20 mg, Intravenous, BID, Lanney Gins, Dollie Bressi, MD, 20 mg at 03/25/21 1058   guaiFENesin (MUCINEX) 12 hr tablet 600 mg, 600 mg, Oral, BID, Mansy, Jan A, MD, 600 mg at 03/25/21 1058   Ipratropium-Albuterol (COMBIVENT) respimat 2 puff, 2 puff, Inhalation, Q4H PRN, Mansy, Jan A, MD   ipratropium-albuterol (DUONEB) 0.5-2.5 (3) MG/3ML nebulizer solution 3 mL, 3 mL, Nebulization, TID, Ghimire, Kuber, MD, 3 mL at 03/25/21 1324   magnesium hydroxide (MILK OF MAGNESIA) suspension 30 mL, 30 mL, Oral, Daily PRN, Mansy, Jan A, MD   multivitamin with minerals tablet 1 tablet, 1 tablet, Oral, Daily, Mansy, Jan A, MD,  1 tablet at 03/25/21 1058   ondansetron (ZOFRAN) tablet 4 mg, 4 mg, Oral, Q6H PRN **OR** ondansetron (ZOFRAN) injection 4 mg, 4 mg, Intravenous, Q6H PRN, Mansy, Jan A, MD   pantoprazole (PROTONIX) EC tablet 40 mg, 40 mg, Oral, Daily, Mansy, Jan A, MD, 40 mg at 03/25/21 1058   predniSONE (DELTASONE) tablet 5 mg, 5 mg, Oral, Q breakfast, Dallie Piles, RPH   sodium chloride (OCEAN) 0.65 % nasal spray 1 spray, 1 spray, Nasal, PRN, Mansy, Jan A, MD   traZODone (DESYREL) tablet 25 mg, 25 mg, Oral, QHS PRN, Mansy, Jan A, MD, 25 mg at 03/22/21 2224    ALLERGIES   Methotrexate derivatives, Other, Ranitidine, Sulfasalazine, and Topiramate     REVIEW OF SYSTEMS    Review of Systems:  Gen:  Denies  fever, sweats, chills weigh loss  HEENT: Denies blurred vision, double vision, ear pain, eye pain, hearing loss, nose bleeds, sore throat Cardiac:  No dizziness, chest pain or heaviness, chest tightness,edema Resp:   Denies cough or sputum porduction, shortness of breath,wheezing, hemoptysis,  Gi: Denies swallowing difficulty, stomach pain, nausea or vomiting, diarrhea, constipation, bowel incontinence Gu:  Denies bladder incontinence, burning urine Ext:   Denies Joint pain, stiffness or swelling Skin: Denies  skin rash, easy bruising or bleeding or hives Endoc:  Denies polyuria, polydipsia , polyphagia or weight change Psych:   Denies depression, insomnia or hallucinations   Other:  All other systems negative   VS: BP 117/75 (BP Location: Left Arm)    Pulse (!) 115    Temp 98.5 F (36.9 C) (Oral)    Resp 18    Ht 5\' 2"  (1.575 m)    Wt 63 kg    SpO2 97%    BMI 25.42 kg/m      PHYSICAL EXAM    GENERAL:NAD, no fevers, chills, no weakness no fatigue HEAD: Normocephalic, atraumatic.  EYES: Pupils equal, round, reactive to light. Extraocular muscles intact. No scleral icterus.  MOUTH: Moist mucosal membrane. Dentition intact. No abscess noted.  EAR, NOSE, THROAT: Clear without exudates.  No external lesions.  NECK: Supple. No thyromegaly. No nodules. No JVD.  PULMONARY: Diffuse coarse rhonchi right sided +wheezes CARDIOVASCULAR: S1 and S2. Regular rate and rhythm. No murmurs, rubs, or gallops. No edema. Pedal pulses 2+ bilaterally.  GASTROINTESTINAL: Soft, nontender, nondistended. No masses. Positive bowel sounds. No hepatosplenomegaly.  MUSCULOSKELETAL: No swelling, clubbing, or edema. Range of motion full in all extremities.  NEUROLOGIC: Cranial nerves II through XII are intact. No gross focal neurological deficits. Sensation intact. Reflexes intact.  SKIN: No ulceration, lesions, rashes, or cyanosis. Skin warm and dry. Turgor intact.  PSYCHIATRIC: Mood, affect within normal limits. The patient is awake, alert and oriented x 3. Insight, judgment intact.       IMAGING    DG Chest 2 View  Result Date: 03/17/2021 CLINICAL DATA:  Shortness of breath. EXAM: CHEST - 2 VIEW COMPARISON:  March 11, 2021. FINDINGS: The heart size and mediastinal contours are within normal limits. Mild bibasilar subsegmental atelectasis is noted. Small pleural effusions may be present. Status post kyphoplasty at multiple levels of the thoracic spine. Multiple probable old compression fractures are noted as well. IMPRESSION: Mild bibasilar subsegmental atelectasis with small bilateral pleural effusions. Electronically Signed   By: Marijo Conception M.D.   On: 03/17/2021 15:36   DG Chest 2 View  Result Date: 03/11/2021 CLINICAL DATA:  Shortness of breath, cough EXAM: CHEST - 2 VIEW COMPARISON:  09/15/2020 FINDINGS: Cardiac size is within normal limits. Thoracic aorta is tortuous and ectatic. Increase in AP diameter of chest suggests COPD. There are no signs of alveolar pulmonary edema or focal pulmonary consolidation. There is improvement in aeration of lower lung fields. There is no pleural effusion or pneumothorax. Osteopenia is seen in bony structures. There is decrease in height of multiple thoracic  vertebral bodies. There is previous vertebroplasty in multiple thoracic vertebral bodies. IMPRESSION: COPD. There are no signs of pulmonary edema or focal pulmonary consolidation. Electronically Signed   By: Elmer Picker M.D.   On: 03/11/2021 09:43   CT Angio Chest PE W and/or Wo Contrast  Result Date: 03/18/2021 CLINICAL DATA:  Low oxygen saturation and elevated D-dimer. EXAM: CT ANGIOGRAPHY CHEST WITH CONTRAST TECHNIQUE: Multidetector CT imaging of the chest was performed using the standard protocol during bolus  administration of intravenous contrast. Multiplanar CT image reconstructions and MIPs were obtained to evaluate the vascular anatomy. CONTRAST:  58mL OMNIPAQUE IOHEXOL 350 MG/ML SOLN COMPARISON:  June 16, 2019 FINDINGS: Cardiovascular: There is marked severity calcification of the thoracic aorta, without evidence of aortic aneurysm or dissection. Satisfactory opacification of the pulmonary arteries to the segmental level. No evidence of pulmonary embolism. Normal heart size. No pericardial effusion. Mediastinum/Nodes: No enlarged mediastinal, hilar, or axillary lymph nodes. Thyroid gland, trachea, and esophagus demonstrate no significant findings. Lungs/Pleura: There is mild central lobular emphysema. Mild atelectatic changes are seen within the posterior aspects of the bilateral lung bases, right greater than left. There is no evidence of a pleural effusion or pneumothorax. Upper Abdomen: A 1.8 cm x 1.1 cm focus of parenchymal low attenuation is seen within the posterior aspect of the left lobe of the liver. Musculoskeletal: Compression fracture deformities of indeterminate age are seen involving the T6, T7, T8 and T9 vertebral bodies. This is most severe at the level of T7, which represents a new finding when compared to the prior study. Evidence of prior vertebroplasty is noted at the levels of T5, T11, T12, L1 and L2. Review of the MIP images confirms the above findings. IMPRESSION: 1. No  evidence of pulmonary embolus. 2. Mild central lobular emphysema. 3. Compression fracture deformities of indeterminate age involving the T6, T7, T8 and T9 vertebral bodies, most severe at the level of T7. 4. Evidence of prior vertebroplasty at the levels of T5, T11, T12, L1 and L2. Aortic Atherosclerosis (ICD10-I70.0) and Emphysema (ICD10-J43.9). Electronically Signed   By: Virgina Norfolk M.D.   On: 03/18/2021 04:00   US Venous Img Lower Bilateral (DVT)  Result Date: 03/19/2021 CLINICAL DATA:  Shortness of breath, leg pain EXAM: BILATERAL LOWER EXTREMITY VENOUS DOPPLER ULTRASOUND TECHNIQUE: Gray-scale sonography with compression, as well as color and duplex ultrasound, were performed to evaluate the deep venous system(s) from the level of the common femoral vein through the popliteal and proximal calf veins. COMPARISON:  None. FINDINGS: VENOUS Normal compressibility of BILATERAL common femoral, superficial femoral, and popliteal veins, as well as the visualized calf veins. Visualized portions of BILATERAL profunda femoral vein and great saphenous vein unremarkable. No filling defects to suggest DVT on grayscale or color Doppler imaging. Doppler waveforms show normal direction of venous flow, normal respiratory plasticity and response to augmentation. OTHER None. Limitations: none IMPRESSION: No evidence of deep venous thrombosis in either lower extremity. Electronically Signed   By: Lavonia Dana M.D.   On: 03/19/2021 09:59   DG Chest Port 1 View  Result Date: 03/24/2021 CLINICAL DATA:  Difficulty breathing EXAM: PORTABLE CHEST 1 VIEW COMPARISON:  Previous studies including the examination of 03/21/2021 FINDINGS: Transverse diameter of heart is slightly increased. Thoracic aorta is tortuous and ectatic. There are small linear densities in the lower lung fields, more so on the left side with no significant interval change. There is no focal pulmonary consolidation. There is no significant pleural effusion or  pneumothorax. IMPRESSION: Linear densities in the lower lung fields have not changed significantly. There are no new focal infiltrates or signs of pulmonary edema. Electronically Signed   By: Elmer Picker M.D.   On: 03/24/2021 09:50   DG Chest Port 1 View  Result Date: 03/21/2021 CLINICAL DATA:  Shortness of breath EXAM: PORTABLE CHEST 1 VIEW COMPARISON:  Previous studies including the examination of 03/17/2021 FINDINGS: Cardiac size is unremarkable. Thoracic aorta is tortuous and ectatic. There are no signs of pulmonary  edema. There is crowding of markings in the lower lung fields with interval improvement. No new focal infiltrates are seen. There is no pleural effusion or pneumothorax. There is evidence of vertebroplasty in the multiple thoracic vertebral bodies IMPRESSION: There is interval improvement in aeration of lower lung fields suggesting resolving subsegmental atelectasis. There are no new infiltrates or signs of pulmonary edema. Electronically Signed   By: Elmer Picker M.D.   On: 03/21/2021 12:06   ECHOCARDIOGRAM COMPLETE  Result Date: 03/20/2021    ECHOCARDIOGRAM REPORT   Patient Name:   Ashley Mack Date of Exam: 03/20/2021 Medical Rec #:  [id number]     Height:       62.0 in Accession #:    [id number]    Weight:       139.0 lb Date of Birth:  [birth year]     BSA:          1.638 m Patient Age:    6 years      BP:           128/78 mmHg Patient Gender: F             HR:           112 bpm. Exam Location:  [facility] Procedure: 2D Echo, Color Doppler and Cardiac Doppler Indications:     R06.00 Dyspnea  History:         Patient has no prior history of Echocardiogram examinations.                  COPD, Signs/Symptoms:Shortness of Breath; Risk                  Factors:Hypertension. Positive for influenza type A.  Sonographer:     Charmayne Sheer Referring Phys:  [id number] Barb Merino Diagnosing Phys: Isaias Cowman MD  Sonographer Comments: Suboptimal parasternal window and suboptimal subcostal  window. Image acquisition challenging due to COPD. IMPRESSIONS  1. Left ventricular ejection fraction, by estimation, is 55 to 60%. The left ventricle has normal function. The left ventricle has no regional wall motion abnormalities. Left ventricular diastolic parameters are consistent with Grade I diastolic dysfunction (impaired relaxation).  2. Right ventricular systolic function is normal. The right ventricular size is normal.  3. The mitral valve is normal in structure. Mild mitral valve regurgitation. No evidence of mitral stenosis.  4. The aortic valve is normal in structure. Aortic valve regurgitation is not visualized. No aortic stenosis is present.  5. The inferior vena cava is normal in size with greater than 50% respiratory variability, suggesting right atrial pressure of 3 mmHg. FINDINGS  Left Ventricle: Left ventricular ejection fraction, by estimation, is 55 to 60%. The left ventricle has normal function. The left ventricle has no regional wall motion abnormalities. The left ventricular internal cavity size was normal in size. There is  no left ventricular hypertrophy. Left ventricular diastolic parameters are consistent with Grade I diastolic dysfunction (impaired relaxation). Right Ventricle: The right ventricular size is normal. No increase in right ventricular wall thickness. Right ventricular systolic function is normal. Left Atrium: Left atrial size was normal in size. Right Atrium: Right atrial size was normal in size. Pericardium: There is no evidence of pericardial effusion. Mitral Valve: The mitral valve is normal in structure. Mild mitral valve regurgitation. No evidence of mitral valve stenosis. MV peak gradient, 5.3 mmHg. The mean mitral valve gradient is 3.0 mmHg. Tricuspid Valve: The tricuspid valve is normal in structure. Tricuspid valve regurgitation is mild .  No evidence of tricuspid stenosis. Aortic Valve: The aortic valve is normal in structure. Aortic valve regurgitation is not  visualized. No aortic stenosis is present. Aortic valve mean gradient measures 4.0 mmHg. Aortic valve peak gradient measures 7.2 mmHg. Aortic valve area, by VTI measures 3.56 cm. Pulmonic Valve: The pulmonic valve was normal in structure. Pulmonic valve regurgitation is not visualized. No evidence of pulmonic stenosis. Aorta: The aortic root is normal in size and structure. Venous: The inferior vena cava is normal in size with greater than 50% respiratory variability, suggesting right atrial pressure of 3 mmHg. IAS/Shunts: No atrial level shunt detected by color flow Doppler.  LEFT VENTRICLE PLAX 2D LVOT diam:     2.20 cm     Diastology LV SV:         84          LV e' medial:    6.42 cm/s LV SV Index:   52          LV E/e' medial:  16.5 LVOT Area:     3.80 cm    LV e' lateral:   7.07 cm/s                            LV E/e' lateral: 15.0  LV Volumes (MOD) LV vol d, MOD A2C: 48.6 ml LV vol d, MOD A4C: 62.2 ml LV vol s, MOD A2C: 24.0 ml LV vol s, MOD A4C: 32.5 ml LV SV MOD A2C:     24.6 ml LV SV MOD A4C:     62.2 ml LV SV MOD BP:      28.9 ml RIGHT VENTRICLE RV Basal diam:  3.16 cm LEFT ATRIUM             Index        RIGHT ATRIUM           Index LA Vol (A2C):   26.6 ml 16.24 ml/m  RA Area:     10.30 cm LA Vol (A4C):   23.0 ml 14.04 ml/m  RA Volume:   21.80 ml  13.31 ml/m LA Biplane Vol: 26.3 ml 16.06 ml/m  AORTIC VALVE                    PULMONIC VALVE AV Area (Vmax):    3.49 cm     PV Vmax:       1.20 m/s AV Area (Vmean):   3.31 cm     PV Vmean:      83.500 cm/s AV Area (VTI):     3.56 cm     PV VTI:        0.205 m AV Vmax:           134.00 cm/s  PV Peak grad:  5.8 mmHg AV Vmean:          98.100 cm/s  PV Mean grad:  3.0 mmHg AV VTI:            0.237 m AV Peak Grad:      7.2 mmHg AV Mean Grad:      4.0 mmHg LVOT Vmax:         123.00 cm/s LVOT Vmean:        85.500 cm/s LVOT VTI:          0.222 m LVOT/AV VTI ratio: 0.94  AORTA Ao Root diam: 2.80 cm MITRAL VALVE MV Area (PHT): 7.09 cm     SHUNTS  MV Area VTI:    5.62 cm     Systemic VTI:  0.22 m MV Peak grad:  5.3 mmHg     Systemic Diam: 2.20 cm MV Mean grad:  3.0 mmHg MV Vmax:       1.15 m/s MV Vmean:      77.5 cm/s MV Decel Time: 107 msec MV E velocity: 106.00 cm/s MV A velocity: 113.00 cm/s MV E/A ratio:  0.94 Isaias Cowman MD Electronically signed by Isaias Cowman MD Signature Date/Time: 03/20/2021/1:43:48 PM    Final       ASSESSMENT/PLAN   Acute hypoxemic respiratory failure   Due to concomitant influenza A infection with COPD exacerbation and atelectasis with mild effusion of right pleural space.      - patient would benefit from pulmonary optimization  -c/w zithromax/rocepin  Influenza A infetion   - Tamiflu   - stopped Tessalon to allow expectoration    - Dcd Tussinex hinders respiratory effort   - dcd pulmicort - patient on IV steroids                03/24/21-dcd steroids   Mild pulmonary edema  - lasix 20 IV bid   Acute on Chronic COPD    - continue Duoneb    - patient has allergy to Trelegy and Anoro    - continue steriods - reduced solumedrol to 40 once daily IV>>20>>dcd     - IS and flutter are at bedside   Bibasilar atelectasis    Patient would benefit from aggressive recruitment maneuvers -PT/OT should be daily    Thank you for allowing me to participate in the care of this patient.  Total face to face encounter time for this patient visit was >45 min. >50% of the time was  spent in counseling and coordination of care.   Patient/Family are satisfied with care plan and all questions have been answered.  This document was prepared using Dragon voice recognition software and may include unintentional dictation errors.     Ottie Glazier, M.D.  Division of [place]

## 2021-03-25 NOTE — Plan of Care (Signed)

## 2021-03-25 NOTE — Consult Note (Signed)
Consultation Note Date: 03/25/2021   Patient Name: Ashley Mack  DOB: [birth year]  MRN: [id number]  Age / Sex: 76 y.o., female  PCP: Kirk Ruths, MD Referring Physician: Barb Merino, MD  Reason for Consultation: Establishing goals of care  HPI/Patient Profile: 76 y.o. female  with past medical history of RA, COPD, GERD< anxiety, HTN, and migraines who is legally blind admitted on 03/18/2021 with worsening dyspna with congestive cough and wheezing X 1 week. Pt influenza A positive, and Covid negative. No evidence of PE. Pt working with PT and currently on 4L [place] without respiratory distress.    Clinical Assessment and Goals of Care: I have reviewed medical records including EPIC notes, labs and imaging, assessed the patient and then met with patient at bedside  to discuss diagnosis prognosis, GOC, EOL wishes, disposition and options.  I introduced Palliative Medicine as specialized medical care for people living with serious illness. It focuses on providing relief from the symptoms and stress of a serious illness. The goal is to improve quality of life for both the patient and the family.  We discussed a brief life review of the patient. 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 She reports she stays very active and on the go. Her husband passed from mesothelioma a few years ago and she reports she has tried to stay on the go ever since.   As far as functional and nutritional status pt is independent with all ADLs. She reports to issues with completing ADLs and can "take care of myself".   We discussed patient's current illness and what it means in the larger context of patient's on-going co-morbidities. Pt confirmed understanding of COPD and an exacerbation with teachback method.   I attempted to elicit values and goals of care important to the patient. She wants to get  home "and get back to doing what I was doing before all of this happened".     Advance directives and concepts specific to code status, were considered and discussed. Patient shares she has a living will which she completed with her husband years ago that states she would like to allow a natural death. She does not "ever" want to be "hooked up to machines". I confirmed with patient twice that in the event of a cardiopulmonary arrest that pt would not want to be resuscitated or intubated and would want a natural death to occur.   Education offered regarding concept specific to human mortality and the limitations of medical interventions to prolong life when the body has a chronic and progressive disease like COPD.   Discussed with patient/family the importance of continued conversation with family and the medical providers regarding overall plan of care and treatment options, ensuring decisions are within the context of the patients values and GOCs.    Palliative Care services outpatient were explained and offered. Pt declined these services at this time. She says she has a lot of support from her daughter and her husband moving  in with her as well as neighbors who work in the Physicist, medical.   Questions and concerns were addressed. The family was encouraged to call with questions or concerns.   Primary Decision Maker PATIENT  Code Status/Advance Care Planning: Code status changed to DNR  Prognosis:   Unable to determine  Discharge Planning: Home - pt decline outpatient palliative to follow at discharge  Primary Diagnoses: Present on Admission:  COPD exacerbation ([place])  COPD with acute exacerbation ([place])   Physical Exam Vitals reviewed.  Constitutional:      General: She is not in acute distress.    Appearance: She is not ill-appearing.  HENT:     Head: Normocephalic and atraumatic.  Cardiovascular:     Rate and Rhythm: Normal rate.  Pulmonary:     Effort: Pulmonary effort is  normal.     Breath sounds: Examination of the right-lower field reveals decreased breath sounds. Examination of the left-lower field reveals decreased breath sounds. Decreased breath sounds present.     Comments: 4L [place] Abdominal:     Palpations: Abdomen is soft.  Musculoskeletal:        General: Normal range of motion.     Cervical back: Normal range of motion.  Skin:    General: Skin is warm.  Neurological:     Mental Status: She is alert and oriented to person, place, and time.  Psychiatric:        Mood and Affect: Mood normal. Mood is not anxious.        Behavior: Behavior normal. Behavior is not agitated.    Vital Signs: BP 117/75 (BP Location: Left Arm)    Pulse (!) 115    Temp 98.5 F (36.9 C) (Oral)    Resp 18    Ht $R'5\' 2"'iP$  (1.575 m)    Wt 63 kg    SpO2 97%    BMI 25.42 kg/m  Pain Scale: 0-10   Pain Score: 4  SpO2: SpO2: 97 % O2 Device:SpO2: 97 % O2 Flow Rate: .O2 Flow Rate (L/min): 4 L/min  Palliative Assessment/Data: 60%     I discussed this patient's plan of care with Dr. Sloan Leiter, Dr. Lanney Gins, nursing, patient.  Thank you for this consult. Palliative medicine will continue to follow and assist holistically.   Time Total: 70 minutes Greater than 50%  of this time was spent counseling and coordinating care related to the above assessment and plan.  Signed by: Jordan Hawks, DNP, FNP-BC Palliative Medicine    Please contact Palliative Medicine Team phone at [phone] for questions and concerns.  For individual provider: See Shea Evans

## 2021-03-25 NOTE — Progress Notes (Signed)
Nutrition Brief Note  Patient identified on the Malnutrition Screening Tool (MST) Report  76 y/o female with h/o COPD, RA, anxiety and GERD who is admitted with COPD exacerbation.   Met with pt in room today. Pt reports good appetite and oral intake at baseline and in hospital. Pt's main complaint today is that she does not like the hospital food. Pt is having food brought from outside of the hospital. Pt reports eating a biscuit for breakfast this morning and reports she has family bringing her a hamburger for lunch. Pt reports that she does not like supplements and that she can't even take the smell of them. Per chart, pt appears weight stable at baseline.   Wt Readings from Last 15 Encounters:  03/19/21 63 kg  03/11/21 60.6 kg  02/27/21 60.6 kg  11/14/20 59.3 kg  09/15/20 62.9 kg  08/18/20 62.9 kg  08/15/20 62.9 kg  02/15/20 55.8 kg  11/06/19 55.3 kg  10/26/19 56.2 kg  10/19/19 56.2 kg  10/04/19 60.3 kg  09/16/19 60.3 kg  08/03/19 62.8 kg  06/17/19 68.8 kg    Body mass index is 25.42 kg/m. Patient meets criteria for overweight based on current BMI.   Current diet order is heart healthy, patient is consuming approximately 100% of meals at this time. Labs and medications reviewed.   Pt declines nutritional interventions today. RD will liberalize pt's diet as pt is eating food brought from outside of the hospital.   No further nutrition interventions warranted at this time. If nutrition issues arise, please consult RD.   Koleen Distance MS, RD, LDN Please refer to [facility] for RD and/or RD on-call/weekend/after hours pager

## 2021-03-25 NOTE — Progress Notes (Signed)
PROGRESS NOTE    Ashley Mack  GYI:[id number] DOB: [birth year] DOA: 03/18/2021 PCP: Kirk Ruths, MD    Brief Narrative:  76 year old with hypertension, anxiety, visually impaired, GERD, migraine and COPD not on home oxygen presented with acute onset of worsening dyspnea with associated congestive productive cough and wheezing for 1 week.  Recently treated with Levaquin and prednisone taper.  In the emergency room blood pressure stable.  Heart rate 110.  95% on 2 L oxygen.  Subsequently required high flow nasal cannula oxygen.  Influenza A positive.  COVID-19 negative.  D-dimer was 6.44.  CTA with no evidence of PE.  Showed emphysema and compression fractures. Patient remains in the hospital due to persistent symptoms and shortness of breath not resolving.  Followed by pulmonary.  Assessment & Plan:   Principal Problem:   COPD exacerbation ([place]) Active Problems:   COPD with acute exacerbation ([place])  COPD with acute exacerbation due to acute influenza A infection: Persistent symptoms despite optimizing treatment. Repeat x-ray stable. Was on aggressive steroids, tapered off and stopped. Continue aggressive bronchodilator therapy, intermittent diuresis and chest physiotherapy. Completed 5 days of IV Rocephin.  Completed Tamiflu for 5 days. CTA with no evidence of pulmonary embolism, no evidence of pneumonia. 2D echocardiogram essentially normal. Lower extremity duplex is negative for DVT. Due to significant persistent findings, followed by pulmonary. To help coordinate and counsel given advanced COPD, palliative also consulted.  Yet to see. Patient will need portable oxygen with generator as she is visually impaired and very high risk of tripping on the oxygen pipe.  Leukocytosis: Due to high-dose steroid use.  No evidence of bacterial infection.  Will monitor.    Essential hypertension, not on treatment.  Hydralazine as needed.  Started on amlodipine that patient did not  tolerate with flushing and palpitation.  Discontinued.    DVT prophylaxis: enoxaparin (LOVENOX) injection 40 mg Start: 03/18/21 1000   Code Status: Full code Family Communication: None Disposition Plan: Status is: Inpatient  Remains inpatient appropriate because: Still on 6 L oxygen.  Significant tachypnea and tachycardia with mobility.  Consultants:  Pulmonary  Procedures:  None  Antimicrobials:  Rocephin 1/2--- 1/6 Tamiflu 1/3--1/7   Subjective:  Patient seen and examined.  No overnight events.  Has not been mobilized today.  At rest, she feels okay today. She is asking me "what is the game plan and what else can be done".  I explained to her that we are trying to control over her symptoms and once she stops getting episodes of breathlessness she can go home with oxygen and patient is satisfied.  Objective: Vitals:   03/24/21 2006 03/24/21 2102 03/25/21 0315 03/25/21 0750  BP:  123/74 128/80 117/75  Pulse:  (!) 121 (!) 117 (!) 115  Resp:  20 20 18   Temp:  98.6 F (37 C) 97.9 F (36.6 C) 98.5 F (36.9 C)  TempSrc:  Oral Oral Oral  SpO2: 99% 98% 96% 97%  Weight:      Height:        Intake/Output Summary (Last 24 hours) at 03/25/2021 1102 Last data filed at 03/25/2021 1034 Gross per 24 hour  Intake --  Output 2 ml  Net -2 ml    Filed Weights   03/17/21 1444 03/19/21 2148  Weight: 63.5 kg 63 kg    Examination:  General: Patient is chronically sick looking but not in any distress at rest. Gets easily tachypneic on mobility and getting out of the bed. SpO2: 97 %  O2 Flow Rate (L/min): 4 L/min FiO2 (%): 30 %  Cardiovascular: S1-S2 normal.  Regular rate rhythm.  Tachycardic. Respiratory: Poor bilateral air entry.  Expiratory wheezes present. Gastrointestinal: Soft.  Nontender.  Bowel sound present. Ext: Chronic nonpitting edema.  Paresthesia and tender legs. Neuro: Alert oriented x4.  She is visually impaired.  Equal strength in all  extremities. Musculoskeletal: No joint deformities.     Data Reviewed: I have personally reviewed following labs and imaging studies  CBC: Recent Labs  Lab 03/20/21 0551 03/22/21 0310 03/24/21 0913  WBC 21.9* 20.6* 19.0*  NEUTROABS 20.5* 19.0* 16.0*  HGB 10.1* 11.5* 12.1  HCT 30.9* 35.8* 37.8  MCV 85.4 84.8 85.5  PLT 364 414* 209*   Basic Metabolic Panel: Recent Labs  Lab 03/20/21 0551 03/24/21 0913  NA 137 134*  K 4.5 3.6  CL 101 94*  CO2 29 34*  GLUCOSE 155* 118*  BUN 27* 30*  CREATININE 0.85 0.64  CALCIUM 8.4* 8.4*  MG 2.4  --   PHOS 3.3  --    GFR: Estimated Creatinine Clearance: 51.4 mL/min (by C-G formula based on SCr of 0.64 mg/dL). Liver Function Tests: Recent Labs  Lab 03/24/21 0913  AST 18  ALT 21  ALKPHOS 64  BILITOT 0.5  PROT 6.0*  ALBUMIN 3.0*   No results for input(s): LIPASE, AMYLASE in the last 168 hours. No results for input(s): AMMONIA in the last 168 hours. Coagulation Profile: No results for input(s): INR, PROTIME in the last 168 hours. Cardiac Enzymes: No results for input(s): CKTOTAL, CKMB, CKMBINDEX, TROPONINI in the last 168 hours. BNP (last 3 results) No results for input(s): PROBNP in the last 8760 hours. HbA1C: No results for input(s): HGBA1C in the last 72 hours. CBG: No results for input(s): GLUCAP in the last 168 hours. Lipid Profile: No results for input(s): CHOL, HDL, LDLCALC, TRIG, CHOLHDL, LDLDIRECT in the last 72 hours. Thyroid Function Tests: No results for input(s): TSH, T4TOTAL, FREET4, T3FREE, THYROIDAB in the last 72 hours. Anemia Panel: No results for input(s): VITAMINB12, FOLATE, FERRITIN, TIBC, IRON, RETICCTPCT in the last 72 hours. Sepsis Labs: No results for input(s): PROCALCITON, LATICACIDVEN in the last 168 hours.  Recent Results (from the past 240 hour(s))  Resp Panel by RT-PCR (Flu A&B, Covid) Nasopharyngeal Swab     Status: Abnormal   Collection Time: 03/17/21  2:55 PM   Specimen:  Nasopharyngeal Swab; Nasopharyngeal(NP) swabs in vial transport medium  Result Value Ref Range Status   SARS Coronavirus 2 by RT PCR NEGATIVE NEGATIVE Final    Comment: (NOTE) SARS-CoV-2 target nucleic acids are NOT DETECTED.  The SARS-CoV-2 RNA is generally detectable in upper respiratory specimens during the acute phase of infection. The lowest concentration of SARS-CoV-2 viral copies this assay can detect is 138 copies/mL. A negative result does not preclude SARS-Cov-2 infection and should not be used as the sole basis for treatment or other patient management decisions. A negative result may occur with  improper specimen collection/handling, submission of specimen other than nasopharyngeal swab, presence of viral mutation(s) within the areas targeted by this assay, and inadequate number of viral copies(<138 copies/mL). A negative result must be combined with clinical observations, patient history, and epidemiological information. The expected result is Negative.  Fact Sheet for Patients:  EntrepreneurPulse.com.au  Fact Sheet for Healthcare Providers:  IncredibleEmployment.be  This test is no t yet approved or cleared by the [place] FDA and  has been authorized for detection and/or diagnosis of SARS-CoV-2 by FDA  under an Emergency Use Authorization (EUA). This EUA will remain  in effect (meaning this test can be used) for the duration of the COVID-19 declaration under Section 564(b)(1) of the Act, 21 U.S.C.section 360bbb-3(b)(1), unless the authorization is terminated  or revoked sooner.       Influenza A by PCR POSITIVE (A) NEGATIVE Final   Influenza B by PCR NEGATIVE NEGATIVE Final    Comment: (NOTE) The Xpert Xpress SARS-CoV-2/FLU/RSV plus assay is intended as an aid in the diagnosis of influenza from Nasopharyngeal swab specimens and should not be used as a sole basis for treatment. Nasal washings and aspirates are unacceptable  for Xpert Xpress SARS-CoV-2/FLU/RSV testing.  Fact Sheet for Patients: EntrepreneurPulse.com.au  Fact Sheet for Healthcare Providers: IncredibleEmployment.be  This test is not yet approved or cleared by the [place] FDA and has been authorized for detection and/or diagnosis of SARS-CoV-2 by FDA under an Emergency Use Authorization (EUA). This EUA will remain in effect (meaning this test can be used) for the duration of the COVID-19 declaration under Section 564(b)(1) of the Act, 21 U.S.C. section 360bbb-3(b)(1), unless the authorization is terminated or revoked.  Performed at [facility], [address], [place],  [address]          Radiology Studies: DG Chest [place] 1 View  Result Date: 03/24/2021 CLINICAL DATA:  Difficulty breathing EXAM: PORTABLE CHEST 1 VIEW COMPARISON:  Previous studies including the examination of 03/21/2021 FINDINGS: Transverse diameter of heart is slightly increased. Thoracic aorta is tortuous and ectatic. There are small linear densities in the lower lung fields, more so on the left side with no significant interval change. There is no focal pulmonary consolidation. There is no significant pleural effusion or pneumothorax. IMPRESSION: Linear densities in the lower lung fields have not changed significantly. There are no new focal infiltrates or signs of pulmonary edema. Electronically Signed   By: Elmer Picker M.D.   On: 03/24/2021 09:50        Scheduled Meds:  cholecalciferol  2,000 Units Oral Daily   [START ON 03/31/2021] cyanocobalamin  1,000 mcg Intramuscular Q30 days   enoxaparin (LOVENOX) injection  40 mg Subcutaneous Daily   furosemide  20 mg Intravenous BID   guaiFENesin  600 mg Oral BID   ipratropium-albuterol  3 mL Nebulization TID   multivitamin with minerals  1 tablet Oral Daily   pantoprazole  40 mg Oral Daily   Continuous Infusions:     LOS: 7 days    Time spent: 35  minutes    Barb Merino, MD [facility] Pager [phone]

## 2021-03-26 DIAGNOSIS — J810 Acute pulmonary edema: Secondary | ICD-10-CM

## 2021-03-26 LAB — BASIC METABOLIC PANEL
Anion gap: 9 (ref 5–15)
BUN: 35 mg/dL — ABNORMAL HIGH (ref 8–23)
CO2: 34 mmol/L — ABNORMAL HIGH (ref 22–32)
Calcium: 8.4 mg/dL — ABNORMAL LOW (ref 8.9–10.3)
Chloride: 87 mmol/L — ABNORMAL LOW (ref 98–111)
Creatinine, Ser: 1.18 mg/dL — ABNORMAL HIGH (ref 0.44–1.00)
GFR, Estimated: 48 mL/min — ABNORMAL LOW (ref >60)
Glucose, Bld: 141 mg/dL — ABNORMAL HIGH (ref 70–99)
Potassium: 3.1 mmol/L — ABNORMAL LOW (ref 3.5–5.1)
Sodium: 130 mmol/L — ABNORMAL LOW (ref 135–145)

## 2021-03-26 LAB — CBC
HCT: 36.4 % (ref 36.0–46.0)
Hemoglobin: 12 g/dL (ref 12.0–15.0)
MCH: 27.6 pg (ref 26.0–34.0)
MCHC: 33 g/dL (ref 30.0–36.0)
MCV: 83.9 fL (ref 80.0–100.0)
Platelets: 344 10*3/uL (ref 150–400)
RBC: 4.34 MIL/uL (ref 3.87–5.11)
RDW: 16.1 % — ABNORMAL HIGH (ref 11.5–15.5)
WBC: 22.9 10*3/uL — ABNORMAL HIGH (ref 4.0–10.5)
nRBC: 0 % (ref 0.0–0.2)

## 2021-03-26 NOTE — Progress Notes (Signed)
°   03/26/21 0935  Assess: MEWS Score  Temp 99.2 F (37.3 C)  BP 122/74  Pulse Rate (!) 113  Resp 20  SpO2 96 %  O2 Device Nasal Cannula  O2 Flow Rate (L/min) 4.5 L/min  Assess: MEWS Score  MEWS Temp 0  MEWS Systolic 0  MEWS Pulse 2  MEWS RR 0  MEWS LOC 0  MEWS Score 2  MEWS Score Color Yellow  Assess: if the MEWS score is Yellow or Red  Were vital signs taken at a resting state? Yes  Focused Assessment No change from prior assessment  Does the patient meet 2 or more of the SIRS criteria? No  Does the patient have a confirmed or suspected source of infection? Yes  Provider and Rapid Response Notified? No  MEWS guidelines implemented *See Row Information* No, previously yellow, continue vital signs every 4 hours  Treat  Pain Scale 0-10  Pain Score 5  Pain Type Acute pain  Pain Location Rib cage  Pain Orientation Right  Pain Frequency Intermittent  Pain Onset On-going  Pain Intervention(s) Medication (See eMAR)  Complains of Shortness of breath  Interventions Other (comment) (increased oxygen)  Patients response to intervention Effective  Assess: SIRS CRITERIA  SIRS Temperature  0  SIRS Pulse 1  SIRS Respirations  0  SIRS WBC 0  SIRS Score Sum  1   HR remains elevated, MD aware. Patient denies any other concerns or symptoms.

## 2021-03-26 NOTE — Progress Notes (Signed)
°   03/26/21 1632  Assess: MEWS Score  BP 137/79  Pulse Rate (!) 113  Resp 20  SpO2 94 %  O2 Device Nasal Cannula  O2 Flow Rate (L/min) 4 L/min  Assess: MEWS Score  MEWS Temp 0  MEWS Systolic 0  MEWS Pulse 2  MEWS RR 0  MEWS LOC 0  MEWS Score 2  MEWS Score Color Yellow  Assess: if the MEWS score is Yellow or Red  Were vital signs taken at a resting state? Yes  Focused Assessment No change from prior assessment  Does the patient meet 2 or more of the SIRS criteria? No  Does the patient have a confirmed or suspected source of infection? No  Provider and Rapid Response Notified? No  MEWS guidelines implemented *See Row Information* No, previously yellow, continue vital signs every 4 hours  Assess: SIRS CRITERIA  SIRS Temperature  0  SIRS Pulse 1  SIRS Respirations  0  SIRS WBC 0  SIRS Score Sum  1   HR comtinues to be elevated, no other issues noted.

## 2021-03-26 NOTE — Progress Notes (Signed)
Occupational Therapy Treatment Patient Details Name: CHANTILLY LINSKEY MRN: [id number] DOB: 1945-09-04 Today's Date: 03/26/2021   History of present illness Pt is a 76 y.o. female with medical history significant for hypertension, anxiety, GERD, migraine, rheumatoid arthritis, fatty liver and COPD, who presented to emergency room with acute onset of worsening dyspnea with associated congested productive cough and wheezing for the last week. Workup +flu.   OT comments  Pt seen for OT treatment on this date. Upon arrival to room, pt awake and seated upright in bed. Pt agreeable to tx. Pt currently requires SUPERVISION for bed mobility, SUPERVISION/SET-UP for seated UB dressing, and MIN GUARD for toilet transfers/hygiene d/t decreased activity tolerance and strength. While seated EOB, pt requesting to participated in seated UE therex, consisting of shoulder flexion & scapula retraction. Pt is making good progress toward goals and continues to benefit from skilled OT services to maximize return to PLOF and minimize risk of future falls, injury, caregiver burden, and readmission. Will continue to follow POC. Discharge recommendation remains appropriate.     Recommendations for follow up therapy are one component of a multi-disciplinary discharge planning process, led by the attending physician.  Recommendations may be updated based on patient status, additional functional criteria and insurance authorization.    Follow Up Recommendations  Home health OT    Assistance Recommended at Discharge Frequent or constant Supervision/Assistance  Patient can return home with the following  A little help with walking and/or transfers;A little help with bathing/dressing/bathroom;Assistance with cooking/housework;Help with stairs or ramp for entrance   Equipment Recommendations  BSC/3in1       Precautions / Restrictions Precautions Precautions: Fall Restrictions Weight Bearing Restrictions: No       Mobility  Bed Mobility Overal bed mobility: Needs Assistance Bed Mobility: Supine to Sit;Sit to Supine     Supine to sit: Supervision;HOB elevated Sit to supine: Supervision;HOB elevated        Transfers Overall transfer level: Needs assistance Equipment used: Rolling walker (2 wheels) Transfers: Sit to/from Stand;Bed to chair/wheelchair/BSC Sit to Stand: Min guard Stand pivot transfers: Min guard         General transfer comment: Requires verbal cues for safe hand placement with RW use     Balance Overall balance assessment: Needs assistance Sitting-balance support: No upper extremity supported;Feet supported Sitting balance-Leahy Scale: Good Sitting balance - Comments: good sitting balance reaching within BOS at EOB   Standing balance support: Bilateral upper extremity supported;During functional activity Standing balance-Leahy Scale: Fair Standing balance comment: With UE supported by RW, requires only MIN GUARD for stand pivot transfers                           ADL either performed or assessed with clinical judgement   ADL Overall ADL's : Needs assistance/impaired                 Upper Body Dressing : Set up;Sitting Upper Body Dressing Details (indicate cue type and reason): to don/doff hospital gown     Toilet Transfer: Min guard;Stand-pivot;BSC/3in1   Toileting- Clothing Manipulation and Hygiene: Supervision/safety;Set up;Sitting/lateral lean               Cognition Arousal/Alertness: Awake/alert Behavior During Therapy: WFL for tasks assessed/performed Overall Cognitive Status: Within Functional Limits for tasks assessed  Exercises General Exercises - Upper Extremity Shoulder Flexion: AROM;Strengthening;Both;10 reps;Seated           Pertinent Vitals/ Pain       Pain Assessment: Faces Faces Pain Scale: Hurts even more Pain Location: back Pain Descriptors / Indicators:  Grimacing;Moaning;Discomfort Pain Intervention(s): Limited activity within patient's tolerance;Monitored during session;Repositioned         Frequency  Min 2X/week        Progress Toward Goals  OT Goals(current goals can now be found in the care plan section)  Progress towards OT goals: Progressing toward goals  Acute Rehab OT Goals Patient Stated Goal: to go home OT Goal Formulation: With patient/family Time For Goal Achievement: 04/03/21 Potential to Achieve Goals: Good  Plan Discharge plan remains appropriate;Frequency remains appropriate       AM-PAC OT "6 Clicks" Daily Activity     Outcome Measure   Help from another person eating meals?: None Help from another person taking care of personal grooming?: A Little Help from another person toileting, which includes using toliet, bedpan, or urinal?: A Little Help from another person bathing (including washing, rinsing, drying)?: A Little Help from another person to put on and taking off regular upper body clothing?: None Help from another person to put on and taking off regular lower body clothing?: A Little 6 Click Score: 20    End of Session Equipment Utilized During Treatment: Oxygen;Rolling walker (2 wheels)  OT Visit Diagnosis: Other abnormalities of gait and mobility (R26.89)   Activity Tolerance Patient tolerated treatment well   Patient Left in bed;with call bell/phone within reach;with bed alarm set   Nurse Communication Mobility status        Time: 9450-3888 OT Time Calculation (min): 28 min  Charges: OT General Charges $OT Visit: 1 Visit OT Treatments $Self Care/Home Management : 23-37 mins  Fredirick Maudlin, OTR/L [place]

## 2021-03-26 NOTE — Progress Notes (Signed)
Lab reported CMP vial unacceptable and states will place in for recollect.

## 2021-03-26 NOTE — Care Management Important Message (Signed)
Important Message  Patient Details  Name: MCKINSEY KEAGLE MRN: [id number] Date of Birth: 13-May-1945   Medicare Important Message Given:  Yes     Dannette Barbara 03/26/2021, 11:09 AM

## 2021-03-26 NOTE — Progress Notes (Signed)
PULMONOLOGY         Date: 03/26/2021,   MRN# [id number] GRACIANNA VINK 1945/10/10     AdmissionWeight: 63.5 kg                 CurrentWeight: 84 kg   Referring physician: Dr Sloan Leiter   CHIEF COMPLAINT:   Increased O2 requirement   HISTORY OF PRESENT ILLNESS   This is a 76 yo F with hx of anemia, anxiety, OA, RA, retinopathy, NASH, CKD, chronic migraines, osteoporosis, emphysema who came in with worsening SOB/DOE.  Seh was seen at urgent care and post treatment continued to deteriorate.  Influenza A came back positive and influenza antigen was negative.  COVID-19 PCR came back negative.  D-dimer was significantly elevated at 6.44.After 5d treatment patient has not reached improvement substantially and requires incresead O2 with PCCM consultation for further evaluation and management.    03/23/21- patient with increased O2 req, I have reduced steroids and increased diuretic. Continue current regimen.  03/24/21- patient feels improved s/p gentle diuresis , repeat CXR inprocess, CMP  and CBCwdiff and CRP today.  PT/OT today. Steroids stopped.    03/25/21- patient slowly improved, O2 weaned down from 6 to 4L/min continue gentle diuresis and PT.   03/26/21- patient remains on 4L.  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H[facility])    Iron (Fe) deficiency anemia    Migraine headache    in past   Migraines    Motion sickness    No natural teeth    Osteoporosis    Rheumatoid arthritis ([place])    Vertigo      SURGICAL HISTORY   Past Surgical History:  Procedure Laterality Date   CATARACT EXTRACTION W/PHACO  Right 08/12/2016   Procedure: CATARACT EXTRACTION PHACO AND INTRAOCULAR LENS PLACEMENT ([place])  Right;  Surgeon: Leandrew Koyanagi, MD;  Location: [place];  Service: Ophthalmology;  Laterality: Right;   CATARACT EXTRACTION W/PHACO Left 09/30/2016   Procedure: CATARACT EXTRACTION PHACO AND INTRAOCULAR LENS PLACEMENT ([place]) Left;  Surgeon: Leandrew Koyanagi, MD;  Location: [place];  Service: Ophthalmology;  Laterality: Left;   COLONOSCOPY     COLONOSCOPY WITH PROPOFOL N/A 12/09/2016   Procedure: COLONOSCOPY WITH PROPOFOL;  Surgeon: Toledo, Benay Pike, MD;  Location: [facility];  Service: Endoscopy;  Laterality: N/A;   ESOPHAGOGASTRODUODENOSCOPY     ESOPHAGOGASTRODUODENOSCOPY (EGD) WITH PROPOFOL N/A 12/09/2016   Procedure: ESOPHAGOGASTRODUODENOSCOPY (EGD) WITH PROPOFOL;  Surgeon: Toledo, Benay Pike, MD;  Location: [facility];  Service: Endoscopy;  Laterality: N/A;   KYPHOPLASTY N/A 08/10/2017   Procedure: Hewitt Shorts;  Surgeon: Hessie Knows, MD;  Location: [facility];  Service: Orthopedics;  Laterality: N/A;   KYPHOPLASTY N/A 09/09/2017   Procedure: [id number],D3;  Surgeon: Hessie Knows, MD;  Location: [facility];  Service: Orthopedics;  Laterality: N/A;   KYPHOPLASTY N/A 09/28/2017   Procedure: [id number];  Surgeon: Hessie Knows, MD;  Location: [facility];  Service: Orthopedics;  Laterality: N/A;   KYPHOPLASTY N/A 06/19/2019   Procedure: KYPHOPLASTY T5;  Surgeon: Hessie Knows, MD;  Location: [facility];  Service: Orthopedics;  Laterality: N/A;   OOPHORECTOMY Bilateral 2003   RIGHT/LEFT HEART CATH AND CORONARY ANGIOGRAPHY Bilateral 10/19/2019   Procedure: RIGHT/LEFT HEART CATH AND CORONARY ANGIOGRAPHY;  Surgeon: Yolonda Kida, MD;  Location: [facility] [facility];  Service: Cardiovascular;  Laterality: Bilateral;     FAMILY HISTORY   Family History  Problem Relation Age of Onset   Kidney disease Mother    Diabetes Mellitus II Brother      SOCIAL HISTORY    Social History   Tobacco Use   Smoking status: Former    Packs/day: 1.00    Types: Cigarettes    Quit date: 2009    Years since quitting: 14.0   Smokeless tobacco: Never  Vaping Use   Vaping Use: Never used  Substance Use Topics   Alcohol use: No   Drug use: No     MEDICATIONS    Home Medication:    Current Medication:  Current Facility-Administered Medications:    acetaminophen (TYLENOL) tablet 650 mg, 650 mg, Oral, Q6H PRN, 650 mg at 03/26/21 0932 **OR** acetaminophen (TYLENOL) suppository 650 mg, 650 mg, Rectal, Q6H PRN, Mansy, Jan A, MD   ALPRAZolam Duanne Moron) tablet 0.25 mg, 0.25 mg, Oral, TID PRN, Sharion Settler, NP, 0.25 mg at 03/22/21 2224   cholecalciferol (VITAMIN D3) tablet 2,000 Units, 2,000 Units, Oral, Daily, Mansy, Jan A, MD, 2,000 Units at 03/26/21 0808   [START ON 03/31/2021] cyanocobalamin ((VITAMIN B-12)) injection 1,000 mcg, 1,000 mcg, Intramuscular, Q30 days, Mansy, Jan A, MD   cyclobenzaprine (FLEXERIL) tablet 5 mg, 5 mg, Oral, TID PRN, Mansy, Jan A, MD, 5 mg at 03/26/21 0932   diclofenac Sodium (VOLTAREN) 1 % topical gel 4 g, 4 g, Topical, QID, Ghimire, Kuber, MD, 4 g at 03/26/21 0812   enoxaparin (LOVENOX) injection 40 mg, 40 mg, Subcutaneous, Daily, Mansy, Jan A, MD, 40 mg at 03/26/21 0811   fluticasone (FLONASE) 50 MCG/ACT nasal spray 2 spray, 2 spray, Each Nare, Daily PRN, Mansy, Jan A, MD   furosemide (LASIX) injection 20 mg, 20 mg, Intravenous, BID, Lanney Gins, Joe Gee, MD, 20 mg at 03/26/21 0810   guaiFENesin (MUCINEX) 12 hr tablet 600 mg, 600 mg, Oral, BID, Mansy, Jan A, MD, 600 mg at 03/26/21 3818   Ipratropium-Albuterol (COMBIVENT) respimat 2 puff, 2 puff, Inhalation, Q4H PRN, Mansy, Jan A, MD   ipratropium-albuterol (DUONEB) 0.5-2.5 (3) MG/3ML nebulizer solution 3 mL, 3 mL, Nebulization, TID, Ghimire, Kuber, MD, 3 mL at 03/26/21 0733   magnesium hydroxide (MILK OF MAGNESIA) suspension 30 mL, 30 mL, Oral, Daily PRN, Mansy, Jan A, MD   multivitamin  with minerals tablet 1 tablet, 1 tablet, Oral, Daily, Mansy, Jan A, MD, 1 tablet at 03/26/21 0807   ondansetron (ZOFRAN) tablet 4 mg, 4 mg, Oral, Q6H PRN **OR** ondansetron (ZOFRAN) injection 4 mg, 4 mg, Intravenous, Q6H PRN, Mansy, Jan A, MD   pantoprazole (PROTONIX) EC tablet 40 mg, 40 mg, Oral, Daily, Mansy, Jan A, MD, 40 mg at 03/26/21 2993   predniSONE (DELTASONE) tablet 5 mg, 5 mg, Oral, Q breakfast, Dallie Piles, RPH, 5 mg at 03/26/21 0809   sodium chloride (OCEAN) 0.65 % nasal spray 1 spray, 1 spray, Nasal, PRN, Mansy, Jan A, MD   traZODone (DESYREL) tablet 25 mg, 25 mg, Oral, QHS PRN, Mansy, Jan A, MD, 25 mg at 03/22/21 2224    ALLERGIES   Methotrexate derivatives, Other, Ranitidine, Sulfasalazine, and Topiramate  REVIEW OF SYSTEMS    Review of Systems:  Gen:  Denies  fever, sweats, chills weigh loss  HEENT: Denies blurred vision, double vision, ear pain, eye pain, hearing loss, nose bleeds, sore throat Cardiac:  No dizziness, chest pain or heaviness, chest tightness,edema Resp:   Denies cough or sputum porduction, shortness of breath,wheezing, hemoptysis,  Gi: Denies swallowing difficulty, stomach pain, nausea or vomiting, diarrhea, constipation, bowel incontinence Gu:  Denies bladder incontinence, burning urine Ext:   Denies Joint pain, stiffness or swelling Skin: Denies  skin rash, easy bruising or bleeding or hives Endoc:  Denies polyuria, polydipsia , polyphagia or weight change Psych:   Denies depression, insomnia or hallucinations   Other:  All other systems negative   VS: BP 122/74 (BP Location: Left Arm)    Pulse (!) 113    Temp 99.2 F (37.3 C) (Oral)    Resp 20    Ht 5\' 2"  (1.575 m)    Wt 63 kg    SpO2 96%    BMI 25.42 kg/m      PHYSICAL EXAM    GENERAL:NAD, no fevers, chills, no weakness no fatigue HEAD: Normocephalic, atraumatic.  EYES: Pupils equal, round, reactive to light. Extraocular muscles intact. No scleral icterus.  MOUTH: Moist  mucosal membrane. Dentition intact. No abscess noted.  EAR, NOSE, THROAT: Clear without exudates. No external lesions.  NECK: Supple. No thyromegaly. No nodules. No JVD.  PULMONARY: Diffuse coarse rhonchi right sided +wheezes CARDIOVASCULAR: S1 and S2. Regular rate and rhythm. No murmurs, rubs, or gallops. No edema. Pedal pulses 2+ bilaterally.  GASTROINTESTINAL: Soft, nontender, nondistended. No masses. Positive bowel sounds. No hepatosplenomegaly.  MUSCULOSKELETAL: No swelling, clubbing, or edema. Range of motion full in all extremities.  NEUROLOGIC: Cranial nerves II through XII are intact. No gross focal neurological deficits. Sensation intact. Reflexes intact.  SKIN: No ulceration, lesions, rashes, or cyanosis. Skin warm and dry. Turgor intact.  PSYCHIATRIC: Mood, affect within normal limits. The patient is awake, alert and oriented x 3. Insight, judgment intact.       IMAGING    DG Chest 2 View  Result Date: 03/17/2021 CLINICAL DATA:  Shortness of breath. EXAM: CHEST - 2 VIEW COMPARISON:  March 11, 2021. FINDINGS: The heart size and mediastinal contours are within normal limits. Mild bibasilar subsegmental atelectasis is noted. Small pleural effusions may be present. Status post kyphoplasty at multiple levels of the thoracic spine. Multiple probable old compression fractures are noted as well. IMPRESSION: Mild bibasilar subsegmental atelectasis with small bilateral pleural effusions. Electronically Signed   By: Marijo Conception M.D.   On: 03/17/2021 15:36   DG Chest 2 View  Result Date: 03/11/2021 CLINICAL DATA:  Shortness of breath, cough EXAM: CHEST - 2 VIEW COMPARISON:  09/15/2020 FINDINGS: Cardiac size is within normal limits. Thoracic aorta is tortuous and ectatic. Increase in AP diameter of chest suggests COPD. There are no signs of alveolar pulmonary edema or focal pulmonary consolidation. There is improvement in aeration of lower lung fields. There is no pleural effusion or  pneumothorax. Osteopenia is seen in bony structures. There is decrease in height of multiple thoracic vertebral bodies. There is previous vertebroplasty in multiple thoracic vertebral bodies. IMPRESSION: COPD. There are no signs of pulmonary edema or focal pulmonary consolidation. Electronically Signed   By: Elmer Picker M.D.   On: 03/11/2021 09:43   CT Angio Chest PE W and/or Wo Contrast  Result Date: 03/18/2021 CLINICAL DATA:  Low oxygen saturation and elevated D-dimer. EXAM:  CT ANGIOGRAPHY CHEST WITH CONTRAST TECHNIQUE: Multidetector CT imaging of the chest was performed using the standard protocol during bolus administration of intravenous contrast. Multiplanar CT image reconstructions and MIPs were obtained to evaluate the vascular anatomy. CONTRAST:  64mL OMNIPAQUE IOHEXOL 350 MG/ML SOLN COMPARISON:  June 16, 2019 FINDINGS: Cardiovascular: There is marked severity calcification of the thoracic aorta, without evidence of aortic aneurysm or dissection. Satisfactory opacification of the pulmonary arteries to the segmental level. No evidence of pulmonary embolism. Normal heart size. No pericardial effusion. Mediastinum/Nodes: No enlarged mediastinal, hilar, or axillary lymph nodes. Thyroid gland, trachea, and esophagus demonstrate no significant findings. Lungs/Pleura: There is mild central lobular emphysema. Mild atelectatic changes are seen within the posterior aspects of the bilateral lung bases, right greater than left. There is no evidence of a pleural effusion or pneumothorax. Upper Abdomen: A 1.8 cm x 1.1 cm focus of parenchymal low attenuation is seen within the posterior aspect of the left lobe of the liver. Musculoskeletal: Compression fracture deformities of indeterminate age are seen involving the T6, T7, T8 and T9 vertebral bodies. This is most severe at the level of T7, which represents a new finding when compared to the prior study. Evidence of prior vertebroplasty is noted at the levels  of T5, T11, T12, L1 and L2. Review of the MIP images confirms the above findings. IMPRESSION: 1. No evidence of pulmonary embolus. 2. Mild central lobular emphysema. 3. Compression fracture deformities of indeterminate age involving the T6, T7, T8 and T9 vertebral bodies, most severe at the level of T7. 4. Evidence of prior vertebroplasty at the levels of T5, T11, T12, L1 and L2. Aortic Atherosclerosis (ICD10-I70.0) and Emphysema (ICD10-J43.9). Electronically Signed   By: Virgina Norfolk M.D.   On: 03/18/2021 04:00   US Venous Img Lower Bilateral (DVT)  Result Date: 03/19/2021 CLINICAL DATA:  Shortness of breath, leg pain EXAM: BILATERAL LOWER EXTREMITY VENOUS DOPPLER ULTRASOUND TECHNIQUE: Gray-scale sonography with compression, as well as color and duplex ultrasound, were performed to evaluate the deep venous system(s) from the level of the common femoral vein through the popliteal and proximal calf veins. COMPARISON:  None. FINDINGS: VENOUS Normal compressibility of BILATERAL common femoral, superficial femoral, and popliteal veins, as well as the visualized calf veins. Visualized portions of BILATERAL profunda femoral vein and great saphenous vein unremarkable. No filling defects to suggest DVT on grayscale or color Doppler imaging. Doppler waveforms show normal direction of venous flow, normal respiratory plasticity and response to augmentation. OTHER None. Limitations: none IMPRESSION: No evidence of deep venous thrombosis in either lower extremity. Electronically Signed   By: Lavonia Dana M.D.   On: 03/19/2021 09:59   DG Chest Port 1 View  Result Date: 03/24/2021 CLINICAL DATA:  Difficulty breathing EXAM: PORTABLE CHEST 1 VIEW COMPARISON:  Previous studies including the examination of 03/21/2021 FINDINGS: Transverse diameter of heart is slightly increased. Thoracic aorta is tortuous and ectatic. There are small linear densities in the lower lung fields, more so on the left side with no significant  interval change. There is no focal pulmonary consolidation. There is no significant pleural effusion or pneumothorax. IMPRESSION: Linear densities in the lower lung fields have not changed significantly. There are no new focal infiltrates or signs of pulmonary edema. Electronically Signed   By: Elmer Picker M.D.   On: 03/24/2021 09:50   DG Chest Port 1 View  Result Date: 03/21/2021 CLINICAL DATA:  Shortness of breath EXAM: PORTABLE CHEST 1 VIEW COMPARISON:  Previous studies including the  examination of 03/17/2021 FINDINGS: Cardiac size is unremarkable. Thoracic aorta is tortuous and ectatic. There are no signs of pulmonary edema. There is crowding of markings in the lower lung fields with interval improvement. No new focal infiltrates are seen. There is no pleural effusion or pneumothorax. There is evidence of vertebroplasty in the multiple thoracic vertebral bodies IMPRESSION: There is interval improvement in aeration of lower lung fields suggesting resolving subsegmental atelectasis. There are no new infiltrates or signs of pulmonary edema. Electronically Signed   By: Elmer Picker M.D.   On: 03/21/2021 12:06   ECHOCARDIOGRAM COMPLETE  Result Date: 03/20/2021    ECHOCARDIOGRAM REPORT   Patient Name:   TASNIM BALENTINE Date of Exam: 03/20/2021 Medical Rec #:  [id number]     Height:       62.0 in Accession #:    [id number]    Weight:       139.0 lb Date of Birth:  20-Apr-1945     BSA:          1.638 m Patient Age:    15 years      BP:           128/78 mmHg Patient Gender: F             HR:           112 bpm. Exam Location:  [facility] Procedure: 2D Echo, Color Doppler and Cardiac Doppler Indications:     R06.00 Dyspnea  History:         Patient has no prior history of Echocardiogram examinations.                  COPD, Signs/Symptoms:Shortness of Breath; Risk                  Factors:Hypertension. Positive for influenza type A.  Sonographer:     Charmayne Sheer Referring Phys:  [id number] Barb Merino Diagnosing  Phys: Isaias Cowman MD  Sonographer Comments: Suboptimal parasternal window and suboptimal subcostal window. Image acquisition challenging due to COPD. IMPRESSIONS  1. Left ventricular ejection fraction, by estimation, is 55 to 60%. The left ventricle has normal function. The left ventricle has no regional wall motion abnormalities. Left ventricular diastolic parameters are consistent with Grade I diastolic dysfunction (impaired relaxation).  2. Right ventricular systolic function is normal. The right ventricular size is normal.  3. The mitral valve is normal in structure. Mild mitral valve regurgitation. No evidence of mitral stenosis.  4. The aortic valve is normal in structure. Aortic valve regurgitation is not visualized. No aortic stenosis is present.  5. The inferior vena cava is normal in size with greater than 50% respiratory variability, suggesting right atrial pressure of 3 mmHg. FINDINGS  Left Ventricle: Left ventricular ejection fraction, by estimation, is 55 to 60%. The left ventricle has normal function. The left ventricle has no regional wall motion abnormalities. The left ventricular internal cavity size was normal in size. There is  no left ventricular hypertrophy. Left ventricular diastolic parameters are consistent with Grade I diastolic dysfunction (impaired relaxation). Right Ventricle: The right ventricular size is normal. No increase in right ventricular wall thickness. Right ventricular systolic function is normal. Left Atrium: Left atrial size was normal in size. Right Atrium: Right atrial size was normal in size. Pericardium: There is no evidence of pericardial effusion. Mitral Valve: The mitral valve is normal in structure. Mild mitral valve regurgitation. No evidence of mitral valve stenosis. MV peak gradient, 5.3 mmHg. The mean mitral  valve gradient is 3.0 mmHg. Tricuspid Valve: The tricuspid valve is normal in structure. Tricuspid valve regurgitation is mild . No evidence of  tricuspid stenosis. Aortic Valve: The aortic valve is normal in structure. Aortic valve regurgitation is not visualized. No aortic stenosis is present. Aortic valve mean gradient measures 4.0 mmHg. Aortic valve peak gradient measures 7.2 mmHg. Aortic valve area, by VTI measures 3.56 cm. Pulmonic Valve: The pulmonic valve was normal in structure. Pulmonic valve regurgitation is not visualized. No evidence of pulmonic stenosis. Aorta: The aortic root is normal in size and structure. Venous: The inferior vena cava is normal in size with greater than 50% respiratory variability, suggesting right atrial pressure of 3 mmHg. IAS/Shunts: No atrial level shunt detected by color flow Doppler.  LEFT VENTRICLE PLAX 2D LVOT diam:     2.20 cm     Diastology LV SV:         84          LV e' medial:    6.42 cm/s LV SV Index:   52          LV E/e' medial:  16.5 LVOT Area:     3.80 cm    LV e' lateral:   7.07 cm/s                            LV E/e' lateral: 15.0  LV Volumes (MOD) LV vol d, MOD A2C: 48.6 ml LV vol d, MOD A4C: 62.2 ml LV vol s, MOD A2C: 24.0 ml LV vol s, MOD A4C: 32.5 ml LV SV MOD A2C:     24.6 ml LV SV MOD A4C:     62.2 ml LV SV MOD BP:      28.9 ml RIGHT VENTRICLE RV Basal diam:  3.16 cm LEFT ATRIUM             Index        RIGHT ATRIUM           Index LA Vol (A2C):   26.6 ml 16.24 ml/m  RA Area:     10.30 cm LA Vol (A4C):   23.0 ml 14.04 ml/m  RA Volume:   21.80 ml  13.31 ml/m LA Biplane Vol: 26.3 ml 16.06 ml/m  AORTIC VALVE                    PULMONIC VALVE AV Area (Vmax):    3.49 cm     PV Vmax:       1.20 m/s AV Area (Vmean):   3.31 cm     PV Vmean:      83.500 cm/s AV Area (VTI):     3.56 cm     PV VTI:        0.205 m AV Vmax:           134.00 cm/s  PV Peak grad:  5.8 mmHg AV Vmean:          98.100 cm/s  PV Mean grad:  3.0 mmHg AV VTI:            0.237 m AV Peak Grad:      7.2 mmHg AV Mean Grad:      4.0 mmHg LVOT Vmax:         123.00 cm/s LVOT Vmean:        85.500 cm/s LVOT VTI:          0.222 m  LVOT/AV VTI  ratio: 0.94  AORTA Ao Root diam: 2.80 cm MITRAL VALVE MV Area (PHT): 7.09 cm     SHUNTS MV Area VTI:   5.62 cm     Systemic VTI:  0.22 m MV Peak grad:  5.3 mmHg     Systemic Diam: 2.20 cm MV Mean grad:  3.0 mmHg MV Vmax:       1.15 m/s MV Vmean:      77.5 cm/s MV Decel Time: 107 msec MV E velocity: 106.00 cm/s MV A velocity: 113.00 cm/s MV E/A ratio:  0.94 Isaias Cowman MD Electronically signed by Isaias Cowman MD Signature Date/Time: 03/20/2021/1:43:48 PM    Final       ASSESSMENT/PLAN   Acute hypoxemic respiratory failure   Due to concomitant influenza A infection with COPD exacerbation and atelectasis with mild effusion of right pleural space.      - patient would benefit from pulmonary optimization  -c/w zithromax/rocepin  Influenza A infetion   - Tamiflu   - stopped Tessalon to allow expectoration    - Dcd Tussinex hinders respiratory effort   - dcd pulmicort - patient on IV steroids                03/24/21-dcd steroids   Mild pulmonary edema  - lasix 20 IV bid   Acute on Chronic COPD    - continue Duoneb    - patient has allergy to Trelegy and Anoro    - continue steriods - reduced solumedrol to 40 once daily IV>>20>>dcd     - IS and flutter are at bedside   Bibasilar atelectasis    Patient would benefit from aggressive recruitment maneuvers -PT/OT should be daily    Thank you for allowing me to participate in the care of this patient.  Total face to face encounter time for this patient visit was >45 min. >50% of the time was  spent in counseling and coordination of care.   Patient/Family are satisfied with care plan and all questions have been answered.  This document was prepared using Dragon voice recognition software and may include unintentional dictation errors.     Ottie Glazier, M.D.  Division of [facility]

## 2021-03-26 NOTE — TOC Progression Note (Signed)
Transition of Care [facility]) - Progression Note    Patient Details  Name: Ashley Mack MRN: [id number] Date of Birth: July 11, 1945  Transition of Care [facility]) CM/SW [place], LCSW Phone Number: 03/26/2021, 12:50 PM  Clinical Narrative:   [place] is arranged for dc.    Expected Discharge Plan: [place] Barriers to Discharge: Continued Medical Work up  Expected Discharge Plan and Services Expected Discharge Plan: [place] In-house Referral: NA   Post Acute Care Choice: [place] arrangements for the past 2 months: Single Family Home                                       Social Determinants of Health (SDOH) Interventions    Readmission Risk Interventions No flowsheet data found.

## 2021-03-26 NOTE — Progress Notes (Addendum)
PROGRESS NOTE    Ashley Mack  ZOX:[id number] DOB: 01/05/1946 DOA: 03/18/2021 PCP: Kirk Ruths, MD    Assessment & Plan:   Principal Problem:   COPD exacerbation [facility]) Active Problems:   COPD with acute exacerbation ([place])   COPD exacerbation: likely secondary to influenza A. Completed tamiflu course. Continue on bronchodilators, steroids and encourage incentive spirometry. CTA chest was neg for pulmon embolus. Pulmon following and recs apprec   Pulmonary edema: continue on IV lasix. Monitor I/Os   Acute hypoxic respiratory failure: likely secondary to above. Continue on supplemental oxygen and wean as tolerated   Leukocytosis: likely secondary to steroid use   DVT prophylaxis: lovenox  Code Status: DNR Family Communication:  Disposition Plan: likely d/c home w/ HH vs SNF  Level of care: Med-Surg  Status is: Inpatient  Remains inpatient appropriate because: still requiring supplemental oxygen       Consultants:  Pulmon   Procedures:   Antimicrobials:   Subjective: Pt c/o shortness of breath   Objective: Vitals:   03/25/21 2000 03/25/21 2043 03/26/21 0410 03/26/21 0935  BP:   111/68 122/74  Pulse:  (!) 118 (!) 106 (!) 113  Resp:   (!) 22 20  Temp:   98.7 F (37.1 C) 99.2 F (37.3 C)  TempSrc:    Oral  SpO2: 96% 97% 95% 96%  Weight:      Height:        Intake/Output Summary (Last 24 hours) at 03/26/2021 1238 Last data filed at 03/26/2021 0410 Gross per 24 hour  Intake --  Output 200 ml  Net -200 ml   Filed Weights   03/17/21 1444 03/19/21 2148  Weight: 63.5 kg 63 kg    Examination:  General exam: Appears calm but uncomfortable  Respiratory system: course breath sounds b/l Cardiovascular system: S1 & S2 +. No rubs, gallops or clicks.  Gastrointestinal system: Abdomen is nondistended, soft and nontender. Normal bowel sounds heard. Central nervous system: Alert and oriented. Moves all extremities  Psychiatry: Judgement and insight  appear normal. Flat mood and affect     Data Reviewed: I have personally reviewed following labs and imaging studies  CBC: Recent Labs  Lab 03/20/21 0551 03/22/21 0310 03/24/21 0913 03/26/21 0845  WBC 21.9* 20.6* 19.0* 22.9*  NEUTROABS 20.5* 19.0* 16.0*  --   HGB 10.1* 11.5* 12.1 12.0  HCT 30.9* 35.8* 37.8 36.4  MCV 85.4 84.8 85.5 83.9  PLT 364 414* 409* 811   Basic Metabolic Panel: Recent Labs  Lab 03/20/21 0551 03/24/21 0913  NA 137 134*  K 4.5 3.6  CL 101 94*  CO2 29 34*  GLUCOSE 155* 118*  BUN 27* 30*  CREATININE 0.85 0.64  CALCIUM 8.4* 8.4*  MG 2.4  --   PHOS 3.3  --    GFR: Estimated Creatinine Clearance: 51.4 mL/min (by C-G formula based on SCr of 0.64 mg/dL). Liver Function Tests: Recent Labs  Lab 03/24/21 0913  AST 18  ALT 21  ALKPHOS 64  BILITOT 0.5  PROT 6.0*  ALBUMIN 3.0*   No results for input(s): LIPASE, AMYLASE in the last 168 hours. No results for input(s): AMMONIA in the last 168 hours. Coagulation Profile: No results for input(s): INR, PROTIME in the last 168 hours. Cardiac Enzymes: No results for input(s): CKTOTAL, CKMB, CKMBINDEX, TROPONINI in the last 168 hours. BNP (last 3 results) No results for input(s): PROBNP in the last 8760 hours. HbA1C: No results for input(s): HGBA1C in the last 72 hours.  CBG: No results for input(s): GLUCAP in the last 168 hours. Lipid Profile: No results for input(s): CHOL, HDL, LDLCALC, TRIG, CHOLHDL, LDLDIRECT in the last 72 hours. Thyroid Function Tests: No results for input(s): TSH, T4TOTAL, FREET4, T3FREE, THYROIDAB in the last 72 hours. Anemia Panel: No results for input(s): VITAMINB12, FOLATE, FERRITIN, TIBC, IRON, RETICCTPCT in the last 72 hours. Sepsis Labs: No results for input(s): PROCALCITON, LATICACIDVEN in the last 168 hours.  Recent Results (from the past 240 hour(s))  Resp Panel by RT-PCR (Flu A&B, Covid) Nasopharyngeal Swab     Status: Abnormal   Collection Time: 03/17/21  2:55 PM    Specimen: Nasopharyngeal Swab; Nasopharyngeal(NP) swabs in vial transport medium  Result Value Ref Range Status   SARS Coronavirus 2 by RT PCR NEGATIVE NEGATIVE Final    Comment: (NOTE) SARS-CoV-2 target nucleic acids are NOT DETECTED.  The SARS-CoV-2 RNA is generally detectable in upper respiratory specimens during the acute phase of infection. The lowest concentration of SARS-CoV-2 viral copies this assay can detect is 138 copies/mL. A negative result does not preclude SARS-Cov-2 infection and should not be used as the sole basis for treatment or other patient management decisions. A negative result may occur with  improper specimen collection/handling, submission of specimen other than nasopharyngeal swab, presence of viral mutation(s) within the areas targeted by this assay, and inadequate number of viral copies(<138 copies/mL). A negative result must be combined with clinical observations, patient history, and epidemiological information. The expected result is Negative.  Fact Sheet for Patients:  EntrepreneurPulse.com.au  Fact Sheet for Healthcare Providers:  IncredibleEmployment.be  This test is no t yet approved or cleared by the [place] FDA and  has been authorized for detection and/or diagnosis of SARS-CoV-2 by FDA under an Emergency Use Authorization (EUA). This EUA will remain  in effect (meaning this test can be used) for the duration of the COVID-19 declaration under Section 564(b)(1) of the Act, 21 U.S.C.section 360bbb-3(b)(1), unless the authorization is terminated  or revoked sooner.       Influenza A by PCR POSITIVE (A) NEGATIVE Final   Influenza B by PCR NEGATIVE NEGATIVE Final    Comment: (NOTE) The Xpert Xpress SARS-CoV-2/FLU/RSV plus assay is intended as an aid in the diagnosis of influenza from Nasopharyngeal swab specimens and should not be used as a sole basis for treatment. Nasal washings and aspirates are  unacceptable for Xpert Xpress SARS-CoV-2/FLU/RSV testing.  Fact Sheet for Patients: EntrepreneurPulse.com.au  Fact Sheet for Healthcare Providers: IncredibleEmployment.be  This test is not yet approved or cleared by the [place] FDA and has been authorized for detection and/or diagnosis of SARS-CoV-2 by FDA under an Emergency Use Authorization (EUA). This EUA will remain in effect (meaning this test can be used) for the duration of the COVID-19 declaration under Section 564(b)(1) of the Act, 21 U.S.C. section 360bbb-3(b)(1), unless the authorization is terminated or revoked.  Performed at [facility], [address] [place], [place] [address]          Radiology Studies: No results found.      Scheduled Meds:  cholecalciferol  2,000 Units Oral Daily   [START ON 03/31/2021] cyanocobalamin  1,000 mcg Intramuscular Q30 days   diclofenac Sodium  4 g Topical QID   enoxaparin (LOVENOX) injection  40 mg Subcutaneous Daily   furosemide  20 mg Intravenous BID   guaiFENesin  600 mg Oral BID   ipratropium-albuterol  3 mL Nebulization TID   multivitamin with minerals  1 tablet  Oral Daily   pantoprazole  40 mg Oral Daily   predniSONE  5 mg Oral Q breakfast   Continuous Infusions:   LOS: 8 days    Time spent: 20 mins     Wyvonnia Dusky, MD [facility] Pager [phone]  If 7PM-7AM, please contact night-coverage 03/26/2021, 12:38 PM

## 2021-03-27 ENCOUNTER — Inpatient Hospital Stay: Payer: Medicare Other

## 2021-03-27 DIAGNOSIS — N179 Acute kidney failure, unspecified: Secondary | ICD-10-CM

## 2021-03-27 LAB — BLOOD GAS, ARTERIAL
Acid-Base Excess: 19.3 mmol/L — ABNORMAL HIGH (ref 0.0–2.0)
Bicarbonate: 43.2 mmol/L — ABNORMAL HIGH (ref 20.0–28.0)
FIO2: 0.28
O2 Saturation: 99.3 %
Patient temperature: 37
pCO2 arterial: 45 mmHg (ref 32.0–48.0)
pH, Arterial: 7.59 — ABNORMAL HIGH (ref 7.350–7.450)
pO2, Arterial: 129 mmHg — ABNORMAL HIGH (ref 83.0–108.0)

## 2021-03-27 LAB — BASIC METABOLIC PANEL
Anion gap: 11 (ref 5–15)
BUN: 36 mg/dL — ABNORMAL HIGH (ref 8–23)
CO2: 35 mmol/L — ABNORMAL HIGH (ref 22–32)
Calcium: 8.6 mg/dL — ABNORMAL LOW (ref 8.9–10.3)
Chloride: 87 mmol/L — ABNORMAL LOW (ref 98–111)
Creatinine, Ser: 1.07 mg/dL — ABNORMAL HIGH (ref 0.44–1.00)
GFR, Estimated: 53 mL/min — ABNORMAL LOW (ref >60)
Glucose, Bld: 111 mg/dL — ABNORMAL HIGH (ref 70–99)
Potassium: 3.3 mmol/L — ABNORMAL LOW (ref 3.5–5.1)
Sodium: 133 mmol/L — ABNORMAL LOW (ref 135–145)

## 2021-03-27 LAB — CBC
HCT: 33.8 % — ABNORMAL LOW (ref 36.0–46.0)
Hemoglobin: 10.9 g/dL — ABNORMAL LOW (ref 12.0–15.0)
MCH: 26.8 pg (ref 26.0–34.0)
MCHC: 32.2 g/dL (ref 30.0–36.0)
MCV: 83.3 fL (ref 80.0–100.0)
Platelets: 419 10*3/uL — ABNORMAL HIGH (ref 150–400)
RBC: 4.06 MIL/uL (ref 3.87–5.11)
RDW: 16.1 % — ABNORMAL HIGH (ref 11.5–15.5)
WBC: 20.6 10*3/uL — ABNORMAL HIGH (ref 4.0–10.5)
nRBC: 0 % (ref 0.0–0.2)

## 2021-03-27 LAB — PROCALCITONIN: Procalcitonin: 0.33 ng/mL

## 2021-03-27 MED ORDER — SODIUM CHLORIDE 0.9 % IV SOLN
100.0000 mg | Freq: Two times a day (BID) | INTRAVENOUS | Status: DC
  Administered 2021-03-27 – 2021-03-28 (×3): 100 mg via INTRAVENOUS
  Filled 2021-03-27 (×5): qty 100

## 2021-03-27 MED ORDER — POTASSIUM CHLORIDE CRYS ER 20 MEQ PO TBCR
40.0000 meq | EXTENDED_RELEASE_TABLET | Freq: Once | ORAL | Status: AC
  Administered 2021-03-27: 40 meq via ORAL
  Filled 2021-03-27: qty 2

## 2021-03-27 MED ORDER — MECLIZINE HCL 25 MG PO TABS
25.0000 mg | ORAL_TABLET | Freq: Three times a day (TID) | ORAL | Status: DC | PRN
  Administered 2021-03-27: 25 mg via ORAL
  Filled 2021-03-27 (×3): qty 1

## 2021-03-27 NOTE — Progress Notes (Signed)
Physical Therapy Treatment Patient Details Name: Ashley Mack MRN: [id number] DOB: [birth year] Today's Date: 03/27/2021   History of Present Illness Pt is a 76 y.o. female with medical history significant for hypertension, anxiety, GERD, migraine, rheumatoid arthritis, fatty liver and COPD, who presented to emergency room with acute onset of worsening dyspnea with associated congested productive cough and wheezing for the last week. Workup +flu.    PT Comments    Patient alert, agreeable to PT. Pt demonstrated some improvement in activity tolerance today. Supine <> sit with supervision, HOB elevated with use of bed rails. She was able to sit <> stand three times with RW and CGA, education on safe hand placement as needed. She ambulated 3 bouts ~24ft, CGA with RW. Seated rest break between each bout, spO2 >90%, HR in 120s. Returned to supine and experienced increased SOB and RR, with rest and use of inhaler and PLB, her breathing did calm. All needs in reach at this time. Pt upgraded to HHPT with frequent/constant supervision/assistance. Pt adamant about having enough support at home. Pt provided with stair navigation with [facility] education, verbalized understanding. The patient would benefit from further skilled PT intervention to continue to progress towards goals. Recommendation remains appropriate.     Recommendations for follow up therapy are one component of a multi-disciplinary discharge planning process, led by the attending physician.  Recommendations may be updated based on patient status, additional functional criteria and insurance authorization.  Follow Up Recommendations  Home health PT     Assistance Recommended at Discharge Frequent or constant Supervision/Assistance  Patient can return home with the following A little help with walking and/or transfers;A little help with bathing/dressing/bathroom;Assistance with cooking/housework;Assistance with feeding;Assist for transportation;Help  with stairs or ramp for entrance   Equipment Recommendations  Rolling walker (2 wheels);BSC/3in1    Recommendations for Other Services       Precautions / Restrictions Precautions Precautions: Fall Restrictions Weight Bearing Restrictions: No     Mobility  Bed Mobility Overal bed mobility: Needs Assistance Bed Mobility: Supine to Sit;Sit to Supine     Supine to sit: Supervision;HOB elevated Sit to supine: Supervision;HOB elevated        Transfers Overall transfer level: Needs assistance Equipment used: Rolling walker (2 wheels) Transfers: Sit to/from Stand Sit to Stand: Min guard           General transfer comment: Requires verbal cues for safe hand placement with RW use    Ambulation/Gait Ambulation/Gait assistance: Min guard Gait Distance (Feet):  (3 bouts of ~83ft. seated rest break between bouts) Assistive device: Rolling walker (2 wheels)         General Gait Details: decreased velocity, decreased step length/height   Stairs             Wheelchair Mobility    Modified Rankin (Stroke Patients Only)       Balance Overall balance assessment: Needs assistance Sitting-balance support: Feet supported Sitting balance-Leahy Scale: Good       Standing balance-Leahy Scale: Fair Standing balance comment: required some kind of UE support for any dynamic tasks/ambulations                            Cognition Arousal/Alertness: Awake/alert Behavior During Therapy: WFL for tasks assessed/performed Overall Cognitive Status: Within Functional Limits for tasks assessed  Exercises      General Comments General comments (skin integrity, edema, etc.): no true desaturation noted today, elevated HR to 120s      Pertinent Vitals/Pain Pain Assessment: Faces Faces Pain Scale: Hurts a little bit Pain Descriptors / Indicators: Grimacing;Aching Pain Intervention(s): Limited  activity within patient's tolerance;Monitored during session;Repositioned    Home Living                          Prior Function            PT Goals (current goals can now be found in the care plan section) Progress towards PT goals: Progressing toward goals    Frequency    Min 2X/week      PT Plan Discharge plan needs to be updated;Equipment recommendations need to be updated    Co-evaluation              AM-PAC PT "6 Clicks" Mobility   Outcome Measure  Help needed turning from your back to your side while in a flat bed without using bedrails?: A Little Help needed moving from lying on your back to sitting on the side of a flat bed without using bedrails?: A Little Help needed moving to and from a bed to a chair (including a wheelchair)?: A Little Help needed standing up from a chair using your arms (e.g., wheelchair or bedside chair)?: A Little Help needed to walk in hospital room?: A Little Help needed climbing 3-5 steps with a railing? : Total 6 Click Score: 16    End of Session Equipment Utilized During Treatment: Oxygen Activity Tolerance: Patient tolerated treatment well;Other (comment) (limited by some SOB) Patient left: in bed;with call bell/phone within reach;with bed alarm set Nurse Communication: Mobility status PT Visit Diagnosis: Other abnormalities of gait and mobility (R26.89);Difficulty in walking, not elsewhere classified (R26.2);Muscle weakness (generalized) (M62.81)     Time: 5364-6803 PT Time Calculation (min) (ACUTE ONLY): 28 min  Charges:  $Therapeutic Exercise: 23-37 mins                     Lieutenant Diego PT, DPT 10:11 AM,03/27/21

## 2021-03-27 NOTE — Progress Notes (Signed)
PULMONOLOGY         Date: 03/27/2021,   MRN# [id number] OSHA RANE 04/21/1945     AdmissionWeight: 63.5 kg                 CurrentWeight: 24 kg   Referring physician: Dr Sloan Leiter   CHIEF COMPLAINT:   Increased O2 requirement   HISTORY OF PRESENT ILLNESS   This is a 76 yo F with hx of anemia, anxiety, OA, RA, retinopathy, NASH, CKD, chronic migraines, osteoporosis, emphysema who came in with worsening SOB/DOE.  Seh was seen at urgent care and post treatment continued to deteriorate.  Influenza A came back positive and influenza antigen was negative.  COVID-19 PCR came back negative.  D-dimer was significantly elevated at 6.44.After 5d treatment patient has not reached improvement substantially and requires incresead O2 with PCCM consultation for further evaluation and management.    03/23/21- patient with increased O2 req, I have reduced steroids and increased diuretic. Continue current regimen.  03/24/21- patient feels improved s/p gentle diuresis , repeat CXR inprocess, CMP  and CBCwdiff and CRP today.  PT/OT today. Steroids stopped.    03/25/21- patient slowly improved, O2 weaned down from 6 to 4L/min continue gentle diuresis and PT.   03/26/21- patient remains on 4L.  She started PT   03/27/21-  patient has CXR done which appears worse slighly.  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)    Iron (Fe) deficiency anemia    Migraine headache    in past   Migraines    Motion sickness    No natural teeth    Osteoporosis    Rheumatoid arthritis ([place])    Vertigo       SURGICAL HISTORY   Past Surgical History:  Procedure Laterality Date   CATARACT EXTRACTION W/PHACO Right 08/12/2016   Procedure: CATARACT EXTRACTION PHACO AND INTRAOCULAR LENS PLACEMENT ([place])  Right;  Surgeon: Leandrew Koyanagi, MD;  Location: [place] [facility];  Service: Ophthalmology;  Laterality: Right;   CATARACT EXTRACTION W/PHACO Left 09/30/2016   Procedure: CATARACT EXTRACTION PHACO AND INTRAOCULAR LENS PLACEMENT ([place]) Left;  Surgeon: Leandrew Koyanagi, MD;  Location: [place];  Service: Ophthalmology;  Laterality: Left;   COLONOSCOPY     COLONOSCOPY WITH PROPOFOL N/A 12/09/2016   Procedure: COLONOSCOPY WITH PROPOFOL;  Surgeon: Toledo, Benay Pike, MD;  Location: [facility];  Service: Endoscopy;  Laterality: N/A;   ESOPHAGOGASTRODUODENOSCOPY     ESOPHAGOGASTRODUODENOSCOPY (EGD) WITH PROPOFOL N/A 12/09/2016   Procedure: ESOPHAGOGASTRODUODENOSCOPY (EGD) WITH PROPOFOL;  Surgeon: Toledo, Benay Pike, MD;  Location: [facility];  Service: Endoscopy;  Laterality: N/A;   KYPHOPLASTY N/A 08/10/2017   Procedure: Hewitt Shorts;  Surgeon: Hessie Knows, MD;  Location: [facility];  Service: Orthopedics;  Laterality: N/A;   KYPHOPLASTY N/A 09/09/2017   Procedure: [id number];  Surgeon: Hessie Knows, MD;  Location: [facility];  Service: Orthopedics;  Laterality: N/A;   KYPHOPLASTY N/A 09/28/2017   Procedure: [id number];  Surgeon: Hessie Knows, MD;  Location:  [facility];  Service: Orthopedics;  Laterality: N/A;   KYPHOPLASTY N/A 06/19/2019   Procedure: KYPHOPLASTY T5;  Surgeon: Hessie Knows, MD;  Location: [facility];  Service: Orthopedics;  Laterality: N/A;   OOPHORECTOMY Bilateral 2003   RIGHT/LEFT HEART CATH AND CORONARY ANGIOGRAPHY Bilateral 10/19/2019   Procedure: RIGHT/LEFT HEART CATH AND CORONARY ANGIOGRAPHY;  Surgeon: Yolonda Kida, MD;  Location: [place] [facility];  Service: Cardiovascular;  Laterality: Bilateral;     FAMILY HISTORY   Family History   Problem Relation Age of Onset   Kidney disease Mother    Diabetes Mellitus II Brother      SOCIAL HISTORY   Social History   Tobacco Use   Smoking status: Former    Packs/day: 1.00    Types: Cigarettes    Quit date: 2009    Years since quitting: 14.0   Smokeless tobacco: Never  Vaping Use   Vaping Use: Never used  Substance Use Topics   Alcohol use: No   Drug use: No     MEDICATIONS    Home Medication:    Current Medication:  Current Facility-Administered Medications:    acetaminophen (TYLENOL) tablet 650 mg, 650 mg, Oral, Q6H PRN, 650 mg at 03/26/21 1808 **OR** acetaminophen (TYLENOL) suppository 650 mg, 650 mg, Rectal, Q6H PRN, Mansy, Jan A, MD   ALPRAZolam Duanne Moron) tablet 0.25 mg, 0.25 mg, Oral, TID PRN, Sharion Settler, NP, 0.25 mg at 03/22/21 2224   cholecalciferol (VITAMIN D3) tablet 2,000 Units, 2,000 Units, Oral, Daily, Mansy, Jan A, MD, 2,000 Units at 03/27/21 1021   [START ON 03/31/2021] cyanocobalamin ((VITAMIN B-12)) injection 1,000 mcg, 1,000 mcg, Intramuscular, Q30 days, Mansy, Jan A, MD   cyclobenzaprine (FLEXERIL) tablet 5 mg, 5 mg, Oral, TID PRN, Mansy, Jan A, MD, 5 mg at 03/26/21 1809   diclofenac Sodium (VOLTAREN) 1 % topical gel 4 g, 4 g, Topical, QID, Ghimire, Kuber, MD, 4 g at 03/27/21 1021   enoxaparin (LOVENOX) injection 40 mg, 40 mg, Subcutaneous, Daily, Mansy, Jan A, MD, 40 mg at 03/27/21 1021   fluticasone (FLONASE) 50 MCG/ACT nasal spray 2 spray, 2 spray, Each Nare, Daily PRN, Mansy, Jan A, MD   furosemide (LASIX) injection 20 mg, 20 mg, Intravenous, BID, Kimberley Dastrup, MD, 20 mg at 03/27/21 1020   guaiFENesin (MUCINEX) 12 hr tablet 600 mg, 600 mg, Oral, BID, Mansy, Jan A, MD, 600 mg at 03/27/21 1021   Ipratropium-Albuterol (COMBIVENT) respimat 2 puff, 2 puff, Inhalation, Q4H PRN, Mansy, Jan A, MD   ipratropium-albuterol (DUONEB) 0.5-2.5 (3) MG/3ML nebulizer solution 3 mL, 3 mL, Nebulization, TID, Ghimire, Kuber, MD, 3 mL at 03/27/21 1337    magnesium hydroxide (MILK OF MAGNESIA) suspension 30 mL, 30 mL, Oral, Daily PRN, Mansy, Jan A, MD   meclizine (ANTIVERT) tablet 25 mg, 25 mg, Oral, TID PRN, Wyvonnia Dusky, MD   multivitamin with minerals tablet 1 tablet, 1 tablet, Oral, Daily, Mansy, Jan A, MD, 1 tablet at 03/27/21 1021   ondansetron (ZOFRAN) tablet 4 mg, 4 mg, Oral, Q6H PRN **OR** ondansetron (ZOFRAN) injection 4 mg, 4 mg, Intravenous, Q6H PRN, Mansy, Jan A, MD   pantoprazole (PROTONIX) EC tablet 40 mg, 40 mg, Oral, Daily, Mansy, Jan A, MD, 40 mg at 03/27/21 1021   potassium chloride SA (KLOR-CON M) CR tablet 40 mEq, 40 mEq, Oral, Once, Williams, Jamiese M, MD   predniSONE (DELTASONE) tablet 5 mg, 5 mg, Oral, Q breakfast, Dallie Piles, RPH, 5 mg at 03/27/21 1025   sodium  chloride (OCEAN) 0.65 % nasal spray 1 spray, 1 spray, Nasal, PRN, Mansy, Jan A, MD   traZODone (DESYREL) tablet 25 mg, 25 mg, Oral, QHS PRN, Mansy, Jan A, MD, 25 mg at 03/22/21 2224    ALLERGIES   Methotrexate derivatives, Other, Ranitidine, Sulfasalazine, and Topiramate     REVIEW OF SYSTEMS    Review of Systems:  Gen:  Denies  fever, sweats, chills weigh loss  HEENT: Denies blurred vision, double vision, ear pain, eye pain, hearing loss, nose bleeds, sore throat Cardiac:  No dizziness, chest pain or heaviness, chest tightness,edema Resp:   Denies cough or sputum porduction, shortness of breath,wheezing, hemoptysis,  Gi: Denies swallowing difficulty, stomach pain, nausea or vomiting, diarrhea, constipation, bowel incontinence Gu:  Denies bladder incontinence, burning urine Ext:   Denies Joint pain, stiffness or swelling Skin: Denies  skin rash, easy bruising or bleeding or hives Endoc:  Denies polyuria, polydipsia , polyphagia or weight change Psych:   Denies depression, insomnia or hallucinations   Other:  All other systems negative   VS: BP 114/65 (BP Location: Left Arm)    Pulse (!) 102    Temp 98.4 F (36.9 C) (Oral)    Resp 20     Ht 5\' 2"  (1.575 m)    Wt 63 kg    SpO2 90%    BMI 25.42 kg/m      PHYSICAL EXAM    GENERAL:NAD, no fevers, chills, no weakness no fatigue HEAD: Normocephalic, atraumatic.  EYES: Pupils equal, round, reactive to light. Extraocular muscles intact. No scleral icterus.  MOUTH: Moist mucosal membrane. Dentition intact. No abscess noted.  EAR, NOSE, THROAT: Clear without exudates. No external lesions.  NECK: Supple. No thyromegaly. No nodules. No JVD.  PULMONARY: Diffuse coarse rhonchi right sided +wheezes CARDIOVASCULAR: S1 and S2. Regular rate and rhythm. No murmurs, rubs, or gallops. No edema. Pedal pulses 2+ bilaterally.  GASTROINTESTINAL: Soft, nontender, nondistended. No masses. Positive bowel sounds. No hepatosplenomegaly.  MUSCULOSKELETAL: No swelling, clubbing, or edema. Range of motion full in all extremities.  NEUROLOGIC: Cranial nerves II through XII are intact. No gross focal neurological deficits. Sensation intact. Reflexes intact.  SKIN: No ulceration, lesions, rashes, or cyanosis. Skin warm and dry. Turgor intact.  PSYCHIATRIC: Mood, affect within normal limits. The patient is awake, alert and oriented x 3. Insight, judgment intact.       IMAGING    DG Chest 2 View  Result Date: 03/17/2021 CLINICAL DATA:  Shortness of breath. EXAM: CHEST - 2 VIEW COMPARISON:  March 11, 2021. FINDINGS: The heart size and mediastinal contours are within normal limits. Mild bibasilar subsegmental atelectasis is noted. Small pleural effusions may be present. Status post kyphoplasty at multiple levels of the thoracic spine. Multiple probable old compression fractures are noted as well. IMPRESSION: Mild bibasilar subsegmental atelectasis with small bilateral pleural effusions. Electronically Signed   By: Marijo Conception M.D.   On: 03/17/2021 15:36   DG Chest 2 View  Result Date: 03/11/2021 CLINICAL DATA:  Shortness of breath, cough EXAM: CHEST - 2 VIEW COMPARISON:  09/15/2020 FINDINGS:  Cardiac size is within normal limits. Thoracic aorta is tortuous and ectatic. Increase in AP diameter of chest suggests COPD. There are no signs of alveolar pulmonary edema or focal pulmonary consolidation. There is improvement in aeration of lower lung fields. There is no pleural effusion or pneumothorax. Osteopenia is seen in bony structures. There is decrease in height of multiple thoracic vertebral bodies. There is previous vertebroplasty in multiple  thoracic vertebral bodies. IMPRESSION: COPD. There are no signs of pulmonary edema or focal pulmonary consolidation. Electronically Signed   By: Elmer Picker M.D.   On: 03/11/2021 09:43   CT Angio Chest PE W and/or Wo Contrast  Result Date: 03/18/2021 CLINICAL DATA:  Low oxygen saturation and elevated D-dimer. EXAM: CT ANGIOGRAPHY CHEST WITH CONTRAST TECHNIQUE: Multidetector CT imaging of the chest was performed using the standard protocol during bolus administration of intravenous contrast. Multiplanar CT image reconstructions and MIPs were obtained to evaluate the vascular anatomy. CONTRAST:  108mL OMNIPAQUE IOHEXOL 350 MG/ML SOLN COMPARISON:  June 16, 2019 FINDINGS: Cardiovascular: There is marked severity calcification of the thoracic aorta, without evidence of aortic aneurysm or dissection. Satisfactory opacification of the pulmonary arteries to the segmental level. No evidence of pulmonary embolism. Normal heart size. No pericardial effusion. Mediastinum/Nodes: No enlarged mediastinal, hilar, or axillary lymph nodes. Thyroid gland, trachea, and esophagus demonstrate no significant findings. Lungs/Pleura: There is mild central lobular emphysema. Mild atelectatic changes are seen within the posterior aspects of the bilateral lung bases, right greater than left. There is no evidence of a pleural effusion or pneumothorax. Upper Abdomen: A 1.8 cm x 1.1 cm focus of parenchymal low attenuation is seen within the posterior aspect of the left lobe of the  liver. Musculoskeletal: Compression fracture deformities of indeterminate age are seen involving the T6, T7, T8 and T9 vertebral bodies. This is most severe at the level of T7, which represents a new finding when compared to the prior study. Evidence of prior vertebroplasty is noted at the levels of T5, T11, T12, L1 and L2. Review of the MIP images confirms the above findings. IMPRESSION: 1. No evidence of pulmonary embolus. 2. Mild central lobular emphysema. 3. Compression fracture deformities of indeterminate age involving the T6, T7, T8 and T9 vertebral bodies, most severe at the level of T7. 4. Evidence of prior vertebroplasty at the levels of T5, T11, T12, L1 and L2. Aortic Atherosclerosis (ICD10-I70.0) and Emphysema (ICD10-J43.9). Electronically Signed   By: Virgina Norfolk M.D.   On: 03/18/2021 04:00   US Venous Img Lower Bilateral (DVT)  Result Date: 03/19/2021 CLINICAL DATA:  Shortness of breath, leg pain EXAM: BILATERAL LOWER EXTREMITY VENOUS DOPPLER ULTRASOUND TECHNIQUE: Gray-scale sonography with compression, as well as color and duplex ultrasound, were performed to evaluate the deep venous system(s) from the level of the common femoral vein through the popliteal and proximal calf veins. COMPARISON:  None. FINDINGS: VENOUS Normal compressibility of BILATERAL common femoral, superficial femoral, and popliteal veins, as well as the visualized calf veins. Visualized portions of BILATERAL profunda femoral vein and great saphenous vein unremarkable. No filling defects to suggest DVT on grayscale or color Doppler imaging. Doppler waveforms show normal direction of venous flow, normal respiratory plasticity and response to augmentation. OTHER None. Limitations: none IMPRESSION: No evidence of deep venous thrombosis in either lower extremity. Electronically Signed   By: Lavonia Dana M.D.   On: 03/19/2021 09:59   DG Chest Port 1 View  Result Date: 03/27/2021 CLINICAL DATA:  3 atelectasis EXAM: PORTABLE  CHEST 1 VIEW COMPARISON:  03/24/2021 FINDINGS: Worsening opacity at the right base. Opacities hazy and streaky. Streaky density at the left base is well. Small right pleural effusion is likely. No pneumothorax. No cardiomegaly when accounting for mediastinal fat. Aortic tortuosity. Multilevel cement augmentation of the spine. IMPRESSION: Increased infiltrate or atelectasis at the lung bases. Electronically Signed   By: Jorje Guild M.D.   On: 03/27/2021 07:47  DG Chest Port 1 View  Result Date: 03/24/2021 CLINICAL DATA:  Difficulty breathing EXAM: PORTABLE CHEST 1 VIEW COMPARISON:  Previous studies including the examination of 03/21/2021 FINDINGS: Transverse diameter of heart is slightly increased. Thoracic aorta is tortuous and ectatic. There are small linear densities in the lower lung fields, more so on the left side with no significant interval change. There is no focal pulmonary consolidation. There is no significant pleural effusion or pneumothorax. IMPRESSION: Linear densities in the lower lung fields have not changed significantly. There are no new focal infiltrates or signs of pulmonary edema. Electronically Signed   By: Elmer Picker M.D.   On: 03/24/2021 09:50   DG Chest Port 1 View  Result Date: 03/21/2021 CLINICAL DATA:  Shortness of breath EXAM: PORTABLE CHEST 1 VIEW COMPARISON:  Previous studies including the examination of 03/17/2021 FINDINGS: Cardiac size is unremarkable. Thoracic aorta is tortuous and ectatic. There are no signs of pulmonary edema. There is crowding of markings in the lower lung fields with interval improvement. No new focal infiltrates are seen. There is no pleural effusion or pneumothorax. There is evidence of vertebroplasty in the multiple thoracic vertebral bodies IMPRESSION: There is interval improvement in aeration of lower lung fields suggesting resolving subsegmental atelectasis. There are no new infiltrates or signs of pulmonary edema. Electronically Signed    By: Elmer Picker M.D.   On: 03/21/2021 12:06   ECHOCARDIOGRAM COMPLETE  Result Date: 03/20/2021    ECHOCARDIOGRAM REPORT   Patient Name:   AIJA SCARFO Date of Exam: 03/20/2021 Medical Rec #:  [id number]     Height:       62.0 in Accession #:    [id number]    Weight:       139.0 lb Date of Birth:  26-Nov-1945     BSA:          1.638 m Patient Age:    64 years      BP:           128/78 mmHg Patient Gender: F             HR:           112 bpm. Exam Location:  [facility] Procedure: 2D Echo, Color Doppler and Cardiac Doppler Indications:     R06.00 Dyspnea  History:         Patient has no prior history of Echocardiogram examinations.                  COPD, Signs/Symptoms:Shortness of Breath; Risk                  Factors:Hypertension. Positive for influenza type A.  Sonographer:     Charmayne Sheer Referring Phys:  [id number] Barb Merino Diagnosing Phys: Isaias Cowman MD  Sonographer Comments: Suboptimal parasternal window and suboptimal subcostal window. Image acquisition challenging due to COPD. IMPRESSIONS  1. Left ventricular ejection fraction, by estimation, is 55 to 60%. The left ventricle has normal function. The left ventricle has no regional wall motion abnormalities. Left ventricular diastolic parameters are consistent with Grade I diastolic dysfunction (impaired relaxation).  2. Right ventricular systolic function is normal. The right ventricular size is normal.  3. The mitral valve is normal in structure. Mild mitral valve regurgitation. No evidence of mitral stenosis.  4. The aortic valve is normal in structure. Aortic valve regurgitation is not visualized. No aortic stenosis is present.  5. The inferior vena cava is normal in size with greater than 50%  respiratory variability, suggesting right atrial pressure of 3 mmHg. FINDINGS  Left Ventricle: Left ventricular ejection fraction, by estimation, is 55 to 60%. The left ventricle has normal function. The left ventricle has no regional wall motion  abnormalities. The left ventricular internal cavity size was normal in size. There is  no left ventricular hypertrophy. Left ventricular diastolic parameters are consistent with Grade I diastolic dysfunction (impaired relaxation). Right Ventricle: The right ventricular size is normal. No increase in right ventricular wall thickness. Right ventricular systolic function is normal. Left Atrium: Left atrial size was normal in size. Right Atrium: Right atrial size was normal in size. Pericardium: There is no evidence of pericardial effusion. Mitral Valve: The mitral valve is normal in structure. Mild mitral valve regurgitation. No evidence of mitral valve stenosis. MV peak gradient, 5.3 mmHg. The mean mitral valve gradient is 3.0 mmHg. Tricuspid Valve: The tricuspid valve is normal in structure. Tricuspid valve regurgitation is mild . No evidence of tricuspid stenosis. Aortic Valve: The aortic valve is normal in structure. Aortic valve regurgitation is not visualized. No aortic stenosis is present. Aortic valve mean gradient measures 4.0 mmHg. Aortic valve peak gradient measures 7.2 mmHg. Aortic valve area, by VTI measures 3.56 cm. Pulmonic Valve: The pulmonic valve was normal in structure. Pulmonic valve regurgitation is not visualized. No evidence of pulmonic stenosis. Aorta: The aortic root is normal in size and structure. Venous: The inferior vena cava is normal in size with greater than 50% respiratory variability, suggesting right atrial pressure of 3 mmHg. IAS/Shunts: No atrial level shunt detected by color flow Doppler.  LEFT VENTRICLE PLAX 2D LVOT diam:     2.20 cm     Diastology LV SV:         84          LV e' medial:    6.42 cm/s LV SV Index:   52          LV E/e' medial:  16.5 LVOT Area:     3.80 cm    LV e' lateral:   7.07 cm/s                            LV E/e' lateral: 15.0  LV Volumes (MOD) LV vol d, MOD A2C: 48.6 ml LV vol d, MOD A4C: 62.2 ml LV vol s, MOD A2C: 24.0 ml LV vol s, MOD A4C: 32.5 ml LV SV  MOD A2C:     24.6 ml LV SV MOD A4C:     62.2 ml LV SV MOD BP:      28.9 ml RIGHT VENTRICLE RV Basal diam:  3.16 cm LEFT ATRIUM             Index        RIGHT ATRIUM           Index LA Vol (A2C):   26.6 ml 16.24 ml/m  RA Area:     10.30 cm LA Vol (A4C):   23.0 ml 14.04 ml/m  RA Volume:   21.80 ml  13.31 ml/m LA Biplane Vol: 26.3 ml 16.06 ml/m  AORTIC VALVE                    PULMONIC VALVE AV Area (Vmax):    3.49 cm     PV Vmax:       1.20 m/s AV Area (Vmean):   3.31 cm     PV Vmean:  83.500 cm/s AV Area (VTI):     3.56 cm     PV VTI:        0.205 m AV Vmax:           134.00 cm/s  PV Peak grad:  5.8 mmHg AV Vmean:          98.100 cm/s  PV Mean grad:  3.0 mmHg AV VTI:            0.237 m AV Peak Grad:      7.2 mmHg AV Mean Grad:      4.0 mmHg LVOT Vmax:         123.00 cm/s LVOT Vmean:        85.500 cm/s LVOT VTI:          0.222 m LVOT/AV VTI ratio: 0.94  AORTA Ao Root diam: 2.80 cm MITRAL VALVE MV Area (PHT): 7.09 cm     SHUNTS MV Area VTI:   5.62 cm     Systemic VTI:  0.22 m MV Peak grad:  5.3 mmHg     Systemic Diam: 2.20 cm MV Mean grad:  3.0 mmHg MV Vmax:       1.15 m/s MV Vmean:      77.5 cm/s MV Decel Time: 107 msec MV E velocity: 106.00 cm/s MV A velocity: 113.00 cm/s MV E/A ratio:  0.94 Isaias Cowman MD Electronically signed by Isaias Cowman MD Signature Date/Time: 03/20/2021/1:43:48 PM    Final           ASSESSMENT/PLAN   Acute hypoxemic respiratory failure   Due to concomitant influenza A infection with COPD exacerbation and atelectasis with mild effusion of right pleural space.      - patient would benefit from pulmonary optimization  -c/w zithromax/rocepin>>>Doxycycline -he may be developing bacterial pneumonia, will obtain procalcitonin and start doxycycline -Arterial blood gas after weaning to 2L/min today please    Influenza A infetion   - Tamiflu   - stopped Tessalon to allow expectoration    - Dcd Tussinex hinders respiratory effort   - dcd pulmicort -  patient on IV steroids                03/24/21-dcd steroids   Mild pulmonary edema  - lasix 20 IV bid   Acute on Chronic COPD    - continue Duoneb    - patient has allergy to Trelegy and Anoro    - continue steriods - reduced solumedrol to 40 once daily IV>>20>>dcd     - IS and flutter are at bedside   Bibasilar atelectasis    Patient would benefit from aggressive recruitment maneuvers -PT/OT should be daily    Thank you for allowing me to participate in the care of this patient.  Total face to face encounter time for this patient visit was >45 min. >50% of the time was  spent in counseling and coordination of care.   Patient/Family are satisfied with care plan and all questions have been answered.  This document was prepared using Dragon voice recognition software and may include unintentional dictation errors.     Ottie Glazier, M.D.  Division of [facility]

## 2021-03-27 NOTE — Progress Notes (Addendum)
PROGRESS NOTE    Ashley Mack  EHM:[id number] DOB: 11-Oct-1945 DOA: 03/18/2021 PCP: Kirk Ruths, MD    Assessment & Plan:   Principal Problem:   COPD exacerbation [facility]) Active Problems:   COPD with acute exacerbation ([place])   COPD exacerbation: likely secondary to influenza A. Completed tamiflu course. Still short of breath but slightly improved from day prior. Continue on steroids, bronchodilators & encourage incentive spirometry. Pulmon following and recs apprec   Pulmonary edema: continue on IV lasix. Monitor I/Os.   Acute hypoxic respiratory failure: likely secondary to above. Continue on supplemental oxygen and wean as tolerated, currently on 4L [place]. Will likely have to d/c home w/ oxygen   Leukocytosis: labile. Likely secondary to steroid use   Thrombocytosis: etiology unclear. Will continue to monitor   Hyponatremia: labile. Will continue to monitor   AKI: Cr is labile. Likely secondary to lasix use.   Hypokalemia: KCl given    DVT prophylaxis: lovenox  Code Status: DNR Family Communication:  Disposition Plan: likely d/c home w/ HH  Level of care: Med-Surg  Status is: Inpatient  Remains inpatient appropriate because: still requiring supplemental oxygen & still SOB, 4L [place]       Consultants:  Pulmon   Procedures:   Antimicrobials:   Subjective: Pt c/o difficulty breathing but slightly improved from day prior.   Objective: Vitals:   03/27/21 0853 03/27/21 1025 03/27/21 1122 03/27/21 1217  BP: (!) 134/51 (!) 117/53 (!) 126/41 114/65  Pulse: (!) 107 (!) 110 (!) 110 (!) 102  Resp:    20  Temp: 98.3 F (36.8 C)   98.4 F (36.9 C)  TempSrc: Oral   Oral  SpO2: 96% 99% 98% 90%  Weight:      Height:        Intake/Output Summary (Last 24 hours) at 03/27/2021 1319 Last data filed at 03/26/2021 1409 Gross per 24 hour  Intake 360 ml  Output 350 ml  Net 10 ml   Filed Weights   03/17/21 1444 03/19/21 2148  Weight: 63.5 kg 63 kg     Examination:  General exam: Appears comfortable  Respiratory system: course breath sounds b/l  Cardiovascular system: S1/S2+. No rubs or gallops   Gastrointestinal system: Abd is soft, NT, ND & hypoactive bowel sounds  Central nervous system: Alert and oriented. Moves all extremities  Psychiatry: judgement and insight appear normal. Appropriate mood and affect    Data Reviewed: I have personally reviewed following labs and imaging studies  CBC: Recent Labs  Lab 03/22/21 0310 03/24/21 0913 03/26/21 0845 03/27/21 0848  WBC 20.6* 19.0* 22.9* 20.6*  NEUTROABS 19.0* 16.0*  --   --   HGB 11.5* 12.1 12.0 10.9*  HCT 35.8* 37.8 36.4 33.8*  MCV 84.8 85.5 83.9 83.3  PLT 414* 409* 344 366*   Basic Metabolic Panel: Recent Labs  Lab 03/24/21 0913 03/26/21 2115 03/27/21 0848  NA 134* 130* 133*  K 3.6 3.1* 3.3*  CL 94* 87* 87*  CO2 34* 34* 35*  GLUCOSE 118* 141* 111*  BUN 30* 35* 36*  CREATININE 0.64 1.18* 1.07*  CALCIUM 8.4* 8.4* 8.6*   GFR: Estimated Creatinine Clearance: 38.4 mL/min (A) (by C-G formula based on SCr of 1.07 mg/dL (H)). Liver Function Tests: Recent Labs  Lab 03/24/21 0913  AST 18  ALT 21  ALKPHOS 64  BILITOT 0.5  PROT 6.0*  ALBUMIN 3.0*   No results for input(s): LIPASE, AMYLASE in the last 168 hours. No results for input(s):  AMMONIA in the last 168 hours. Coagulation Profile: No results for input(s): INR, PROTIME in the last 168 hours. Cardiac Enzymes: No results for input(s): CKTOTAL, CKMB, CKMBINDEX, TROPONINI in the last 168 hours. BNP (last 3 results) No results for input(s): PROBNP in the last 8760 hours. HbA1C: No results for input(s): HGBA1C in the last 72 hours. CBG: No results for input(s): GLUCAP in the last 168 hours. Lipid Profile: No results for input(s): CHOL, HDL, LDLCALC, TRIG, CHOLHDL, LDLDIRECT in the last 72 hours. Thyroid Function Tests: No results for input(s): TSH, T4TOTAL, FREET4, T3FREE, THYROIDAB in the last 72  hours. Anemia Panel: No results for input(s): VITAMINB12, FOLATE, FERRITIN, TIBC, IRON, RETICCTPCT in the last 72 hours. Sepsis Labs: No results for input(s): PROCALCITON, LATICACIDVEN in the last 168 hours.  Recent Results (from the past 240 hour(s))  Resp Panel by RT-PCR (Flu A&B, Covid) Nasopharyngeal Swab     Status: Abnormal   Collection Time: 03/17/21  2:55 PM   Specimen: Nasopharyngeal Swab; Nasopharyngeal(NP) swabs in vial transport medium  Result Value Ref Range Status   SARS Coronavirus 2 by RT PCR NEGATIVE NEGATIVE Final    Comment: (NOTE) SARS-CoV-2 target nucleic acids are NOT DETECTED.  The SARS-CoV-2 RNA is generally detectable in upper respiratory specimens during the acute phase of infection. The lowest concentration of SARS-CoV-2 viral copies this assay can detect is 138 copies/mL. A negative result does not preclude SARS-Cov-2 infection and should not be used as the sole basis for treatment or other patient management decisions. A negative result may occur with  improper specimen collection/handling, submission of specimen other than nasopharyngeal swab, presence of viral mutation(s) within the areas targeted by this assay, and inadequate number of viral copies(<138 copies/mL). A negative result must be combined with clinical observations, patient history, and epidemiological information. The expected result is Negative.  Fact Sheet for Patients:  EntrepreneurPulse.com.au  Fact Sheet for Healthcare Providers:  IncredibleEmployment.be  This test is no t yet approved or cleared by the [place] FDA and  has been authorized for detection and/or diagnosis of SARS-CoV-2 by FDA under an Emergency Use Authorization (EUA). This EUA will remain  in effect (meaning this test can be used) for the duration of the COVID-19 declaration under Section 564(b)(1) of the Act, 21 U.S.C.section 360bbb-3(b)(1), unless the authorization is  terminated  or revoked sooner.       Influenza A by PCR POSITIVE (A) NEGATIVE Final   Influenza B by PCR NEGATIVE NEGATIVE Final    Comment: (NOTE) The Xpert Xpress SARS-CoV-2/FLU/RSV plus assay is intended as an aid in the diagnosis of influenza from Nasopharyngeal swab specimens and should not be used as a sole basis for treatment. Nasal washings and aspirates are unacceptable for Xpert Xpress SARS-CoV-2/FLU/RSV testing.  Fact Sheet for Patients: EntrepreneurPulse.com.au  Fact Sheet for Healthcare Providers: IncredibleEmployment.be  This test is not yet approved or cleared by the [place] FDA and has been authorized for detection and/or diagnosis of SARS-CoV-2 by FDA under an Emergency Use Authorization (EUA). This EUA will remain in effect (meaning this test can be used) for the duration of the COVID-19 declaration under Section 564(b)(1) of the Act, 21 U.S.C. section 360bbb-3(b)(1), unless the authorization is terminated or revoked.  Performed at [facility], [address] [place], [place] [address]          Radiology Studies: [facility] Chest [place] 1 View  Result Date: 03/27/2021 CLINICAL DATA:  3 atelectasis EXAM: PORTABLE CHEST 1 VIEW COMPARISON:  03/24/2021 FINDINGS: Worsening opacity at the right base. Opacities hazy and streaky. Streaky density at the left base is well. Small right pleural effusion is likely. No pneumothorax. No cardiomegaly when accounting for mediastinal fat. Aortic tortuosity. Multilevel cement augmentation of the spine. IMPRESSION: Increased infiltrate or atelectasis at the lung bases. Electronically Signed   By: Jorje Guild M.D.   On: 03/27/2021 07:47        Scheduled Meds:  cholecalciferol  2,000 Units Oral Daily   [START ON 03/31/2021] cyanocobalamin  1,000 mcg Intramuscular Q30 days   diclofenac Sodium  4 g Topical QID   enoxaparin (LOVENOX) injection  40 mg Subcutaneous Daily    furosemide  20 mg Intravenous BID   guaiFENesin  600 mg Oral BID   ipratropium-albuterol  3 mL Nebulization TID   multivitamin with minerals  1 tablet Oral Daily   pantoprazole  40 mg Oral Daily   predniSONE  5 mg Oral Q breakfast   Continuous Infusions:   LOS: 9 days    Time spent: 20 mins     Wyvonnia Dusky, MD [facility] Pager [phone]  If 7PM-7AM, please contact night-coverage 03/27/2021, 1:19 PM

## 2021-03-27 NOTE — TOC Progression Note (Signed)
Transition of Care [facility]) - Progression Note    Patient Details  Name: Ashley Mack MRN: [id number] Date of Birth: [birth year]  Transition of [facility]) CM/SW Contact  Eileen Stanford, LCSW Phone Number: 03/27/2021, 12:50 PM  Clinical Narrative:   Pt refused RW and BSC.    Expected Discharge Plan: [place] Barriers to Discharge: Continued Medical Work up  Expected Discharge Plan and Services Expected Discharge Plan: [place] In-house Referral: NA   Post Acute Care Choice: [place] arrangements for the past 2 months: Single Family Home                                       Social Determinants of Health (SDOH) Interventions    Readmission Risk Interventions No flowsheet data found.

## 2021-03-28 LAB — BASIC METABOLIC PANEL
Anion gap: 10 (ref 5–15)
BUN: 31 mg/dL — ABNORMAL HIGH (ref 8–23)
CO2: 34 mmol/L — ABNORMAL HIGH (ref 22–32)
Calcium: 8.4 mg/dL — ABNORMAL LOW (ref 8.9–10.3)
Chloride: 92 mmol/L — ABNORMAL LOW (ref 98–111)
Creatinine, Ser: 0.92 mg/dL (ref 0.44–1.00)
GFR, Estimated: >60 mL/min (ref >60)
Glucose, Bld: 123 mg/dL — ABNORMAL HIGH (ref 70–99)
Potassium: 3.7 mmol/L (ref 3.5–5.1)
Sodium: 136 mmol/L (ref 135–145)

## 2021-03-28 LAB — CBC
HCT: 30.9 % — ABNORMAL LOW (ref 36.0–46.0)
Hemoglobin: 10.1 g/dL — ABNORMAL LOW (ref 12.0–15.0)
MCH: 27.8 pg (ref 26.0–34.0)
MCHC: 32.7 g/dL (ref 30.0–36.0)
MCV: 85.1 fL (ref 80.0–100.0)
Platelets: 397 10*3/uL (ref 150–400)
RBC: 3.63 MIL/uL — ABNORMAL LOW (ref 3.87–5.11)
RDW: 16 % — ABNORMAL HIGH (ref 11.5–15.5)
WBC: 14.6 10*3/uL — ABNORMAL HIGH (ref 4.0–10.5)
nRBC: 0 % (ref 0.0–0.2)

## 2021-03-28 NOTE — Progress Notes (Signed)
°   03/27/21 1700  Assess: MEWS Score  BP 121/69  Pulse Rate (!) 114  Assess: MEWS Score  MEWS Temp 0  MEWS Systolic 0  MEWS Pulse 2  MEWS RR 0  MEWS LOC 0  MEWS Score 2  MEWS Score Color Yellow  Assess: if the MEWS score is Yellow or Red  Were vital signs taken at a resting state? Yes  Focused Assessment No change from prior assessment  Does the patient meet 2 or more of the SIRS criteria? No  Does the patient have a confirmed or suspected source of infection? No  Provider and Rapid Response Notified? No (tachycardia)  MEWS guidelines implemented *See Row Information* Yes  Treat  MEWS Interventions Escalated (See documentation below)  Pain Scale 0-10  Pain Score 0  Take Vital Signs  Increase Vital Sign Frequency  Yellow: Q 2hr X 2 then Q 4hr X 2, if remains yellow, continue Q 4hrs  Escalate  MEWS: Escalate Yellow: discuss with charge nurse/RN and consider discussing with provider and RRT  Notify: Charge Nurse/RN  Name of Charge Nurse/RN Notified Olivia (notified by student, this was not student's patient was taking VS for charge nurse)  Date Charge Nurse/RN Notified 03/27/21  Time Charge Nurse/RN Notified 1755  Assess: SIRS CRITERIA  SIRS Temperature  0  SIRS Pulse 1  SIRS Respirations  0  SIRS WBC 1  SIRS Score Sum  2

## 2021-03-28 NOTE — Progress Notes (Signed)
PROGRESS NOTE    Ashley Mack  ZWC:[id number] DOB: July 05, 1945 DOA: 03/18/2021 PCP: Kirk Ruths, MD    Assessment & Plan:   Principal Problem:   COPD exacerbation [facility]) Active Problems:   COPD with acute exacerbation ([place])   COPD exacerbation: likely secondary to influenza A. Completed tamiflu course. Still short of breath today. CXR shows increased infiltrate. Continue on IV doxycycline as per pulmon. Continue on steroids, bronchodilators & encourage incentive spirometry. Pulmon recs apprec   Pulmonary edema: continue on IV lasix. Monitor I/Os   Acute hypoxic respiratory failure: likely secondary to above. Continue on supplemental oxygen and wean as tolerated. Will likely need to d/c home w/ supplemental oxygen   Leukocytosis: likely secondary to steroid use.   Thrombocytosis: resolved   Hyponatremia: resolved   AKI: Cr is trending down today.   Hypokalemia: WNL today    DVT prophylaxis: lovenox  Code Status: DNR Family Communication: discussed pt's care w/ pt's daughter, Museum/gallery conservator, and answered her questions  Disposition Plan: likely d/c home w/ HH  Level of care: Med-Surg  Status is: Inpatient  Remains inpatient appropriate because: still requiring supplemental oxygen and still short of breath      Consultants:  Pulmon   Procedures:   Antimicrobials:   Subjective: Pt c/o shortness of breath   Objective: Vitals:   03/27/21 2010 03/28/21 0621 03/28/21 0722 03/28/21 0815  BP:  117/64  132/70  Pulse:  100  (!) 105  Resp:  20  20  Temp:  98.6 F (37 C)  97.9 F (36.6 C)  TempSrc:  Oral    SpO2: 92% 96% 95% 95%  Weight:      Height:        Intake/Output Summary (Last 24 hours) at 03/28/2021 1041 Last data filed at 03/28/2021 0900 Gross per 24 hour  Intake 150 ml  Output 600 ml  Net -450 ml   Filed Weights   03/17/21 1444 03/19/21 2148  Weight: 63.5 kg 63 kg    Examination:  General exam: Appears uncomfortable  Respiratory system:  diminished breath sounds b/l Cardiovascular system: S1 & S2+. No rubs or clicks  Gastrointestinal system: Abd is soft, NT, ND & normal bowel sounds  Central nervous system: Alert and oriented. Moves all extremities  Psychiatry: judgement and insight appears normal. Flat mood and affect     Data Reviewed: I have personally reviewed following labs and imaging studies  CBC: Recent Labs  Lab 03/22/21 0310 03/24/21 0913 03/26/21 0845 03/27/21 0848 03/28/21 0533  WBC 20.6* 19.0* 22.9* 20.6* 14.6*  NEUTROABS 19.0* 16.0*  --   --   --   HGB 11.5* 12.1 12.0 10.9* 10.1*  HCT 35.8* 37.8 36.4 33.8* 30.9*  MCV 84.8 85.5 83.9 83.3 85.1  PLT 414* 409* 344 419* 235   Basic Metabolic Panel: Recent Labs  Lab 03/24/21 0913 03/26/21 2115 03/27/21 0848 03/28/21 0533  NA 134* 130* 133* 136  K 3.6 3.1* 3.3* 3.7  CL 94* 87* 87* 92*  CO2 34* 34* 35* 34*  GLUCOSE 118* 141* 111* 123*  BUN 30* 35* 36* 31*  CREATININE 0.64 1.18* 1.07* 0.92  CALCIUM 8.4* 8.4* 8.6* 8.4*   GFR: Estimated Creatinine Clearance: 44.7 mL/min (by C-G formula based on SCr of 0.92 mg/dL). Liver Function Tests: Recent Labs  Lab 03/24/21 0913  AST 18  ALT 21  ALKPHOS 64  BILITOT 0.5  PROT 6.0*  ALBUMIN 3.0*   No results for input(s): LIPASE, AMYLASE in the last  168 hours. No results for input(s): AMMONIA in the last 168 hours. Coagulation Profile: No results for input(s): INR, PROTIME in the last 168 hours. Cardiac Enzymes: No results for input(s): CKTOTAL, CKMB, CKMBINDEX, TROPONINI in the last 168 hours. BNP (last 3 results) No results for input(s): PROBNP in the last 8760 hours. HbA1C: No results for input(s): HGBA1C in the last 72 hours. CBG: No results for input(s): GLUCAP in the last 168 hours. Lipid Profile: No results for input(s): CHOL, HDL, LDLCALC, TRIG, CHOLHDL, LDLDIRECT in the last 72 hours. Thyroid Function Tests: No results for input(s): TSH, T4TOTAL, FREET4, T3FREE, THYROIDAB in the last  72 hours. Anemia Panel: No results for input(s): VITAMINB12, FOLATE, FERRITIN, TIBC, IRON, RETICCTPCT in the last 72 hours. Sepsis Labs: Recent Labs  Lab 03/27/21 0848  PROCALCITON 0.33    No results found for this or any previous visit (from the past 240 hour(s)).        Radiology Studies: DG Chest Port 1 View  Result Date: 03/27/2021 CLINICAL DATA:  3 atelectasis EXAM: PORTABLE CHEST 1 VIEW COMPARISON:  03/24/2021 FINDINGS: Worsening opacity at the right base. Opacities hazy and streaky. Streaky density at the left base is well. Small right pleural effusion is likely. No pneumothorax. No cardiomegaly when accounting for mediastinal fat. Aortic tortuosity. Multilevel cement augmentation of the spine. IMPRESSION: Increased infiltrate or atelectasis at the lung bases. Electronically Signed   By: Jorje Guild M.D.   On: 03/27/2021 07:47        Scheduled Meds:  cholecalciferol  2,000 Units Oral Daily   [START ON 03/31/2021] cyanocobalamin  1,000 mcg Intramuscular Q30 days   diclofenac Sodium  4 g Topical QID   enoxaparin (LOVENOX) injection  40 mg Subcutaneous Daily   furosemide  20 mg Intravenous BID   guaiFENesin  600 mg Oral BID   ipratropium-albuterol  3 mL Nebulization TID   multivitamin with minerals  1 tablet Oral Daily   pantoprazole  40 mg Oral Daily   predniSONE  5 mg Oral Q breakfast   Continuous Infusions:  doxycycline (VIBRAMYCIN) IV 100 mg (03/28/21 0404)     LOS: 10 days    Time spent: 15 mins     Wyvonnia Dusky, MD [facility] Pager [phone]  If 7PM-7AM, please contact night-coverage 03/28/2021, 10:41 AM

## 2021-03-29 DIAGNOSIS — D75839 Thrombocytosis, unspecified: Secondary | ICD-10-CM

## 2021-03-29 LAB — BASIC METABOLIC PANEL
Anion gap: 9 (ref 5–15)
BUN: 23 mg/dL (ref 8–23)
CO2: 35 mmol/L — ABNORMAL HIGH (ref 22–32)
Calcium: 8.8 mg/dL — ABNORMAL LOW (ref 8.9–10.3)
Chloride: 91 mmol/L — ABNORMAL LOW (ref 98–111)
Creatinine, Ser: 0.78 mg/dL (ref 0.44–1.00)
GFR, Estimated: >60 mL/min (ref >60)
Glucose, Bld: 118 mg/dL — ABNORMAL HIGH (ref 70–99)
Potassium: 3.2 mmol/L — ABNORMAL LOW (ref 3.5–5.1)
Sodium: 135 mmol/L (ref 135–145)

## 2021-03-29 LAB — CBC
HCT: 30.7 % — ABNORMAL LOW (ref 36.0–46.0)
Hemoglobin: 9.9 g/dL — ABNORMAL LOW (ref 12.0–15.0)
MCH: 26.8 pg (ref 26.0–34.0)
MCHC: 32.2 g/dL (ref 30.0–36.0)
MCV: 83 fL (ref 80.0–100.0)
Platelets: 436 10*3/uL — ABNORMAL HIGH (ref 150–400)
RBC: 3.7 MIL/uL — ABNORMAL LOW (ref 3.87–5.11)
RDW: 16.2 % — ABNORMAL HIGH (ref 11.5–15.5)
WBC: 10.2 10*3/uL (ref 4.0–10.5)
nRBC: 0 % (ref 0.0–0.2)

## 2021-03-29 MED ORDER — POTASSIUM CHLORIDE CRYS ER 20 MEQ PO TBCR
40.0000 meq | EXTENDED_RELEASE_TABLET | Freq: Once | ORAL | Status: AC
  Administered 2021-03-29: 40 meq via ORAL
  Filled 2021-03-29: qty 2

## 2021-03-29 MED ORDER — DOXYCYCLINE HYCLATE 100 MG PO TABS
100.0000 mg | ORAL_TABLET | Freq: Two times a day (BID) | ORAL | Status: DC
  Administered 2021-03-29 – 2021-04-01 (×7): 100 mg via ORAL
  Filled 2021-03-29 (×7): qty 1

## 2021-03-29 MED ORDER — SODIUM CHLORIDE 0.9 % IV SOLN: INTRAVENOUS | Status: DC | PRN

## 2021-03-29 MED ORDER — FUROSEMIDE 20 MG PO TABS
20.0000 mg | ORAL_TABLET | Freq: Two times a day (BID) | ORAL | Status: DC
  Administered 2021-03-29 – 2021-03-31 (×6): 20 mg via ORAL
  Filled 2021-03-29 (×6): qty 1

## 2021-03-29 NOTE — Progress Notes (Signed)
Per Dr Jimmye Norman, ok to dc tele monitoring

## 2021-03-29 NOTE — Progress Notes (Signed)
A consult was placed to the IV Therapist to gain new iv access for the pt;  both arms assessed thoroughly with ultrasound; very poor access noted;  veins are very small and bifurcate;  attempted x 1 in RUA to place a midline but was unable to thread the guidewire; had staff assisting in holding the pt's arm; her skin is very fragile due to "years of prednisone."  No further attempts made; RN has notified the Dr.

## 2021-03-29 NOTE — Progress Notes (Signed)
PROGRESS NOTE    Ashley Mack  JYN:[id number] DOB: 08-21-1945 DOA: 03/18/2021 PCP: Kirk Ruths, MD    Assessment & Plan:   Principal Problem:   COPD exacerbation [facility]) Active Problems:   COPD with acute exacerbation ([place])   COPD exacerbation: likely secondary to influenza A. Completed tamiflu course. Improved respiratory status from day prior. CXR shows increased infiltrate. Changed abx to po doxycycline as nurse/IV team unable to get the pt another IV as the previous one stopped working. Continue on steroids, bronchodilators & encourage incentive spirometry. Pulmon recs apprec   Pulmonary edema: lasix changed to po as nurse/IV team unable to get the pt another IV. Monitor I/Os  Acute hypoxic respiratory failure: likely secondary to above. Continue on supplemental oxygen and wean as tolerated. Will likely need to d/c home w/ supplemental oxygen   Leukocytosis: likely secondary to steroid use.   Thrombocytosis: etiology unclear, labile. Will continue to monitor   Hyponatremia: resolved   AKI:  resolved    Hypokalemia: potassium given    DVT prophylaxis: lovenox  Code Status: DNR Family Communication:  Disposition Plan: likely d/c home. Pt is unsure if she wants HH or not   Level of care: Med-Surg  Status is: Inpatient  Remains inpatient appropriate because: severity of illness, improvement of respiratory status      Consultants:  Pulmon   Procedures:   Antimicrobials:   Subjective: Pt c/o malaise   Objective: Vitals:   03/28/21 1342 03/28/21 1944 03/28/21 2037 03/29/21 0440  BP:  138/78  (!) 142/70  Pulse:  (!) 110  (!) 104  Resp:  18  20  Temp:  98.3 F (36.8 C)  98.1 F (36.7 C)  TempSrc:    Oral  SpO2: 95% 94% 94% 94%  Weight:      Height:        Intake/Output Summary (Last 24 hours) at 03/29/2021 0730 Last data filed at 03/29/2021 0650 Gross per 24 hour  Intake 1021.41 ml  Output 400 ml  Net 621.41 ml   Filed Weights   03/17/21  1444 03/19/21 2148  Weight: 63.5 kg 63 kg    Examination:  General exam: Appears calm & comfortable  Respiratory system: decreased breath sounds b/l  Cardiovascular system: S1/S2+. No rubs or clicks  Gastrointestinal system: Abd is soft, NT, ND & normal bowel sounds  Central nervous system: Alert and oriented. Moves all extremities  Psychiatry:judgement and insight appear normal. Flat mood and mood      Data Reviewed: I have personally reviewed following labs and imaging studies  CBC: Recent Labs  Lab 03/24/21 0913 03/26/21 0845 03/27/21 0848 03/28/21 0533 03/29/21 0501  WBC 19.0* 22.9* 20.6* 14.6* 10.2  NEUTROABS 16.0*  --   --   --   --   HGB 12.1 12.0 10.9* 10.1* 9.9*  HCT 37.8 36.4 33.8* 30.9* 30.7*  MCV 85.5 83.9 83.3 85.1 83.0  PLT 409* 344 419* 397 865*   Basic Metabolic Panel: Recent Labs  Lab 03/24/21 0913 03/26/21 2115 03/27/21 0848 03/28/21 0533 03/29/21 0501  NA 134* 130* 133* 136 135  K 3.6 3.1* 3.3* 3.7 3.2*  CL 94* 87* 87* 92* 91*  CO2 34* 34* 35* 34* 35*  GLUCOSE 118* 141* 111* 123* 118*  BUN 30* 35* 36* 31* 23  CREATININE 0.64 1.18* 1.07* 0.92 0.78  CALCIUM 8.4* 8.4* 8.6* 8.4* 8.8*   GFR: Estimated Creatinine Clearance: 51.4 mL/min (by C-G formula based on SCr of 0.78 mg/dL). Liver Function  Tests: Recent Labs  Lab 03/24/21 0913  AST 18  ALT 21  ALKPHOS 64  BILITOT 0.5  PROT 6.0*  ALBUMIN 3.0*   No results for input(s): LIPASE, AMYLASE in the last 168 hours. No results for input(s): AMMONIA in the last 168 hours. Coagulation Profile: No results for input(s): INR, PROTIME in the last 168 hours. Cardiac Enzymes: No results for input(s): CKTOTAL, CKMB, CKMBINDEX, TROPONINI in the last 168 hours. BNP (last 3 results) No results for input(s): PROBNP in the last 8760 hours. HbA1C: No results for input(s): HGBA1C in the last 72 hours. CBG: No results for input(s): GLUCAP in the last 168 hours. Lipid Profile: No results for input(s):  CHOL, HDL, LDLCALC, TRIG, CHOLHDL, LDLDIRECT in the last 72 hours. Thyroid Function Tests: No results for input(s): TSH, T4TOTAL, FREET4, T3FREE, THYROIDAB in the last 72 hours. Anemia Panel: No results for input(s): VITAMINB12, FOLATE, FERRITIN, TIBC, IRON, RETICCTPCT in the last 72 hours. Sepsis Labs: Recent Labs  Lab 03/27/21 0848  PROCALCITON 0.33    No results found for this or any previous visit (from the past 240 hour(s)).        Radiology Studies: No results found.      Scheduled Meds:  cholecalciferol  2,000 Units Oral Daily   [START ON 03/31/2021] cyanocobalamin  1,000 mcg Intramuscular Q30 days   diclofenac Sodium  4 g Topical QID   enoxaparin (LOVENOX) injection  40 mg Subcutaneous Daily   furosemide  20 mg Intravenous BID   guaiFENesin  600 mg Oral BID   ipratropium-albuterol  3 mL Nebulization TID   multivitamin with minerals  1 tablet Oral Daily   pantoprazole  40 mg Oral Daily   predniSONE  5 mg Oral Q breakfast   Continuous Infusions:  sodium chloride Stopped (03/29/21 0649)   doxycycline (VIBRAMYCIN) IV 125 mL/hr at 03/29/21 0650     LOS: 11 days    Time spent: 15 mins     Wyvonnia Dusky, MD [facility] Pager [phone]  If 7PM-7AM, please contact night-coverage 03/29/2021, 7:30 AM

## 2021-03-30 LAB — BASIC METABOLIC PANEL
Anion gap: 9 (ref 5–15)
BUN: 17 mg/dL (ref 8–23)
CO2: 33 mmol/L — ABNORMAL HIGH (ref 22–32)
Calcium: 8.9 mg/dL (ref 8.9–10.3)
Chloride: 92 mmol/L — ABNORMAL LOW (ref 98–111)
Creatinine, Ser: 0.93 mg/dL (ref 0.44–1.00)
GFR, Estimated: >60 mL/min (ref >60)
Glucose, Bld: 107 mg/dL — ABNORMAL HIGH (ref 70–99)
Potassium: 3.5 mmol/L (ref 3.5–5.1)
Sodium: 134 mmol/L — ABNORMAL LOW (ref 135–145)

## 2021-03-30 LAB — CBC
HCT: 31.5 % — ABNORMAL LOW (ref 36.0–46.0)
Hemoglobin: 10.2 g/dL — ABNORMAL LOW (ref 12.0–15.0)
MCH: 27.3 pg (ref 26.0–34.0)
MCHC: 32.4 g/dL (ref 30.0–36.0)
MCV: 84.5 fL (ref 80.0–100.0)
Platelets: 467 10*3/uL — ABNORMAL HIGH (ref 150–400)
RBC: 3.73 MIL/uL — ABNORMAL LOW (ref 3.87–5.11)
RDW: 16.1 % — ABNORMAL HIGH (ref 11.5–15.5)
WBC: 9.4 10*3/uL (ref 4.0–10.5)
nRBC: 0 % (ref 0.0–0.2)

## 2021-03-30 NOTE — Progress Notes (Signed)
PROGRESS NOTE    Ashley Mack  EVO:[id number] DOB: 1945/09/12 DOA: 03/18/2021 PCP: Kirk Ruths, MD    Assessment & Plan:   Principal Problem:   COPD exacerbation [facility]) Active Problems:   COPD with acute exacerbation ([place])   COPD exacerbation: likely secondary to influenza A. Completed tamiflu course. Improved shortness of breath today.  Continue on doxycycline, steroids, bronchodilators and encourage incentive spirometry. Pulmon recs apprec   Pulmonary edema: continue on po lasix. (Nurse/IV team unable to get another IV placed).  Monitor I/Os.  Acute hypoxic respiratory failure: likely secondary to above. Continue on supplemental oxygen and wean as tolerated. Will likely need to d/c home w/ supplemental oxygen   Leukocytosis: resolved  Thrombocytosis: labile, etiology unclear. Will continue to monitor   Hyponatremia: labile, almost WNL   AKI:  resolved    Hypokalemia: WNL today    DVT prophylaxis: lovenox  Code Status: DNR Family Communication:  Disposition Plan: likely d/c home. Pt is unsure if she wants HH or not   Level of care: Med-Surg  Status is: Inpatient  Remains inpatient appropriate because: severity of illness, improvement of respiratory status      Consultants:  Pulmon   Procedures:   Antimicrobials:   Subjective: Pt c/o fatigue    Objective: Vitals:   03/29/21 2015 03/29/21 2039 03/30/21 0419 03/30/21 0722  BP: (!) 144/76  (!) 110/56   Pulse: (!) 114  (!) 108 (!) 113  Resp: 20  20 18   Temp: 98.1 F (36.7 C)  98.7 F (37.1 C)   TempSrc: Oral  Oral   SpO2: 98% 98% 98% 90%  Weight:      Height:        Intake/Output Summary (Last 24 hours) at 03/30/2021 0737 Last data filed at 03/30/2021 0500 Gross per 24 hour  Intake --  Output 300 ml  Net -300 ml   Filed Weights   03/17/21 1444 03/19/21 2148  Weight: 63.5 kg 63 kg    Examination:  General exam: Appears comfortable  Respiratory system: diminished breath sounds b/l   Cardiovascular system: S1 & S2+. No rubs or clicks  Gastrointestinal system: Abd is soft, NT, ND & hypoactive bowel sounds Central nervous system: Alert and oriented. Moves all extremities  Psychiatry: judgement and insight appears normal. Flat mood and affect     Data Reviewed: I have personally reviewed following labs and imaging studies  CBC: Recent Labs  Lab 03/24/21 0913 03/26/21 0845 03/27/21 0848 03/28/21 0533 03/29/21 0501 03/30/21 0447  WBC 19.0* 22.9* 20.6* 14.6* 10.2 9.4  NEUTROABS 16.0*  --   --   --   --   --   HGB 12.1 12.0 10.9* 10.1* 9.9* 10.2*  HCT 37.8 36.4 33.8* 30.9* 30.7* 31.5*  MCV 85.5 83.9 83.3 85.1 83.0 84.5  PLT 409* 344 419* 397 436* 299*   Basic Metabolic Panel: Recent Labs  Lab 03/26/21 2115 03/27/21 0848 03/28/21 0533 03/29/21 0501 03/30/21 0447  NA 130* 133* 136 135 134*  K 3.1* 3.3* 3.7 3.2* 3.5  CL 87* 87* 92* 91* 92*  CO2 34* 35* 34* 35* 33*  GLUCOSE 141* 111* 123* 118* 107*  BUN 35* 36* 31* 23 17  CREATININE 1.18* 1.07* 0.92 0.78 0.93  CALCIUM 8.4* 8.6* 8.4* 8.8* 8.9   GFR: Estimated Creatinine Clearance: 44.2 mL/min (by C-G formula based on SCr of 0.93 mg/dL). Liver Function Tests: Recent Labs  Lab 03/24/21 0913  AST 18  ALT 21  ALKPHOS 64  BILITOT 0.5  PROT 6.0*  ALBUMIN 3.0*   No results for input(s): LIPASE, AMYLASE in the last 168 hours. No results for input(s): AMMONIA in the last 168 hours. Coagulation Profile: No results for input(s): INR, PROTIME in the last 168 hours. Cardiac Enzymes: No results for input(s): CKTOTAL, CKMB, CKMBINDEX, TROPONINI in the last 168 hours. BNP (last 3 results) No results for input(s): PROBNP in the last 8760 hours. HbA1C: No results for input(s): HGBA1C in the last 72 hours. CBG: No results for input(s): GLUCAP in the last 168 hours. Lipid Profile: No results for input(s): CHOL, HDL, LDLCALC, TRIG, CHOLHDL, LDLDIRECT in the last 72 hours. Thyroid Function Tests: No results  for input(s): TSH, T4TOTAL, FREET4, T3FREE, THYROIDAB in the last 72 hours. Anemia Panel: No results for input(s): VITAMINB12, FOLATE, FERRITIN, TIBC, IRON, RETICCTPCT in the last 72 hours. Sepsis Labs: Recent Labs  Lab 03/27/21 0848  PROCALCITON 0.33    No results found for this or any previous visit (from the past 240 hour(s)).        Radiology Studies: No results found.      Scheduled Meds:  cholecalciferol  2,000 Units Oral Daily   [START ON 03/31/2021] cyanocobalamin  1,000 mcg Intramuscular Q30 days   diclofenac Sodium  4 g Topical QID   doxycycline  100 mg Oral Q12H   enoxaparin (LOVENOX) injection  40 mg Subcutaneous Daily   furosemide  20 mg Oral BID   guaiFENesin  600 mg Oral BID   ipratropium-albuterol  3 mL Nebulization TID   multivitamin with minerals  1 tablet Oral Daily   pantoprazole  40 mg Oral Daily   predniSONE  5 mg Oral Q breakfast   Continuous Infusions:  sodium chloride Stopped (03/29/21 0649)     LOS: 12 days    Time spent: 15 mins     Wyvonnia Dusky, MD [facility] Pager [phone]  If 7PM-7AM, please contact night-coverage 03/30/2021, 7:37 AM

## 2021-03-31 ENCOUNTER — Inpatient Hospital Stay: Payer: Medicare Other

## 2021-03-31 LAB — CBC
HCT: 33.9 % — ABNORMAL LOW (ref 36.0–46.0)
Hemoglobin: 11 g/dL — ABNORMAL LOW (ref 12.0–15.0)
MCH: 26.9 pg (ref 26.0–34.0)
MCHC: 32.4 g/dL (ref 30.0–36.0)
MCV: 82.9 fL (ref 80.0–100.0)
Platelets: 495 10*3/uL — ABNORMAL HIGH (ref 150–400)
RBC: 4.09 MIL/uL (ref 3.87–5.11)
RDW: 16 % — ABNORMAL HIGH (ref 11.5–15.5)
WBC: 11.1 10*3/uL — ABNORMAL HIGH (ref 4.0–10.5)
nRBC: 0 % (ref 0.0–0.2)

## 2021-03-31 LAB — BASIC METABOLIC PANEL
Anion gap: 10 (ref 5–15)
BUN: 16 mg/dL (ref 8–23)
CO2: 35 mmol/L — ABNORMAL HIGH (ref 22–32)
Calcium: 8.8 mg/dL — ABNORMAL LOW (ref 8.9–10.3)
Chloride: 89 mmol/L — ABNORMAL LOW (ref 98–111)
Creatinine, Ser: 0.92 mg/dL (ref 0.44–1.00)
GFR, Estimated: >60 mL/min (ref >60)
Glucose, Bld: 89 mg/dL (ref 70–99)
Potassium: 3.6 mmol/L (ref 3.5–5.1)
Sodium: 134 mmol/L — ABNORMAL LOW (ref 135–145)

## 2021-03-31 MED ORDER — CYANOCOBALAMIN 1000 MCG/ML IJ SOLN: 1000.0000 ug | INTRAMUSCULAR | Status: DC

## 2021-03-31 NOTE — Progress Notes (Signed)
PROGRESS NOTE    Ashley Mack  [id number]:[id number] DOB: 29-Jan-1946 DOA: 03/18/2021 PCP: Kirk Ruths, MD    Assessment & Plan:   Principal Problem:   COPD exacerbation [place] [facility]) Active Problems:   COPD with acute exacerbation ([place])   COPD exacerbation: likely secondary to influenza A. Completed tamiflu course. Improved shortness of breath today.  Continue on doxycycline, steroids & encourage incentive spirometry. Repeat CXR shows improved right basilar atelectasis vs infiltrate Pulmon recs apprec  Pulmonary edema: continue on po lasix. (Nurse/IV team unable to get another IV placed).  Monitor I/Os  Acute hypoxic respiratory failure: likely secondary to above. Continue on supplemental oxygen and wean as tolerated. Will likely need to d/c home w/ supplemental oxygen   Leukocytosis: resolved  Thrombocytosis: labile, etiology unclear. Will continue to monitor   Hyponatremia: stable, almost WNL    AKI:  resolved    Hypokalemia: within normal limits today    DVT prophylaxis: lovenox  Code Status: DNR Family Communication:  Disposition Plan: likely d/c home. Pt is unsure if she wants HH or not   Level of care: Med-Surg  Status is: Inpatient  Remains inpatient appropriate because: severity of illness, can likely d/c home in 24 hrs      Consultants:  Pulmon   Procedures:   Antimicrobials:   Subjective: Pt c/o malaise   Objective: Vitals:   03/30/21 1946 03/30/21 2206 03/31/21 0621 03/31/21 0735  BP:  133/68 131/74   Pulse:  (!) 110 (!) 109 (!) 111  Resp:  17 18 18   Temp:  97.7 F (36.5 C) 98.5 F (36.9 C)   TempSrc:      SpO2: 94% 97% 97% 92%  Weight:      Height:        Intake/Output Summary (Last 24 hours) at 03/31/2021 0740 Last data filed at 03/30/2021 1844 Gross per 24 hour  Intake 480 ml  Output 600 ml  Net -120 ml   Filed Weights   03/17/21 1444 03/19/21 2148  Weight: 63.5 kg 63 kg    Examination:  General exam: Appears calm &  comfortable  Respiratory system: decreased breath sounds b/l. No rales Cardiovascular system: S1/S2+. No rubs or clicks  Gastrointestinal system: Abd is soft, NT, ND & normal bowel sounds Central nervous system: alert and oriented. Moves all extremities  Psychiatry: judgement and insight appears normal. Flat mood and affect     Data Reviewed: I have personally reviewed following labs and imaging studies  CBC: Recent Labs  Lab 03/24/21 0913 03/26/21 0845 03/27/21 0848 03/28/21 0533 03/29/21 0501 03/30/21 0447 03/31/21 0425  WBC 19.0*   < > 20.6* 14.6* 10.2 9.4 11.1*  NEUTROABS 16.0*  --   --   --   --   --   --   HGB 12.1   < > 10.9* 10.1* 9.9* 10.2* 11.0*  HCT 37.8   < > 33.8* 30.9* 30.7* 31.5* 33.9*  MCV 85.5   < > 83.3 85.1 83.0 84.5 82.9  PLT 409*   < > 419* 397 436* 467* 495*   < > = values in this interval not displayed.   Basic Metabolic Panel: Recent Labs  Lab 03/27/21 0848 03/28/21 0533 03/29/21 0501 03/30/21 0447 03/31/21 0425  NA 133* 136 135 134* 134*  K 3.3* 3.7 3.2* 3.5 3.6  CL 87* 92* 91* 92* 89*  CO2 35* 34* 35* 33* 35*  GLUCOSE 111* 123* 118* 107* 89  BUN 36* 31* 23 17 16   CREATININE  1.07* 0.92 0.78 0.93 0.92  CALCIUM 8.6* 8.4* 8.8* 8.9 8.8*   GFR: Estimated Creatinine Clearance: 44.7 mL/min (by C-G formula based on SCr of 0.92 mg/dL). Liver Function Tests: Recent Labs  Lab 03/24/21 0913  AST 18  ALT 21  ALKPHOS 64  BILITOT 0.5  PROT 6.0*  ALBUMIN 3.0*   No results for input(s): LIPASE, AMYLASE in the last 168 hours. No results for input(s): AMMONIA in the last 168 hours. Coagulation Profile: No results for input(s): INR, PROTIME in the last 168 hours. Cardiac Enzymes: No results for input(s): CKTOTAL, CKMB, CKMBINDEX, TROPONINI in the last 168 hours. BNP (last 3 results) No results for input(s): PROBNP in the last 8760 hours. HbA1C: No results for input(s): HGBA1C in the last 72 hours. CBG: No results for input(s): GLUCAP in the  last 168 hours. Lipid Profile: No results for input(s): CHOL, HDL, LDLCALC, TRIG, CHOLHDL, LDLDIRECT in the last 72 hours. Thyroid Function Tests: No results for input(s): TSH, T4TOTAL, FREET4, T3FREE, THYROIDAB in the last 72 hours. Anemia Panel: No results for input(s): VITAMINB12, FOLATE, FERRITIN, TIBC, IRON, RETICCTPCT in the last 72 hours. Sepsis Labs: Recent Labs  Lab 03/27/21 0848  PROCALCITON 0.33    No results found for this or any previous visit (from the past 240 hour(s)).        Radiology Studies: No results found.      Scheduled Meds:  cholecalciferol  2,000 Units Oral Daily   cyanocobalamin  1,000 mcg Intramuscular Q30 days   diclofenac Sodium  4 g Topical QID   doxycycline  100 mg Oral Q12H   enoxaparin (LOVENOX) injection  40 mg Subcutaneous Daily   furosemide  20 mg Oral BID   guaiFENesin  600 mg Oral BID   ipratropium-albuterol  3 mL Nebulization TID   multivitamin with minerals  1 tablet Oral Daily   pantoprazole  40 mg Oral Daily   predniSONE  5 mg Oral Q breakfast   Continuous Infusions:  sodium chloride Stopped (03/29/21 0649)     LOS: 13 days    Time spent: 15 mins     Wyvonnia Dusky, MD [facility] Pager [phone]  If 7PM-7AM, please contact night-coverage 03/31/2021, 7:40 AM

## 2021-03-31 NOTE — Progress Notes (Addendum)
SATURATION QUALIFICATIONS: (This note is used to comply with regulatory documentation for home oxygen)  Patient Saturations on Room Air at Rest = 87%  Patient Saturations on Room Air while Ambulating = n/a  Patient Saturations on 2 Liters of oxygen while Ambulating = 91%  Please briefly explain why patient needs home oxygen:

## 2021-03-31 NOTE — TOC Progression Note (Signed)
Transition of Care [facility]) - Progression Note    Patient Details  Name: Ashley Mack MRN: [id number] Date of Birth: 1945/05/04  Transition of Care [facility]) CM/SW [place], LCSW Phone Number: 03/31/2021, 3:41 PM  Clinical Narrative:   Pt is refusing SNF. Advanced spoke to pt and she was agreeable  to [facility] on 1/5.    Expected Discharge Plan: [place] Barriers to Discharge: Continued Medical Work up  Expected Discharge Plan and Services Expected Discharge Plan: [place] In-house Referral: NA   Post Acute Care Choice: [place] arrangements for the past 2 months: Single Family Home                                       Social Determinants of Health (SDOH) Interventions    Readmission Risk Interventions No flowsheet data found.

## 2021-03-31 NOTE — Progress Notes (Signed)
OT Cancellation Note  Patient Details Name: Ashley Mack MRN: [id number] DOB: 11-07-1945   Cancelled Treatment:    Reason Eval/Treat Not Completed: Patient declined, no reason specified. Upon second attempt this morning, pt declining OT tx, citing fatigue this date and requesting OT to re-attempt at later time/date as able.   Fredirick Maudlin, OTR/L [place]

## 2021-03-31 NOTE — Progress Notes (Addendum)
Physical Therapy Treatment Patient Details Name: Ashley Mack MRN: [id number] DOB: [birth year] Today's Date: 03/31/2021   History of Present Illness Pt is a 76 y.o. female with medical history significant for hypertension, anxiety, GERD, migraine, rheumatoid arthritis, fatty liver and COPD, who presented to emergency room with acute onset of worsening dyspnea with associated congested productive cough and wheezing for the last week. Workup +flu.    PT Comments    Pt with marked regression from previous session to date. Pt is able to complete supine > sit modI with min cuing for hand placement for safety with good carry over. Patient is able to complete pivot transfer to [facility] and complete toileting modI with O2 briefly to 87%. Following toileting patient is able to complete STS transfer modI and take 3 steps with RW before needed minA to control rapid need to sit with O2 90% andHR 154bpm with quick return to 117bpm. Following pt is able to ambulate 81ft to chair with difficulty talking or breathing following, but normal vitals. After this patient reports 10/10 RPE cannot attempt any further amb or planned stair attempt, and reports chest pain. PT notified RN of chest pain. PT recommends SNF placement d/t inability to demonstrate stair negotiation needed to enter/exit her home, and unpredictable unsteadiness with gait. Patient reports having a lot of family help at home. If medically stable, and family is able to supervise patient 24/7 for d/c and provide ramp or alternative to enter/exit home, this recommendation can be changed to 24/7 supervision.     Recommendations for follow up therapy are one component of a multi-disciplinary discharge planning process, led by the attending physician.  Recommendations may be updated based on patient status, additional functional criteria and insurance authorization.  Follow Up Recommendations  Skilled nursing-short term rehab (<3 hours/day)     Assistance  Recommended at Discharge Frequent or constant Supervision/Assistance  Patient can return home with the following Assist for transportation;Help with stairs or ramp for entrance;A lot of help with walking and/or transfers;A lot of help with bathing/dressing/bathroom   Equipment Recommendations  Rolling walker (2 wheels);BSC/3in1    Recommendations for Other Services       Precautions / Restrictions       Mobility  Bed Mobility Overal bed mobility: Needs Assistance Bed Mobility: Supine to Sit;Sit to Supine     Supine to sit: HOB elevated;Supervision          Transfers Overall transfer level: Needs assistance Equipment used: Rolling walker (2 wheels) Transfers: Sit to/from Stand Sit to Stand: Supervision Stand pivot transfers: Min guard         General transfer comment: requires cuing for sequencing/hand placement with STS transfer, and gaurding for pivot to [facility]    Ambulation/Gait Ambulation/Gait assistance: Min guard Gait Distance (Feet): 20 Feet Assistive device: Rolling walker (2 wheels)         General Gait Details: decreased velocity, decreased step length/height   Stairs             Wheelchair Mobility    Modified Rankin (Stroke Patients Only)       Balance Overall balance assessment: Needs assistance Sitting-balance support: Feet supported Sitting balance-Leahy Scale: Good Sitting balance - Comments: good sitting balance reaching within BOS at EOB   Standing balance support: Bilateral upper extremity supported;During functional activity Standing balance-Leahy Scale: Fair Standing balance comment: required some kind of UE support for any dynamic tasks/ambulations  Cognition Arousal/Alertness: Awake/alert Behavior During Therapy: WFL for tasks assessed/performed Overall Cognitive Status: Within Functional Limits for tasks assessed                                           Exercises      General Comments        Pertinent Vitals/Pain Pain Assessment: Faces Faces Pain Scale: Hurts little more Pain Location: back    Home Living                          Prior Function            PT Goals (current goals can now be found in the care plan section) Acute Rehab PT Goals Patient Stated Goal: to go home PT Goal Formulation: With patient Time For Goal Achievement: 04/02/21 Potential to Achieve Goals: Good Progress towards PT goals: Not progressing toward goals - comment    Frequency    Min 2X/week      PT Plan Discharge plan needs to be updated    Co-evaluation              AM-PAC PT "6 Clicks" Mobility   Outcome Measure  Help needed turning from your back to your side while in a flat bed without using bedrails?: A Little Help needed moving from lying on your back to sitting on the side of a flat bed without using bedrails?: A Lot Help needed moving to and from a bed to a chair (including a wheelchair)?: A Little Help needed standing up from a chair using your arms (e.g., wheelchair or bedside chair)?: A Little Help needed to walk in hospital room?: A Lot Help needed climbing 3-5 steps with a railing? : Total 6 Click Score: 14    End of Session Equipment Utilized During Treatment: Oxygen Activity Tolerance: Other (comment);Patient limited by fatigue Patient left: with call bell/phone within reach;in chair;with chair alarm set Nurse Communication: Mobility status PT Visit Diagnosis: Other abnormalities of gait and mobility (R26.89);Difficulty in walking, not elsewhere classified (R26.2);Muscle weakness (generalized) (M62.81)     Time: 6945-0388 PT Time Calculation (min) (ACUTE ONLY): 26 min  Charges:  $Therapeutic Activity: 23-37 mins                     Durwin Reges DPT  Durwin Reges 03/31/2021, 11:33 AM

## 2021-03-31 NOTE — Progress Notes (Signed)
Found pt on RA with sat 85. Pt place back on 2Lnc with sat in low 90's

## 2021-04-01 LAB — CBC
HCT: 35 % — ABNORMAL LOW (ref 36.0–46.0)
Hemoglobin: 11.3 g/dL — ABNORMAL LOW (ref 12.0–15.0)
MCH: 26.9 pg (ref 26.0–34.0)
MCHC: 32.3 g/dL (ref 30.0–36.0)
MCV: 83.3 fL (ref 80.0–100.0)
Platelets: 500 10*3/uL — ABNORMAL HIGH (ref 150–400)
RBC: 4.2 MIL/uL (ref 3.87–5.11)
RDW: 16 % — ABNORMAL HIGH (ref 11.5–15.5)
WBC: 9.9 10*3/uL (ref 4.0–10.5)
nRBC: 0 % (ref 0.0–0.2)

## 2021-04-01 LAB — BASIC METABOLIC PANEL
Anion gap: 10 (ref 5–15)
BUN: 20 mg/dL (ref 8–23)
CO2: 34 mmol/L — ABNORMAL HIGH (ref 22–32)
Calcium: 8.8 mg/dL — ABNORMAL LOW (ref 8.9–10.3)
Chloride: 88 mmol/L — ABNORMAL LOW (ref 98–111)
Creatinine, Ser: 1.2 mg/dL — ABNORMAL HIGH (ref 0.44–1.00)
GFR, Estimated: 47 mL/min — ABNORMAL LOW (ref >60)
Glucose, Bld: 154 mg/dL — ABNORMAL HIGH (ref 70–99)
Potassium: 3.4 mmol/L — ABNORMAL LOW (ref 3.5–5.1)
Sodium: 132 mmol/L — ABNORMAL LOW (ref 135–145)

## 2021-04-01 MED ORDER — DOXYCYCLINE HYCLATE 100 MG PO TABS: 100.0000 mg | ORAL_TABLET | Freq: Two times a day (BID) | ORAL | 0 refills | Status: AC

## 2021-04-01 MED ORDER — LEVOFLOXACIN 500 MG PO TABS: 500.0000 mg | ORAL_TABLET | Freq: Every day | ORAL | 0 refills | Status: DC

## 2021-04-01 NOTE — Progress Notes (Signed)
Patient is discharging home with transportation provided by family.  Patient did not have PIV access prior to discharge with AVS education provided by primary RN and all questions and concerns regarding changes to medications and follow up appointments given to patient and family member at beside.  Patient was discharged off the unit via wheelchair by nursing staff @ approx 1730.

## 2021-04-01 NOTE — Progress Notes (Signed)
PULMONOLOGY         Date: 04/01/2021,   MRN# [id number] Ashley Mack [birth year]     AdmissionWeight: 63.5 kg                 CurrentWeight: 46 kg   Referring physician: Dr Sloan Leiter   CHIEF COMPLAINT:   Increased O2 requirement   HISTORY OF PRESENT ILLNESS   This is a 76 yo F with hx of anemia, anxiety, OA, RA, retinopathy, NASH, CKD, chronic migraines, osteoporosis, emphysema who came in with worsening SOB/DOE.  Seh was seen at urgent care and post treatment continued to deteriorate.  Influenza A came back positive and influenza antigen was negative.  COVID-19 PCR came back negative.  D-dimer was significantly elevated at 6.44.After 5d treatment patient has not reached improvement substantially and requires incresead O2 with PCCM consultation for further evaluation and management.   04/01/21- patient is improved slowly and is being optmized for DC.  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)    Iron (Fe) deficiency anemia    Migraine headache    in past   Migraines    Motion sickness    No natural teeth    Osteoporosis    Rheumatoid arthritis ([place])    Vertigo      SURGICAL HISTORY   Past Surgical History:  Procedure Laterality Date   CATARACT EXTRACTION W/PHACO Right 08/12/2016   Procedure: CATARACT EXTRACTION PHACO AND INTRAOCULAR LENS PLACEMENT ([place])  Right;  Surgeon: Leandrew Koyanagi, MD;  Location: [place];  Service: Ophthalmology;  Laterality: Right;   CATARACT EXTRACTION W/PHACO Left 09/30/2016   Procedure:  CATARACT EXTRACTION PHACO AND INTRAOCULAR LENS PLACEMENT ([place]) Left;  Surgeon: Leandrew Koyanagi, MD;  Location: [place];  Service: Ophthalmology;  Laterality: Left;   COLONOSCOPY     COLONOSCOPY WITH PROPOFOL N/A 12/09/2016   Procedure: COLONOSCOPY WITH PROPOFOL;  Surgeon: Toledo, Benay Pike, MD;  Location: [facility];  Service: Endoscopy;  Laterality: N/A;   ESOPHAGOGASTRODUODENOSCOPY     ESOPHAGOGASTRODUODENOSCOPY (EGD) WITH PROPOFOL N/A 12/09/2016   Procedure: ESOPHAGOGASTRODUODENOSCOPY (EGD) WITH PROPOFOL;  Surgeon: Toledo, Benay Pike, MD;  Location: [facility];  Service: Endoscopy;  Laterality: N/A;   KYPHOPLASTY N/A 08/10/2017   Procedure: Hewitt Shorts;  Surgeon: Hessie Knows, MD;  Location: [facility];  Service: Orthopedics;  Laterality: N/A;   KYPHOPLASTY N/A 09/09/2017   Procedure: [id number],W5;  Surgeon: Hessie Knows, MD;  Location: [facility];  Service: Orthopedics;  Laterality: N/A;   KYPHOPLASTY N/A 09/28/2017   Procedure: [id number];  Surgeon: Hessie Knows, MD;  Location: [facility];  Service: Orthopedics;  Laterality: N/A;   KYPHOPLASTY N/A 06/19/2019   Procedure: KYPHOPLASTY T5;  Surgeon: Hessie Knows, MD;  Location: [facility];  Service: Orthopedics;  Laterality: N/A;   OOPHORECTOMY Bilateral 2003   RIGHT/LEFT HEART CATH AND CORONARY ANGIOGRAPHY Bilateral 10/19/2019   Procedure: RIGHT/LEFT HEART CATH AND CORONARY ANGIOGRAPHY;  Surgeon: Yolonda Kida, MD;  Location: [facility];  Service: Cardiovascular;  Laterality: Bilateral;     FAMILY HISTORY   Family History  Problem Relation Age of Onset   Kidney disease Mother    Diabetes Mellitus II Brother      SOCIAL HISTORY   Social History   Tobacco Use   Smoking status: Former    Packs/day: 1.00    Types: Cigarettes    Quit date: 2009    Years since quitting: 14.0   Smokeless tobacco: Never  Vaping Use   Vaping Use: Never used  Substance Use Topics   Alcohol use: No   Drug  use: No     MEDICATIONS    Home Medication:    Current Medication:  Current Facility-Administered Medications:    0.9 %  sodium chloride infusion, , Intravenous, PRN, Wyvonnia Dusky, MD, Stopped at 03/29/21 [phone]   acetaminophen (TYLENOL) tablet 650 mg, 650 mg, Oral, Q6H PRN, 650 mg at 03/26/21 1808 **OR** acetaminophen (TYLENOL) suppository 650 mg, 650 mg, Rectal, Q6H PRN, Mansy, Jan A, MD   ALPRAZolam Duanne Moron) tablet 0.25 mg, 0.25 mg, Oral, TID PRN, Sharion Settler, NP, 0.25 mg at 03/22/21 2224   cholecalciferol (VITAMIN D3) tablet 2,000 Units, 2,000 Units, Oral, Daily, Mansy, Jan A, MD, 2,000 Units at 03/31/21 1031   [START ON 04/14/2021] cyanocobalamin ((VITAMIN B-12)) injection 1,000 mcg, 1,000 mcg, Intramuscular, Q30 days, Williams, Jamiese M, MD   cyclobenzaprine (FLEXERIL) tablet 5 mg, 5 mg, Oral, TID PRN, Mansy, Jan A, MD, 5 mg at 03/31/21 2050   diclofenac Sodium (VOLTAREN) 1 % topical gel 4 g, 4 g, Topical, QID, Ghimire, Kuber, MD, 4 g at 03/30/21 0814   doxycycline (VIBRA-TABS) tablet 100 mg, 100 mg, Oral, Q12H, Jimmye Norman, Jamiese M, MD, 100 mg at 03/31/21 2050   enoxaparin (LOVENOX) injection 40 mg, 40 mg, Subcutaneous, Daily, Mansy, Jan A, MD, 40 mg at 03/31/21 1032   fluticasone (FLONASE) 50 MCG/ACT nasal spray 2 spray, 2 spray, Each Nare, Daily PRN, Mansy, Jan A, MD, 2 spray at 03/31/21 0802   furosemide (LASIX) tablet 20 mg, 20 mg, Oral, BID, Jimmye Norman, Jamiese M, MD, 20 mg at 03/31/21 1759   guaiFENesin (MUCINEX) 12 hr tablet 600 mg, 600 mg, Oral, BID, Mansy, Jan A, MD, 600 mg at 03/31/21 2050   Ipratropium-Albuterol (COMBIVENT) respimat 2 puff, 2 puff, Inhalation, Q4H PRN, Mansy, Jan A, MD   ipratropium-albuterol (DUONEB) 0.5-2.5 (3) MG/3ML nebulizer solution 3 mL, 3 mL, Nebulization, TID, Ghimire, Kuber, MD, 3 mL at 04/01/21 0808   magnesium hydroxide (MILK OF MAGNESIA) suspension 30 mL, 30 mL, Oral, Daily PRN, Mansy, Jan A, MD   meclizine (ANTIVERT) tablet 25 mg, 25  mg, Oral, TID PRN, Wyvonnia Dusky, MD, 25 mg at 03/27/21 1530   multivitamin with minerals tablet 1 tablet, 1 tablet, Oral, Daily, Mansy, Jan A, MD, 1 tablet at 03/31/21 1031   ondansetron (ZOFRAN) tablet 4 mg, 4 mg, Oral, Q6H PRN, 4 mg at 03/31/21 0752 **OR** ondansetron (ZOFRAN) injection 4 mg, 4 mg, Intravenous, Q6H PRN, Mansy, Jan A, MD   pantoprazole (PROTONIX) EC tablet 40 mg, 40 mg, Oral, Daily, Mansy, Jan A, MD, 40 mg at 03/31/21 0803   predniSONE (DELTASONE) tablet 5 mg, 5 mg, Oral, Q breakfast, Dallie Piles, RPH, 5 mg at 03/31/21 1032   sodium chloride (OCEAN) 0.65 % nasal spray 1 spray, 1 spray, Nasal, PRN, Mansy, Jan A, MD   traZODone (DESYREL) tablet 25 mg, 25 mg, Oral, QHS PRN,  Mansy, Jan A, MD, 25 mg at 03/22/21 2224    ALLERGIES   Methotrexate derivatives, Other, Ranitidine, Sulfasalazine, and Topiramate     REVIEW OF SYSTEMS    Review of Systems:  Gen:  Denies  fever, sweats, chills weigh loss  HEENT: Denies blurred vision, double vision, ear pain, eye pain, hearing loss, nose bleeds, sore throat Cardiac:  No dizziness, chest pain or heaviness, chest tightness,edema Resp:   Denies cough or sputum porduction, shortness of breath,wheezing, hemoptysis,  Gi: Denies swallowing difficulty, stomach pain, nausea or vomiting, diarrhea, constipation, bowel incontinence Gu:  Denies bladder incontinence, burning urine Ext:   Denies Joint pain, stiffness or swelling Skin: Denies  skin rash, easy bruising or bleeding or hives Endoc:  Denies polyuria, polydipsia , polyphagia or weight change Psych:   Denies depression, insomnia or hallucinations   Other:  All other systems negative   VS: BP 138/74 (BP Location: Left Arm)    Pulse (!) 106    Temp 98 F (36.7 C)    Resp 18    Ht 5\' 2"  (1.575 m)    Wt 63 kg    SpO2 93%    BMI 25.42 kg/m      PHYSICAL EXAM    GENERAL:NAD, no fevers, chills, no weakness no fatigue HEAD: Normocephalic, atraumatic.  EYES: Pupils  equal, round, reactive to light. Extraocular muscles intact. No scleral icterus.  MOUTH: Moist mucosal membrane. Dentition intact. No abscess noted.  EAR, NOSE, THROAT: Clear without exudates. No external lesions.  NECK: Supple. No thyromegaly. No nodules. No JVD.  PULMONARY: Diffuse coarse rhonchi right sided +wheezes CARDIOVASCULAR: S1 and S2. Regular rate and rhythm. No murmurs, rubs, or gallops. No edema. Pedal pulses 2+ bilaterally.  GASTROINTESTINAL: Soft, nontender, nondistended. No masses. Positive bowel sounds. No hepatosplenomegaly.  MUSCULOSKELETAL: No swelling, clubbing, or edema. Range of motion full in all extremities.  NEUROLOGIC: Cranial nerves II through XII are intact. No gross focal neurological deficits. Sensation intact. Reflexes intact.  SKIN: No ulceration, lesions, rashes, or cyanosis. Skin warm and dry. Turgor intact.  PSYCHIATRIC: Mood, affect within normal limits. The patient is awake, alert and oriented x 3. Insight, judgment intact.       IMAGING    DG Chest 2 View  Result Date: 03/17/2021 CLINICAL DATA:  Shortness of breath. EXAM: CHEST - 2 VIEW COMPARISON:  March 11, 2021. FINDINGS: The heart size and mediastinal contours are within normal limits. Mild bibasilar subsegmental atelectasis is noted. Small pleural effusions may be present. Status post kyphoplasty at multiple levels of the thoracic spine. Multiple probable old compression fractures are noted as well. IMPRESSION: Mild bibasilar subsegmental atelectasis with small bilateral pleural effusions. Electronically Signed   By: Marijo Conception M.D.   On: 03/17/2021 15:36   DG Chest 2 View  Result Date: 03/11/2021 CLINICAL DATA:  Shortness of breath, cough EXAM: CHEST - 2 VIEW COMPARISON:  09/15/2020 FINDINGS: Cardiac size is within normal limits. Thoracic aorta is tortuous and ectatic. Increase in AP diameter of chest suggests COPD. There are no signs of alveolar pulmonary edema or focal pulmonary  consolidation. There is improvement in aeration of lower lung fields. There is no pleural effusion or pneumothorax. Osteopenia is seen in bony structures. There is decrease in height of multiple thoracic vertebral bodies. There is previous vertebroplasty in multiple thoracic vertebral bodies. IMPRESSION: COPD. There are no signs of pulmonary edema or focal pulmonary consolidation. Electronically Signed   By: Elmer Picker M.D.   On: 03/11/2021  09:43   CT Angio Chest PE W and/or Wo Contrast  Result Date: 03/18/2021 CLINICAL DATA:  Low oxygen saturation and elevated D-dimer. EXAM: CT ANGIOGRAPHY CHEST WITH CONTRAST TECHNIQUE: Multidetector CT imaging of the chest was performed using the standard protocol during bolus administration of intravenous contrast. Multiplanar CT image reconstructions and MIPs were obtained to evaluate the vascular anatomy. CONTRAST:  94mL OMNIPAQUE IOHEXOL 350 MG/ML SOLN COMPARISON:  June 16, 2019 FINDINGS: Cardiovascular: There is marked severity calcification of the thoracic aorta, without evidence of aortic aneurysm or dissection. Satisfactory opacification of the pulmonary arteries to the segmental level. No evidence of pulmonary embolism. Normal heart size. No pericardial effusion. Mediastinum/Nodes: No enlarged mediastinal, hilar, or axillary lymph nodes. Thyroid gland, trachea, and esophagus demonstrate no significant findings. Lungs/Pleura: There is mild central lobular emphysema. Mild atelectatic changes are seen within the posterior aspects of the bilateral lung bases, right greater than left. There is no evidence of a pleural effusion or pneumothorax. Upper Abdomen: A 1.8 cm x 1.1 cm focus of parenchymal low attenuation is seen within the posterior aspect of the left lobe of the liver. Musculoskeletal: Compression fracture deformities of indeterminate age are seen involving the T6, T7, T8 and T9 vertebral bodies. This is most severe at the level of T7, which represents a  new finding when compared to the prior study. Evidence of prior vertebroplasty is noted at the levels of T5, T11, T12, L1 and L2. Review of the MIP images confirms the above findings. IMPRESSION: 1. No evidence of pulmonary embolus. 2. Mild central lobular emphysema. 3. Compression fracture deformities of indeterminate age involving the T6, T7, T8 and T9 vertebral bodies, most severe at the level of T7. 4. Evidence of prior vertebroplasty at the levels of T5, T11, T12, L1 and L2. Aortic Atherosclerosis (ICD10-I70.0) and Emphysema (ICD10-J43.9). Electronically Signed   By: Virgina Norfolk M.D.   On: 03/18/2021 04:00   US Venous Img Lower Bilateral (DVT)  Result Date: 03/19/2021 CLINICAL DATA:  Shortness of breath, leg pain EXAM: BILATERAL LOWER EXTREMITY VENOUS DOPPLER ULTRASOUND TECHNIQUE: Gray-scale sonography with compression, as well as color and duplex ultrasound, were performed to evaluate the deep venous system(s) from the level of the common femoral vein through the popliteal and proximal calf veins. COMPARISON:  None. FINDINGS: VENOUS Normal compressibility of BILATERAL common femoral, superficial femoral, and popliteal veins, as well as the visualized calf veins. Visualized portions of BILATERAL profunda femoral vein and great saphenous vein unremarkable. No filling defects to suggest DVT on grayscale or color Doppler imaging. Doppler waveforms show normal direction of venous flow, normal respiratory plasticity and response to augmentation. OTHER None. Limitations: none IMPRESSION: No evidence of deep venous thrombosis in either lower extremity. Electronically Signed   By: Lavonia Dana M.D.   On: 03/19/2021 09:59   DG Chest Port 1 View  Result Date: 03/31/2021 CLINICAL DATA:  Dyspnea. EXAM: PORTABLE CHEST 1 VIEW COMPARISON:  Chest x-ray dated March 27, 2021. FINDINGS: The heart size and mediastinal contours are within normal limits. Normal pulmonary vascularity. Mildly improved opacity at the  right lung base. Unchanged left basilar atelectasis/scarring. Similar chronic blunting of the right costophrenic angle. No pleural effusion or pneumothorax. No acute osseous abnormality. IMPRESSION: 1. Mildly improved right basilar atelectasis versus infiltrate. 2. Unchanged left basilar atelectasis/scarring. Electronically Signed   By: Titus Dubin M.D.   On: 03/31/2021 08:26   DG Chest Port 1 View  Result Date: 03/27/2021 CLINICAL DATA:  3 atelectasis EXAM: PORTABLE CHEST 1  VIEW COMPARISON:  03/24/2021 FINDINGS: Worsening opacity at the right base. Opacities hazy and streaky. Streaky density at the left base is well. Small right pleural effusion is likely. No pneumothorax. No cardiomegaly when accounting for mediastinal fat. Aortic tortuosity. Multilevel cement augmentation of the spine. IMPRESSION: Increased infiltrate or atelectasis at the lung bases. Electronically Signed   By: Jorje Guild M.D.   On: 03/27/2021 07:47   DG Chest Port 1 View  Result Date: 03/24/2021 CLINICAL DATA:  Difficulty breathing EXAM: PORTABLE CHEST 1 VIEW COMPARISON:  Previous studies including the examination of 03/21/2021 FINDINGS: Transverse diameter of heart is slightly increased. Thoracic aorta is tortuous and ectatic. There are small linear densities in the lower lung fields, more so on the left side with no significant interval change. There is no focal pulmonary consolidation. There is no significant pleural effusion or pneumothorax. IMPRESSION: Linear densities in the lower lung fields have not changed significantly. There are no new focal infiltrates or signs of pulmonary edema. Electronically Signed   By: Elmer Picker M.D.   On: 03/24/2021 09:50   DG Chest Port 1 View  Result Date: 03/21/2021 CLINICAL DATA:  Shortness of breath EXAM: PORTABLE CHEST 1 VIEW COMPARISON:  Previous studies including the examination of 03/17/2021 FINDINGS: Cardiac size is unremarkable. Thoracic aorta is tortuous and ectatic.  There are no signs of pulmonary edema. There is crowding of markings in the lower lung fields with interval improvement. No new focal infiltrates are seen. There is no pleural effusion or pneumothorax. There is evidence of vertebroplasty in the multiple thoracic vertebral bodies IMPRESSION: There is interval improvement in aeration of lower lung fields suggesting resolving subsegmental atelectasis. There are no new infiltrates or signs of pulmonary edema. Electronically Signed   By: Elmer Picker M.D.   On: 03/21/2021 12:06   ECHOCARDIOGRAM COMPLETE  Result Date: 03/20/2021    ECHOCARDIOGRAM REPORT   Patient Name:   Ashley Mack Date of Exam: 03/20/2021 Medical Rec #:  [id number]     Height:       62.0 in Accession #:    [id number]    Weight:       139.0 lb Date of Birth:  Oct 23, 1945     BSA:          1.638 m Patient Age:    14 years      BP:           128/78 mmHg Patient Gender: F             HR:           112 bpm. Exam Location:  [facility] Procedure: 2D Echo, Color Doppler and Cardiac Doppler Indications:     R06.00 Dyspnea  History:         Patient has no prior history of Echocardiogram examinations.                  COPD, Signs/Symptoms:Shortness of Breath; Risk                  Factors:Hypertension. Positive for influenza type A.  Sonographer:     Charmayne Sheer Referring Phys:  [id number] Barb Merino Diagnosing Phys: Isaias Cowman MD  Sonographer Comments: Suboptimal parasternal window and suboptimal subcostal window. Image acquisition challenging due to COPD. IMPRESSIONS  1. Left ventricular ejection fraction, by estimation, is 55 to 60%. The left ventricle has normal function. The left ventricle has no regional wall motion abnormalities. Left ventricular diastolic parameters are consistent with Grade  I diastolic dysfunction (impaired relaxation).  2. Right ventricular systolic function is normal. The right ventricular size is normal.  3. The mitral valve is normal in structure. Mild mitral valve  regurgitation. No evidence of mitral stenosis.  4. The aortic valve is normal in structure. Aortic valve regurgitation is not visualized. No aortic stenosis is present.  5. The inferior vena cava is normal in size with greater than 50% respiratory variability, suggesting right atrial pressure of 3 mmHg. FINDINGS  Left Ventricle: Left ventricular ejection fraction, by estimation, is 55 to 60%. The left ventricle has normal function. The left ventricle has no regional wall motion abnormalities. The left ventricular internal cavity size was normal in size. There is  no left ventricular hypertrophy. Left ventricular diastolic parameters are consistent with Grade I diastolic dysfunction (impaired relaxation). Right Ventricle: The right ventricular size is normal. No increase in right ventricular wall thickness. Right ventricular systolic function is normal. Left Atrium: Left atrial size was normal in size. Right Atrium: Right atrial size was normal in size. Pericardium: There is no evidence of pericardial effusion. Mitral Valve: The mitral valve is normal in structure. Mild mitral valve regurgitation. No evidence of mitral valve stenosis. MV peak gradient, 5.3 mmHg. The mean mitral valve gradient is 3.0 mmHg. Tricuspid Valve: The tricuspid valve is normal in structure. Tricuspid valve regurgitation is mild . No evidence of tricuspid stenosis. Aortic Valve: The aortic valve is normal in structure. Aortic valve regurgitation is not visualized. No aortic stenosis is present. Aortic valve mean gradient measures 4.0 mmHg. Aortic valve peak gradient measures 7.2 mmHg. Aortic valve area, by VTI measures 3.56 cm. Pulmonic Valve: The pulmonic valve was normal in structure. Pulmonic valve regurgitation is not visualized. No evidence of pulmonic stenosis. Aorta: The aortic root is normal in size and structure. Venous: The inferior vena cava is normal in size with greater than 50% respiratory variability, suggesting right atrial  pressure of 3 mmHg. IAS/Shunts: No atrial level shunt detected by color flow Doppler.  LEFT VENTRICLE PLAX 2D LVOT diam:     2.20 cm     Diastology LV SV:         84          LV e' medial:    6.42 cm/s LV SV Index:   52          LV E/e' medial:  16.5 LVOT Area:     3.80 cm    LV e' lateral:   7.07 cm/s                            LV E/e' lateral: 15.0  LV Volumes (MOD) LV vol d, MOD A2C: 48.6 ml LV vol d, MOD A4C: 62.2 ml LV vol s, MOD A2C: 24.0 ml LV vol s, MOD A4C: 32.5 ml LV SV MOD A2C:     24.6 ml LV SV MOD A4C:     62.2 ml LV SV MOD BP:      28.9 ml RIGHT VENTRICLE RV Basal diam:  3.16 cm LEFT ATRIUM             Index        RIGHT ATRIUM           Index LA Vol (A2C):   26.6 ml 16.24 ml/m  RA Area:     10.30 cm LA Vol (A4C):   23.0 ml 14.04 ml/m  RA Volume:   21.80 ml  13.31 ml/m LA Biplane Vol: 26.3 ml 16.06 ml/m  AORTIC VALVE                    PULMONIC VALVE AV Area (Vmax):    3.49 cm     PV Vmax:       1.20 m/s AV Area (Vmean):   3.31 cm     PV Vmean:      83.500 cm/s AV Area (VTI):     3.56 cm     PV VTI:        0.205 m AV Vmax:           134.00 cm/s  PV Peak grad:  5.8 mmHg AV Vmean:          98.100 cm/s  PV Mean grad:  3.0 mmHg AV VTI:            0.237 m AV Peak Grad:      7.2 mmHg AV Mean Grad:      4.0 mmHg LVOT Vmax:         123.00 cm/s LVOT Vmean:        85.500 cm/s LVOT VTI:          0.222 m LVOT/AV VTI ratio: 0.94  AORTA Ao Root diam: 2.80 cm MITRAL VALVE MV Area (PHT): 7.09 cm     SHUNTS MV Area VTI:   5.62 cm     Systemic VTI:  0.22 m MV Peak grad:  5.3 mmHg     Systemic Diam: 2.20 cm MV Mean grad:  3.0 mmHg MV Vmax:       1.15 m/s MV Vmean:      77.5 cm/s MV Decel Time: 107 msec MV E velocity: 106.00 cm/s MV A velocity: 113.00 cm/s MV E/A ratio:  0.94 Isaias Cowman MD Electronically signed by Isaias Cowman MD Signature Date/Time: 03/20/2021/1:43:48 PM    Final           ASSESSMENT/PLAN   Acute hypoxemic respiratory failure   Due to concomitant influenza A infection  with COPD exacerbation and atelectasis with mild effusion of right pleural space.      - patient would benefit from pulmonary optimization  -c/w zithromax/rocepin>>>Doxycycline -he may be developing bacterial pneumonia, will obtain procalcitonin and start doxycycline -Arterial blood gas after weaning to 2L/min today please    Influenza A infetion   - Tamiflu   - stopped Tessalon to allow expectoration    - Dcd Tussinex hinders respiratory effort   - dcd pulmicort - patient on IV steroids                03/24/21-dcd steroids   Mild pulmonary edema  - lasix 20 IV bid   Acute on Chronic COPD    - continue Duoneb    - patient has allergy to Trelegy and Anoro    - continue steriods - reduced solumedrol to 40 once daily IV>>20>>dcd     - IS and flutter are at bedside   Bibasilar atelectasis    Patient would benefit from aggressive recruitment maneuvers -PT/OT should be daily    Thank you for allowing me to participate in the care of this patient.  Total face to face encounter time for this patient visit was >45 min. >50% of the time was  spent in counseling and coordination of care.   Patient/Family are satisfied with care plan and all questions have been answered.  This document was prepared using Systems analyst and may  include unintentional dictation errors.     Ottie Glazier, M.D.  Division of [facility]

## 2021-04-01 NOTE — Progress Notes (Signed)
PULMONOLOGY         Date: 04/01/2021,   MRN# [id number] Ashley Mack 1945/09/20     AdmissionWeight: 63.5 kg                 CurrentWeight: 34 kg   Referring physician: Dr Sloan Leiter   CHIEF COMPLAINT:   Increased O2 requirement   HISTORY OF PRESENT ILLNESS   This is a 76 yo F with hx of anemia, anxiety, OA, RA, retinopathy, NASH, CKD, chronic migraines, osteoporosis, emphysema who came in with worsening SOB/DOE.  Seh was seen at urgent care and post treatment continued to deteriorate.  Influenza A came back positive and influenza antigen was negative.  COVID-19 PCR came back negative.  D-dimer was significantly elevated at 6.44.After 5d treatment patient has not reached improvement substantially and requires incresead O2 with PCCM consultation for further evaluation and management.    03/23/21- patient with increased O2 req, I have reduced steroids and increased diuretic. Continue current regimen.  04/01/21- patient continues to slowly improve. She did PT yesterday and felt a bit stronger. Her chest x ray is improved.  She is on 2-3L/min [place].  I have stopped metaneb. She had COVID19 2 years ago and now with influenza on top of her rheumatoid inflammatory disease its taking her some extra time. We can stop lasix now. 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)    Iron (Fe) deficiency anemia    Migraine headache    in past   Migraines    Motion sickness    No natural teeth    Osteoporosis    Rheumatoid arthritis ([place])    Vertigo      SURGICAL HISTORY    Past Surgical History:  Procedure Laterality Date   CATARACT EXTRACTION W/PHACO Right 08/12/2016   Procedure: CATARACT EXTRACTION PHACO AND INTRAOCULAR LENS PLACEMENT ([place])  Right;  Surgeon: Leandrew Koyanagi, MD;  Location: [facility];  Service: Ophthalmology;  Laterality: Right;   CATARACT EXTRACTION W/PHACO Left 09/30/2016   Procedure: CATARACT EXTRACTION PHACO AND INTRAOCULAR LENS PLACEMENT ([place]) Left;  Surgeon: Leandrew Koyanagi, MD;  Location: [facility];  Service: Ophthalmology;  Laterality: Left;   COLONOSCOPY     COLONOSCOPY WITH PROPOFOL N/A 12/09/2016   Procedure: COLONOSCOPY WITH PROPOFOL;  Surgeon: Toledo, Benay Pike, MD;  Location: [facility];  Service: Endoscopy;  Laterality: N/A;   ESOPHAGOGASTRODUODENOSCOPY     ESOPHAGOGASTRODUODENOSCOPY (EGD) WITH PROPOFOL N/A 12/09/2016   Procedure: ESOPHAGOGASTRODUODENOSCOPY (EGD) WITH PROPOFOL;  Surgeon: Toledo, Benay Pike, MD;  Location: [facility];  Service: Endoscopy;  Laterality: N/A;   KYPHOPLASTY N/A 08/10/2017   Procedure: Hewitt Shorts;  Surgeon: Hessie Knows, MD;  Location: [facility];  Service: Orthopedics;  Laterality: N/A;   KYPHOPLASTY N/A 09/09/2017   Procedure: OINOMVEHMCN-O7,S9;  Surgeon: Hessie Knows, MD;  Location: [facility];  Service: Orthopedics;  Laterality: N/A;   KYPHOPLASTY N/A 09/28/2017   Procedure: [id number];  Surgeon: Hessie Knows, MD;  Location: [facility];  Service: Orthopedics;  Laterality: N/A;   KYPHOPLASTY N/A 06/19/2019   Procedure: KYPHOPLASTY T5;  Surgeon: Hessie Knows, MD;  Location: [facility];  Service: Orthopedics;  Laterality: N/A;   OOPHORECTOMY Bilateral 2003   RIGHT/LEFT HEART CATH AND CORONARY ANGIOGRAPHY Bilateral 10/19/2019   Procedure: RIGHT/LEFT HEART CATH AND CORONARY ANGIOGRAPHY;  Surgeon: Yolonda Kida, MD;  Location: [facility];  Service: Cardiovascular;  Laterality: Bilateral;     FAMILY HISTORY   Family History  Problem Relation Age of  Onset   Kidney disease Mother    Diabetes Mellitus II Brother      SOCIAL HISTORY   Social History   Tobacco Use   Smoking status: Former    Packs/day: 1.00    Types: Cigarettes    Quit date: 2009    Years since quitting: 14.0   Smokeless tobacco: Never  Vaping Use   Vaping Use: Never used  Substance Use Topics   Alcohol use: No   Drug use: No     MEDICATIONS    Home Medication:    Current Medication:  Current Facility-Administered Medications:    0.9 %  sodium chloride infusion, , Intravenous, PRN, Wyvonnia Dusky, MD, Stopped at 03/29/21 [id number]   acetaminophen (TYLENOL) tablet 650 mg, 650 mg, Oral, Q6H PRN, 650 mg at 03/26/21 1808 **OR** acetaminophen (TYLENOL) suppository 650 mg, 650 mg, Rectal, Q6H PRN, Mansy, Jan A, MD   ALPRAZolam Duanne Moron) tablet 0.25 mg, 0.25 mg, Oral, TID PRN, Sharion Settler, NP, 0.25 mg at 03/22/21 2224   cholecalciferol (VITAMIN D3) tablet 2,000 Units, 2,000 Units, Oral, Daily, Mansy, Jan A, MD, 2,000 Units at 03/31/21 1031   [START ON 04/14/2021] cyanocobalamin ((VITAMIN B-12)) injection 1,000 mcg, 1,000 mcg, Intramuscular, Q30 days, Williams, Jamiese M, MD   cyclobenzaprine (FLEXERIL) tablet 5 mg, 5 mg, Oral, TID PRN, Mansy, Jan A, MD, 5 mg at 03/31/21 2050   diclofenac Sodium (VOLTAREN) 1 % topical gel 4 g, 4 g, Topical, QID, Ghimire, Kuber, MD, 4 g at 03/30/21 0814   doxycycline (VIBRA-TABS) tablet 100 mg, 100 mg, Oral, Q12H, Jimmye Norman, Jamiese M, MD, 100 mg at 03/31/21 2050   enoxaparin (LOVENOX) injection 40 mg, 40 mg, Subcutaneous, Daily, Mansy, Jan A, MD, 40 mg at 03/31/21 1032   fluticasone (FLONASE) 50 MCG/ACT nasal spray 2 spray, 2 spray, Each Nare, Daily PRN, Mansy, Jan A, MD, 2 spray at 03/31/21 0802   furosemide (LASIX) tablet 20 mg, 20 mg, Oral, BID, Jimmye Norman, Jamiese M, MD, 20 mg at 03/31/21 1759   guaiFENesin (MUCINEX) 12 hr tablet 600 mg, 600 mg, Oral, BID, Mansy, Jan A, MD, 600 mg at 03/31/21 2050   Ipratropium-Albuterol  (COMBIVENT) respimat 2 puff, 2 puff, Inhalation, Q4H PRN, Mansy, Jan A, MD   ipratropium-albuterol (DUONEB) 0.5-2.5 (3) MG/3ML nebulizer solution 3 mL, 3 mL, Nebulization, TID, Ghimire, Kuber, MD, 3 mL at 04/01/21 0808   magnesium hydroxide (MILK OF MAGNESIA) suspension 30 mL, 30 mL, Oral, Daily PRN, Mansy, Jan A, MD   meclizine (ANTIVERT) tablet 25 mg, 25 mg, Oral, TID PRN, Wyvonnia Dusky, MD, 25 mg at 03/27/21 1530   multivitamin with minerals tablet 1 tablet, 1 tablet, Oral, Daily, Mansy, Jan A, MD, 1 tablet at 03/31/21 1031   ondansetron (ZOFRAN) tablet 4 mg, 4 mg, Oral, Q6H PRN, 4 mg at 03/31/21 0752 **OR** ondansetron (ZOFRAN) injection 4 mg, 4 mg, Intravenous, Q6H PRN, Mansy, Jan A, MD   pantoprazole (PROTONIX) EC tablet 40 mg, 40 mg, Oral, Daily,  Mansy, Jan A, MD, 40 mg at 03/31/21 0938   predniSONE (DELTASONE) tablet 5 mg, 5 mg, Oral, Q breakfast, Dallie Piles, RPH, 5 mg at 03/31/21 1032   sodium chloride (OCEAN) 0.65 % nasal spray 1 spray, 1 spray, Nasal, PRN, Mansy, Jan A, MD   traZODone (DESYREL) tablet 25 mg, 25 mg, Oral, QHS PRN, Mansy, Jan A, MD, 25 mg at 03/22/21 2224    ALLERGIES   Methotrexate derivatives, Other, Ranitidine, Sulfasalazine, and Topiramate     REVIEW OF SYSTEMS    Review of Systems:  Gen:  Denies  fever, sweats, chills weigh loss  HEENT: Denies blurred vision, double vision, ear pain, eye pain, hearing loss, nose bleeds, sore throat Cardiac:  No dizziness, chest pain or heaviness, chest tightness,edema Resp:   Denies cough or sputum porduction, shortness of breath,wheezing, hemoptysis,  Gi: Denies swallowing difficulty, stomach pain, nausea or vomiting, diarrhea, constipation, bowel incontinence Gu:  Denies bladder incontinence, burning urine Ext:   Denies Joint pain, stiffness or swelling Skin: Denies  skin rash, easy bruising or bleeding or hives Endoc:  Denies polyuria, polydipsia , polyphagia or weight change Psych:   Denies depression,  insomnia or hallucinations   Other:  All other systems negative   VS: BP 130/70 (BP Location: Left Arm)    Pulse (!) 107    Temp 97.7 F (36.5 C) (Oral)    Resp 18    Ht 5\' 2"  (1.575 m)    Wt 63 kg    SpO2 93%    BMI 25.42 kg/m      PHYSICAL EXAM    GENERAL:NAD, no fevers, chills, no weakness no fatigue HEAD: Normocephalic, atraumatic.  EYES: Pupils equal, round, reactive to light. Extraocular muscles intact. No scleral icterus.  MOUTH: Moist mucosal membrane. Dentition intact. No abscess noted.  EAR, NOSE, THROAT: Clear without exudates. No external lesions.  NECK: Supple. No thyromegaly. No nodules. No JVD.  PULMONARY: Diffuse coarse rhonchi right sided +wheezes CARDIOVASCULAR: S1 and S2. Regular rate and rhythm. No murmurs, rubs, or gallops. No edema. Pedal pulses 2+ bilaterally.  GASTROINTESTINAL: Soft, nontender, nondistended. No masses. Positive bowel sounds. No hepatosplenomegaly.  MUSCULOSKELETAL: No swelling, clubbing, or edema. Range of motion full in all extremities.  NEUROLOGIC: Cranial nerves II through XII are intact. No gross focal neurological deficits. Sensation intact. Reflexes intact.  SKIN: No ulceration, lesions, rashes, or cyanosis. Skin warm and dry. Turgor intact.  PSYCHIATRIC: Mood, affect within normal limits. The patient is awake, alert and oriented x 3. Insight, judgment intact.       IMAGING    DG Chest 2 View  Result Date: 03/17/2021 CLINICAL DATA:  Shortness of breath. EXAM: CHEST - 2 VIEW COMPARISON:  March 11, 2021. FINDINGS: The heart size and mediastinal contours are within normal limits. Mild bibasilar subsegmental atelectasis is noted. Small pleural effusions may be present. Status post kyphoplasty at multiple levels of the thoracic spine. Multiple probable old compression fractures are noted as well. IMPRESSION: Mild bibasilar subsegmental atelectasis with small bilateral pleural effusions. Electronically Signed   By: Marijo Conception M.D.    On: 03/17/2021 15:36   DG Chest 2 View  Result Date: 03/11/2021 CLINICAL DATA:  Shortness of breath, cough EXAM: CHEST - 2 VIEW COMPARISON:  09/15/2020 FINDINGS: Cardiac size is within normal limits. Thoracic aorta is tortuous and ectatic. Increase in AP diameter of chest suggests COPD. There are no signs of alveolar pulmonary edema or focal pulmonary consolidation. There is improvement in aeration  of lower lung fields. There is no pleural effusion or pneumothorax. Osteopenia is seen in bony structures. There is decrease in height of multiple thoracic vertebral bodies. There is previous vertebroplasty in multiple thoracic vertebral bodies. IMPRESSION: COPD. There are no signs of pulmonary edema or focal pulmonary consolidation. Electronically Signed   By: Elmer Picker M.D.   On: 03/11/2021 09:43   CT Angio Chest PE W and/or Wo Contrast  Result Date: 03/18/2021 CLINICAL DATA:  Low oxygen saturation and elevated D-dimer. EXAM: CT ANGIOGRAPHY CHEST WITH CONTRAST TECHNIQUE: Multidetector CT imaging of the chest was performed using the standard protocol during bolus administration of intravenous contrast. Multiplanar CT image reconstructions and MIPs were obtained to evaluate the vascular anatomy. CONTRAST:  36mL OMNIPAQUE IOHEXOL 350 MG/ML SOLN COMPARISON:  June 16, 2019 FINDINGS: Cardiovascular: There is marked severity calcification of the thoracic aorta, without evidence of aortic aneurysm or dissection. Satisfactory opacification of the pulmonary arteries to the segmental level. No evidence of pulmonary embolism. Normal heart size. No pericardial effusion. Mediastinum/Nodes: No enlarged mediastinal, hilar, or axillary lymph nodes. Thyroid gland, trachea, and esophagus demonstrate no significant findings. Lungs/Pleura: There is mild central lobular emphysema. Mild atelectatic changes are seen within the posterior aspects of the bilateral lung bases, right greater than left. There is no evidence of a  pleural effusion or pneumothorax. Upper Abdomen: A 1.8 cm x 1.1 cm focus of parenchymal low attenuation is seen within the posterior aspect of the left lobe of the liver. Musculoskeletal: Compression fracture deformities of indeterminate age are seen involving the T6, T7, T8 and T9 vertebral bodies. This is most severe at the level of T7, which represents a new finding when compared to the prior study. Evidence of prior vertebroplasty is noted at the levels of T5, T11, T12, L1 and L2. Review of the MIP images confirms the above findings. IMPRESSION: 1. No evidence of pulmonary embolus. 2. Mild central lobular emphysema. 3. Compression fracture deformities of indeterminate age involving the T6, T7, T8 and T9 vertebral bodies, most severe at the level of T7. 4. Evidence of prior vertebroplasty at the levels of T5, T11, T12, L1 and L2. Aortic Atherosclerosis (ICD10-I70.0) and Emphysema (ICD10-J43.9). Electronically Signed   By: Virgina Norfolk M.D.   On: 03/18/2021 04:00   US Venous Img Lower Bilateral (DVT)  Result Date: 03/19/2021 CLINICAL DATA:  Shortness of breath, leg pain EXAM: BILATERAL LOWER EXTREMITY VENOUS DOPPLER ULTRASOUND TECHNIQUE: Gray-scale sonography with compression, as well as color and duplex ultrasound, were performed to evaluate the deep venous system(s) from the level of the common femoral vein through the popliteal and proximal calf veins. COMPARISON:  None. FINDINGS: VENOUS Normal compressibility of BILATERAL common femoral, superficial femoral, and popliteal veins, as well as the visualized calf veins. Visualized portions of BILATERAL profunda femoral vein and great saphenous vein unremarkable. No filling defects to suggest DVT on grayscale or color Doppler imaging. Doppler waveforms show normal direction of venous flow, normal respiratory plasticity and response to augmentation. OTHER None. Limitations: none IMPRESSION: No evidence of deep venous thrombosis in either lower extremity.  Electronically Signed   By: Lavonia Dana M.D.   On: 03/19/2021 09:59   DG Chest Port 1 View  Result Date: 03/31/2021 CLINICAL DATA:  Dyspnea. EXAM: PORTABLE CHEST 1 VIEW COMPARISON:  Chest x-ray dated March 27, 2021. FINDINGS: The heart size and mediastinal contours are within normal limits. Normal pulmonary vascularity. Mildly improved opacity at the right lung base. Unchanged left basilar atelectasis/scarring. Similar chronic blunting  of the right costophrenic angle. No pleural effusion or pneumothorax. No acute osseous abnormality. IMPRESSION: 1. Mildly improved right basilar atelectasis versus infiltrate. 2. Unchanged left basilar atelectasis/scarring. Electronically Signed   By: Titus Dubin M.D.   On: 03/31/2021 08:26   DG Chest Port 1 View  Result Date: 03/27/2021 CLINICAL DATA:  3 atelectasis EXAM: PORTABLE CHEST 1 VIEW COMPARISON:  03/24/2021 FINDINGS: Worsening opacity at the right base. Opacities hazy and streaky. Streaky density at the left base is well. Small right pleural effusion is likely. No pneumothorax. No cardiomegaly when accounting for mediastinal fat. Aortic tortuosity. Multilevel cement augmentation of the spine. IMPRESSION: Increased infiltrate or atelectasis at the lung bases. Electronically Signed   By: Jorje Guild M.D.   On: 03/27/2021 07:47   DG Chest Port 1 View  Result Date: 03/24/2021 CLINICAL DATA:  Difficulty breathing EXAM: PORTABLE CHEST 1 VIEW COMPARISON:  Previous studies including the examination of 03/21/2021 FINDINGS: Transverse diameter of heart is slightly increased. Thoracic aorta is tortuous and ectatic. There are small linear densities in the lower lung fields, more so on the left side with no significant interval change. There is no focal pulmonary consolidation. There is no significant pleural effusion or pneumothorax. IMPRESSION: Linear densities in the lower lung fields have not changed significantly. There are no new focal infiltrates or signs of  pulmonary edema. Electronically Signed   By: Elmer Picker M.D.   On: 03/24/2021 09:50   DG Chest Port 1 View  Result Date: 03/21/2021 CLINICAL DATA:  Shortness of breath EXAM: PORTABLE CHEST 1 VIEW COMPARISON:  Previous studies including the examination of 03/17/2021 FINDINGS: Cardiac size is unremarkable. Thoracic aorta is tortuous and ectatic. There are no signs of pulmonary edema. There is crowding of markings in the lower lung fields with interval improvement. No new focal infiltrates are seen. There is no pleural effusion or pneumothorax. There is evidence of vertebroplasty in the multiple thoracic vertebral bodies IMPRESSION: There is interval improvement in aeration of lower lung fields suggesting resolving subsegmental atelectasis. There are no new infiltrates or signs of pulmonary edema. Electronically Signed   By: Elmer Picker M.D.   On: 03/21/2021 12:06   ECHOCARDIOGRAM COMPLETE  Result Date: 03/20/2021    ECHOCARDIOGRAM REPORT   Patient Name:   Ashley Mack Date of Exam: 03/20/2021 Medical Rec #:  [id number]     Height:       62.0 in Accession #:    [id number]    Weight:       139.0 lb Date of Birth:  08/23/1945     BSA:          1.638 m Patient Age:    62 years      BP:           128/78 mmHg Patient Gender: F             HR:           112 bpm. Exam Location:  [facility] Procedure: 2D Echo, Color Doppler and Cardiac Doppler Indications:     R06.00 Dyspnea  History:         Patient has no prior history of Echocardiogram examinations.                  COPD, Signs/Symptoms:Shortness of Breath; Risk                  Factors:Hypertension. Positive for influenza type A.  Sonographer:     Charmayne Sheer Referring  Phys:  [id number] Barb Merino Diagnosing Phys: Isaias Cowman MD  Sonographer Comments: Suboptimal parasternal window and suboptimal subcostal window. Image acquisition challenging due to COPD. IMPRESSIONS  1. Left ventricular ejection fraction, by estimation, is 55 to 60%. The left  ventricle has normal function. The left ventricle has no regional wall motion abnormalities. Left ventricular diastolic parameters are consistent with Grade I diastolic dysfunction (impaired relaxation).  2. Right ventricular systolic function is normal. The right ventricular size is normal.  3. The mitral valve is normal in structure. Mild mitral valve regurgitation. No evidence of mitral stenosis.  4. The aortic valve is normal in structure. Aortic valve regurgitation is not visualized. No aortic stenosis is present.  5. The inferior vena cava is normal in size with greater than 50% respiratory variability, suggesting right atrial pressure of 3 mmHg. FINDINGS  Left Ventricle: Left ventricular ejection fraction, by estimation, is 55 to 60%. The left ventricle has normal function. The left ventricle has no regional wall motion abnormalities. The left ventricular internal cavity size was normal in size. There is  no left ventricular hypertrophy. Left ventricular diastolic parameters are consistent with Grade I diastolic dysfunction (impaired relaxation). Right Ventricle: The right ventricular size is normal. No increase in right ventricular wall thickness. Right ventricular systolic function is normal. Left Atrium: Left atrial size was normal in size. Right Atrium: Right atrial size was normal in size. Pericardium: There is no evidence of pericardial effusion. Mitral Valve: The mitral valve is normal in structure. Mild mitral valve regurgitation. No evidence of mitral valve stenosis. MV peak gradient, 5.3 mmHg. The mean mitral valve gradient is 3.0 mmHg. Tricuspid Valve: The tricuspid valve is normal in structure. Tricuspid valve regurgitation is mild . No evidence of tricuspid stenosis. Aortic Valve: The aortic valve is normal in structure. Aortic valve regurgitation is not visualized. No aortic stenosis is present. Aortic valve mean gradient measures 4.0 mmHg. Aortic valve peak gradient measures 7.2 mmHg. Aortic  valve area, by VTI measures 3.56 cm. Pulmonic Valve: The pulmonic valve was normal in structure. Pulmonic valve regurgitation is not visualized. No evidence of pulmonic stenosis. Aorta: The aortic root is normal in size and structure. Venous: The inferior vena cava is normal in size with greater than 50% respiratory variability, suggesting right atrial pressure of 3 mmHg. IAS/Shunts: No atrial level shunt detected by color flow Doppler.  LEFT VENTRICLE PLAX 2D LVOT diam:     2.20 cm     Diastology LV SV:         84          LV e' medial:    6.42 cm/s LV SV Index:   52          LV E/e' medial:  16.5 LVOT Area:     3.80 cm    LV e' lateral:   7.07 cm/s                            LV E/e' lateral: 15.0  LV Volumes (MOD) LV vol d, MOD A2C: 48.6 ml LV vol d, MOD A4C: 62.2 ml LV vol s, MOD A2C: 24.0 ml LV vol s, MOD A4C: 32.5 ml LV SV MOD A2C:     24.6 ml LV SV MOD A4C:     62.2 ml LV SV MOD BP:      28.9 ml RIGHT VENTRICLE RV Basal diam:  3.16 cm LEFT ATRIUM  Index        RIGHT ATRIUM           Index LA Vol (A2C):   26.6 ml 16.24 ml/m  RA Area:     10.30 cm LA Vol (A4C):   23.0 ml 14.04 ml/m  RA Volume:   21.80 ml  13.31 ml/m LA Biplane Vol: 26.3 ml 16.06 ml/m  AORTIC VALVE                    PULMONIC VALVE AV Area (Vmax):    3.49 cm     PV Vmax:       1.20 m/s AV Area (Vmean):   3.31 cm     PV Vmean:      83.500 cm/s AV Area (VTI):     3.56 cm     PV VTI:        0.205 m AV Vmax:           134.00 cm/s  PV Peak grad:  5.8 mmHg AV Vmean:          98.100 cm/s  PV Mean grad:  3.0 mmHg AV VTI:            0.237 m AV Peak Grad:      7.2 mmHg AV Mean Grad:      4.0 mmHg LVOT Vmax:         123.00 cm/s LVOT Vmean:        85.500 cm/s LVOT VTI:          0.222 m LVOT/AV VTI ratio: 0.94  AORTA Ao Root diam: 2.80 cm MITRAL VALVE MV Area (PHT): 7.09 cm     SHUNTS MV Area VTI:   5.62 cm     Systemic VTI:  0.22 m MV Peak grad:  5.3 mmHg     Systemic Diam: 2.20 cm MV Mean grad:  3.0 mmHg MV Vmax:       1.15 m/s MV  Vmean:      77.5 cm/s MV Decel Time: 107 msec MV E velocity: 106.00 cm/s MV A velocity: 113.00 cm/s MV E/A ratio:  0.94 Isaias Cowman MD Electronically signed by Isaias Cowman MD Signature Date/Time: 03/20/2021/1:43:48 PM    Final           ASSESSMENT/PLAN   Acute hypoxemic respiratory failure   Due to concomitant influenza A infection with COPD exacerbation and atelectasis with mild effusion of right pleural space.      - patient would benefit from pulmonary optimization  -c/w zithromax/rocepin>>>Doxycycline -he may be developing bacterial pneumonia, will obtain procalcitonin and start doxycycline -Arterial blood gas after weaning to 2L/min today please    Influenza A infetion   - Tamiflu   - stopped Tessalon to allow expectoration    - Dcd Tussinex hinders respiratory effort   - dcd pulmicort - patient on IV steroids                03/24/21-dcd steroids continue pred 5mg    Mild pulmonary edema  - lasix 20 IV bid- DCD    Acute on Chronic COPD    - continue Duoneb    - patient has allergy to Trelegy and Anoro    - continue steriods - reduced solumedrol to 40 once daily IV>>20>>dcd     - IS and flutter are at bedside   Bibasilar atelectasis    Patient would benefit from aggressive recruitment maneuvers -PT/OT should be daily    Thank you for allowing me  to participate in the care of this patient.  Total face to face encounter time for this patient visit was >45 min. >50% of the time was  spent in counseling and coordination of care.   Patient/Family are satisfied with care plan and all questions have been answered.  This document was prepared using Dragon voice recognition software and may include unintentional dictation errors.     Ottie Glazier, M.D.  Division of [place]

## 2021-04-01 NOTE — Progress Notes (Signed)
Patient has refused metaneb therapy. Patient states she had an intense headache after tx yesterday that lasted throughout the day and do not want to endure such today.

## 2021-04-01 NOTE — Discharge Summary (Addendum)
Physician Discharge Summary  Ashley Mack JHE:[id number] DOB: 01/20/1946 DOA: 03/18/2021  PCP: Kirk Ruths, MD  Admit date: 03/18/2021 Discharge date: 04/01/2021  Admitted From: home  Disposition:  home w/ home health   Recommendations for Outpatient Follow-up:  Follow up with PCP in 1-2 weeks F/u w/ pulmon., Dr. Lanney Gins, in 1-2 weeks  Home Health: yes Equipment/Devices: oxygen   Discharge Condition: stable  CODE STATUS: full  Diet recommendation: regular  Brief/Interim Summary: HPI was taken from Dr. Sidney Ace: Ashley Mack is a 76 y.o. female with medical history significant for hypertension, anxiety, GERD, migraine, rheumatoid arthritis, fatty liver and COPD, who presented to emergency room with acute onset of worsening dyspnea with associated congested productive cough and wheezing for the last week.  She denies any fever or chills.  No chest pain or palpitations.  She was seen in an urgent care and given a prescription for Levaquin on Tuesday for suspected bronchitis, as well as prednisone taper.  Her symptoms got only mildly better.  She admitted to chest tightness with no pleuritic pain.  No recent travels or surgeries, leg pain or edema.  No nausea or vomiting or abdominal pain or diarrhea.  ED Course: When she came to the ER blood pressure was 150/83 with a heart rate of 110 and pulse oximetry was 95% on 2 L of O2 by nasal cannula.  She later on required high flow nasal cannula 40% FiO2 with a pulse oximetry going up to 97%.  CMP showed hyponatremia and hypochloremia and albumin of 3.1.  CBC showed WC of 10.9 with neutrophilia.  Influenza A came back positive and influenza antigen was negative.  COVID-19 PCR came back negative.  D-dimer was significantly elevated at 6.44.  EKG as reviewed by me : EKG showed sinus tachycardia with a rate of 110 with Q waves anteroseptally. Imaging: 2 view chest x-ray showed mild bibasilar subsegmental atelectasis with small bilateral pleural  effusions.  Chest CTA revealed no evidence for PE.  It showed mild central lobular emphysema and compression fracture deformities of indeterminate age involving T6, T7, T8 and T9 vertebral bodies and most severe at the level of T7 with evidence of prior vertebroplasty at the levels of T5, T11, T12 and L1 as well as L2.  It showed aortic atherosclerosis.  Data patient was given p.o. Tamiflu and will be admitted to a PCU bed for further evaluation and management.  As per Dr. Sloan Leiter: 76 year old with hypertension, anxiety, visually impaired, GERD, migraine and COPD not on home oxygen presented with acute onset of worsening dyspnea with associated congestive productive cough and wheezing for 1 week.  Recently treated with Levaquin and prednisone taper.  In the emergency room blood pressure stable.  Heart rate 110.  95% on 2 L oxygen.  Subsequently required high flow nasal cannula oxygen.  Influenza A positive.  COVID-19 negative.  D-dimer was 6.44.  CTA with no evidence of PE.  Showed emphysema and compression fractures. Patient remains in the hospital due to persistent symptoms and shortness of breath not resolving.  Followed by pulmonary.   As per Dr. Jimmye Norman 1/11-1/17/23: Pt has continued to improved but still has shortness of breath especially w/ exertion. Pt was not able to be weaned off of supplemental oxygen so pt was d/c home w/ 2L [place]. Of note, pt was started on doxycycline by pulmon for worsening CXR on 03/29/21 and will be d/c w/ po doxycycline to complete the course at home. PT/OT evaluated the pt and  recommended home health. For more information, please see previous progress/consult notes.    Discharge Diagnoses:  Principal Problem:   COPD exacerbation ([place]) Active Problems:   COPD with acute exacerbation ([facility])  COPD exacerbation: likely secondary to influenza A. Completed tamiflu course. Improved shortness of breath today.  Continue on doxycycline, steroids & encourage incentive  spirometry. Repeat CXR shows improved right basilar atelectasis vs infiltrate. Pulmon recs apprec  Acute pulmonary edema: continue on po lasix. (Nurse/IV team unable to get another IV placed).  Monitor I/Os  Acute hypoxic respiratory failure: likely secondary to above. Continue on supplemental oxygen and wean as tolerated. D/c home w/ supplemental oxygen   Leukocytosis: resolved  Thrombocytosis: labile, etiology unclear. Will continue to monitor   Hyponatremia: labile     AKI:  resolved    Hypokalemia: potassium given   Discharge Instructions  Discharge Instructions     Diet general   Complete by: As directed    Discharge instructions   Complete by: As directed    F/u w/ PCP in 1-2 weeks. F/u w/ pulmon, Dr. Lanney Gins, in 1-2 weeks   Increase activity slowly   Complete by: As directed       Allergies as of 04/01/2021       Reactions   Methotrexate Derivatives Nausea And Vomiting, Other (See Comments)   Flu like symptoms   Other Nausea Only, Other (See Comments)   Arthritis medications . Unsure of what the med was.   Ranitidine Hives   Sulfasalazine Other (See Comments)   Stomach upset   Topiramate Nausea Only        Medication List     TAKE these medications    albuterol 108 (90 Base) MCG/ACT inhaler Commonly known as: VENTOLIN HFA Inhale 2 puffs into the lungs every 6 (six) hours as needed for wheezing or shortness of breath.   benzonatate 100 MG capsule Commonly known as: TESSALON Take 2 capsules (200 mg total) by mouth every 8 (eight) hours.   budesonide-formoterol 160-4.5 MCG/ACT inhaler Commonly known as: SYMBICORT Inhale 2 puffs into the lungs 2 (two) times daily.   cyanocobalamin 1000 MCG/ML injection Commonly known as: (VITAMIN B-12) Inject 1 mL (1,000 mcg total) into the muscle every 30 (thirty) days.   cyclobenzaprine 5 MG tablet Commonly known as: FLEXERIL Take 5 mg by mouth 2 (two) times daily.   doxycycline 100 MG tablet Commonly known  as: VIBRA-TABS Take 1 tablet (100 mg total) by mouth every 12 (twelve) hours for 2 days.   guaiFENesin-codeine 100-10 MG/5ML syrup Commonly known as: ROBITUSSIN AC Take 5 mLs by mouth 3 (three) times daily as needed for cough.   Integra 62.5-62.5-40-3 MG Caps Take 1 capsule by mouth daily.   levofloxacin 500 MG tablet Commonly known as: LEVAQUIN Take 1 tablet (500 mg total) by mouth daily. Hold this medication while taking doxycycline What changed: additional instructions   predniSONE 5 MG tablet Commonly known as: DELTASONE Take 5 mg by mouth daily.   Simply Saline 0.9 % Aers Generic drug: Saline Place 1 spray into the nose as needed (congestion).   Vitamin D3 50 MCG (2000 UT) capsule Take 2,000 Units by mouth daily.               Durable Medical Equipment  (From admission, onward)           Start     Ordered   04/01/21 0739  For home use only DME oxygen  Once  Question Answer Comment  Length of Need 12 Months   Mode or (Route) Nasal cannula   Liters per Minute 2   Frequency Continuous (stationary and portable oxygen unit needed)   Oxygen conserving device Yes   Oxygen delivery system Gas      04/01/21 0738   03/27/21 1032  For home use only DME Bedside commode  Once       Question:  Patient needs a bedside commode to treat with the following condition  Answer:  Generalized weakness   03/27/21 1031   03/27/21 1032  For home use only DME Walker rolling  Once       Question Answer Comment  Walker: With [place]   Patient needs a walker to treat with the following condition Generalized weakness      03/27/21 1031            Allergies  Allergen Reactions   Methotrexate Derivatives Nausea And Vomiting and Other (See Comments)    Flu like symptoms   Other Nausea Only and Other (See Comments)    Arthritis medications . Unsure of what the med was.   Ranitidine Hives   Sulfasalazine Other (See Comments)    Stomach upset   Topiramate  Nausea Only    Consultations: Pulmon    Procedures/Studies: DG Chest 2 View  Result Date: 03/17/2021 CLINICAL DATA:  Shortness of breath. EXAM: CHEST - 2 VIEW COMPARISON:  March 11, 2021. FINDINGS: The heart size and mediastinal contours are within normal limits. Mild bibasilar subsegmental atelectasis is noted. Small pleural effusions may be present. Status post kyphoplasty at multiple levels of the thoracic spine. Multiple probable old compression fractures are noted as well. IMPRESSION: Mild bibasilar subsegmental atelectasis with small bilateral pleural effusions. Electronically Signed   By: Marijo Conception M.D.   On: 03/17/2021 15:36   DG Chest 2 View  Result Date: 03/11/2021 CLINICAL DATA:  Shortness of breath, cough EXAM: CHEST - 2 VIEW COMPARISON:  09/15/2020 FINDINGS: Cardiac size is within normal limits. Thoracic aorta is tortuous and ectatic. Increase in AP diameter of chest suggests COPD. There are no signs of alveolar pulmonary edema or focal pulmonary consolidation. There is improvement in aeration of lower lung fields. There is no pleural effusion or pneumothorax. Osteopenia is seen in bony structures. There is decrease in height of multiple thoracic vertebral bodies. There is previous vertebroplasty in multiple thoracic vertebral bodies. IMPRESSION: COPD. There are no signs of pulmonary edema or focal pulmonary consolidation. Electronically Signed   By: Elmer Picker M.D.   On: 03/11/2021 09:43   CT Angio Chest PE W and/or Wo Contrast  Result Date: 03/18/2021 CLINICAL DATA:  Low oxygen saturation and elevated D-dimer. EXAM: CT ANGIOGRAPHY CHEST WITH CONTRAST TECHNIQUE: Multidetector CT imaging of the chest was performed using the standard protocol during bolus administration of intravenous contrast. Multiplanar CT image reconstructions and MIPs were obtained to evaluate the vascular anatomy. CONTRAST:  48mL OMNIPAQUE IOHEXOL 350 MG/ML SOLN COMPARISON:  June 16, 2019  FINDINGS: Cardiovascular: There is marked severity calcification of the thoracic aorta, without evidence of aortic aneurysm or dissection. Satisfactory opacification of the pulmonary arteries to the segmental level. No evidence of pulmonary embolism. Normal heart size. No pericardial effusion. Mediastinum/Nodes: No enlarged mediastinal, hilar, or axillary lymph nodes. Thyroid gland, trachea, and esophagus demonstrate no significant findings. Lungs/Pleura: There is mild central lobular emphysema. Mild atelectatic changes are seen within the posterior aspects of the bilateral lung bases, right greater than left.  There is no evidence of a pleural effusion or pneumothorax. Upper Abdomen: A 1.8 cm x 1.1 cm focus of parenchymal low attenuation is seen within the posterior aspect of the left lobe of the liver. Musculoskeletal: Compression fracture deformities of indeterminate age are seen involving the T6, T7, T8 and T9 vertebral bodies. This is most severe at the level of T7, which represents a new finding when compared to the prior study. Evidence of prior vertebroplasty is noted at the levels of T5, T11, T12, L1 and L2. Review of the MIP images confirms the above findings. IMPRESSION: 1. No evidence of pulmonary embolus. 2. Mild central lobular emphysema. 3. Compression fracture deformities of indeterminate age involving the T6, T7, T8 and T9 vertebral bodies, most severe at the level of T7. 4. Evidence of prior vertebroplasty at the levels of T5, T11, T12, L1 and L2. Aortic Atherosclerosis (ICD10-I70.0) and Emphysema (ICD10-J43.9). Electronically Signed   By: Virgina Norfolk M.D.   On: 03/18/2021 04:00   US Venous Img Lower Bilateral (DVT)  Result Date: 03/19/2021 CLINICAL DATA:  Shortness of breath, leg pain EXAM: BILATERAL LOWER EXTREMITY VENOUS DOPPLER ULTRASOUND TECHNIQUE: Gray-scale sonography with compression, as well as color and duplex ultrasound, were performed to evaluate the deep venous system(s) from  the level of the common femoral vein through the popliteal and proximal calf veins. COMPARISON:  None. FINDINGS: VENOUS Normal compressibility of BILATERAL common femoral, superficial femoral, and popliteal veins, as well as the visualized calf veins. Visualized portions of BILATERAL profunda femoral vein and great saphenous vein unremarkable. No filling defects to suggest DVT on grayscale or color Doppler imaging. Doppler waveforms show normal direction of venous flow, normal respiratory plasticity and response to augmentation. OTHER None. Limitations: none IMPRESSION: No evidence of deep venous thrombosis in either lower extremity. Electronically Signed   By: Lavonia Dana M.D.   On: 03/19/2021 09:59   DG Chest Port 1 View  Result Date: 03/31/2021 CLINICAL DATA:  Dyspnea. EXAM: PORTABLE CHEST 1 VIEW COMPARISON:  Chest x-ray dated March 27, 2021. FINDINGS: The heart size and mediastinal contours are within normal limits. Normal pulmonary vascularity. Mildly improved opacity at the right lung base. Unchanged left basilar atelectasis/scarring. Similar chronic blunting of the right costophrenic angle. No pleural effusion or pneumothorax. No acute osseous abnormality. IMPRESSION: 1. Mildly improved right basilar atelectasis versus infiltrate. 2. Unchanged left basilar atelectasis/scarring. Electronically Signed   By: Titus Dubin M.D.   On: 03/31/2021 08:26   DG Chest Port 1 View  Result Date: 03/27/2021 CLINICAL DATA:  3 atelectasis EXAM: PORTABLE CHEST 1 VIEW COMPARISON:  03/24/2021 FINDINGS: Worsening opacity at the right base. Opacities hazy and streaky. Streaky density at the left base is well. Small right pleural effusion is likely. No pneumothorax. No cardiomegaly when accounting for mediastinal fat. Aortic tortuosity. Multilevel cement augmentation of the spine. IMPRESSION: Increased infiltrate or atelectasis at the lung bases. Electronically Signed   By: Jorje Guild M.D.   On: 03/27/2021 07:47    DG Chest Port 1 View  Result Date: 03/24/2021 CLINICAL DATA:  Difficulty breathing EXAM: PORTABLE CHEST 1 VIEW COMPARISON:  Previous studies including the examination of 03/21/2021 FINDINGS: Transverse diameter of heart is slightly increased. Thoracic aorta is tortuous and ectatic. There are small linear densities in the lower lung fields, more so on the left side with no significant interval change. There is no focal pulmonary consolidation. There is no significant pleural effusion or pneumothorax. IMPRESSION: Linear densities in the lower lung fields have  not changed significantly. There are no new focal infiltrates or signs of pulmonary edema. Electronically Signed   By: Elmer Picker M.D.   On: 03/24/2021 09:50   DG Chest Port 1 View  Result Date: 03/21/2021 CLINICAL DATA:  Shortness of breath EXAM: PORTABLE CHEST 1 VIEW COMPARISON:  Previous studies including the examination of 03/17/2021 FINDINGS: Cardiac size is unremarkable. Thoracic aorta is tortuous and ectatic. There are no signs of pulmonary edema. There is crowding of markings in the lower lung fields with interval improvement. No new focal infiltrates are seen. There is no pleural effusion or pneumothorax. There is evidence of vertebroplasty in the multiple thoracic vertebral bodies IMPRESSION: There is interval improvement in aeration of lower lung fields suggesting resolving subsegmental atelectasis. There are no new infiltrates or signs of pulmonary edema. Electronically Signed   By: Elmer Picker M.D.   On: 03/21/2021 12:06   ECHOCARDIOGRAM COMPLETE  Result Date: 03/20/2021    ECHOCARDIOGRAM REPORT   Patient Name:   JISEL FLEET Date of Exam: 03/20/2021 Medical Rec #:  [id number]     Height:       62.0 in Accession #:    [id number]    Weight:       139.0 lb Date of Birth:  23-Nov-1945     BSA:          1.638 m Patient Age:    60 years      BP:           128/78 mmHg Patient Gender: F             HR:           112 bpm. Exam  Location:  [facility] Procedure: 2D Echo, Color Doppler and Cardiac Doppler Indications:     R06.00 Dyspnea  History:         Patient has no prior history of Echocardiogram examinations.                  COPD, Signs/Symptoms:Shortness of Breath; Risk                  Factors:Hypertension. Positive for influenza type A.  Sonographer:     Charmayne Sheer Referring Phys:  [id number] Barb Merino Diagnosing Phys: Isaias Cowman MD  Sonographer Comments: Suboptimal parasternal window and suboptimal subcostal window. Image acquisition challenging due to COPD. IMPRESSIONS  1. Left ventricular ejection fraction, by estimation, is 55 to 60%. The left ventricle has normal function. The left ventricle has no regional wall motion abnormalities. Left ventricular diastolic parameters are consistent with Grade I diastolic dysfunction (impaired relaxation).  2. Right ventricular systolic function is normal. The right ventricular size is normal.  3. The mitral valve is normal in structure. Mild mitral valve regurgitation. No evidence of mitral stenosis.  4. The aortic valve is normal in structure. Aortic valve regurgitation is not visualized. No aortic stenosis is present.  5. The inferior vena cava is normal in size with greater than 50% respiratory variability, suggesting right atrial pressure of 3 mmHg. FINDINGS  Left Ventricle: Left ventricular ejection fraction, by estimation, is 55 to 60%. The left ventricle has normal function. The left ventricle has no regional wall motion abnormalities. The left ventricular internal cavity size was normal in size. There is  no left ventricular hypertrophy. Left ventricular diastolic parameters are consistent with Grade I diastolic dysfunction (impaired relaxation). Right Ventricle: The right ventricular size is normal. No increase in right ventricular wall thickness. Right ventricular systolic  function is normal. Left Atrium: Left atrial size was normal in size. Right Atrium: Right atrial size was  normal in size. Pericardium: There is no evidence of pericardial effusion. Mitral Valve: The mitral valve is normal in structure. Mild mitral valve regurgitation. No evidence of mitral valve stenosis. MV peak gradient, 5.3 mmHg. The mean mitral valve gradient is 3.0 mmHg. Tricuspid Valve: The tricuspid valve is normal in structure. Tricuspid valve regurgitation is mild . No evidence of tricuspid stenosis. Aortic Valve: The aortic valve is normal in structure. Aortic valve regurgitation is not visualized. No aortic stenosis is present. Aortic valve mean gradient measures 4.0 mmHg. Aortic valve peak gradient measures 7.2 mmHg. Aortic valve area, by VTI measures 3.56 cm. Pulmonic Valve: The pulmonic valve was normal in structure. Pulmonic valve regurgitation is not visualized. No evidence of pulmonic stenosis. Aorta: The aortic root is normal in size and structure. Venous: The inferior vena cava is normal in size with greater than 50% respiratory variability, suggesting right atrial pressure of 3 mmHg. IAS/Shunts: No atrial level shunt detected by color flow Doppler.  LEFT VENTRICLE PLAX 2D LVOT diam:     2.20 cm     Diastology LV SV:         84          LV e' medial:    6.42 cm/s LV SV Index:   52          LV E/e' medial:  16.5 LVOT Area:     3.80 cm    LV e' lateral:   7.07 cm/s                            LV E/e' lateral: 15.0  LV Volumes (MOD) LV vol d, MOD A2C: 48.6 ml LV vol d, MOD A4C: 62.2 ml LV vol s, MOD A2C: 24.0 ml LV vol s, MOD A4C: 32.5 ml LV SV MOD A2C:     24.6 ml LV SV MOD A4C:     62.2 ml LV SV MOD BP:      28.9 ml RIGHT VENTRICLE RV Basal diam:  3.16 cm LEFT ATRIUM             Index        RIGHT ATRIUM           Index LA Vol (A2C):   26.6 ml 16.24 ml/m  RA Area:     10.30 cm LA Vol (A4C):   23.0 ml 14.04 ml/m  RA Volume:   21.80 ml  13.31 ml/m LA Biplane Vol: 26.3 ml 16.06 ml/m  AORTIC VALVE                    PULMONIC VALVE AV Area (Vmax):    3.49 cm     PV Vmax:       1.20 m/s AV Area  (Vmean):   3.31 cm     PV Vmean:      83.500 cm/s AV Area (VTI):     3.56 cm     PV VTI:        0.205 m AV Vmax:           134.00 cm/s  PV Peak grad:  5.8 mmHg AV Vmean:          98.100 cm/s  PV Mean grad:  3.0 mmHg AV VTI:            0.237 m AV Peak  Grad:      7.2 mmHg AV Mean Grad:      4.0 mmHg LVOT Vmax:         123.00 cm/s LVOT Vmean:        85.500 cm/s LVOT VTI:          0.222 m LVOT/AV VTI ratio: 0.94  AORTA Ao Root diam: 2.80 cm MITRAL VALVE MV Area (PHT): 7.09 cm     SHUNTS MV Area VTI:   5.62 cm     Systemic VTI:  0.22 m MV Peak grad:  5.3 mmHg     Systemic Diam: 2.20 cm MV Mean grad:  3.0 mmHg MV Vmax:       1.15 m/s MV Vmean:      77.5 cm/s MV Decel Time: 107 msec MV E velocity: 106.00 cm/s MV A velocity: 113.00 cm/s MV E/A ratio:  0.94 Isaias Cowman MD Electronically signed by Isaias Cowman MD Signature Date/Time: 03/20/2021/1:43:48 PM    Final    (Echo, Carotid, EGD, Colonoscopy, ERCP)    Subjective: Pt c/o malaise    Discharge Exam: Vitals:   04/01/21 0812 04/01/21 0842  BP:  130/70  Pulse:  (!) 107  Resp:  18  Temp:  97.7 F (36.5 C)  SpO2: 93% 93%   Vitals:   03/31/21 2001 04/01/21 0437 04/01/21 0812 04/01/21 0842  BP:  138/74  130/70  Pulse:  (!) 106  (!) 107  Resp:  18  18  Temp:  98 F (36.7 C)  97.7 F (36.5 C)  TempSrc:    Oral  SpO2: 97% 94% 93% 93%  Weight:      Height:        General: Pt is alert, awake, not in acute distress Cardiovascular: S1/S2 +, no rubs, no gallops Respiratory: diminished breath sounds b/l  Abdominal: Soft, NT, ND, bowel sounds + Extremities: no edema, no cyanosis    The results of significant diagnostics from this hospitalization (including imaging, microbiology, ancillary and laboratory) are listed below for reference.     Microbiology: No results found for this or any previous visit (from the past 240 hour(s)).   Labs: BNP (last 3 results) No results for input(s): BNP in the last 8760 hours. Basic  Metabolic Panel: Recent Labs  Lab 03/28/21 0533 03/29/21 0501 03/30/21 0447 03/31/21 0425 04/01/21 0552  NA 136 135 134* 134* 132*  K 3.7 3.2* 3.5 3.6 3.4*  CL 92* 91* 92* 89* 88*  CO2 34* 35* 33* 35* 34*  GLUCOSE 123* 118* 107* 89 154*  BUN 31* 23 17 16 20   CREATININE 0.92 0.78 0.93 0.92 1.20*  CALCIUM 8.4* 8.8* 8.9 8.8* 8.8*   Liver Function Tests: No results for input(s): AST, ALT, ALKPHOS, BILITOT, PROT, ALBUMIN in the last 168 hours. No results for input(s): LIPASE, AMYLASE in the last 168 hours. No results for input(s): AMMONIA in the last 168 hours. CBC: Recent Labs  Lab 03/28/21 0533 03/29/21 0501 03/30/21 0447 03/31/21 0425 04/01/21 0552  WBC 14.6* 10.2 9.4 11.1* 9.9  HGB 10.1* 9.9* 10.2* 11.0* 11.3*  HCT 30.9* 30.7* 31.5* 33.9* 35.0*  MCV 85.1 83.0 84.5 82.9 83.3  PLT 397 436* 467* 495* 500*   Cardiac Enzymes: No results for input(s): CKTOTAL, CKMB, CKMBINDEX, TROPONINI in the last 168 hours. BNP: Invalid input(s): POCBNP CBG: No results for input(s): GLUCAP in the last 168 hours. D-Dimer No results for input(s): DDIMER in the last 72 hours. Hgb A1c No results for input(s): HGBA1C in  the last 72 hours. Lipid Profile No results for input(s): CHOL, HDL, LDLCALC, TRIG, CHOLHDL, LDLDIRECT in the last 72 hours. Thyroid function studies No results for input(s): TSH, T4TOTAL, T3FREE, THYROIDAB in the last 72 hours.  Invalid input(s): FREET3 Anemia work up No results for input(s): VITAMINB12, FOLATE, FERRITIN, TIBC, IRON, RETICCTPCT in the last 72 hours. Urinalysis    Component Value Date/Time   COLORURINE STRAW (A) 08/06/2017 1121   APPEARANCEUR CLEAR (A) 08/06/2017 1121   LABSPEC 1.004 (L) 08/06/2017 1121   PHURINE 7.0 08/06/2017 1121   GLUCOSEU NEGATIVE 08/06/2017 1121   HGBUR SMALL (A) 08/06/2017 1121   BILIRUBINUR NEGATIVE 08/06/2017 1121   KETONESUR 20 (A) 08/06/2017 1121   PROTEINUR NEGATIVE 08/06/2017 1121   NITRITE NEGATIVE 08/06/2017 1121    LEUKOCYTESUR NEGATIVE 08/06/2017 1121   Sepsis Labs Invalid input(s): PROCALCITONIN,  WBC,  LACTICIDVEN Microbiology No results found for this or any previous visit (from the past 240 hour(s)).   Time coordinating discharge: Over 30 minutes  SIGNED:   Wyvonnia Dusky, MD  [facility] 04/01/2021, 12:23 PM Pager   If 7PM-7AM, please contact night-coverage

## 2021-04-01 NOTE — Progress Notes (Signed)
Occupational Therapy Treatment Patient Details Name: Ashley Mack MRN: [id number] DOB: 01/14/1946 Today's Date: 04/01/2021   History of present illness Pt is a 76 y.o. female with medical history significant for hypertension, anxiety, GERD, migraine, rheumatoid arthritis, fatty liver and COPD, who presented to emergency room with acute onset of worsening dyspnea with associated congested productive cough and wheezing for the last week. Workup +flu.   OT comments  Pt seen for OT treatment on this date. Upon arrival to room, pt awake and sitting upright in bed. Pt reported feeling more energized compared to yesterday and agreeable to OT tx. Pt currently requires SUPERVISION for bed mobility, SUPERVISION/SET-UP for seated UB bathing/dressing, MIN GUARD for sit>stand LB bathing, and SUPERVISION for functional mobility of short household distances with RW d/t decreased strength and activity tolerance. Pt is making good progress toward goals and continues to benefit from skilled OT services to maximize return to PLOF and minimize risk of future falls, injury, caregiver burden, and readmission. Will continue to follow POC. Discharge recommendation remains appropriate.     Recommendations for follow up therapy are one component of a multi-disciplinary discharge planning process, led by the attending physician.  Recommendations may be updated based on patient status, additional functional criteria and insurance authorization.    Follow Up Recommendations  Home health OT    Assistance Recommended at Discharge Frequent or constant Supervision/Assistance  Patient can return home with the following  A little help with walking and/or transfers;A little help with bathing/dressing/bathroom;Assistance with cooking/housework;Help with stairs or ramp for entrance   Equipment Recommendations  BSC/3in1       Precautions / Restrictions Precautions Precautions: Fall Restrictions Weight Bearing Restrictions: No        Mobility Bed Mobility Overal bed mobility: Needs Assistance Bed Mobility: Supine to Sit     Supine to sit: HOB elevated, Supervision          Transfers Overall transfer level: Needs assistance Equipment used: Rolling walker (2 wheels) Transfers: Sit to/from Stand, Bed to chair/wheelchair/BSC Sit to Stand: Supervision Stand pivot transfers: Supervision               Balance Overall balance assessment: Needs assistance Sitting-balance support: No upper extremity supported, Feet unsupported Sitting balance-Leahy Scale: Good Sitting balance - Comments: good sitting balance reaching within BOS on BSC   Standing balance support: Single extremity supported, During functional activity Standing balance-Leahy Scale: Fair Standing balance comment: Requires MIN GUARD for sit>stand LB bathing                           ADL either performed or assessed with clinical judgement   ADL Overall ADL's : Needs assistance/impaired     Grooming: Wash/dry face;Supervision/safety;Set up;Sitting   Upper Body Bathing: Supervision/ safety;Set up;Standing   Lower Body Bathing: Min guard;Sit to/from stand   Upper Body Dressing : Set up;Sitting Upper Body Dressing Details (indicate cue type and reason): to don/doff hospital gown     Toilet Transfer: Supervision/safety;Stand-pivot;BSC/3in1   Toileting- Clothing Manipulation and Hygiene: Supervision/safety;Set up;Sitting/lateral lean       Functional mobility during ADLs: Supervision/safety;Rolling walker (2 wheels) (to walk 79ft)        Cognition Arousal/Alertness: Awake/alert Behavior During Therapy: WFL for tasks assessed/performed Overall Cognitive Status: Within Functional Limits for tasks assessed  General Comments SpO2>92%, elevated HR to 120s    Pertinent Vitals/ Pain       Pain Assessment Pain Assessment: No/denies pain          Frequency  Min 2X/week        Progress Toward Goals  OT Goals(current goals can now be found in the care plan section)  Progress towards OT goals: Progressing toward goals  Acute Rehab OT Goals Patient Stated Goal: to go home OT Goal Formulation: With patient/family Time For Goal Achievement: 04/03/21 Potential to Achieve Goals: Good  Plan Discharge plan remains appropriate;Frequency remains appropriate       AM-PAC OT "6 Clicks" Daily Activity     Outcome Measure   Help from another person eating meals?: None Help from another person taking care of personal grooming?: A Little Help from another person toileting, which includes using toliet, bedpan, or urinal?: A Little Help from another person bathing (including washing, rinsing, drying)?: A Little Help from another person to put on and taking off regular upper body clothing?: None Help from another person to put on and taking off regular lower body clothing?: A Little 6 Click Score: 20    End of Session Equipment Utilized During Treatment: Oxygen;Rolling walker (2 wheels)  OT Visit Diagnosis: Other abnormalities of gait and mobility (R26.89)   Activity Tolerance Patient tolerated treatment well   Patient Left in chair;with call bell/phone within reach;with chair alarm set;with family/visitor present   Nurse Communication Mobility status        Time: 5859-2924 OT Time Calculation (min): 32 min  Charges: OT General Charges $OT Visit: 1 Visit OT Treatments $Self Care/Home Management : 23-37 mins  Fredirick Maudlin, OTR/L [place]

## 2021-04-01 NOTE — Care Management Important Message (Signed)
Important Message  Patient Details  Name: Ashley Mack MRN: [id number] Date of Birth: [birth year]   Medicare Important Message Given:  Yes     Dannette Barbara 04/01/2021, 11:33 AM

## 2021-04-01 NOTE — Progress Notes (Signed)
PT Cancellation Note  Patient Details Name: Ashley Mack MRN: [id number] DOB: [birth year]   Cancelled Treatment:    Reason Eval/Treat Not Completed: Other (comment). Pt politely declining PT at this time, just recently worked with OT for all her ADLs, and up in recliner. PT and pt discussed discharge home and equipment needs, CSW notified.   Lieutenant Diego PT, DPT 11:36 AM,04/01/21

## 2021-04-02 ENCOUNTER — Other Ambulatory Visit: Payer: Self-pay | Admitting: Oncology

## 2021-04-24 ENCOUNTER — Other Ambulatory Visit: Payer: Medicare Other

## 2021-05-01 ENCOUNTER — Ambulatory Visit: Payer: Medicare Other | Admitting: Nurse Practitioner

## 2021-05-01 ENCOUNTER — Ambulatory Visit: Payer: Medicare Other

## 2021-05-28 ENCOUNTER — Other Ambulatory Visit: Payer: Medicare Other

## 2021-05-29 ENCOUNTER — Ambulatory Visit: Payer: Medicare Other | Admitting: Nurse Practitioner

## 2021-07-20 ENCOUNTER — Ambulatory Visit
Admission: EM | Admit: 2021-07-20 | Discharge: 2021-07-20 | Disposition: A | Discharge status: Home / Self Care | Payer: Medicare Other

## 2021-07-20 DIAGNOSIS — M25511 Pain in right shoulder: Secondary | ICD-10-CM

## 2021-07-20 DIAGNOSIS — K760 Fatty (change of) liver, not elsewhere classified: Secondary | ICD-10-CM | POA: Insufficient documentation

## 2021-07-20 DIAGNOSIS — K469 Unspecified abdominal hernia without obstruction or gangrene: Secondary | ICD-10-CM | POA: Insufficient documentation

## 2021-07-20 NOTE — ED Notes (Signed)
Refused 2nd BP, unless needed by provider.  ?

## 2021-07-20 NOTE — Discharge Instructions (Signed)
He is go to [facility] for further management and cortisone injection, address is listed below ?

## 2021-07-20 NOTE — ED Provider Notes (Signed)
[facility]    CSN: [id number] Arrival date & time: 07/20/21  1150      History   Chief Complaint Chief Complaint  Patient presents with   Shoulder Pain    HPI Ashley Mack is a 76 y.o. female.   Presents with acute right shoulder pain which she endorses is related to her rheumatoid arthritis.  Pain is sharp and achy, does not radiate.  Worsen when arm lifted above head.  No associated numbness or tingling.  Patient has been receiving cortisone injections as needed for management of discomfort.  Unable to see her physician as it is the weekend.    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)    Iron (Fe) deficiency anemia    Migraine headache    in past   Migraines    Motion sickness    No natural teeth    Osteoporosis    Rheumatoid arthritis (HCC)    Vertigo     Patient Active Problem List   Diagnosis Date Noted   Fatty liver 07/20/2021   Hernia 07/20/2021   COPD with acute exacerbation (HCC) 03/19/2021   COPD exacerbation (HCC) 03/18/2021   Thoracic disc herniation 11/06/2019   Arthropathy of thoracic facet joint 11/06/2019   Cervical facet joint syndrome 11/06/2019   Statin declined 11/02/2019   Closed wedge compression fracture of T8 vertebra (HCC) 09/20/2019   Elevated blood-pressure reading, without diagnosis of hypertension 09/20/2019   GERD (gastroesophageal reflux disease) 06/18/2019   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HCC) 06/18/2019   H/O kyphoplasty 06/18/2019   Compression fracture of body of thoracic vertebra (HCC) 06/16/2019   At high risk for falls 05/05/2018   Pain in thoracic spine 11/18/2017    Flank pain    Intractable back pain 08/08/2017   Compression fracture of lumbar vertebra (HCC) 08/08/2017   Abdominal pain, RUQ 08/06/2017   Healthcare maintenance 05/06/2017   Guaiac positive stools 11/26/2016   Iron deficiency anemia 09/01/2016   B12 deficiency 08/31/2016   Normocytic anemia 08/30/2016   Centrilobular emphysema (HCC) 05/09/2015   Chronic fatigue 06/21/2014   SOB (shortness of breath) on exertion 06/21/2014   Spondylosis of thoracic region without myelopathy or radiculopathy 01/22/2014   Aortic atherosclerosis (HCC) 12/25/2013   HTN (hypertension), benign 12/10/2013   Chronic insomnia 12/10/2013   Migraines 12/10/2013   Osteoporosis 08/03/2013   Retinal disease 08/03/2013   Scotoma involving central area in visual field 03/29/2012    Past Surgical History:  Procedure Laterality Date   CATARACT EXTRACTION W/PHACO Right 08/12/2016   Procedure: CATARACT EXTRACTION PHACO AND INTRAOCULAR LENS PLACEMENT (IOC)  Right;  Surgeon: Lockie Mola, MD;  Location: [facility];  Service: Ophthalmology;  Laterality: Right;   CATARACT EXTRACTION W/PHACO Left 09/30/2016   Procedure: CATARACT EXTRACTION PHACO AND INTRAOCULAR LENS PLACEMENT (IOC) Left;  Surgeon: Lockie Mola, MD;  Location: [facility];  Service: Ophthalmology;  Laterality: Left;   COLONOSCOPY     COLONOSCOPY WITH PROPOFOL N/A 12/09/2016   Procedure: COLONOSCOPY WITH PROPOFOL;  Surgeon: Norma Fredrickson, Boykin Nearing, MD;  Location: [facility];  Service: Endoscopy;  Laterality: N/A;   ESOPHAGOGASTRODUODENOSCOPY     ESOPHAGOGASTRODUODENOSCOPY (EGD) WITH PROPOFOL N/A 12/09/2016   Procedure: ESOPHAGOGASTRODUODENOSCOPY (EGD) WITH PROPOFOL;  Surgeon: Toledo, Boykin Nearing, MD;  Location: [facility];  Service: Endoscopy;  Laterality: N/A;   KYPHOPLASTY N/A 08/10/2017   Procedure: Nicki Reaper;  Surgeon: Kennedy Bucker, MD;  Location: [facility];  Service: Orthopedics;  Laterality: N/A;   KYPHOPLASTY N/A  09/09/2017   Procedure: [id number]-N5,A2;  Surgeon: Kennedy Bucker, MD;  Location: [facility];  Service: Orthopedics;  Laterality: N/A;   KYPHOPLASTY N/A 09/28/2017   Procedure: [id number];  Surgeon: Kennedy Bucker, MD;  Location: [facility];  Service: Orthopedics;  Laterality: N/A;   KYPHOPLASTY N/A 06/19/2019   Procedure: KYPHOPLASTY T5;  Surgeon: Kennedy Bucker, MD;  Location: [facility];  Service: Orthopedics;  Laterality: N/A;   OOPHORECTOMY Bilateral 2003   RIGHT/LEFT HEART CATH AND CORONARY ANGIOGRAPHY Bilateral 10/19/2019   Procedure: RIGHT/LEFT HEART CATH AND CORONARY ANGIOGRAPHY;  Surgeon: Alwyn Pea, MD;  Location: [facility];  Service: Cardiovascular;  Laterality: Bilateral;    OB History     Gravida  1   Para      Term      Preterm      AB  1   Living         SAB      IAB      Ectopic      Multiple      Live Births               Home Medications    Prior to Admission medications   Medication Sig Start Date End Date Taking? Authorizing Provider  albuterol (VENTOLIN HFA) 108 (90 Base) MCG/ACT inhaler Inhale 2 puffs into the lungs every 6 (six) hours as needed for wheezing or shortness of breath.     [provider]  amoxicillin-clavulanate (AUGMENTIN) 875-125 MG tablet Take 1 tablet by mouth 2 (two) times daily. 05/28/21   [provider]  benzonatate (TESSALON) 100 MG capsule Take 2 capsules (200 mg total) by mouth every 8 (eight) hours. 03/11/21   Becky Augusta, NP  Sherrie Sport COVID-19 AG HOME TEST KIT Use as Directed on the Package 07/03/21   [provider]  budesonide-formoterol (SYMBICORT) 160-4.5 MCG/ACT inhaler Inhale 2 puffs into the lungs 2 (two) times daily.    [provider]  Cholecalciferol (VITAMIN D3) 2000 UNITS capsule Take 2,000 Units by mouth daily.     [provider]  cyanocobalamin (,VITAMIN B-12,) 1000 MCG/ML injection INJECT INTRAMUSCULARLY EVERY 30 DAYS 04/02/21   Rickard Patience, MD   cyclobenzaprine (FLEXERIL) 5 MG tablet Take 5 mg by mouth 2 (two) times daily.    [provider]  Fe Fum-FePoly-Vit C-Vit B3 (INTEGRA) 62.5-62.5-40-3 MG CAPS Take 1 capsule by mouth daily. 11/29/13   [provider]  guaiFENesin-codeine (ROBITUSSIN AC) 100-10 MG/5ML syrup Take 5 mLs by mouth 3 (three) times daily as needed for cough. 03/11/21   Becky Augusta, NP  hydrocortisone 2.5 % ointment Apply topically. 06/23/21   [provider]  levofloxacin (LEVAQUIN) 500 MG tablet Take 1 tablet (500 mg total) by mouth daily. Hold this medication while taking doxycycline 04/01/21   Charise Killian, MD  ondansetron [facility]) 4 MG tablet  05/28/21   [provider]  predniSONE (DELTASONE) 5 MG tablet Take 5 mg by mouth daily.     [provider]  Saline (SIMPLY SALINE) 0.9 % AERS Place  1 spray into the nose as needed (congestion).     [provider]  tacrolimus (PROTOPIC) 0.1 % ointment Apply topically. 06/23/21   [provider]    Family History Family History  Problem Relation Age of Onset   Kidney disease Mother    Diabetes Mellitus II Brother     Social History Social History   Tobacco Use   Smoking status: Former    Packs/day: 1.00    Types: Cigarettes    Quit date: 2009    Years since quitting: 14.3   Smokeless tobacco: Never  Vaping Use   Vaping Use: Never used  Substance Use Topics   Alcohol use: No   Drug use: No     Allergies   Methotrexate derivatives, Other, Ranitidine, Sulfasalazine, and Topiramate   Review of Systems Review of Systems   Physical Exam Triage Vital Signs ED Triage Vitals  Enc Vitals Group     BP 07/20/21 1159 (!) 134/116     Pulse Rate 07/20/21 1159 (!) 110     Resp 07/20/21 1159 (!) 24     Temp 07/20/21 1159 98.7 F (37.1 C)     Temp Source 07/20/21 1159 Oral     SpO2 07/20/21 1159 100 %     Weight 07/20/21 1157 138 lb 14.2 oz (63 kg)     Height 07/20/21 1157 5\' 2"  (1.575 m)      Head Circumference --      Peak Flow --      Pain Score 07/20/21 1154 10     Pain Loc --      Pain Edu? --      Excl. in GC? --    No data found.  Updated Vital Signs BP (!) 134/116 (BP Location: Left Arm)   Pulse (!) 110   Temp 98.7 F (37.1 C) (Oral)   Resp (!) 24   Ht 5\' 2"  (1.575 m)   Wt 138 lb 14.2 oz (63 kg)   SpO2 100%   BMI 25.40 kg/m   Visual Acuity Right Eye Distance:   Left Eye Distance:   Bilateral Distance:    Right Eye Near:   Left Eye Near:    Bilateral Near:     Physical Exam   UC Treatments / Results  Labs (all labs ordered are listed, but only abnormal results are displayed) Labs Reviewed - No data to display  EKG   Radiology No results found.  Procedures Procedures (including critical care time)  Medications Ordered in UC Medications - No data to display  Initial Impression / Assessment and Plan / UC Course  I have reviewed the triage vital signs and the nursing notes.  Pertinent labs & imaging results that were available during my care of the patient were reviewed by me and considered in my medical decision making (see chart for details).  Acute pain of the right shoulder  Unable to complete joint injection today due to capabilities of the urgent care, discussed with patient, given walking referral to [facility] for evaluation to date and further management of symptoms Final Clinical Impressions(s) / UC Diagnoses   Final diagnoses:  None   Discharge Instructions   None    ED Prescriptions   None    PDMP not reviewed this encounter.   Valinda Hoar, [place] 07/20/21 1218

## 2021-07-20 NOTE — ED Triage Notes (Signed)
Patient is here for "Right shoulder pain, history of this pain, recurrent". Normally seen at [facility] for this "can't wait til Monday". History of RA. No recent injury.  ?

## 2021-08-08 ENCOUNTER — Ambulatory Visit
Admission: EM | Admit: 2021-08-08 | Discharge: 2021-08-08 | Disposition: A | Discharge status: Home / Self Care | Payer: Medicare Other | Attending: Physician Assistant | Admitting: Physician Assistant

## 2021-08-08 ENCOUNTER — Encounter: Payer: Self-pay | Admitting: Emergency Medicine

## 2021-08-08 ENCOUNTER — Ambulatory Visit (INDEPENDENT_AMBULATORY_CARE_PROVIDER_SITE_OTHER): Payer: Medicare Other

## 2021-08-08 DIAGNOSIS — M25522 Pain in left elbow: Secondary | ICD-10-CM

## 2021-08-08 DIAGNOSIS — S5002XA Contusion of left elbow, initial encounter: Secondary | ICD-10-CM

## 2021-08-08 DIAGNOSIS — L03114 Cellulitis of left upper limb: Secondary | ICD-10-CM | POA: Diagnosis not present

## 2021-08-08 MED ORDER — CEPHALEXIN 500 MG PO CAPS: 500.0000 mg | ORAL_CAPSULE | Freq: Four times a day (QID) | ORAL | 0 refills | Status: AC

## 2021-08-08 NOTE — ED Triage Notes (Addendum)
Patient comes in c/o left elbow pain, swelling, bruising that started last night after hitting. Denies any pain or difficulty straightening arm. Only has pain when she rests her elbow down.

## 2021-08-08 NOTE — ED Provider Notes (Signed)
[facility]    CSN: [id number] Arrival date & time: 08/08/21  1612      History   Chief Complaint Chief Complaint  Patient presents with   Extremity Pain    HPI Ashley Mack is a 76 y.o. female presenting for left elbow pain, swelling and bruising since last night.  Patient says she already had a knot in the area which she thinks was a small abscess or infection.  She says the banged her elbow on something last night and since then has had increased pain and difficulty straightening her arm and bending it due to the pain.  Patient wonders if she needs to have the swelling drained today.  She has not iced the elbow.  Denies any numbness notes.  No other injuries.  Medical history significant for COPD.  Patient is on continuous oxygen.  Other medical history significant for rheumatoid arthritis, osteoporosis and autoimmune retinopathy.  Patient has very poor vision.  She says this is what caused her to actively bang her elbow.  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)    Iron (Fe) deficiency anemia    Migraine headache    in past   Migraines    Motion sickness    No natural teeth    Osteoporosis    Rheumatoid arthritis ([place])    Vertigo     Patient Active Problem List   Diagnosis Date Noted   Fatty liver 07/20/2021   Hernia 07/20/2021   COPD with acute exacerbation ([place]) 03/19/2021   COPD exacerbation ([place]) 03/18/2021   Thoracic disc herniation 11/06/2019   Arthropathy of thoracic facet joint 11/06/2019   Cervical facet joint syndrome 11/06/2019   Statin declined 11/02/2019   Closed wedge compression fracture of T8  vertebra ([place]) 09/20/2019   Elevated blood-pressure reading, without diagnosis of hypertension 09/20/2019   GERD (gastroesophageal reflux disease) 06/18/2019   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[place]) 06/18/2019   H/O kyphoplasty 06/18/2019   Compression fracture of body of thoracic vertebra ([place]) 06/16/2019   At high risk for falls 05/05/2018   Pain in thoracic spine 11/18/2017   Flank pain    Intractable back pain 08/08/2017   Compression fracture of lumbar vertebra ([place]) 08/08/2017   Abdominal pain, RUQ 08/06/2017   Healthcare maintenance 05/06/2017   Guaiac positive stools 11/26/2016   Iron deficiency anemia 09/01/2016   B12 deficiency 08/31/2016   Normocytic anemia 08/30/2016   Centrilobular emphysema ([place]) 05/09/2015   Chronic fatigue 06/21/2014   SOB (shortness of breath) on exertion 06/21/2014   Spondylosis of thoracic region without myelopathy or radiculopathy 01/22/2014   Aortic atherosclerosis ([place]) 12/25/2013   HTN (hypertension), benign 12/10/2013   Chronic insomnia 12/10/2013   Migraines 12/10/2013   Osteoporosis 08/03/2013   Retinal disease 08/03/2013   Scotoma involving central area in visual field 03/29/2012    Past Surgical History:  Procedure Laterality Date   CATARACT EXTRACTION W/PHACO Right 08/12/2016   Procedure: CATARACT EXTRACTION PHACO AND INTRAOCULAR LENS PLACEMENT ([place])  Right;  Surgeon: Leandrew Koyanagi, MD;  Location: [place];  Service: Ophthalmology;  Laterality: Right;   CATARACT EXTRACTION W/PHACO Left 09/30/2016   Procedure: CATARACT EXTRACTION PHACO AND INTRAOCULAR LENS PLACEMENT ([place]) Left;  Surgeon: Leandrew Koyanagi, MD;  Location: [place];  Service: Ophthalmology;  Laterality: Left;   COLONOSCOPY     COLONOSCOPY WITH PROPOFOL N/A 12/09/2016   Procedure: COLONOSCOPY WITH PROPOFOL;  Surgeon: Toledo, Benay Pike, MD;  Location: [facility];  Service: Endoscopy;  Laterality: N/A;    ESOPHAGOGASTRODUODENOSCOPY     ESOPHAGOGASTRODUODENOSCOPY (EGD) WITH PROPOFOL N/A 12/09/2016   Procedure: ESOPHAGOGASTRODUODENOSCOPY (EGD) WITH PROPOFOL;  Surgeon: Toledo, Benay Pike, MD;  Location: [facility];  Service: Endoscopy;  Laterality: N/A;   KYPHOPLASTY N/A 08/10/2017   Procedure: Hewitt Shorts;  Surgeon: Hessie Knows, MD;  Location: [facility];  Service: Orthopedics;  Laterality: N/A;   KYPHOPLASTY N/A 09/09/2017   Procedure: [id number]-R9,F6;  Surgeon: Hessie Knows, MD;  Location: [facility];  Service: Orthopedics;  Laterality: N/A;   KYPHOPLASTY N/A 09/28/2017   Procedure: [id number];  Surgeon: Hessie Knows, MD;  Location: [facility];  Service: Orthopedics;  Laterality: N/A;   KYPHOPLASTY N/A 06/19/2019   Procedure: KYPHOPLASTY T5;  Surgeon: Hessie Knows, MD;  Location: [facility];  Service: Orthopedics;  Laterality: N/A;   OOPHORECTOMY Bilateral 2003   RIGHT/LEFT HEART CATH AND CORONARY ANGIOGRAPHY Bilateral 10/19/2019   Procedure: RIGHT/LEFT HEART CATH AND CORONARY ANGIOGRAPHY;  Surgeon: Yolonda Kida, MD;  Location: [facility];  Service: Cardiovascular;  Laterality: Bilateral;    OB History     Gravida  1   Para      Term      Preterm      AB  1   Living         SAB      IAB      Ectopic      Multiple      Live Births               Home Medications    Prior to Admission medications   Medication Sig Start Date End Date Taking? Authorizing Provider  cephALEXin (KEFLEX) 500 MG capsule Take 1 capsule (500 mg total) by mouth 4 (four) times daily for 7 days. 08/08/21 08/15/21 Yes Danton Clap, PA-C  albuterol (VENTOLIN HFA) 108 (90 Base) MCG/ACT inhaler Inhale 2 puffs into the lungs every 6 (six) hours as needed for wheezing or shortness of breath.     [provider]  amoxicillin-clavulanate (AUGMENTIN) 875-125 MG tablet Take 1 tablet by mouth 2 (two) times daily. 05/28/21   [provider]  benzonatate (TESSALON) 100 MG  capsule Take 2 capsules (200 mg total) by mouth every 8 (eight) hours. 03/11/21   Margarette Canada, NP  Bartolo Darter COVID-19 AG HOME TEST KIT Use as Directed on the Package 07/03/21   [provider]  budesonide-formoterol (SYMBICORT) 160-4.5 MCG/ACT inhaler Inhale 2 puffs into the lungs 2 (two) times daily.    [provider]  Cholecalciferol (VITAMIN D3) 2000 UNITS capsule Take 2,000 Units by mouth daily.     [provider]  cyanocobalamin (,VITAMIN B-12,) 1000 MCG/ML injection INJECT 1ML INTRAMUSCULARLY EVERY 30 DAYS 04/02/21   Earlie Server, MD  cyclobenzaprine (FLEXERIL) 5 MG tablet Take 5 mg by mouth 2 (two) times daily.    [provider]  Fe Fum-FePoly-Vit C-Vit B3 (INTEGRA) 62.5-62.5-40-3 MG CAPS Take 1 capsule by mouth daily. 11/29/13   [provider]  guaiFENesin-codeine (ROBITUSSIN AC) 100-10 MG/5ML syrup Take 5  mLs by mouth 3 (three) times daily as needed for cough. 03/11/21   Margarette Canada, NP  hydrocortisone 2.5 % ointment Apply topically. 06/23/21   [provider]  levofloxacin (LEVAQUIN) 500 MG tablet Take 1 tablet (500 mg total) by mouth daily. Hold this medication while taking doxycycline 04/01/21   Wyvonnia Dusky, MD  ondansetron [facility]) 4 MG tablet  05/28/21   [provider]  predniSONE (DELTASONE) 5 MG tablet Take 5 mg by mouth daily.     [provider]  Saline (SIMPLY SALINE) 0.9 % AERS Place 1 spray into the nose as needed (congestion).     [provider]  tacrolimus (PROTOPIC) 0.1 % ointment Apply topically. 06/23/21   [provider]    Family History Family History  Problem Relation Age of Onset   Kidney disease Mother    Diabetes Mellitus II Brother     Social History Social History   Tobacco Use   Smoking status: Former    Packs/day: 1.00    Types: Cigarettes    Quit date: 2009    Years since quitting: 14.4   Smokeless tobacco: Never  Vaping Use   Vaping Use: Never used   Substance Use Topics   Alcohol use: No   Drug use: No     Allergies   Methotrexate derivatives, Other, Ranitidine, Sulfasalazine, and Topiramate   Review of Systems Review of Systems  Musculoskeletal:  Positive for arthralgias and joint swelling.  Skin:  Positive for color change. Negative for wound.  Neurological:  Negative for weakness and numbness.  Hematological:  Bruises/bleeds easily.    Physical Exam Triage Vital Signs ED Triage Vitals  Enc Vitals Group     BP      Pulse      Resp      Temp      Temp src      SpO2      Weight      Height      Head Circumference      Peak Flow      Pain Score      Pain Loc      Pain Edu?      Excl. in [place]?    No data found.  Updated Vital Signs BP (!) 153/89 (BP Location: Right Arm)   Pulse 100   Temp 98.6 F (37 C) (Oral)   Resp (!) 23   Ht $R'5\' 2"'rW$  (1.575 m)   Wt 141 lb 1.5 oz (64 kg)   SpO2 100%   BMI 25.81 kg/m   Physical Exam Vitals and nursing note reviewed.  Constitutional:      General: She is not in acute distress.    Appearance: Normal appearance. She is not ill-appearing or toxic-appearing.  HENT:     Head: Normocephalic and atraumatic.  Eyes:     General: No scleral icterus.       Right eye: No discharge.        Left eye: No discharge.     Conjunctiva/sclera: Conjunctivae normal.  Cardiovascular:     Rate and Rhythm: Normal rate and regular rhythm.  Pulmonary:     Effort: Pulmonary effort is normal. No respiratory distress.  Musculoskeletal:     Left elbow: Swelling (mild swelling) present. Normal range of motion. Tenderness present in medial epicondyle and olecranon process.     Cervical back: Neck supple.     Comments: Moderate sized hematoma of elbow. Also an area of erythema with central  opening and small amount of pustular drainage   Skin:    General: Skin is dry.  Neurological:     General: No focal deficit present.     Mental Status: She is alert. Mental status is at baseline.      Motor: No weakness.     Gait: Gait normal.  Psychiatric:        Mood and Affect: Mood normal.        Behavior: Behavior normal.        Thought Content: Thought content normal.     UC Treatments / Results  Labs (all labs ordered are listed, but only abnormal results are displayed) Labs Reviewed - No data to display  EKG   Radiology DG Elbow Complete Left  Result Date: 08/08/2021 CLINICAL DATA:  Trauma yesterday, left elbow pain EXAM: LEFT ELBOW - COMPLETE 3+ VIEW COMPARISON:  None Available. FINDINGS: Frontal, bilateral oblique, lateral views of the left elbow are obtained. No acute displaced fracture, subluxation, or dislocation. Joint spaces are well preserved. No joint effusion. Soft tissues are unremarkable. IMPRESSION: 1. Unremarkable left elbow. Electronically Signed   By: Randa Ngo M.D.   On: 08/08/2021 17:27    Procedures Procedures (including critical care time)  Medications Ordered in UC Medications - No data to display  Initial Impression / Assessment and Plan / UC Course  I have reviewed the triage vital signs and the nursing notes.  Pertinent labs & imaging results that were available during my care of the patient were reviewed by me and considered in my medical decision making (see chart for details).  76 year old female presenting for left elbow pain, swelling and bruising.  Patient initially had concerns about infection in this area and now she is concerned because she has a hematoma as well.  X-rays performed today do not show any evidence of fracture.  Patient does have small area of cellulitis and scant pustular drainage.  About that she has a moderate sized hematoma.  Full range of motion of the elbow.  Reviewed RICE guidelines.  Patient given Ace wrap.  Treating cellulitis with Keflex.  Reviewed following up with Ortho or PCP if no improvement in the hematoma after ice and heat in the next week.   Final Clinical Impressions(s) / UC Diagnoses   Final  diagnoses:  Traumatic hematoma of left elbow, initial encounter  Cellulitis of left elbow     Discharge Instructions      -No fracture seen -You have a hematoma which is a large bruise. -You should ice this area every couple of hours for the next 3 days or so. Ice for about 15 minutes at a time -After 3 days use warm compresses -I have sent antibiotics the pharmacy for minor infection -If no improvement in the next week or worsening of symptoms follow up with [facility] or PCP to see if you need to have procedure done to drain the hematoma.     ED Prescriptions     Medication Sig Dispense Auth. Provider   cephALEXin (KEFLEX) 500 MG capsule Take 1 capsule (500 mg total) by mouth 4 (four) times daily for 7 days. 28 capsule Danton Clap, PA-C      PDMP not reviewed this encounter.   Danton Clap, PA-C 08/08/21 1836

## 2021-08-08 NOTE — Discharge Instructions (Addendum)
-  No fracture seen -You have a hematoma which is a large bruise. -You should ice this area every couple of hours for the next 3 days or so. Ice for about 15 minutes at a time -After 3 days use warm compresses -I have sent antibiotics the pharmacy for minor infection -If no improvement in the next week or worsening of symptoms follow up with [facility] or PCP to see if you need to have procedure done to drain the hematoma.

## 2021-10-21 ENCOUNTER — Inpatient Hospital Stay: Payer: Medicare Other | Attending: Nurse Practitioner

## 2021-10-21 DIAGNOSIS — D509 Iron deficiency anemia, unspecified: Secondary | ICD-10-CM | POA: Diagnosis present

## 2021-10-21 DIAGNOSIS — E538 Deficiency of other specified B group vitamins: Secondary | ICD-10-CM | POA: Diagnosis present

## 2021-10-21 DIAGNOSIS — R5383 Other fatigue: Secondary | ICD-10-CM | POA: Insufficient documentation

## 2021-10-21 DIAGNOSIS — Z87891 Personal history of nicotine dependence: Secondary | ICD-10-CM | POA: Diagnosis not present

## 2021-10-21 LAB — RETIC PANEL
Immature Retic Fract: 27.1 % — ABNORMAL HIGH (ref 2.3–15.9)
RBC.: 3.37 MIL/uL — ABNORMAL LOW (ref 3.87–5.11)
Retic Count, Absolute: 54.3 10*3/uL (ref 19.0–186.0)
Retic Ct Pct: 1.6 % (ref 0.4–3.1)
Reticulocyte Hemoglobin: 25.1 pg — ABNORMAL LOW (ref >27.9)

## 2021-10-21 LAB — CBC WITH DIFFERENTIAL/PLATELET
Abs Immature Granulocytes: 0.26 10*3/uL — ABNORMAL HIGH (ref 0.00–0.07)
Basophils Absolute: 0.1 10*3/uL (ref 0.0–0.1)
Basophils Relative: 1 %
Eosinophils Absolute: 0.1 10*3/uL (ref 0.0–0.5)
Eosinophils Relative: 1 %
HCT: 28.2 % — ABNORMAL LOW (ref 36.0–46.0)
Hemoglobin: 8.5 g/dL — ABNORMAL LOW (ref 12.0–15.0)
Immature Granulocytes: 2 %
Lymphocytes Relative: 9 %
Lymphs Abs: 1.1 10*3/uL (ref 0.7–4.0)
MCH: 24.8 pg — ABNORMAL LOW (ref 26.0–34.0)
MCHC: 30.1 g/dL (ref 30.0–36.0)
MCV: 82.2 fL (ref 80.0–100.0)
Monocytes Absolute: 0.8 10*3/uL (ref 0.1–1.0)
Monocytes Relative: 7 %
Neutro Abs: 10.1 10*3/uL — ABNORMAL HIGH (ref 1.7–7.7)
Neutrophils Relative %: 80 %
Platelets: 477 10*3/uL — ABNORMAL HIGH (ref 150–400)
RBC: 3.43 MIL/uL — ABNORMAL LOW (ref 3.87–5.11)
RDW: 18.4 % — ABNORMAL HIGH (ref 11.5–15.5)
WBC: 12.4 10*3/uL — ABNORMAL HIGH (ref 4.0–10.5)
nRBC: 0 % (ref 0.0–0.2)

## 2021-10-21 LAB — COMPREHENSIVE METABOLIC PANEL
ALT: 12 U/L (ref 0–44)
AST: 21 U/L (ref 15–41)
Albumin: 3.3 g/dL — ABNORMAL LOW (ref 3.5–5.0)
Alkaline Phosphatase: 74 U/L (ref 38–126)
Anion gap: 8 (ref 5–15)
BUN: 9 mg/dL (ref 8–23)
CO2: 28 mmol/L (ref 22–32)
Calcium: 8.5 mg/dL — ABNORMAL LOW (ref 8.9–10.3)
Chloride: 98 mmol/L (ref 98–111)
Creatinine, Ser: 0.83 mg/dL (ref 0.44–1.00)
GFR, Estimated: >60 mL/min (ref >60)
Glucose, Bld: 121 mg/dL — ABNORMAL HIGH (ref 70–99)
Potassium: 3.7 mmol/L (ref 3.5–5.1)
Sodium: 134 mmol/L — ABNORMAL LOW (ref 135–145)
Total Bilirubin: 0.2 mg/dL — ABNORMAL LOW (ref 0.3–1.2)
Total Protein: 6.5 g/dL (ref 6.5–8.1)

## 2021-10-21 LAB — IRON AND TIBC
Iron: 174 ug/dL — ABNORMAL HIGH (ref 28–170)
Saturation Ratios: 51 % — ABNORMAL HIGH (ref 10.4–31.8)
TIBC: 340 ug/dL (ref 250–450)
UIBC: 166 ug/dL

## 2021-10-21 LAB — VITAMIN B12: Vitamin B-12: 639 pg/mL (ref 180–914)

## 2021-10-21 LAB — TSH: TSH: 3.202 u[IU]/mL (ref 0.350–4.500)

## 2021-10-21 LAB — T4, FREE: Free T4: 1 ng/dL (ref 0.61–1.12)

## 2021-10-21 LAB — FOLATE: Folate: 16.2 ng/mL (ref >5.9)

## 2021-10-23 ENCOUNTER — Other Ambulatory Visit: Payer: Self-pay | Admitting: *Deleted

## 2021-10-23 ENCOUNTER — Telehealth: Payer: Self-pay | Admitting: *Deleted

## 2021-10-23 ENCOUNTER — Inpatient Hospital Stay (HOSPITAL_BASED_OUTPATIENT_CLINIC_OR_DEPARTMENT_OTHER): Payer: Medicare Other | Admitting: Nurse Practitioner

## 2021-10-23 DIAGNOSIS — D509 Iron deficiency anemia, unspecified: Secondary | ICD-10-CM

## 2021-10-23 LAB — FERRITIN: Ferritin: 11 ng/mL (ref 11–307)

## 2021-10-23 NOTE — Progress Notes (Signed)
Returns for follow-up. Pt reports fatigue for about 1 week. Reports feeling out of breath with ambulation, even with home O2.

## 2021-10-23 NOTE — Telephone Encounter (Signed)
Called patient to inform her of her ferritin level of 11. Lauren NP, wants her to have 4 venofer treatments over the next 4 weeks. Pt has requested to come on Thursdays at 1 pm. I called Amber as well to tell her the days the patient has chosen to come.

## 2021-10-23 NOTE — Progress Notes (Signed)
Hematology Oncology Progress Note [facility] Day:  10/23/2021  Referring physician: Lauro Regulus, MD  Chief Complaint: Ashley Mack is a 76 y.o. female with iron deficiency anemia and B12 deficiency  presents to follow-up.    INTERVAL HISTORY Ashley Mack is a 76 y.o. female who has above history reviewed by me today presents for follow up visit for management of anemia and B12 deficiency Problems and complaints are listed below: Patient previously followed up by Dr.Corcoran, patient switched care to me on 08/15/20 Extensive medical record review was performed by me  Chronic recurrent iron deficiency anemia   She had a colonoscopy on 03/18/2009. She has had several EGDs with the last on 10/12/2008.  She states that she has had polyps, reflux and a hiatal hernia.EGD on 12/09/2016 was normal, other than demonstrating a small hiatal hernia. Colonoscopy on 12/09/2016 demonstrated friability with contact bleeding in the proximal transverse colon. A 1 mm sessile polyp was found in the sigmoid colon, in addition to grade II non-bleeding internal hemorrhoids were also noted. Pathology results demonstrated a tubular adenoma that was negative for dyplasia or malignancy. 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   Patient has had a multiple IV Venofer treatments. She received Venofer on 09/08/2016, 09/15/2016, 09/25/2016, 11/26/2016, 12/16/2016, 12/24/2016, x3 (05/27/2017 - 06/10/2017), x2 (03/03/2018 - 03/10/2018), x3 (06/22/2018 - 07/06/2018), x 2 (08/31/2018 - 09/07/2018), x 4 (03/02/2019 - 03/23/2019), and x 3 (07/05/2019 - 07/20/2019).  She receives Venofer if her ferritin is < 30.   Vitamin B12 deficiency, She began weekly B12 on 09/08/2016 and continued x 6 weeks (last 10/16/2016). She began monthly B12 on 11/26/2016 (last 02/17/2018).  Patient used to get vitamin B12 prescription from Dr. Merlene Pulling and her chiropractor injects for her.  She has  rheumatoid arthritis.  She has been treated with methotrexate, Imuran, leflunomide, and sulfasalazine.  She is on prednisone 5 mg a day x 2 years.  She is s/p kyphoplasty x 5 since 04/2017.    INTERVAL HISTORY Ashley Mack is a 76 y.o. female with above history of iron deficiency anemia and B12 deficiency who returns to clinic for discussion of lab tests and consideration of IV iron.  She feels extremely fatigued today. Denies any neurologic complaints. Denies recent fevers or illnesses. Denies any easy bleeding or bruising. No melena or hematochezia. No pica or restless leg. Reports good appetite and denies weight loss. Denies chest pain. Denies any nausea, vomiting, constipation, or diarrhea. Denies urinary complaints. Patient offers no further specific complaints today.  Review of Systems  Constitutional:  Positive for fatigue. Negative for appetite change and unexpected weight change.  HENT:   Negative for mouth sores.   Respiratory:  Negative for shortness of breath.   Cardiovascular:  Negative for chest pain.  Gastrointestinal:  Negative for abdominal pain and blood in stool.  Genitourinary:  Negative for dysuria.   Musculoskeletal:  Positive for arthralgias and back pain.  Neurological:  Negative for headaches.  Psychiatric/Behavioral:  Negative for confusion and depression.      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)    Iron (Fe) deficiency anemia    Migraine headache    in past   Migraines    Motion sickness    No natural teeth    Osteoporosis    Rheumatoid arthritis ([place])    Vertigo     Past Surgical History:  Procedure Laterality Date    CATARACT EXTRACTION W/PHACO Right 08/12/2016   Procedure: CATARACT EXTRACTION PHACO AND INTRAOCULAR LENS PLACEMENT ([place])  Right;  Surgeon: Leandrew Koyanagi, MD;  Location: [place];  Service: Ophthalmology;  Laterality: Right;   CATARACT EXTRACTION W/PHACO Left 09/30/2016   Procedure: CATARACT EXTRACTION PHACO AND INTRAOCULAR LENS PLACEMENT ([place]) Left;  Surgeon: Leandrew Koyanagi, MD;  Location: [place];  Service: Ophthalmology;  Laterality: Left;   COLONOSCOPY     COLONOSCOPY WITH PROPOFOL N/A 12/09/2016   Procedure: COLONOSCOPY WITH PROPOFOL;  Surgeon: Toledo, Benay Pike, MD;  Location: [facility];  Service: Endoscopy;  Laterality: N/A;   ESOPHAGOGASTRODUODENOSCOPY     ESOPHAGOGASTRODUODENOSCOPY (EGD) WITH PROPOFOL N/A 12/09/2016   Procedure: ESOPHAGOGASTRODUODENOSCOPY (EGD) WITH PROPOFOL;  Surgeon: Toledo, Benay Pike, MD;  Location: [facility];  Service: Endoscopy;  Laterality: N/A;   KYPHOPLASTY N/A 08/10/2017   Procedure: Hewitt Shorts;  Surgeon: Hessie Knows, MD;  Location: [facility];  Service: Orthopedics;  Laterality: N/A;   KYPHOPLASTY N/A 09/09/2017   Procedure: [id number]-J0,R1;  Surgeon: Hessie Knows, MD;  Location: [facility];  Service: Orthopedics;  Laterality: N/A;   KYPHOPLASTY N/A 09/28/2017   Procedure: [id number];  Surgeon: Hessie Knows, MD;  Location: [facility];  Service: Orthopedics;  Laterality: N/A;   KYPHOPLASTY N/A 06/19/2019   Procedure: KYPHOPLASTY T5;  Surgeon: Hessie Knows, MD;  Location: [facility];  Service: Orthopedics;  Laterality: N/A;   OOPHORECTOMY Bilateral 2003   RIGHT/LEFT HEART CATH AND CORONARY ANGIOGRAPHY Bilateral 10/19/2019   Procedure: RIGHT/LEFT HEART CATH AND CORONARY ANGIOGRAPHY;  Surgeon: Yolonda Kida, MD;  Location: [facility];  Service: Cardiovascular;  Laterality: Bilateral;    Family History  Problem Relation Age of Onset   Kidney disease Mother    Diabetes Mellitus II Brother     Social  History:  reports that she quit smoking about 14 years ago. Her smoking use included cigarettes. She smoked an average of 1 pack per day. She has never used smokeless tobacco. She reports that she does not drink alcohol and does not use drugs. She quit smoking in 2009.  She lives in [place]. Her step daughter is Museum/gallery conservator.   Allergies:  Allergies  Allergen Reactions   Methotrexate Derivatives Nausea And Vomiting and Other (See Comments)    Flu like symptoms   Other Nausea Only and Other (See Comments)    Arthritis medications . Unsure of what the med was.   Ranitidine Hives   Sulfasalazine Other (See Comments)    Stomach upset   Topiramate Nausea Only    Current Medications: Current Outpatient Medications  Medication Sig Dispense Refill   albuterol (VENTOLIN HFA) 108 (90 Base) MCG/ACT inhaler Inhale 2 puffs into the lungs every 6 (six) hours as needed for wheezing or shortness of breath.      budesonide-formoterol (SYMBICORT) 160-4.5 MCG/ACT inhaler Inhale 2 puffs into the lungs 2 (two) times daily.     Cholecalciferol (VITAMIN D3) 2000 UNITS capsule Take 2,000 Units by mouth daily.      cyanocobalamin (,VITAMIN B-12,) 1000 MCG/ML injection INJECT 1ML INTRAMUSCULARLY EVERY 30 DAYS 10 mL 1   cyclobenzaprine (FLEXERIL)  5 MG tablet Take 5 mg by mouth 2 (two) times daily.     diclofenac Sodium (VOLTAREN) 1 % GEL Apply 2 g topically as needed.     Fe Fum-FePoly-Vit C-Vit B3 (INTEGRA) 62.5-62.5-40-3 MG CAPS Take 1 capsule by mouth daily.     predniSONE (DELTASONE) 5 MG tablet Take 5 mg by mouth daily.      No current facility-administered medications for this visit.      Performance status (ECOG): 1  Vitals Blood pressure 137/66, pulse (!) 109, temperature 98.4 F (36.9 C), temperature source Oral, resp. rate 16, weight 129 lb (58.5 kg), SpO2 97 %.   Physical Exam Constitutional:      Appearance: She is not ill-appearing.     Comments: wheelchair  Pulmonary:     Effort: No  respiratory distress.  Skin:    Coloration: Skin is pale.     Findings: Bruising present.  Neurological:     Mental Status: She is alert and oriented to person, place, and time.  Psychiatric:        Mood and Affect: Mood normal.        Behavior: Behavior normal.       Latest Ref Rng & Units 10/21/2021    7:51 AM 04/01/2021    5:52 AM 03/31/2021    4:25 AM  CBC  WBC 4.0 - 10.5 K/uL 12.4  9.9  11.1   Hemoglobin 12.0 - 15.0 g/dL 8.5  11.3  11.0   Hematocrit 36.0 - 46.0 % 28.2  35.0  33.9   Platelets 150 - 400 K/uL 477  500  495       Latest Ref Rng & Units 10/21/2021    7:51 AM 04/01/2021    5:52 AM 03/31/2021    4:25 AM  CMP  Glucose 70 - 99 mg/dL 121  154  89   BUN 8 - 23 mg/dL $Remove'9  20  16   'ZgkEOht$ Creatinine 0.44 - 1.00 mg/dL 0.83  1.20  0.92   Sodium 135 - 145 mmol/L 134  132  134   Potassium 3.5 - 5.1 mmol/L 3.7  3.4  3.6   Chloride 98 - 111 mmol/L 98  88  89   CO2 22 - 32 mmol/L 28  34  35   Calcium 8.9 - 10.3 mg/dL 8.5  8.8  8.8   Total Protein 6.5 - 8.1 g/dL 6.5     Total Bilirubin 0.3 - 1.2 mg/dL 0.2     Alkaline Phos 38 - 126 U/L 74     AST 15 - 41 U/L 21     ALT 0 - 44 U/L 12      Iron/TIBC/Ferritin/ %Sat    Component Value Date/Time   IRON 174 (H) 10/21/2021 0751   TIBC 340 10/21/2021 0751   FERRITIN 11 10/21/2021 0751   IRONPCTSAT 51 (H) 10/21/2021 0751     Assessment and plan:   No diagnosis found.  #Recurrent chronic iron deficiency anemia- hemoglobin has dropped significantly to 8.5. Baseline around 11-12. Reticulocyte hemoglobin low, consistent with iron deficiency. Ferritin pending at time of appointment but low. Iron saturation elevated at 51%, likely secondary to elevated serum iron.  We reviewed that this is not typical for iron deficiency.  Reviewed with Dr. Tasia Catchings who recommends IV iron/Venofer  4. Continue oral iron supplementation. Consider bone marrow biopsy if no improvement with iv iron.  Patient is reluctant to consider bone marrow.  Hold at this  point.  #Vitamin B12 deficiency-  on b12 monthly with chiropractic. Will check at next visit.   Disposition: Venofer x 4 (every 3-7 days) 12 weeks- cbc ferritin iron studies b12 Day or 2 later- see Dr Tasia Catchings, +/- venofer & b12- la  I discussed the assessment and treatment plan with the patient.  The patient was provided an opportunity to ask questions and all were answered.  The patient agreed with the plan and demonstrated an understanding of the instructions.  The patient was advised to call back if the symptoms worsen or if the condition fails to improve as anticipated.  Beckey Rutter, DNP, AGNP-C [place] [facility] [phone] (clinic) 10/23/2021

## 2021-10-29 MED FILL — Iron Sucrose Inj 20 MG/ML (Fe Equiv): INTRAVENOUS | Qty: 10 | Status: AC

## 2021-10-30 ENCOUNTER — Inpatient Hospital Stay: Payer: Medicare Other

## 2021-10-30 VITALS — BP 143/83 | HR 106 | Temp 98.0°F | Resp 18

## 2021-10-30 DIAGNOSIS — D509 Iron deficiency anemia, unspecified: Secondary | ICD-10-CM

## 2021-10-30 DIAGNOSIS — E538 Deficiency of other specified B group vitamins: Secondary | ICD-10-CM

## 2021-10-30 MED ORDER — SODIUM CHLORIDE 0.9 % IV SOLN
Freq: Once | INTRAVENOUS | Status: AC
  Filled 2021-10-30: qty 250

## 2021-10-30 MED ORDER — SODIUM CHLORIDE 0.9 % IV SOLN
200.0000 mg | Freq: Once | INTRAVENOUS | Status: AC
  Administered 2021-10-30: 200 mg via INTRAVENOUS
  Filled 2021-10-30: qty 10

## 2021-11-05 MED FILL — Iron Sucrose Inj 20 MG/ML (Fe Equiv): INTRAVENOUS | Qty: 10 | Status: AC

## 2021-11-06 ENCOUNTER — Inpatient Hospital Stay: Payer: Medicare Other

## 2021-11-06 VITALS — BP 150/78 | HR 106 | Temp 99.2°F | Resp 18

## 2021-11-06 DIAGNOSIS — D509 Iron deficiency anemia, unspecified: Secondary | ICD-10-CM

## 2021-11-06 DIAGNOSIS — E538 Deficiency of other specified B group vitamins: Secondary | ICD-10-CM

## 2021-11-06 MED ORDER — SODIUM CHLORIDE 0.9 % IV SOLN
INTRAVENOUS | Status: DC | PRN
  Filled 2021-11-06: qty 250

## 2021-11-06 MED ORDER — SODIUM CHLORIDE 0.9 % IV SOLN
200.0000 mg | Freq: Once | INTRAVENOUS | Status: AC
  Administered 2021-11-06: 200 mg via INTRAVENOUS
  Filled 2021-11-06: qty 200

## 2021-11-12 MED FILL — Iron Sucrose Inj 20 MG/ML (Fe Equiv): INTRAVENOUS | Qty: 10 | Status: AC

## 2021-11-13 ENCOUNTER — Inpatient Hospital Stay: Payer: Medicare Other

## 2021-11-13 VITALS — BP 148/57 | HR 109 | Temp 99.2°F | Resp 17

## 2021-11-13 DIAGNOSIS — E538 Deficiency of other specified B group vitamins: Secondary | ICD-10-CM

## 2021-11-13 DIAGNOSIS — D509 Iron deficiency anemia, unspecified: Secondary | ICD-10-CM | POA: Diagnosis not present

## 2021-11-13 MED ORDER — SODIUM CHLORIDE 0.9 % IV SOLN
200.0000 mg | Freq: Once | INTRAVENOUS | Status: AC
  Administered 2021-11-13: 200 mg via INTRAVENOUS
  Filled 2021-11-13: qty 200

## 2021-11-13 NOTE — Patient Instructions (Signed)

## 2021-11-19 MED FILL — Iron Sucrose Inj 20 MG/ML (Fe Equiv): INTRAVENOUS | Qty: 10 | Status: AC

## 2021-11-20 ENCOUNTER — Inpatient Hospital Stay: Payer: Medicare Other | Attending: Oncology

## 2021-11-20 VITALS — BP 157/71 | HR 100 | Temp 96.8°F | Resp 20

## 2021-11-20 DIAGNOSIS — D509 Iron deficiency anemia, unspecified: Secondary | ICD-10-CM | POA: Insufficient documentation

## 2021-11-20 DIAGNOSIS — E538 Deficiency of other specified B group vitamins: Secondary | ICD-10-CM | POA: Diagnosis present

## 2021-11-20 MED ORDER — SODIUM CHLORIDE 0.9 % IV SOLN
200.0000 mg | Freq: Once | INTRAVENOUS | Status: AC
  Administered 2021-11-20: 200 mg via INTRAVENOUS
  Filled 2021-11-20: qty 200

## 2021-11-20 MED ORDER — SODIUM CHLORIDE 0.9 % IV SOLN
INTRAVENOUS | Status: DC
  Filled 2021-11-20: qty 250

## 2021-11-20 NOTE — Patient Instructions (Signed)

## 2022-01-14 ENCOUNTER — Other Ambulatory Visit: Payer: Self-pay

## 2022-01-14 DIAGNOSIS — D509 Iron deficiency anemia, unspecified: Secondary | ICD-10-CM

## 2022-01-15 ENCOUNTER — Inpatient Hospital Stay: Payer: Medicare Other | Attending: Oncology

## 2022-01-15 DIAGNOSIS — D509 Iron deficiency anemia, unspecified: Secondary | ICD-10-CM | POA: Diagnosis not present

## 2022-01-15 DIAGNOSIS — R5383 Other fatigue: Secondary | ICD-10-CM | POA: Diagnosis not present

## 2022-01-15 DIAGNOSIS — M069 Rheumatoid arthritis, unspecified: Secondary | ICD-10-CM | POA: Insufficient documentation

## 2022-01-15 DIAGNOSIS — E538 Deficiency of other specified B group vitamins: Secondary | ICD-10-CM | POA: Diagnosis present

## 2022-01-15 LAB — IRON AND TIBC
Iron: 32 ug/dL (ref 28–170)
Saturation Ratios: 10 % — ABNORMAL LOW (ref 10.4–31.8)
TIBC: 333 ug/dL (ref 250–450)
UIBC: 301 ug/dL

## 2022-01-15 LAB — CBC WITH DIFFERENTIAL/PLATELET
Abs Immature Granulocytes: 0.11 10*3/uL — ABNORMAL HIGH (ref 0.00–0.07)
Basophils Absolute: 0.1 10*3/uL (ref 0.0–0.1)
Basophils Relative: 0 %
Eosinophils Absolute: 0 10*3/uL (ref 0.0–0.5)
Eosinophils Relative: 0 %
HCT: 28.8 % — ABNORMAL LOW (ref 36.0–46.0)
Hemoglobin: 8.8 g/dL — ABNORMAL LOW (ref 12.0–15.0)
Immature Granulocytes: 1 %
Lymphocytes Relative: 6 %
Lymphs Abs: 0.9 10*3/uL (ref 0.7–4.0)
MCH: 27.1 pg (ref 26.0–34.0)
MCHC: 30.6 g/dL (ref 30.0–36.0)
MCV: 88.6 fL (ref 80.0–100.0)
Monocytes Absolute: 0.4 10*3/uL (ref 0.1–1.0)
Monocytes Relative: 3 %
Neutro Abs: 13.4 10*3/uL — ABNORMAL HIGH (ref 1.7–7.7)
Neutrophils Relative %: 90 %
Platelets: 435 10*3/uL — ABNORMAL HIGH (ref 150–400)
RBC: 3.25 MIL/uL — ABNORMAL LOW (ref 3.87–5.11)
RDW: 16.8 % — ABNORMAL HIGH (ref 11.5–15.5)
WBC: 14.8 10*3/uL — ABNORMAL HIGH (ref 4.0–10.5)
nRBC: 0 % (ref 0.0–0.2)

## 2022-01-15 LAB — VITAMIN B12: Vitamin B-12: 567 pg/mL (ref 180–914)

## 2022-01-15 LAB — FERRITIN: Ferritin: 26 ng/mL (ref 11–307)

## 2022-01-22 ENCOUNTER — Inpatient Hospital Stay (HOSPITAL_BASED_OUTPATIENT_CLINIC_OR_DEPARTMENT_OTHER): Payer: Medicare Other | Admitting: Oncology

## 2022-01-22 ENCOUNTER — Ambulatory Visit: Payer: Medicare Other | Admitting: Oncology

## 2022-01-22 ENCOUNTER — Encounter: Payer: Self-pay | Admitting: Oncology

## 2022-01-22 ENCOUNTER — Ambulatory Visit: Payer: Medicare Other

## 2022-01-22 ENCOUNTER — Inpatient Hospital Stay: Payer: Medicare Other

## 2022-01-22 VITALS — BP 159/68 | HR 105 | Resp 20

## 2022-01-22 VITALS — BP 152/71 | HR 114 | Temp 97.8°F | Wt 132.1 lb

## 2022-01-22 DIAGNOSIS — D509 Iron deficiency anemia, unspecified: Secondary | ICD-10-CM | POA: Diagnosis not present

## 2022-01-22 DIAGNOSIS — E538 Deficiency of other specified B group vitamins: Secondary | ICD-10-CM

## 2022-01-22 MED ORDER — SODIUM CHLORIDE 0.9 % IV SOLN
INTRAVENOUS | Status: DC
  Filled 2022-01-22: qty 250

## 2022-01-22 MED ORDER — SODIUM CHLORIDE 0.9 % IV SOLN
200.0000 mg | Freq: Once | INTRAVENOUS | Status: AC
  Administered 2022-01-22: 200 mg via INTRAVENOUS
  Filled 2022-01-22: qty 200

## 2022-01-22 NOTE — Assessment & Plan Note (Signed)
#  Recurrent chronic iron deficiency anemia Labs reviewed and discussed with patient.  iron saturation 10,  ferritin 26.  Hemoglobin 8.8 Either she has persistent chronic blood loss or underlying bone marrow disorders.  Patient is not interested in bone marrow biopsy.  Recommend IV venofer weekly x3, repeat cbc in 6 weeks for evaluation of bone marrow response.

## 2022-01-22 NOTE — Progress Notes (Signed)
Hematology oncology progress note   ASSESSMENT & PLAN:   Iron deficiency anemia #Recurrent chronic iron deficiency anemia Labs reviewed and discussed with patient.  iron saturation 10,  ferritin 26.  Hemoglobin 8.8 Either she has persistent chronic blood loss or underlying bone marrow disorders.  Patient is not interested in bone marrow biopsy.  Recommend IV venofer weekly x3, repeat cbc in 6 weeks for evaluation of bone marrow response.   B12 deficiency Vitamin B12 deficiency, patient is on vitamin B12  injection monthly.  Patient prefers to have her chiropractor admitting B12 for her.   Orders Placed This Encounter  Procedures   CBC with Differential/Platelet    Standing Status:   Future    Standing Expiration Date:   01/22/2023   Iron and TIBC([facility])    Standing Status:   Future    Standing Expiration Date:   01/23/2023   Ferritin    Standing Status:   Future    Standing Expiration Date:   01/23/2023   Retic Panel    Standing Status:   Future    Standing Expiration Date:   01/23/2023   CBC with Differential/Platelet    M+3    Standing Status:   Future    Standing Expiration Date:   01/22/2023   Iron and TIBC([facility])    M+3    Standing Status:   Future    Standing Expiration Date:   01/23/2023   Ferritin    M+3    Standing Status:   Future    Standing Expiration Date:   01/23/2023   Retic Panel    M+3    Standing Status:   Future    Standing Expiration Date:   01/23/2023   Repeat blood work in 3 weeks.  Follow up in 3 months.   All questions were answered. The patient knows to call the clinic with any problems, questions or concerns.  Earlie Server, MD, PhD [facility] Hematology Oncology 01/22/2022    Chief Complaint: Ashley BARNER is a 76 y.o. female with iron deficiency anemia and B12 deficiency  presents to follow-up.    INTERVAL HISTORY Ashley Mack is a 76 y.o. female who has above history reviewed by me today presents for follow up visit  for management of anemia and B12 deficiency Problems and complaints are listed below: Patient previously followed up by Dr.Corcoran, patient switched care to me on 08/15/20 Extensive medical record review was performed by me  Chronic recurrent iron deficiency anemia   She had a colonoscopy on 03/18/2009. She has had several EGDs with the last on 10/12/2008.  She states that she has had polyps, reflux and a hiatal hernia.EGD on 12/09/2016 was normal, other than demonstrating a small hiatal hernia. Colonoscopy on 12/09/2016 demonstrated friability with contact bleeding in the proximal transverse colon. A 1 mm sessile polyp was found in the sigmoid colon, in addition to grade II non-bleeding internal hemorrhoids were also noted. Pathology results demonstrated a tubular adenoma that was negative for dyplasia or malignancy. 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   Patient has had a multiple IV Venofer treatments. receives Venofer if her ferritin is < 30.   Vitamin B12 deficiency, She began weekly B12 on 09/08/2016 and continued x 6 weeks (last 10/16/2016). She began monthly B12 on 11/26/2016 (last 02/17/2018).  Patient used to get vitamin B12 prescription from Dr. Mike Gip and her chiropractor injects for her.   She has rheumatoid arthritis.  She has been treated with methotrexate, Imuran, leflunomide, and  sulfasalazine.  She is on prednisone 5 mg a day x 2 years.  She is s/p kyphoplasty x 5 since 04/2017.    INTERVAL HISTORY Ashley Mack is a 76 y.o. female who has above history reviewed by me today presents for follow up visit for management of vitamin B12 deficiency and iron deficiency anemia.  S/p IV venofer x 4 in August/Sept 2023. She feels fatigue is slightly better.   Denies any bloody or black stool.    Review of Systems  Constitutional:  Positive for fatigue. Negative for appetite change and unexpected weight change.  HENT:   Negative for mouth sores.   Respiratory:  Negative for  shortness of breath.   Cardiovascular:  Negative for chest pain.  Gastrointestinal:  Negative for abdominal pain and blood in stool.  Genitourinary:  Negative for dysuria.   Musculoskeletal:  Positive for arthralgias and back pain.  Neurological:  Negative for headaches.  Psychiatric/Behavioral:  Negative for confusion.      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)    Iron (Fe) deficiency anemia    Migraine headache    in past   Migraines    Motion sickness    No natural teeth    Osteoporosis    Rheumatoid arthritis ([place])    Vertigo     Past Surgical History:  Procedure Laterality Date   CATARACT EXTRACTION W/PHACO Right 08/12/2016   Procedure: CATARACT EXTRACTION PHACO AND INTRAOCULAR LENS PLACEMENT ([place])  Right;  Surgeon: Leandrew Koyanagi, MD;  Location: [place];  Service: Ophthalmology;  Laterality: Right;   CATARACT EXTRACTION W/PHACO Left 09/30/2016   Procedure: CATARACT EXTRACTION PHACO AND INTRAOCULAR LENS PLACEMENT ([place]) Left;  Surgeon: Leandrew Koyanagi, MD;  Location: [place];  Service: Ophthalmology;  Laterality: Left;   COLONOSCOPY     COLONOSCOPY WITH PROPOFOL N/A 12/09/2016   Procedure: COLONOSCOPY WITH PROPOFOL;  Surgeon: Toledo, Benay Pike, MD;  Location: [facility];  Service: Endoscopy;  Laterality: N/A;   ESOPHAGOGASTRODUODENOSCOPY     ESOPHAGOGASTRODUODENOSCOPY (EGD) WITH PROPOFOL N/A 12/09/2016   Procedure: ESOPHAGOGASTRODUODENOSCOPY (EGD) WITH PROPOFOL;  Surgeon: Toledo, Benay Pike, MD;  Location: [facility];  Service: Endoscopy;  Laterality: N/A;   KYPHOPLASTY N/A 08/10/2017   Procedure:  Hewitt Shorts;  Surgeon: Hessie Knows, MD;  Location: [facility];  Service: Orthopedics;  Laterality: N/A;   KYPHOPLASTY N/A 09/09/2017   Procedure: [id number]-H0,Q6;  Surgeon: Hessie Knows, MD;  Location: [facility];  Service: Orthopedics;  Laterality: N/A;   KYPHOPLASTY N/A 09/28/2017   Procedure: [id number];  Surgeon: Hessie Knows, MD;  Location: [facility];  Service: Orthopedics;  Laterality: N/A;   KYPHOPLASTY N/A 06/19/2019   Procedure: KYPHOPLASTY T5;  Surgeon: Hessie Knows, MD;  Location: [facility];  Service: Orthopedics;  Laterality: N/A;   OOPHORECTOMY Bilateral 2003   RIGHT/LEFT HEART CATH AND CORONARY ANGIOGRAPHY Bilateral 10/19/2019   Procedure: RIGHT/LEFT HEART CATH AND CORONARY ANGIOGRAPHY;  Surgeon: Yolonda Kida, MD;  Location: [place] [facility];  Service: Cardiovascular;  Laterality: Bilateral;    Family History  Problem Relation Age of Onset  Kidney disease Mother    Diabetes Mellitus II Brother     Social History:  reports that she quit smoking about 14 years ago. Her smoking use included cigarettes. She smoked an average of 1 pack per day. She has never used smokeless tobacco. She reports that she does not drink alcohol and does not use drugs. She quit smoking in 2009.  She lives in [place]. Her step daughter is Museum/gallery conservator. The patient is accompanied by [employer] today.  Allergies:  Allergies  Allergen Reactions   Methotrexate Derivatives Nausea And Vomiting and Other (See Comments)    Flu like symptoms   Other Nausea Only and Other (See Comments)    Arthritis medications . Unsure of what the med was.   Ranitidine Hives   Sulfasalazine Other (See Comments)    Stomach upset   Topiramate Nausea Only    Current Medications: Current Outpatient Medications  Medication Sig Dispense Refill   albuterol (VENTOLIN HFA) 108 (90 Base) MCG/ACT inhaler Inhale 2 puffs into the lungs every 6 (six) hours as needed for wheezing or shortness of breath.       budesonide-formoterol (SYMBICORT) 160-4.5 MCG/ACT inhaler Inhale 2 puffs into the lungs 2 (two) times daily.     Cholecalciferol (VITAMIN D3) 2000 UNITS capsule Take 2,000 Units by mouth daily.      cyanocobalamin (,VITAMIN B-12,) 1000 MCG/ML injection INJECT 1ML INTRAMUSCULARLY EVERY 30 DAYS 10 mL 1   cyclobenzaprine (FLEXERIL) 5 MG tablet Take 5 mg by mouth 2 (two) times daily.     diclofenac Sodium (VOLTAREN) 1 % GEL Apply 2 g topically as needed.     Fe Fum-FePoly-Vit C-Vit B3 (INTEGRA) 62.5-62.5-40-3 MG CAPS Take 1 capsule by mouth daily.     predniSONE (DELTASONE) 5 MG tablet Take 5 mg by mouth daily.      No current facility-administered medications for this visit.      Performance status (ECOG): 1  Vitals Blood pressure (!) 152/71, pulse (!) 114, temperature 97.8 F (36.6 C), temperature source Tympanic, weight 132 lb 1.6 oz (59.9 kg), SpO2 97 %.   Physical Exam Vitals reviewed.  Constitutional:      General: She is not in acute distress.    Appearance: She is well-developed. She is not diaphoretic.     Comments: Patient sits in the wheelchair  HENT:     Mouth/Throat:     Pharynx: No oropharyngeal exudate.  Eyes:     General: No scleral icterus.    Pupils: Pupils are equal, round, and reactive to light.  Cardiovascular:     Rate and Rhythm: Normal rate.  Pulmonary:     Effort: Pulmonary effort is normal. No respiratory distress.  Abdominal:     General: There is no distension.     Palpations: Abdomen is soft.  Musculoskeletal:        General: Normal range of motion.     Cervical back: Normal range of motion and neck supple.  Lymphadenopathy:     Cervical: No cervical adenopathy.     Upper Body:     Right upper body: No supraclavicular adenopathy.     Left upper body: No supraclavicular adenopathy.  Skin:    General: Skin is warm and dry.     Findings: Bruising present.  Neurological:     Mental Status: She is alert. Mental status is at baseline.  Psychiatric:         Mood and Affect: Mood normal.

## 2022-01-22 NOTE — Assessment & Plan Note (Signed)
Vitamin B12 deficiency, patient is on vitamin B12  injection monthly.  Patient prefers to have her chiropractor admitting B12 for her. 

## 2022-01-22 NOTE — Progress Notes (Signed)
Educated pt on staying after Venofer for 30 min observation. Pt refused. Unable to stay because of her back pain and daughter waiting in the lobby. Stated "If I have to do that I will not be getting anymore iron and hasn't had to do it before". RN made pt aware of protocol.

## 2022-01-28 MED FILL — Iron Sucrose Inj 20 MG/ML (Fe Equiv): INTRAVENOUS | Qty: 10 | Status: AC

## 2022-01-29 ENCOUNTER — Inpatient Hospital Stay: Payer: Medicare Other

## 2022-01-29 VITALS — BP 133/69 | HR 99 | Temp 99.1°F | Resp 18

## 2022-01-29 DIAGNOSIS — E538 Deficiency of other specified B group vitamins: Secondary | ICD-10-CM

## 2022-01-29 DIAGNOSIS — D509 Iron deficiency anemia, unspecified: Secondary | ICD-10-CM | POA: Diagnosis not present

## 2022-01-29 MED ORDER — SODIUM CHLORIDE 0.9 % IV SOLN
INTRAVENOUS | Status: DC
  Filled 2022-01-29: qty 250

## 2022-01-29 MED ORDER — SODIUM CHLORIDE 0.9 % IV SOLN
200.0000 mg | Freq: Once | INTRAVENOUS | Status: AC
  Administered 2022-01-29: 200 mg via INTRAVENOUS
  Filled 2022-01-29: qty 200

## 2022-01-29 NOTE — Progress Notes (Signed)
Pt didn't want to wait 30 mins after her Venofer infusion. Pt. Educated and understood.

## 2022-02-04 ENCOUNTER — Inpatient Hospital Stay: Payer: Medicare Other

## 2022-02-04 VITALS — BP 123/40 | HR 98 | Temp 97.4°F | Resp 20

## 2022-02-04 DIAGNOSIS — E538 Deficiency of other specified B group vitamins: Secondary | ICD-10-CM

## 2022-02-04 DIAGNOSIS — D509 Iron deficiency anemia, unspecified: Secondary | ICD-10-CM | POA: Diagnosis not present

## 2022-02-04 MED ORDER — SODIUM CHLORIDE 0.9 % IV SOLN
200.0000 mg | Freq: Once | INTRAVENOUS | Status: AC
  Administered 2022-02-04: 200 mg via INTRAVENOUS
  Filled 2022-02-04: qty 200

## 2022-02-04 MED ORDER — SODIUM CHLORIDE 0.9 % IV SOLN
Freq: Once | INTRAVENOUS | Status: AC
  Filled 2022-02-04: qty 250

## 2022-02-04 NOTE — Patient Instructions (Signed)

## 2022-03-05 ENCOUNTER — Inpatient Hospital Stay: Payer: Medicare Other | Attending: Oncology

## 2022-03-05 DIAGNOSIS — D509 Iron deficiency anemia, unspecified: Secondary | ICD-10-CM | POA: Insufficient documentation

## 2022-03-05 DIAGNOSIS — M069 Rheumatoid arthritis, unspecified: Secondary | ICD-10-CM | POA: Diagnosis not present

## 2022-03-05 DIAGNOSIS — E538 Deficiency of other specified B group vitamins: Secondary | ICD-10-CM | POA: Diagnosis present

## 2022-03-05 DIAGNOSIS — R5383 Other fatigue: Secondary | ICD-10-CM | POA: Diagnosis not present

## 2022-03-05 LAB — RETIC PANEL
Immature Retic Fract: 23.3 % — ABNORMAL HIGH (ref 2.3–15.9)
RBC.: 2.99 MIL/uL — ABNORMAL LOW (ref 3.87–5.11)
Retic Count, Absolute: 75 10*3/uL (ref 19.0–186.0)
Retic Ct Pct: 2.5 % (ref 0.4–3.1)
Reticulocyte Hemoglobin: 28.2 pg (ref >27.9)

## 2022-03-05 LAB — CBC WITH DIFFERENTIAL/PLATELET
Abs Immature Granulocytes: 0.07 10*3/uL (ref 0.00–0.07)
Basophils Absolute: 0 10*3/uL (ref 0.0–0.1)
Basophils Relative: 0 %
Eosinophils Absolute: 0 10*3/uL (ref 0.0–0.5)
Eosinophils Relative: 0 %
HCT: 26.8 % — ABNORMAL LOW (ref 36.0–46.0)
Hemoglobin: 8.3 g/dL — ABNORMAL LOW (ref 12.0–15.0)
Immature Granulocytes: 1 %
Lymphocytes Relative: 4 %
Lymphs Abs: 0.5 10*3/uL — ABNORMAL LOW (ref 0.7–4.0)
MCH: 27.1 pg (ref 26.0–34.0)
MCHC: 31 g/dL (ref 30.0–36.0)
MCV: 87.6 fL (ref 80.0–100.0)
Monocytes Absolute: 0.3 10*3/uL (ref 0.1–1.0)
Monocytes Relative: 2 %
Neutro Abs: 12.7 10*3/uL — ABNORMAL HIGH (ref 1.7–7.7)
Neutrophils Relative %: 93 %
Platelets: 559 10*3/uL — ABNORMAL HIGH (ref 150–400)
RBC: 3.06 MIL/uL — ABNORMAL LOW (ref 3.87–5.11)
RDW: 16.9 % — ABNORMAL HIGH (ref 11.5–15.5)
WBC: 13.6 10*3/uL — ABNORMAL HIGH (ref 4.0–10.5)
nRBC: 0 % (ref 0.0–0.2)

## 2022-03-05 LAB — FERRITIN: Ferritin: 60 ng/mL (ref 11–307)

## 2022-03-05 LAB — IRON AND TIBC
Iron: 29 ug/dL (ref 28–170)
Saturation Ratios: 10 % — ABNORMAL LOW (ref 10.4–31.8)
TIBC: 300 ug/dL (ref 250–450)
UIBC: 271 ug/dL

## 2022-03-11 ENCOUNTER — Telehealth: Payer: Self-pay

## 2022-03-11 NOTE — Telephone Encounter (Signed)
Ashley Mack I spoke to patient and she states that she will need dates and times that her daughter can bring her. She said that she will have daughter call tomorrow.

## 2022-03-11 NOTE — Telephone Encounter (Signed)
Ashley Mack can you please schedule additional IV venofer weekly x 3 and notify patient of appointment dates and times.

## 2022-03-11 NOTE — Progress Notes (Signed)
Phone note created 

## 2022-03-11 NOTE — Telephone Encounter (Signed)
-----   Message from Earlie Server, MD sent at 03/10/2022  9:44 PM EST ----- Hemoglobin is still low. Please arrange him to get additonal IV venofer weekly x 3

## 2022-03-12 NOTE — Telephone Encounter (Signed)
Pt came to clinic today and appts were set up.

## 2022-03-19 ENCOUNTER — Inpatient Hospital Stay: Payer: Medicare Other | Attending: Oncology

## 2022-03-19 VITALS — BP 139/63 | HR 100 | Temp 98.2°F

## 2022-03-19 DIAGNOSIS — E538 Deficiency of other specified B group vitamins: Secondary | ICD-10-CM | POA: Diagnosis present

## 2022-03-19 DIAGNOSIS — M069 Rheumatoid arthritis, unspecified: Secondary | ICD-10-CM | POA: Diagnosis not present

## 2022-03-19 DIAGNOSIS — R5383 Other fatigue: Secondary | ICD-10-CM | POA: Diagnosis not present

## 2022-03-19 DIAGNOSIS — D509 Iron deficiency anemia, unspecified: Secondary | ICD-10-CM | POA: Diagnosis present

## 2022-03-19 MED ORDER — SODIUM CHLORIDE 0.9 % IV SOLN
INTRAVENOUS | Status: DC
  Filled 2022-03-19: qty 250

## 2022-03-19 MED ORDER — SODIUM CHLORIDE 0.9 % IV SOLN
200.0000 mg | Freq: Once | INTRAVENOUS | Status: AC
  Administered 2022-03-19: 200 mg via INTRAVENOUS
  Filled 2022-03-19: qty 200

## 2022-03-19 NOTE — Progress Notes (Signed)
Pt has been educated and understands. Pt refused to stay 30 mins after iron infusion. VSS. 

## 2022-03-25 ENCOUNTER — Inpatient Hospital Stay: Payer: Medicare Other

## 2022-03-25 VITALS — BP 133/75 | HR 103 | Temp 98.1°F | Resp 22

## 2022-03-25 DIAGNOSIS — D509 Iron deficiency anemia, unspecified: Secondary | ICD-10-CM | POA: Diagnosis not present

## 2022-03-25 DIAGNOSIS — E538 Deficiency of other specified B group vitamins: Secondary | ICD-10-CM

## 2022-03-25 MED ORDER — SODIUM CHLORIDE 0.9 % IV SOLN
200.0000 mg | Freq: Once | INTRAVENOUS | Status: AC
  Administered 2022-03-25: 200 mg via INTRAVENOUS
  Filled 2022-03-25: qty 200

## 2022-03-25 MED ORDER — SODIUM CHLORIDE 0.9 % IV SOLN
INTRAVENOUS | Status: DC
  Filled 2022-03-25: qty 250

## 2022-03-25 NOTE — Patient Instructions (Signed)

## 2022-04-02 ENCOUNTER — Inpatient Hospital Stay: Payer: Medicare Other

## 2022-04-02 VITALS — BP 108/76 | HR 104 | Temp 97.9°F | Resp 18

## 2022-04-02 DIAGNOSIS — E538 Deficiency of other specified B group vitamins: Secondary | ICD-10-CM

## 2022-04-02 DIAGNOSIS — D509 Iron deficiency anemia, unspecified: Secondary | ICD-10-CM

## 2022-04-02 MED ORDER — SODIUM CHLORIDE 0.9 % IV SOLN
200.0000 mg | Freq: Once | INTRAVENOUS | Status: AC
  Administered 2022-04-02: 200 mg via INTRAVENOUS
  Filled 2022-04-02: qty 200

## 2022-04-02 MED ORDER — SODIUM CHLORIDE 0.9 % IV SOLN
INTRAVENOUS | Status: DC
  Filled 2022-04-02: qty 250

## 2022-04-23 ENCOUNTER — Inpatient Hospital Stay: Payer: Medicare Other | Attending: Oncology

## 2022-04-23 DIAGNOSIS — R5383 Other fatigue: Secondary | ICD-10-CM | POA: Diagnosis not present

## 2022-04-23 DIAGNOSIS — M069 Rheumatoid arthritis, unspecified: Secondary | ICD-10-CM | POA: Diagnosis not present

## 2022-04-23 DIAGNOSIS — D75839 Thrombocytosis, unspecified: Secondary | ICD-10-CM | POA: Insufficient documentation

## 2022-04-23 DIAGNOSIS — D509 Iron deficiency anemia, unspecified: Secondary | ICD-10-CM | POA: Diagnosis present

## 2022-04-23 DIAGNOSIS — E538 Deficiency of other specified B group vitamins: Secondary | ICD-10-CM | POA: Diagnosis present

## 2022-04-23 LAB — CBC WITH DIFFERENTIAL/PLATELET
Abs Immature Granulocytes: 0.3 10*3/uL — ABNORMAL HIGH (ref 0.00–0.07)
Basophils Absolute: 0.1 10*3/uL (ref 0.0–0.1)
Basophils Relative: 0 %
Eosinophils Absolute: 0 10*3/uL (ref 0.0–0.5)
Eosinophils Relative: 0 %
HCT: 28.6 % — ABNORMAL LOW (ref 36.0–46.0)
Hemoglobin: 8.8 g/dL — ABNORMAL LOW (ref 12.0–15.0)
Immature Granulocytes: 2 %
Lymphocytes Relative: 4 %
Lymphs Abs: 0.6 10*3/uL — ABNORMAL LOW (ref 0.7–4.0)
MCH: 28.9 pg (ref 26.0–34.0)
MCHC: 30.8 g/dL (ref 30.0–36.0)
MCV: 93.8 fL (ref 80.0–100.0)
Monocytes Absolute: 0.4 10*3/uL (ref 0.1–1.0)
Monocytes Relative: 3 %
Neutro Abs: 13.4 10*3/uL — ABNORMAL HIGH (ref 1.7–7.7)
Neutrophils Relative %: 91 %
Platelets: 455 10*3/uL — ABNORMAL HIGH (ref 150–400)
RBC: 3.05 MIL/uL — ABNORMAL LOW (ref 3.87–5.11)
RDW: 17.5 % — ABNORMAL HIGH (ref 11.5–15.5)
WBC: 14.7 10*3/uL — ABNORMAL HIGH (ref 4.0–10.5)
nRBC: 0 % (ref 0.0–0.2)

## 2022-04-23 LAB — IRON AND TIBC
Iron: 147 ug/dL (ref 28–170)
Saturation Ratios: 47 % — ABNORMAL HIGH (ref 10.4–31.8)
TIBC: 314 ug/dL (ref 250–450)
UIBC: 167 ug/dL

## 2022-04-23 LAB — RETIC PANEL
Immature Retic Fract: 18.5 % — ABNORMAL HIGH (ref 2.3–15.9)
RBC.: 3.07 MIL/uL — ABNORMAL LOW (ref 3.87–5.11)
Retic Count, Absolute: 90.6 10*3/uL (ref 19.0–186.0)
Retic Ct Pct: 3 % (ref 0.4–3.1)
Reticulocyte Hemoglobin: 29.8 pg (ref >27.9)

## 2022-04-23 LAB — FERRITIN: Ferritin: 94 ng/mL (ref 11–307)

## 2022-04-28 ENCOUNTER — Encounter: Payer: Self-pay | Admitting: Oncology

## 2022-04-28 ENCOUNTER — Inpatient Hospital Stay (HOSPITAL_BASED_OUTPATIENT_CLINIC_OR_DEPARTMENT_OTHER): Payer: Medicare Other | Admitting: Oncology

## 2022-04-28 ENCOUNTER — Inpatient Hospital Stay: Payer: Medicare Other

## 2022-04-28 VITALS — BP 117/79 | HR 108 | Temp 97.5°F | Wt 122.5 lb

## 2022-04-28 VITALS — BP 132/63 | HR 97 | Temp 97.9°F | Resp 18

## 2022-04-28 DIAGNOSIS — E538 Deficiency of other specified B group vitamins: Secondary | ICD-10-CM

## 2022-04-28 DIAGNOSIS — D75839 Thrombocytosis, unspecified: Secondary | ICD-10-CM | POA: Diagnosis not present

## 2022-04-28 DIAGNOSIS — D509 Iron deficiency anemia, unspecified: Secondary | ICD-10-CM

## 2022-04-28 MED ORDER — SODIUM CHLORIDE 0.9 % IV SOLN
Freq: Once | INTRAVENOUS | Status: AC
  Filled 2022-04-28: qty 250

## 2022-04-28 MED ORDER — SODIUM CHLORIDE 0.9 % IV SOLN
200.0000 mg | Freq: Once | INTRAVENOUS | Status: AC
  Administered 2022-04-28: 200 mg via INTRAVENOUS
  Filled 2022-04-28: qty 200

## 2022-04-28 NOTE — Progress Notes (Signed)
Hematology oncology progress note   ASSESSMENT & PLAN:   Iron deficiency anemia #Recurrent chronic iron deficiency anemia, status post IV Venofer treatments. Labs reviewed and discussed with patient. iron saturation 46,  ferritin 94.  Hemoglobin 8.8 I discussed with patient that despite iron panel has improved, hemoglobin has failed to improve.  I suspect that patient has underlying bone marrow disorders and recommend bone marrow biopsy. Patient has been reluctant to bone marrow biopsy and request time to consider and update me. She is agreeable with 1 more dose of Venofer   B12 deficiency Vitamin B12 deficiency, patient is on vitamin B12  injection monthly.  Patient prefers to have her chiropractor admitting B12 for her.  Thrombocytosis Thrombocytosis persistent despite iron deficiency improves. Check myeloproliferative disease panel.   Orders Placed This Encounter  Procedures   CBC with Differential/Platelet    Standing Status:   Future    Standing Expiration Date:   04/28/2023   Ferritin    Standing Status:   Future    Standing Expiration Date:   04/29/2023   Iron and TIBC    Standing Status:   Future    Standing Expiration Date:   04/29/2023   Repeat blood work in 3 weeks.  Follow up in 3 months.   All questions were answered. The patient knows to call the clinic with any problems, questions or concerns.  Earlie Server, MD, PhD [facility] Hematology Oncology 04/28/2022    Chief Complaint: Ashley Mack is a 77 y.o. female with iron deficiency anemia and B12 deficiency  presents to follow-up.    INTERVAL HISTORY Ashley Mack is a 77 y.o. female who has above history reviewed by me today presents for follow up visit for management of anemia and B12 deficiency Problems and complaints are listed below: Patient previously followed up by Dr.Corcoran, patient switched care to me on 08/15/20 Extensive medical record review was performed by me  Chronic recurrent iron  deficiency anemia   She had a colonoscopy on 03/18/2009. She has had several EGDs with the last on 10/12/2008.  She states that she has had polyps, reflux and a hiatal hernia.EGD on 12/09/2016 was normal, other than demonstrating a small hiatal hernia. Colonoscopy on 12/09/2016 demonstrated friability with contact bleeding in the proximal transverse colon. A 1 mm sessile polyp was found in the sigmoid colon, in addition to grade II non-bleeding internal hemorrhoids were also noted. Pathology results demonstrated a tubular adenoma that was negative for dyplasia or malignancy. 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   Patient has had a multiple IV Venofer treatments. receives Venofer if her ferritin is < 30.   Vitamin B12 deficiency, She began weekly B12 on 09/08/2016 and continued x 6 weeks (last 10/16/2016). She began monthly B12 on 11/26/2016 (last 02/17/2018).  Patient used to get vitamin B12 prescription from Dr. Mike Gip and her chiropractor injects for her.   She has rheumatoid arthritis.  She has been treated with methotrexate, Imuran, leflunomide, and sulfasalazine.  She is on prednisone 5 mg a day x 2 years.  She is s/p kyphoplasty x 5 since 04/2017.    INTERVAL HISTORY Ashley Mack is a 77 y.o. female who has above history reviewed by me today presents for follow up visit for management of vitamin B12 deficiency and iron deficiency anemia.  S/p IV multiple doses of IV Venofer treatments.. She feels fatigue is slightly better.  Denies any bloody or black stool.    Review of Systems  Constitutional:  Positive for fatigue. Negative for appetite change and unexpected weight change.  HENT:   Negative for mouth sores.   Respiratory:  Negative for shortness of breath.   Cardiovascular:  Negative for chest pain.  Gastrointestinal:  Negative for abdominal pain and blood in stool.  Genitourinary:  Negative for dysuria.   Musculoskeletal:  Positive for arthralgias and back pain.   Neurological:  Negative for headaches.  Psychiatric/Behavioral:  Negative for confusion.      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)    Iron (Fe) deficiency anemia    Migraine headache    in past   Migraines    Motion sickness    No natural teeth    Osteoporosis    Rheumatoid arthritis ([place])    Vertigo     Past Surgical History:  Procedure Laterality Date   CATARACT EXTRACTION W/PHACO Right 08/12/2016   Procedure: CATARACT EXTRACTION PHACO AND INTRAOCULAR LENS PLACEMENT ([place])  Right;  Surgeon: Leandrew Koyanagi, MD;  Location: [place];  Service: Ophthalmology;  Laterality: Right;   CATARACT EXTRACTION W/PHACO Left 09/30/2016   Procedure: CATARACT EXTRACTION PHACO AND INTRAOCULAR LENS PLACEMENT ([place]) Left;  Surgeon: Leandrew Koyanagi, MD;  Location: [place];  Service: Ophthalmology;  Laterality: Left;   COLONOSCOPY     COLONOSCOPY WITH PROPOFOL N/A 12/09/2016   Procedure: COLONOSCOPY WITH PROPOFOL;  Surgeon: Toledo, Benay Pike, MD;  Location: [facility];  Service: Endoscopy;  Laterality: N/A;   ESOPHAGOGASTRODUODENOSCOPY     ESOPHAGOGASTRODUODENOSCOPY (EGD) WITH PROPOFOL N/A 12/09/2016   Procedure: ESOPHAGOGASTRODUODENOSCOPY (EGD) WITH PROPOFOL;  Surgeon: Toledo, Benay Pike, MD;  Location: [facility];  Service: Endoscopy;  Laterality: N/A;   KYPHOPLASTY N/A 08/10/2017   Procedure: Hewitt Shorts;  Surgeon: Hessie Knows, MD;  Location: [facility];  Service: Orthopedics;  Laterality: N/A;   KYPHOPLASTY N/A 09/09/2017   Procedure: [id number];  Surgeon: Hessie Knows, MD;  Location: [facility];  Service: Orthopedics;   Laterality: N/A;   KYPHOPLASTY N/A 09/28/2017   Procedure: [id number];  Surgeon: Hessie Knows, MD;  Location: [facility];  Service: Orthopedics;  Laterality: N/A;   KYPHOPLASTY N/A 06/19/2019   Procedure: KYPHOPLASTY T5;  Surgeon: Hessie Knows, MD;  Location: [facility];  Service: Orthopedics;  Laterality: N/A;   OOPHORECTOMY Bilateral 2003   RIGHT/LEFT HEART CATH AND CORONARY ANGIOGRAPHY Bilateral 10/19/2019   Procedure: RIGHT/LEFT HEART CATH AND CORONARY ANGIOGRAPHY;  Surgeon: Yolonda Kida, MD;  Location: [place] [facility];  Service: Cardiovascular;  Laterality: Bilateral;    Family History  Problem Relation Age of Onset   Kidney disease Mother    Diabetes Mellitus II Brother     Social History:  reports that she quit smoking about 15 years ago. Her smoking use included cigarettes. She smoked an average of 1 pack per day. She has never used smokeless tobacco. She reports that she does not drink alcohol and does not use drugs. She quit smoking in 2009.  She lives in [place]. Her step daughter is Museum/gallery conservator. The patient is accompanied by [employer] today.  Allergies:  Allergies  Allergen Reactions  Methotrexate Derivatives Nausea And Vomiting and Other (See Comments)    Flu like symptoms   Other Nausea Only and Other (See Comments)    Arthritis medications . Unsure of what the med was.   Ranitidine Hives   Sulfasalazine Other (See Comments)    Stomach upset   Topiramate Nausea Only    Current Medications: Current Outpatient Medications  Medication Sig Dispense Refill   albuterol (VENTOLIN HFA) 108 (90 Base) MCG/ACT inhaler Inhale 2 puffs into the lungs every 6 (six) hours as needed for wheezing or shortness of breath.      budesonide-formoterol (SYMBICORT) 160-4.5 MCG/ACT inhaler Inhale 2 puffs into the lungs 2 (two) times daily.     Cholecalciferol (VITAMIN D3) 2000 UNITS capsule Take 2,000 Units by mouth daily.      cyanocobalamin (,VITAMIN B-12,) 1000 MCG/ML injection INJECT  1ML INTRAMUSCULARLY EVERY 30 DAYS 10 mL 1   cyclobenzaprine (FLEXERIL) 5 MG tablet Take 5 mg by mouth 2 (two) times daily.     diclofenac Sodium (VOLTAREN) 1 % GEL Apply 2 g topically as needed.     Fe Fum-FePoly-Vit C-Vit B3 (INTEGRA) 62.5-62.5-40-3 MG CAPS Take 1 capsule by mouth daily.     predniSONE (DELTASONE) 5 MG tablet Take 5 mg by mouth daily.      No current facility-administered medications for this visit.      Performance status (ECOG): 1  Vitals Blood pressure 117/79, pulse (!) 108, temperature (!) 97.5 F (36.4 C), temperature source Tympanic, weight 122 lb 8 oz (55.6 kg), SpO2 97 %.   Physical Exam Vitals reviewed.  Constitutional:      General: She is not in acute distress.    Appearance: She is well-developed. She is not diaphoretic.     Comments: Patient sits in the wheelchair  Eyes:     General: No scleral icterus. Cardiovascular:     Rate and Rhythm: Normal rate.  Pulmonary:     Effort: Pulmonary effort is normal. No respiratory distress.  Abdominal:     General: There is no distension.     Palpations: Abdomen is soft.  Musculoskeletal:        General: Normal range of motion.     Cervical back: Normal range of motion and neck supple.  Lymphadenopathy:     Cervical: No cervical adenopathy.     Upper Body:     Right upper body: No supraclavicular adenopathy.     Left upper body: No supraclavicular adenopathy.  Skin:    General: Skin is warm and dry.     Findings: Bruising present.  Neurological:     Mental Status: She is alert. Mental status is at baseline.  Psychiatric:        Mood and Affect: Mood normal.

## 2022-04-28 NOTE — Assessment & Plan Note (Signed)
Vitamin B12 deficiency, patient is on vitamin B12  injection monthly.  Patient prefers to have her chiropractor admitting B12 for her.

## 2022-04-28 NOTE — Assessment & Plan Note (Addendum)
#  Recurrent chronic iron deficiency anemia, status post IV Venofer treatments. Labs reviewed and discussed with patient. iron saturation 46,  ferritin 94.  Hemoglobin 8.8 I discussed with patient that despite iron panel has improved, hemoglobin has failed to improve.  I suspect that patient has underlying bone marrow disorders and recommend bone marrow biopsy. Patient has been reluctant to bone marrow biopsy and request time to consider and update me. She is agreeable with 1 more dose of Venofer

## 2022-04-28 NOTE — Assessment & Plan Note (Signed)
Thrombocytosis persistent despite iron deficiency improves. Check myeloproliferative disease panel.

## 2022-06-22 ENCOUNTER — Other Ambulatory Visit: Payer: Self-pay | Admitting: Oncology

## 2022-06-23 ENCOUNTER — Encounter: Payer: Self-pay | Admitting: Hematology and Oncology

## 2022-07-23 ENCOUNTER — Inpatient Hospital Stay: Payer: Medicare Other | Attending: Oncology

## 2022-07-23 DIAGNOSIS — R5383 Other fatigue: Secondary | ICD-10-CM | POA: Insufficient documentation

## 2022-07-23 DIAGNOSIS — D509 Iron deficiency anemia, unspecified: Secondary | ICD-10-CM | POA: Diagnosis present

## 2022-07-23 DIAGNOSIS — M069 Rheumatoid arthritis, unspecified: Secondary | ICD-10-CM | POA: Insufficient documentation

## 2022-07-23 DIAGNOSIS — D72829 Elevated white blood cell count, unspecified: Secondary | ICD-10-CM | POA: Insufficient documentation

## 2022-07-23 DIAGNOSIS — Z7952 Long term (current) use of systemic steroids: Secondary | ICD-10-CM | POA: Diagnosis not present

## 2022-07-23 DIAGNOSIS — E538 Deficiency of other specified B group vitamins: Secondary | ICD-10-CM | POA: Diagnosis present

## 2022-07-23 DIAGNOSIS — D75839 Thrombocytosis, unspecified: Secondary | ICD-10-CM | POA: Insufficient documentation

## 2022-07-23 DIAGNOSIS — Z87891 Personal history of nicotine dependence: Secondary | ICD-10-CM | POA: Insufficient documentation

## 2022-07-23 LAB — CBC WITH DIFFERENTIAL/PLATELET
Abs Immature Granulocytes: 0.1 10*3/uL — ABNORMAL HIGH (ref 0.00–0.07)
Basophils Absolute: 0.1 10*3/uL (ref 0.0–0.1)
Basophils Relative: 0 %
Eosinophils Absolute: 0 10*3/uL (ref 0.0–0.5)
Eosinophils Relative: 0 %
HCT: 27.2 % — ABNORMAL LOW (ref 36.0–46.0)
Hemoglobin: 8.3 g/dL — ABNORMAL LOW (ref 12.0–15.0)
Immature Granulocytes: 1 %
Lymphocytes Relative: 5 %
Lymphs Abs: 0.8 10*3/uL (ref 0.7–4.0)
MCH: 27.2 pg (ref 26.0–34.0)
MCHC: 30.5 g/dL (ref 30.0–36.0)
MCV: 89.2 fL (ref 80.0–100.0)
Monocytes Absolute: 0.5 10*3/uL (ref 0.1–1.0)
Monocytes Relative: 3 %
Neutro Abs: 14.9 10*3/uL — ABNORMAL HIGH (ref 1.7–7.7)
Neutrophils Relative %: 91 %
Platelets: 529 10*3/uL — ABNORMAL HIGH (ref 150–400)
RBC: 3.05 MIL/uL — ABNORMAL LOW (ref 3.87–5.11)
RDW: 15.9 % — ABNORMAL HIGH (ref 11.5–15.5)
WBC: 16.4 10*3/uL — ABNORMAL HIGH (ref 4.0–10.5)
nRBC: 0 % (ref 0.0–0.2)

## 2022-07-23 LAB — FERRITIN: Ferritin: 22 ng/mL (ref 11–307)

## 2022-07-23 LAB — IRON AND TIBC
Iron: 20 ug/dL — ABNORMAL LOW (ref 28–170)
Saturation Ratios: 5 % — ABNORMAL LOW (ref 10.4–31.8)
TIBC: 371 ug/dL (ref 250–450)
UIBC: 351 ug/dL

## 2022-07-24 ENCOUNTER — Other Ambulatory Visit: Payer: Medicare Other

## 2022-07-28 ENCOUNTER — Ambulatory Visit
Admission: RE | Admit: 2022-07-28 | Discharge: 2022-07-28 | Disposition: A | Discharge status: Home / Self Care | Payer: Medicare Other | Source: Ambulatory Visit | Attending: Urgent Care | Admitting: Urgent Care

## 2022-07-28 VITALS — BP 143/63 | HR 113 | Temp 98.9°F | Ht 62.0 in | Wt 133.0 lb

## 2022-07-28 DIAGNOSIS — H6991 Unspecified Eustachian tube disorder, right ear: Secondary | ICD-10-CM

## 2022-07-28 DIAGNOSIS — M25521 Pain in right elbow: Secondary | ICD-10-CM | POA: Diagnosis not present

## 2022-07-28 LAB — BCR-ABL1 FISH
Cells Analyzed: 200
Cells Counted: 200

## 2022-07-28 MED ORDER — PREDNISONE 10 MG (21) PO TBPK
ORAL_TABLET | Freq: Every day | ORAL | 0 refills | Status: DC
Start: 2022-07-28 — End: 2022-11-26

## 2022-07-28 NOTE — Discharge Instructions (Signed)
Follow-up on your elbow pain with orthopedic evaluation if your symptoms do not resolve within a few weeks.  Follow-up on your ear pain with evaluation by an ear doctor (otolaryngologist) if your symptoms do not resolve with treatment.

## 2022-07-28 NOTE — ED Triage Notes (Signed)
Pt c/o ears clogged in right side and feels "crunchy" and right elbow problem.  Pt states that she hit her right elbow and now has swelling and is unsure what is causing it.

## 2022-07-28 NOTE — ED Provider Notes (Signed)
[facility]    CSN: [id number] Arrival date & time: 07/28/22  1046      History   Chief Complaint No chief complaint on file.   HPI Ashley Mack is a 77 y.o. female.   HPI  Patient presents to urgent care with multiple complaints.  1.  Complains of right ear "clogged" and feels "crunchy".  2. Complaints of pain and swelling in her right elbow after striking the elbow on a hard surface. Pain when she leans on the elbow. No pain with movement other than baseline arthritic pain.  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)    Iron (Fe) deficiency anemia    Migraine headache    in past   Migraines    Motion sickness    No natural teeth    Osteoporosis    Rheumatoid arthritis (HCC)    Vertigo     Patient Active Problem List   Diagnosis Date Noted   Thrombocytosis 04/28/2022   Fatty liver 07/20/2021   Hernia 07/20/2021   COPD with acute exacerbation (HCC) 03/19/2021   COPD exacerbation (HCC) 03/18/2021   Thoracic disc herniation 11/06/2019   Arthropathy of thoracic facet joint 11/06/2019   Cervical facet joint syndrome 11/06/2019   Statin declined 11/02/2019   Closed wedge compression fracture of T8 vertebra (HCC) 09/20/2019   Elevated blood-pressure reading, without diagnosis of hypertension 09/20/2019   GERD (gastroesophageal reflux disease) 06/18/2019   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HCC) 06/18/2019   H/O kyphoplasty 06/18/2019   Compression fracture of body of thoracic vertebra (HCC) 06/16/2019   At high risk for falls 05/05/2018   Pain in thoracic spine 11/18/2017   Flank pain    Intractable back  pain 08/08/2017   Compression fracture of lumbar vertebra (HCC) 08/08/2017   Abdominal pain, RUQ 08/06/2017   Healthcare maintenance 05/06/2017   Guaiac positive stools 11/26/2016   Iron deficiency anemia 09/01/2016   B12 deficiency 08/31/2016   Normocytic anemia 08/30/2016   Centrilobular emphysema (HCC) 05/09/2015   Chronic fatigue 06/21/2014   SOB (shortness of breath) on exertion 06/21/2014   Spondylosis of thoracic region without myelopathy or radiculopathy 01/22/2014   Aortic atherosclerosis (HCC) 12/25/2013   HTN (hypertension), benign 12/10/2013   Chronic insomnia 12/10/2013   Migraines 12/10/2013   Osteoporosis 08/03/2013   Retinal disease 08/03/2013   Scotoma involving central area in visual field 03/29/2012    Past Surgical History:  Procedure Laterality Date   CATARACT EXTRACTION W/PHACO Right 08/12/2016   Procedure: CATARACT EXTRACTION PHACO AND INTRAOCULAR LENS PLACEMENT (IOC)  Right;  Surgeon: Lockie Mola, MD;  Location: [facility];  Service: Ophthalmology;  Laterality: Right;   CATARACT EXTRACTION W/PHACO Left 09/30/2016   Procedure: CATARACT EXTRACTION PHACO AND INTRAOCULAR LENS PLACEMENT (IOC) Left;  Surgeon: Lockie Mola, MD;  Location: [facility];  Service: Ophthalmology;  Laterality: Left;   COLONOSCOPY     COLONOSCOPY WITH PROPOFOL N/A 12/09/2016   Procedure: COLONOSCOPY WITH PROPOFOL;  Surgeon: Toledo, Boykin Nearing, MD;  Location: [facility];  Service: Endoscopy;  Laterality: N/A;  ESOPHAGOGASTRODUODENOSCOPY     ESOPHAGOGASTRODUODENOSCOPY (EGD) WITH PROPOFOL N/A 12/09/2016   Procedure: ESOPHAGOGASTRODUODENOSCOPY (EGD) WITH PROPOFOL;  Surgeon: Toledo, Boykin Nearing, MD;  Location: [facility];  Service: Endoscopy;  Laterality: N/A;   KYPHOPLASTY N/A 08/10/2017   Procedure: Nicki Reaper;  Surgeon: Kennedy Bucker, MD;  Location: [facility];  Service: Orthopedics;  Laterality: N/A;   KYPHOPLASTY N/A 09/09/2017   Procedure:  [id number]-O1,H0;  Surgeon: Kennedy Bucker, MD;  Location: [facility];  Service: Orthopedics;  Laterality: N/A;   KYPHOPLASTY N/A 09/28/2017   Procedure: [id number]-B28;  Surgeon: Kennedy Bucker, MD;  Location: [facility];  Service: Orthopedics;  Laterality: N/A;   KYPHOPLASTY N/A 06/19/2019   Procedure: KYPHOPLASTY T5;  Surgeon: Kennedy Bucker, MD;  Location: [facility];  Service: Orthopedics;  Laterality: N/A;   OOPHORECTOMY Bilateral 2003   RIGHT/LEFT HEART CATH AND CORONARY ANGIOGRAPHY Bilateral 10/19/2019   Procedure: RIGHT/LEFT HEART CATH AND CORONARY ANGIOGRAPHY;  Surgeon: Alwyn Pea, MD;  Location: [facility];  Service: Cardiovascular;  Laterality: Bilateral;    OB History     Gravida  1   Para      Term      Preterm      AB  1   Living         SAB      IAB      Ectopic      Multiple      Live Births               Home Medications    Prior to Admission medications   Medication Sig Start Date End Date Taking? Authorizing Provider  albuterol (VENTOLIN HFA) 108 (90 Base) MCG/ACT inhaler Inhale 2 puffs into the lungs every 6 (six) hours as needed for wheezing or shortness of breath.     [provider]  budesonide-formoterol (SYMBICORT) 160-4.5 MCG/ACT inhaler Inhale 2 puffs into the lungs 2 (two) times daily.    [provider]  Cholecalciferol (VITAMIN D3) 2000 UNITS capsule Take 2,000 Units by mouth daily.     [provider]  cyanocobalamin (VITAMIN B12) 1000 MCG/ML injection INJECT INTO THE MUSCLE ONCE EVERY 30 DAYS 06/23/22   Rickard Patience, MD  cyclobenzaprine (FLEXERIL) 5 MG tablet Take 5 mg by mouth 2 (two) times daily.    [provider]  diclofenac Sodium (VOLTAREN) 1 % GEL Apply 2 g topically as needed.    [provider]  Fe Fum-FePoly-Vit C-Vit B3 (INTEGRA) 62.5-62.5-40-3 MG CAPS Take 1 capsule by mouth daily. 11/29/13   [provider]  predniSONE (DELTASONE) 5 MG tablet Take 5 mg by mouth  daily.     [provider]    Family History Family History  Problem Relation Age of Onset   Kidney disease Mother    Diabetes Mellitus II Brother     Social History Social History   Tobacco Use   Smoking status: Former    Packs/day: 1    Types: Cigarettes    Quit date: 2009    Years since quitting: 15.3   Smokeless tobacco: Never  Vaping Use   Vaping Use: Never used  Substance Use Topics   Alcohol use: No   Drug use: No     Allergies   Methotrexate derivatives, Other, Ranitidine, Sulfasalazine, and Topiramate   Review of Systems Review of Systems   Physical Exam Triage Vital Signs ED Triage Vitals  Enc Vitals Group     BP      Pulse  Resp      Temp      Temp src      SpO2      Weight      Height      Head Circumference      Peak Flow      Pain Score      Pain Loc      Pain Edu?      Excl. in GC?    No data found.  Updated Vital Signs There were no vitals taken for this visit.  Visual Acuity Right Eye Distance:   Left Eye Distance:   Bilateral Distance:    Right Eye Near:   Left Eye Near:    Bilateral Near:     Physical Exam Vitals reviewed.  Constitutional:      Appearance: Normal appearance.  HENT:     Right Ear: A middle ear effusion is present. There is no impacted cerumen. Tympanic membrane is not erythematous.     Left Ear:  No middle ear effusion. There is no impacted cerumen. Tympanic membrane is not erythematous.  Cardiovascular:     Rate and Rhythm: Normal rate and regular rhythm.     Pulses: Normal pulses.     Heart sounds: Normal heart sounds.  Pulmonary:     Effort: Pulmonary effort is normal.     Breath sounds: Normal breath sounds.  Musculoskeletal:     Right elbow: Swelling present. Tenderness present.     Comments: Tenderness at the right elbow with palpation of the skin overlying the olecranon process.  No bony tenderness.  No decreased range of motion.  No pain with movement.  Soft tissue swelling  over the olecranon process.  Of swelling is cystic in nature and mobile though attached to the dermis.  Skin:    General: Skin is warm and dry.  Neurological:     General: No focal deficit present.     Mental Status: She is alert and oriented to person, place, and time.  Psychiatric:        Mood and Affect: Mood normal.        Behavior: Behavior normal.      UC Treatments / Results  Labs (all labs ordered are listed, but only abnormal results are displayed) Labs Reviewed - No data to display  EKG   Radiology No results found.  Procedures Procedures (including critical care time)  Medications Ordered in UC Medications - No data to display  Initial Impression / Assessment and Plan / UC Course  I have reviewed the triage vital signs and the nursing notes.  Pertinent labs & imaging results that were available during my care of the patient were reviewed by me and considered in my medical decision making (see chart for details).   Ashley Mack is a 77 y.o. female presenting with R elbow pain and reduced hearing in R ear. Patient is afebrile without recent antipyretics, satting well on room air. Overall is well appearing, well hydrated, without respiratory distress. Pulmonary exam is unremarkable.  Middle ear effusion behind the right TM.  TM is not erythematous or bulging.  Opaque.  Reduced hearing in the right.  Left TM and EACs is WNL.  There is no cerumen burden bilaterally.  Loose tissue over the right olecranon process.  There seems to be a cystic lesion, soft and seemingly fluid-filled.  There is tenderness with palpation.  No bony tenderness.  No tenderness with movement of the joint other than baseline arthritic pain.  Elbow is likely soft tissue injury.  Recommending watch and wait.  Asking patient to see her PCP or orthopedic referral if lesion does not resolve without treatment after a few weeks, possibly needing drainage of effusion.  Treating likely right eustachian tube  dysfunction with a prednisone taper.  Reviewed chart history. Additional history obtained from patient family/caregiver present during the exam.  Counseled patient on potential for adverse effects with medications prescribed/recommended today, ER and return-to-clinic precautions discussed, patient verbalized understanding and agreement with care plan.  Final Clinical Impressions(s) / UC Diagnoses   Final diagnoses:  None   Discharge Instructions   None    ED Prescriptions   None    PDMP not reviewed this encounter.   Charma Igo, [place] 07/28/22 1137

## 2022-07-29 ENCOUNTER — Ambulatory Visit: Payer: Medicare Other

## 2022-07-29 ENCOUNTER — Ambulatory Visit: Payer: Medicare Other | Admitting: Oncology

## 2022-07-30 ENCOUNTER — Inpatient Hospital Stay (HOSPITAL_BASED_OUTPATIENT_CLINIC_OR_DEPARTMENT_OTHER): Payer: Medicare Other | Admitting: Oncology

## 2022-07-30 ENCOUNTER — Inpatient Hospital Stay: Payer: Medicare Other

## 2022-07-30 ENCOUNTER — Encounter: Payer: Self-pay | Admitting: Oncology

## 2022-07-30 VITALS — BP 117/61 | HR 110 | Temp 97.2°F | Resp 18 | Wt 127.2 lb

## 2022-07-30 DIAGNOSIS — D72828 Other elevated white blood cell count: Secondary | ICD-10-CM | POA: Diagnosis not present

## 2022-07-30 DIAGNOSIS — E538 Deficiency of other specified B group vitamins: Secondary | ICD-10-CM

## 2022-07-30 DIAGNOSIS — D509 Iron deficiency anemia, unspecified: Secondary | ICD-10-CM | POA: Diagnosis not present

## 2022-07-30 DIAGNOSIS — D75839 Thrombocytosis, unspecified: Secondary | ICD-10-CM | POA: Diagnosis not present

## 2022-07-30 DIAGNOSIS — D72829 Elevated white blood cell count, unspecified: Secondary | ICD-10-CM | POA: Insufficient documentation

## 2022-07-30 MED ORDER — SODIUM CHLORIDE 0.9 % IV SOLN
200.0000 mg | Freq: Once | INTRAVENOUS | Status: AC
  Administered 2022-07-30: 200 mg via INTRAVENOUS
  Filled 2022-07-30: qty 200

## 2022-07-30 MED ORDER — SODIUM CHLORIDE 0.9 % IV SOLN
Freq: Once | INTRAVENOUS | Status: AC
  Filled 2022-07-30: qty 250

## 2022-07-30 NOTE — Assessment & Plan Note (Signed)
#  Recurrent chronic iron deficiency anemia, status post IV Venofer treatments. Labs reviewed and discussed with patient. Lab Results  Component Value Date   HGB 8.3 (L) 07/23/2022   TIBC 371 07/23/2022   IRONPCTSAT 5 (L) 07/23/2022   FERRITIN 22 07/23/2022    Persistent iron deficiency with low hemoglobin. I recommend IV Venofer weekly x 4.  Etiology of iron deficiency, I suspect that she has ongoing GI blood loss.  I have urged patient to make follow-up appointment with GI Previously her hemoglobin did not improve with better iron panel.  There may be underlying bone marrow disorders. I have previously recommended bone marrow biopsy.  He declined.

## 2022-07-30 NOTE — Assessment & Plan Note (Signed)
Possible secondary to steroid use.

## 2022-07-30 NOTE — Progress Notes (Signed)
Hematology oncology progress note   ASSESSMENT & PLAN:   Iron deficiency anemia #Recurrent chronic iron deficiency anemia, status post IV Venofer treatments. Labs reviewed and discussed with patient. Lab Results  Component Value Date   HGB 8.3 (L) 07/23/2022   TIBC 371 07/23/2022   IRONPCTSAT 5 (L) 07/23/2022   FERRITIN 22 07/23/2022    Persistent iron deficiency with low hemoglobin. I recommend IV Venofer weekly x 4.  Etiology of iron deficiency, I suspect that she has ongoing GI blood loss.  I have urged patient to make follow-up appointment with GI Previously her hemoglobin did not improve with better iron panel.  There may be underlying bone marrow disorders. I have previously recommended bone marrow biopsy.  He declined.  B12 deficiency Vitamin B12 deficiency, patient is on vitamin B12  injection monthly.  Patient prefers to have her chiropractor admitting B12 for her.  Thrombocytosis Thrombocytosis, reactive versus reactive underlying bone marrow disorders. 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   Orders Placed This Encounter  Procedures   CBC with Differential (Cancer Center Only)    Standing Status:   Future    Standing Expiration Date:   07/30/2023   Iron and TIBC    Standing Status:   Future    Standing Expiration Date:   07/30/2023   Ferritin    Standing Status:   Future    Standing Expiration Date:   07/30/2023   Retic Panel    Standing Status:   Future    Standing Expiration Date:   07/30/2023   Repeat blood work in 3 weeks.  Follow up in 3 months.   All questions were answered. The patient knows to call the clinic with any problems, questions or concerns.  Rickard Patience, MD, PhD [facility] Hematology Oncology 07/30/2022    Chief Complaint: Ashley Mack is a 77 y.o. female with iron deficiency anemia and B12 deficiency   presents to follow-up.    INTERVAL HISTORY Ashley Mack is a 77 y.o. female who has above history reviewed by me today presents for follow up visit for management of anemia and B12 deficiency Problems and complaints are listed below: Patient previously followed up by Dr.Corcoran, patient switched care to me on 08/15/20 Extensive medical record review was performed by me  Chronic recurrent iron deficiency anemia   She had a colonoscopy on 03/18/2009. She has had several EGDs with the last on 10/12/2008.  She states that she has had polyps, reflux and a hiatal hernia.EGD on 12/09/2016 was normal, other than demonstrating a small hiatal hernia. Colonoscopy on 12/09/2016 demonstrated friability with contact bleeding in the proximal transverse colon. A 1 mm sessile polyp was found in the sigmoid colon, in addition to grade II non-bleeding internal hemorrhoids were also noted. Pathology results demonstrated a tubular adenoma that was negative for dyplasia or malignancy. 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   Patient has had a multiple IV Venofer treatments. receives Venofer if her ferritin is < 30.   Vitamin B12 deficiency, She began weekly B12 on 09/08/2016 and continued x 6 weeks (last 10/16/2016). She began monthly B12 on 11/26/2016 (last 02/17/2018).  Patient used to get vitamin B12 prescription from Dr. Merlene Pulling and her chiropractor injects for her.   She has rheumatoid arthritis.  She has been treated with methotrexate, Imuran, leflunomide, and sulfasalazine.  She is on prednisone  5 mg a day x 2 years.  She is s/p kyphoplasty x 5 since 04/2017.    INTERVAL HISTORY Ashley Mack is a 77 y.o. female who has above history reviewed by me today presents for follow up visit for management of vitamin B12 deficiency and iron deficiency anemia.  S/p IV multiple doses of IV Venofer treatments.. She feels at her baseline.  Chronic fatigue unchanged. Denies any bloody or black stool.    Review  of Systems  Constitutional:  Positive for fatigue. Negative for appetite change and unexpected weight change.  HENT:   Negative for mouth sores.   Respiratory:  Negative for shortness of breath.   Cardiovascular:  Negative for chest pain.  Gastrointestinal:  Negative for abdominal pain and blood in stool.  Genitourinary:  Negative for dysuria.   Musculoskeletal:  Positive for arthralgias and back pain.  Neurological:  Negative for headaches.  Psychiatric/Behavioral:  Negative for confusion.      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)    Iron (Fe) deficiency anemia    Migraine headache    in past   Migraines    Motion sickness    No natural teeth    Osteoporosis    Rheumatoid arthritis (HCC)    Vertigo     Past Surgical History:  Procedure Laterality Date   CATARACT EXTRACTION W/PHACO Right 08/12/2016   Procedure: CATARACT EXTRACTION PHACO AND INTRAOCULAR LENS PLACEMENT (IOC)  Right;  Surgeon: Lockie Mola, MD;  Location: [facility];  Service: Ophthalmology;  Laterality: Right;   CATARACT EXTRACTION W/PHACO Left 09/30/2016   Procedure: CATARACT EXTRACTION PHACO AND INTRAOCULAR LENS PLACEMENT (IOC) Left;  Surgeon: Lockie Mola, MD;  Location: [facility];  Service: Ophthalmology;  Laterality: Left;   COLONOSCOPY     COLONOSCOPY WITH PROPOFOL N/A 12/09/2016   Procedure: COLONOSCOPY WITH PROPOFOL;  Surgeon: Toledo, Boykin Nearing, MD;  Location: [facility];  Service: Endoscopy;  Laterality: N/A;   ESOPHAGOGASTRODUODENOSCOPY     ESOPHAGOGASTRODUODENOSCOPY (EGD) WITH PROPOFOL N/A 12/09/2016   Procedure:  ESOPHAGOGASTRODUODENOSCOPY (EGD) WITH PROPOFOL;  Surgeon: Toledo, Boykin Nearing, MD;  Location: [facility];  Service: Endoscopy;  Laterality: N/A;   KYPHOPLASTY N/A 08/10/2017   Procedure: Nicki Reaper;  Surgeon: Kennedy Bucker, MD;  Location: [facility];  Service: Orthopedics;  Laterality: N/A;   KYPHOPLASTY N/A 09/09/2017   Procedure: [id number]-J8,J1;  Surgeon: Kennedy Bucker, MD;  Location: [facility];  Service: Orthopedics;  Laterality: N/A;   KYPHOPLASTY N/A 09/28/2017   Procedure: [id number];  Surgeon: Kennedy Bucker, MD;  Location: [facility];  Service: Orthopedics;  Laterality: N/A;   KYPHOPLASTY N/A 06/19/2019   Procedure: KYPHOPLASTY T5;  Surgeon: Kennedy Bucker, MD;  Location: [facility];  Service: Orthopedics;  Laterality: N/A;   OOPHORECTOMY Bilateral 2003   RIGHT/LEFT HEART CATH AND CORONARY ANGIOGRAPHY Bilateral 10/19/2019   Procedure: RIGHT/LEFT HEART CATH AND CORONARY ANGIOGRAPHY;  Surgeon: Alwyn Pea, MD;  Location: [facility];  Service: Cardiovascular;  Laterality: Bilateral;    Family History  Problem Relation Age of Onset   Kidney disease Mother  Diabetes Mellitus II Brother     Social History:  reports that she quit smoking about 15 years ago. Her smoking use included cigarettes. She smoked an average of 1 pack per day. She has never used smokeless tobacco. She reports that she does not drink alcohol and does not use drugs. She quit smoking in 2009.  She lives in [place]. Her step daughter is Hospital doctor. The patient is accompanied by [facility] today.  Allergies:  Allergies  Allergen Reactions   Methotrexate Derivatives Nausea And Vomiting and Other (See Comments)    Flu like symptoms   Other Nausea Only and Other (See Comments)    Arthritis medications . Unsure of what the med was.   Ranitidine Hives   Sulfasalazine Other (See Comments)    Stomach upset   Topiramate Nausea Only    Current Medications: Current Outpatient Medications  Medication Sig  Dispense Refill   albuterol (VENTOLIN HFA) 108 (90 Base) MCG/ACT inhaler Inhale 2 puffs into the lungs every 6 (six) hours as needed for wheezing or shortness of breath.      budesonide-formoterol (SYMBICORT) 160-4.5 MCG/ACT inhaler Inhale 2 puffs into the lungs 2 (two) times daily.     Cholecalciferol (VITAMIN D3) 2000 UNITS capsule Take 2,000 Units by mouth daily.      cyanocobalamin (VITAMIN B12) 1000 MCG/ML injection INJECT INTO THE MUSCLE ONCE EVERY 30 DAYS 3 mL 0   cyclobenzaprine (FLEXERIL) 5 MG tablet Take 5 mg by mouth 2 (two) times daily.     diclofenac Sodium (VOLTAREN) 1 % GEL Apply 2 g topically as needed.     Fe Fum-FePoly-Vit C-Vit B3 (INTEGRA) 62.5-62.5-40-3 MG CAPS Take 1 capsule by mouth daily.     predniSONE (DELTASONE) 5 MG tablet Take 5 mg by mouth daily.      predniSONE (STERAPRED UNI-PAK 21 TAB) 10 MG (21) TBPK tablet Take by mouth daily. Take 6 tabs by mouth daily for 1 day, then 5 tabs for 1 day, then 4 tabs for 1 day, then 3 tabs for 1 day, then 2 tabs for 1 day, then 1 tab by mouth daily for 1 day 21 tablet 0   Turmeric (QC TUMERIC COMPLEX) 500 MG CAPS Take 1 tablet by mouth daily.     No current facility-administered medications for this visit.      Performance status (ECOG): 1  Vitals Blood pressure 117/61, pulse (!) 110, temperature (!) 97.2 F (36.2 C), temperature source Tympanic, resp. rate 18, weight 127 lb 3.2 oz (57.7 kg), SpO2 95 %.   Physical Exam Vitals reviewed.  Constitutional:      General: She is not in acute distress.    Appearance: She is well-developed. She is not diaphoretic.     Comments: Patient sits in the wheelchair  Eyes:     General: No scleral icterus. Cardiovascular:     Rate and Rhythm: Normal rate.  Pulmonary:     Effort: Pulmonary effort is normal. No respiratory distress.  Abdominal:     General: There is no distension.     Palpations: Abdomen is soft.  Musculoskeletal:        General: Normal range of motion.      Cervical back: Normal range of motion and neck supple.  Lymphadenopathy:     Cervical: No cervical adenopathy.     Upper Body:     Right upper body: No supraclavicular adenopathy.     Left upper body: No supraclavicular adenopathy.  Skin:    General: Skin  is warm and dry.     Findings: Bruising present.  Neurological:     Mental Status: She is alert. Mental status is at baseline.  Psychiatric:        Mood and Affect: Mood normal.

## 2022-07-30 NOTE — Assessment & Plan Note (Signed)
Vitamin B12 deficiency, patient is on vitamin B12  injection monthly.  Patient prefers to have her chiropractor admitting B12 for her. 

## 2022-07-30 NOTE — Assessment & Plan Note (Signed)
Thrombocytosis, reactive versus reactive underlying bone marrow disorders. 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

## 2022-08-03 LAB — CALR +MPL + E12-E15  (REFLEX)

## 2022-08-03 LAB — JAK2 V617F RFX CALR/MPL/E12-15

## 2022-08-05 MED FILL — Iron Sucrose Inj 20 MG/ML (Fe Equiv): INTRAVENOUS | Qty: 10 | Status: AC

## 2022-08-06 ENCOUNTER — Inpatient Hospital Stay: Payer: Medicare Other

## 2022-08-06 VITALS — BP 118/59 | HR 100 | Temp 97.9°F | Resp 16

## 2022-08-06 DIAGNOSIS — E538 Deficiency of other specified B group vitamins: Secondary | ICD-10-CM

## 2022-08-06 DIAGNOSIS — D509 Iron deficiency anemia, unspecified: Secondary | ICD-10-CM | POA: Diagnosis not present

## 2022-08-06 MED ORDER — SODIUM CHLORIDE 0.9 % IV SOLN
200.0000 mg | Freq: Once | INTRAVENOUS | Status: AC
  Administered 2022-08-06: 200 mg via INTRAVENOUS
  Filled 2022-08-06: qty 200

## 2022-08-06 NOTE — Patient Instructions (Signed)

## 2022-08-06 NOTE — Progress Notes (Signed)
Declined 30 minute post-observation. Vitals stable at discharge.  

## 2022-08-12 MED FILL — Iron Sucrose Inj 20 MG/ML (Fe Equiv): INTRAVENOUS | Qty: 10 | Status: AC

## 2022-08-13 ENCOUNTER — Inpatient Hospital Stay: Payer: Medicare Other

## 2022-08-13 VITALS — BP 132/74 | HR 116 | Temp 99.1°F | Resp 16

## 2022-08-13 DIAGNOSIS — D509 Iron deficiency anemia, unspecified: Secondary | ICD-10-CM | POA: Diagnosis not present

## 2022-08-13 DIAGNOSIS — E538 Deficiency of other specified B group vitamins: Secondary | ICD-10-CM

## 2022-08-13 MED ORDER — SODIUM CHLORIDE 0.9 % IV SOLN
200.0000 mg | Freq: Once | INTRAVENOUS | Status: AC
  Administered 2022-08-13: 200 mg via INTRAVENOUS
  Filled 2022-08-13: qty 200

## 2022-08-13 MED ORDER — SODIUM CHLORIDE 0.9 % IV SOLN
Freq: Once | INTRAVENOUS | Status: AC
  Filled 2022-08-13: qty 250

## 2022-08-13 NOTE — Progress Notes (Signed)
Pt refused 30 minute observation period post venofer.  Pt tolerated infusion well and left the office stable in a wheelchair.

## 2022-08-13 NOTE — Patient Instructions (Signed)

## 2022-08-19 MED FILL — Iron Sucrose Inj 20 MG/ML (Fe Equiv): INTRAVENOUS | Qty: 10 | Status: AC

## 2022-08-20 ENCOUNTER — Inpatient Hospital Stay: Payer: Medicare Other | Attending: Oncology

## 2022-08-20 VITALS — BP 139/70 | HR 101 | Temp 99.5°F | Resp 16

## 2022-08-20 DIAGNOSIS — R5383 Other fatigue: Secondary | ICD-10-CM | POA: Diagnosis not present

## 2022-08-20 DIAGNOSIS — Z87891 Personal history of nicotine dependence: Secondary | ICD-10-CM | POA: Insufficient documentation

## 2022-08-20 DIAGNOSIS — Z7952 Long term (current) use of systemic steroids: Secondary | ICD-10-CM | POA: Diagnosis not present

## 2022-08-20 DIAGNOSIS — D72829 Elevated white blood cell count, unspecified: Secondary | ICD-10-CM | POA: Insufficient documentation

## 2022-08-20 DIAGNOSIS — M069 Rheumatoid arthritis, unspecified: Secondary | ICD-10-CM | POA: Insufficient documentation

## 2022-08-20 DIAGNOSIS — D75839 Thrombocytosis, unspecified: Secondary | ICD-10-CM | POA: Insufficient documentation

## 2022-08-20 DIAGNOSIS — D509 Iron deficiency anemia, unspecified: Secondary | ICD-10-CM | POA: Insufficient documentation

## 2022-08-20 DIAGNOSIS — E538 Deficiency of other specified B group vitamins: Secondary | ICD-10-CM | POA: Insufficient documentation

## 2022-08-20 MED ORDER — SODIUM CHLORIDE 0.9 % IV SOLN
200.0000 mg | Freq: Once | INTRAVENOUS | Status: AC
  Administered 2022-08-20: 200 mg via INTRAVENOUS
  Filled 2022-08-20: qty 200

## 2022-08-20 MED ORDER — SODIUM CHLORIDE 0.9 % IV SOLN
Freq: Once | INTRAVENOUS | Status: AC
  Filled 2022-08-20: qty 250

## 2022-09-20 ENCOUNTER — Other Ambulatory Visit: Payer: Self-pay | Admitting: Oncology

## 2022-10-01 ENCOUNTER — Encounter: Payer: Self-pay | Admitting: Hematology and Oncology

## 2022-10-01 ENCOUNTER — Other Ambulatory Visit: Payer: Self-pay

## 2022-11-19 ENCOUNTER — Inpatient Hospital Stay: Payer: Medicare Other | Attending: Oncology

## 2022-11-19 DIAGNOSIS — R5383 Other fatigue: Secondary | ICD-10-CM | POA: Diagnosis not present

## 2022-11-19 DIAGNOSIS — E538 Deficiency of other specified B group vitamins: Secondary | ICD-10-CM | POA: Diagnosis present

## 2022-11-19 DIAGNOSIS — D509 Iron deficiency anemia, unspecified: Secondary | ICD-10-CM | POA: Insufficient documentation

## 2022-11-19 DIAGNOSIS — M069 Rheumatoid arthritis, unspecified: Secondary | ICD-10-CM | POA: Diagnosis not present

## 2022-11-19 DIAGNOSIS — D75839 Thrombocytosis, unspecified: Secondary | ICD-10-CM | POA: Insufficient documentation

## 2022-11-19 DIAGNOSIS — Z87891 Personal history of nicotine dependence: Secondary | ICD-10-CM | POA: Insufficient documentation

## 2022-11-19 LAB — CBC WITH DIFFERENTIAL (CANCER CENTER ONLY)
Abs Immature Granulocytes: 0.12 10*3/uL — ABNORMAL HIGH (ref 0.00–0.07)
Basophils Absolute: 0 10*3/uL (ref 0.0–0.1)
Basophils Relative: 0 %
Eosinophils Absolute: 0 10*3/uL (ref 0.0–0.5)
Eosinophils Relative: 0 %
HCT: 26 % — ABNORMAL LOW (ref 36.0–46.0)
Hemoglobin: 8 g/dL — ABNORMAL LOW (ref 12.0–15.0)
Immature Granulocytes: 1 %
Lymphocytes Relative: 5 %
Lymphs Abs: 0.6 10*3/uL — ABNORMAL LOW (ref 0.7–4.0)
MCH: 27.5 pg (ref 26.0–34.0)
MCHC: 30.8 g/dL (ref 30.0–36.0)
MCV: 89.3 fL (ref 80.0–100.0)
Monocytes Absolute: 0.5 10*3/uL (ref 0.1–1.0)
Monocytes Relative: 3 %
Neutro Abs: 12.7 10*3/uL — ABNORMAL HIGH (ref 1.7–7.7)
Neutrophils Relative %: 91 %
Platelet Count: 400 10*3/uL (ref 150–400)
RBC: 2.91 MIL/uL — ABNORMAL LOW (ref 3.87–5.11)
RDW: 16 % — ABNORMAL HIGH (ref 11.5–15.5)
WBC Count: 14 10*3/uL — ABNORMAL HIGH (ref 4.0–10.5)
nRBC: 0 % (ref 0.0–0.2)

## 2022-11-19 LAB — IRON AND TIBC
Iron: 213 ug/dL — ABNORMAL HIGH (ref 28–170)
Saturation Ratios: 58 % — ABNORMAL HIGH (ref 10.4–31.8)
TIBC: 370 ug/dL (ref 250–450)
UIBC: 157 ug/dL

## 2022-11-19 LAB — RETIC PANEL
Immature Retic Fract: 17.4 % — ABNORMAL HIGH (ref 2.3–15.9)
RBC.: 2.96 MIL/uL — ABNORMAL LOW (ref 3.87–5.11)
Retic Count, Absolute: 69.3 10*3/uL (ref 19.0–186.0)
Retic Ct Pct: 2.3 % (ref 0.4–3.1)
Reticulocyte Hemoglobin: 25.6 pg — ABNORMAL LOW (ref >27.9)

## 2022-11-19 LAB — FERRITIN: Ferritin: 14 ng/mL (ref 11–307)

## 2022-11-26 ENCOUNTER — Inpatient Hospital Stay: Payer: Medicare Other

## 2022-11-26 ENCOUNTER — Inpatient Hospital Stay (HOSPITAL_BASED_OUTPATIENT_CLINIC_OR_DEPARTMENT_OTHER): Payer: Medicare Other | Admitting: Oncology

## 2022-11-26 ENCOUNTER — Encounter: Payer: Self-pay | Admitting: Oncology

## 2022-11-26 VITALS — BP 143/59 | HR 105 | Resp 20

## 2022-11-26 VITALS — BP 125/83 | HR 117 | Temp 98.9°F | Resp 18 | Wt 126.1 lb

## 2022-11-26 DIAGNOSIS — E538 Deficiency of other specified B group vitamins: Secondary | ICD-10-CM

## 2022-11-26 DIAGNOSIS — D509 Iron deficiency anemia, unspecified: Secondary | ICD-10-CM

## 2022-11-26 DIAGNOSIS — D72828 Other elevated white blood cell count: Secondary | ICD-10-CM | POA: Diagnosis not present

## 2022-11-26 MED ORDER — SODIUM CHLORIDE 0.9 % IV SOLN
Freq: Once | INTRAVENOUS | Status: AC
  Filled 2022-11-26: qty 250

## 2022-11-26 MED ORDER — SODIUM CHLORIDE 0.9 % IV SOLN
200.0000 mg | Freq: Once | INTRAVENOUS | Status: AC
  Administered 2022-11-26: 200 mg via INTRAVENOUS
  Filled 2022-11-26: qty 200

## 2022-11-26 NOTE — Patient Instructions (Signed)
Iron Sucrose Injection What is this medication? IRON SUCROSE (EYE ern SOO krose) treats low levels of iron (iron deficiency anemia) in people with kidney disease. Iron is a mineral that plays an important role in making red blood cells, which carry oxygen from your lungs to the rest of your body. This medicine may be used for other purposes; ask your health care provider or pharmacist if you have questions. COMMON BRAND NAME(S): Venofer What should I tell my care team before I take this medication? They need to know if you have any of these conditions: Anemia not caused by low iron levels Heart disease High levels of iron in the blood Kidney disease Liver disease An unusual or allergic reaction to iron, other medications, foods, dyes, or preservatives Pregnant or trying to get pregnant Breastfeeding How should I use this medication? This medication is for infusion into a vein. It is given in a hospital or clinic setting. Talk to your care team about the use of this medication in children. While this medication may be prescribed for children as young as 2 years for selected conditions, precautions do apply. Overdosage: If you think you have taken too much of this medicine contact a poison control center or emergency room at once. NOTE: This medicine is only for you. Do not share this medicine with others. What if I miss a dose? Keep appointments for follow-up doses. It is important not to miss your dose. Call your care team if you are unable to keep an appointment. What may interact with this medication? Do not take this medication with any of the following: Deferoxamine Dimercaprol Other iron products This medication may also interact with the following: Chloramphenicol Deferasirox This list may not describe all possible interactions. Give your health care provider a list of all the medicines, herbs, non-prescription drugs, or dietary supplements you use. Also tell them if you smoke,  drink alcohol, or use illegal drugs. Some items may interact with your medicine. What should I watch for while using this medication? Visit your care team regularly. Tell your care team if your symptoms do not start to get better or if they get worse. You may need blood work done while you are taking this medication. You may need to follow a special diet. Talk to your care team. Foods that contain iron include: whole grains/cereals, dried fruits, beans, or peas, leafy green vegetables, and organ meats (liver, kidney). What side effects may I notice from receiving this medication? Side effects that you should report to your care team as soon as possible: Allergic reactions--skin rash, itching, hives, swelling of the face, lips, tongue, or throat Low blood pressure--dizziness, feeling faint or lightheaded, blurry vision Shortness of breath Side effects that usually do not require medical attention (report to your care team if they continue or are bothersome): Flushing Headache Joint pain Muscle pain Nausea Pain, redness, or irritation at injection site This list may not describe all possible side effects. Call your doctor for medical advice about side effects. You may report side effects to FDA at 1-800-FDA-1088. Where should I keep my medication? This medication is given in a hospital or clinic. It will not be stored at home. NOTE: This sheet is a summary. It may not cover all possible information. If you have questions about this medicine, talk to your doctor, pharmacist, or health care provider.  2024 Elsevier/Gold Standard (2022-08-07 00:00:00)

## 2022-11-26 NOTE — Progress Notes (Signed)
Pt declined 30 min post venofer observation period.  Pt tolerated infusion well and left the infusion suite stable in a wheelchair.

## 2022-11-26 NOTE — Assessment & Plan Note (Signed)
Possible secondary to steroid use.

## 2022-11-26 NOTE — Assessment & Plan Note (Signed)
Vitamin B12 deficiency, patient is on vitamin B12  injection monthly.  Patient prefers to have her chiropractor admitting B12 for her. 

## 2022-11-26 NOTE — Assessment & Plan Note (Addendum)
#  Recurrent chronic iron deficiency anemia, status post IV Venofer treatments. Labs reviewed and discussed with patient. Lab Results  Component Value Date   HGB 8.0 (L) 11/19/2022   TIBC 370 11/19/2022   IRONPCTSAT 58 (H) 11/19/2022   FERRITIN 14 11/19/2022    Persistent iron deficiency with low hemoglobin, lowe ferritin and low retic hemoglobin.  I recommend IV Venofer weekly x 4.  Etiology of iron deficiency, I suspect that she has ongoing GI blood loss.  I have urged patient to make follow-up appointment with GI Previously her hemoglobin did not improve with better iron panel.  There may be underlying bone marrow disorders. I have previously recommended bone marrow biopsy.  she declined.

## 2022-11-26 NOTE — Progress Notes (Signed)
Hematology oncology progress note   ASSESSMENT & PLAN:   Iron deficiency anemia #Recurrent chronic iron deficiency anemia, status post IV Venofer treatments. Labs reviewed and discussed with patient. Lab Results  Component Value Date   HGB 8.0 (L) 11/19/2022   TIBC 370 11/19/2022   IRONPCTSAT 58 (H) 11/19/2022   FERRITIN 14 11/19/2022    Persistent iron deficiency with low hemoglobin, lowe ferritin and low retic hemoglobin.  I recommend IV Venofer weekly x 4.  Etiology of iron deficiency, I suspect that she has ongoing GI blood loss.  I have urged patient to make follow-up appointment with GI Previously her hemoglobin did not improve with better iron panel.  There may be underlying bone marrow disorders. I have previously recommended bone marrow biopsy.  she declined.  B12 deficiency Vitamin B12 deficiency, patient is on vitamin B12  injection monthly.  Patient prefers to have her chiropractor admitting B12 for her.  Leukocytosis Possible secondary to steroid use.   Orders Placed This Encounter  Procedures   CBC with Differential (Cancer Center Only)    Standing Status:   Future    Standing Expiration Date:   11/26/2023   Iron and TIBC    Standing Status:   Future    Standing Expiration Date:   11/26/2023   Ferritin    Standing Status:   Future    Standing Expiration Date:   11/26/2023   Retic Panel    Standing Status:   Future    Standing Expiration Date:   11/26/2023   CBC with Differential (Cancer Center Only)    Standing Status:   Future    Standing Expiration Date:   11/26/2023   Iron and TIBC    Standing Status:   Future    Standing Expiration Date:   11/26/2023   Ferritin    Standing Status:   Future    Standing Expiration Date:   11/26/2023   Retic Panel    Standing Status:   Future    Standing Expiration Date:   11/26/2023    Follow up in 3 months.   All questions were answered. The patient knows to call the clinic with any problems, questions or  concerns.  Rickard Patience, MD, PhD [facility] Hematology Oncology 11/26/2022    Chief Complaint: Ashley Mack is a 77 y.o. female with iron deficiency anemia and B12 deficiency  presents to follow-up.    INTERVAL HISTORY Ashley Mack is a 77 y.o. female who has above history reviewed by me today presents for follow up visit for management of anemia and B12 deficiency Problems and complaints are listed below: Patient previously followed up by Dr.Corcoran, patient switched care to me on 08/15/20 Extensive medical record review was performed by me  Chronic recurrent iron deficiency anemia   She had a colonoscopy on 03/18/2009. She has had several EGDs with the last on 10/12/2008.  She states that she has had polyps, reflux and a hiatal hernia.EGD on 12/09/2016 was normal, other than demonstrating a small hiatal hernia. Colonoscopy on 12/09/2016 demonstrated friability with contact bleeding in the proximal transverse colon. A 1 mm sessile polyp was found in the sigmoid colon, in addition to grade II non-bleeding internal hemorrhoids were also noted. Pathology results demonstrated a tubular adenoma that was negative for dyplasia or malignancy. 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   Patient has had a multiple IV Venofer treatments. receives Venofer if her ferritin is < 30.   Vitamin B12 deficiency, She began weekly  B12 on 09/08/2016 and continued x 6 weeks (last 10/16/2016). She began monthly B12 on 11/26/2016 (last 02/17/2018).  Patient used to get vitamin B12 prescription from Dr. Merlene Pulling and her chiropractor injects for her.   She has rheumatoid arthritis.  She has been treated with methotrexate, Imuran, leflunomide, and sulfasalazine.  She is on prednisone 5 mg a day x 2 years.  She is s/p kyphoplasty x 5 since 04/2017.    INTERVAL HISTORY Ashley Mack is a 77 y.o. female who has above history reviewed by me today presents for follow up visit for management of vitamin B12 deficiency and  iron deficiency anemia.  S/p IV multiple doses of IV Venofer treatments.. She feels fatigue. Denies any bloody or black stool.    Review of Systems  Constitutional:  Positive for fatigue. Negative for appetite change and unexpected weight change.  HENT:   Negative for mouth sores.   Respiratory:  Negative for shortness of breath.   Cardiovascular:  Negative for chest pain.  Gastrointestinal:  Negative for abdominal pain and blood in stool.  Genitourinary:  Negative for dysuria.   Musculoskeletal:  Positive for arthralgias and back pain.  Neurological:  Negative for headaches.  Psychiatric/Behavioral:  Negative for confusion.      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)    Iron (Fe) deficiency anemia    Migraine headache    in past   Migraines    Motion sickness    No natural teeth    Osteoporosis    Rheumatoid arthritis (HCC)    Vertigo     Past Surgical History:  Procedure Laterality Date   CATARACT EXTRACTION W/PHACO Right 08/12/2016   Procedure: CATARACT EXTRACTION PHACO AND INTRAOCULAR LENS PLACEMENT (IOC)  Right;  Surgeon: Lockie Mola, MD;  Location: [facility];  Service: Ophthalmology;  Laterality: Right;   CATARACT EXTRACTION W/PHACO Left 09/30/2016   Procedure: CATARACT EXTRACTION PHACO AND INTRAOCULAR LENS PLACEMENT (IOC) Left;  Surgeon: Lockie Mola, MD;  Location: [facility];  Service: Ophthalmology;  Laterality: Left;   COLONOSCOPY     COLONOSCOPY WITH PROPOFOL N/A 12/09/2016   Procedure: COLONOSCOPY WITH PROPOFOL;  Surgeon: Toledo, Boykin Nearing, MD;  Location: [facility];  Service: Endoscopy;   Laterality: N/A;   ESOPHAGOGASTRODUODENOSCOPY     ESOPHAGOGASTRODUODENOSCOPY (EGD) WITH PROPOFOL N/A 12/09/2016   Procedure: ESOPHAGOGASTRODUODENOSCOPY (EGD) WITH PROPOFOL;  Surgeon: Toledo, Boykin Nearing, MD;  Location: [facility];  Service: Endoscopy;  Laterality: N/A;   KYPHOPLASTY N/A 08/10/2017   Procedure: Nicki Reaper;  Surgeon: Kennedy Bucker, MD;  Location: [facility];  Service: Orthopedics;  Laterality: N/A;   KYPHOPLASTY N/A 09/09/2017   Procedure: [id number]-J8,J1;  Surgeon: Kennedy Bucker, MD;  Location: [facility];  Service: Orthopedics;  Laterality: N/A;   KYPHOPLASTY N/A 09/28/2017   Procedure: [id number];  Surgeon: Kennedy Bucker, MD;  Location: [facility];  Service: Orthopedics;  Laterality: N/A;   KYPHOPLASTY N/A 06/19/2019   Procedure: KYPHOPLASTY T5;  Surgeon: Kennedy Bucker, MD;  Location: [facility];  Service: Orthopedics;  Laterality: N/A;   OOPHORECTOMY Bilateral 2003  RIGHT/LEFT HEART CATH AND CORONARY ANGIOGRAPHY Bilateral 10/19/2019   Procedure: RIGHT/LEFT HEART CATH AND CORONARY ANGIOGRAPHY;  Surgeon: Alwyn Pea, MD;  Location: [facility];  Service: Cardiovascular;  Laterality: Bilateral;    Family History  Problem Relation Age of Onset   Kidney disease Mother    Diabetes Mellitus II Brother     Social History:  reports that she quit smoking about 15 years ago. Her smoking use included cigarettes. She has never used smokeless tobacco. She reports that she does not drink alcohol and does not use drugs. She quit smoking in 2009.  She lives in [place]. Her step daughter is Hospital doctor. The patient is accompanied by [facility] today.  Allergies:  Allergies  Allergen Reactions   Methotrexate Derivatives Nausea And Vomiting and Other (See Comments)    Flu like symptoms   Other Nausea Only and Other (See Comments)    Arthritis medications . Unsure of what the med was.   Ranitidine Hives   Sulfasalazine Other (See Comments)    Stomach upset   Topiramate Nausea  Only    Current Medications: Current Outpatient Medications  Medication Sig Dispense Refill   albuterol (VENTOLIN HFA) 108 (90 Base) MCG/ACT inhaler Inhale 2 puffs into the lungs every 6 (six) hours as needed for wheezing or shortness of breath.      budesonide-formoterol (SYMBICORT) 160-4.5 MCG/ACT inhaler Inhale 2 puffs into the lungs 2 (two) times daily.     Cholecalciferol (VITAMIN D3) 2000 UNITS capsule Take 2,000 Units by mouth daily.      cyanocobalamin (VITAMIN B12) 1000 MCG/ML injection INJECT 1 ML INTO THE MUSCLE ONCE EVERY 30 DAYS 3 mL 0   cyclobenzaprine (FLEXERIL) 5 MG tablet Take 5 mg by mouth 2 (two) times daily.     dexlansoprazole (DEXILANT) 60 MG capsule Take 60 mg by mouth daily.     diclofenac Sodium (VOLTAREN) 1 % GEL Apply 2 g topically as needed.     Fe Fum-FePoly-Vit C-Vit B3 (INTEGRA) 62.5-62.5-40-3 MG CAPS Take 1 capsule by mouth daily.     fluticasone (FLONASE) 50 MCG/ACT nasal spray Place into both nostrils daily.     predniSONE (DELTASONE) 5 MG tablet Take 5 mg by mouth daily.      Turmeric (QC TUMERIC COMPLEX) 500 MG CAPS Take 1 tablet by mouth daily.     No current facility-administered medications for this visit.      Performance status (ECOG): 1  Vitals Blood pressure 125/83, pulse (!) 117, temperature 98.9 F (37.2 C), resp. rate 18, weight 126 lb 1.6 oz (57.2 kg).   Physical Exam Vitals reviewed.  Constitutional:      General: She is not in acute distress.    Appearance: She is well-developed. She is not diaphoretic.     Comments: Patient sits in the wheelchair  Eyes:     General: No scleral icterus. Cardiovascular:     Rate and Rhythm: Normal rate.  Pulmonary:     Effort: Pulmonary effort is normal. No respiratory distress.  Abdominal:     General: There is no distension.     Palpations: Abdomen is soft.  Musculoskeletal:        General: Normal range of motion.     Cervical back: Normal range of motion and neck supple.   Lymphadenopathy:     Cervical: No cervical adenopathy.     Upper Body:     Right upper body: No supraclavicular adenopathy.     Left upper body: No supraclavicular  adenopathy.  Skin:    General: Skin is warm and dry.     Findings: Bruising present.  Neurological:     Mental Status: She is alert. Mental status is at baseline.  Psychiatric:        Mood and Affect: Mood normal.

## 2022-12-03 ENCOUNTER — Inpatient Hospital Stay: Payer: Medicare Other

## 2022-12-03 VITALS — BP 132/78 | HR 70 | Resp 20

## 2022-12-03 DIAGNOSIS — D509 Iron deficiency anemia, unspecified: Secondary | ICD-10-CM | POA: Diagnosis not present

## 2022-12-03 DIAGNOSIS — E538 Deficiency of other specified B group vitamins: Secondary | ICD-10-CM

## 2022-12-03 MED ORDER — SODIUM CHLORIDE 0.9 % IV SOLN
Freq: Once | INTRAVENOUS | Status: AC
  Filled 2022-12-03: qty 250

## 2022-12-03 MED ORDER — SODIUM CHLORIDE 0.9 % IV SOLN
200.0000 mg | Freq: Once | INTRAVENOUS | Status: AC
  Administered 2022-12-03: 200 mg via INTRAVENOUS
  Filled 2022-12-03: qty 200

## 2022-12-03 NOTE — Patient Instructions (Signed)
Iron Sucrose Injection What is this medication? IRON SUCROSE (EYE ern SOO krose) treats low levels of iron (iron deficiency anemia) in people with kidney disease. Iron is a mineral that plays an important role in making red blood cells, which carry oxygen from your lungs to the rest of your body. This medicine may be used for other purposes; ask your health care provider or pharmacist if you have questions. COMMON BRAND NAME(S): Venofer What should I tell my care team before I take this medication? They need to know if you have any of these conditions: Anemia not caused by low iron levels Heart disease High levels of iron in the blood Kidney disease Liver disease An unusual or allergic reaction to iron, other medications, foods, dyes, or preservatives Pregnant or trying to get pregnant Breastfeeding How should I use this medication? This medication is for infusion into a vein. It is given in a hospital or clinic setting. Talk to your care team about the use of this medication in children. While this medication may be prescribed for children as young as 2 years for selected conditions, precautions do apply. Overdosage: If you think you have taken too much of this medicine contact a poison control center or emergency room at once. NOTE: This medicine is only for you. Do not share this medicine with others. What if I miss a dose? Keep appointments for follow-up doses. It is important not to miss your dose. Call your care team if you are unable to keep an appointment. What may interact with this medication? Do not take this medication with any of the following: Deferoxamine Dimercaprol Other iron products This medication may also interact with the following: Chloramphenicol Deferasirox This list may not describe all possible interactions. Give your health care provider a list of all the medicines, herbs, non-prescription drugs, or dietary supplements you use. Also tell them if you smoke,  drink alcohol, or use illegal drugs. Some items may interact with your medicine. What should I watch for while using this medication? Visit your care team regularly. Tell your care team if your symptoms do not start to get better or if they get worse. You may need blood work done while you are taking this medication. You may need to follow a special diet. Talk to your care team. Foods that contain iron include: whole grains/cereals, dried fruits, beans, or peas, leafy green vegetables, and organ meats (liver, kidney). What side effects may I notice from receiving this medication? Side effects that you should report to your care team as soon as possible: Allergic reactions--skin rash, itching, hives, swelling of the face, lips, tongue, or throat Low blood pressure--dizziness, feeling faint or lightheaded, blurry vision Shortness of breath Side effects that usually do not require medical attention (report to your care team if they continue or are bothersome): Flushing Headache Joint pain Muscle pain Nausea Pain, redness, or irritation at injection site This list may not describe all possible side effects. Call your doctor for medical advice about side effects. You may report side effects to FDA at 1-800-FDA-1088. Where should I keep my medication? This medication is given in a hospital or clinic. It will not be stored at home. NOTE: This sheet is a summary. It may not cover all possible information. If you have questions about this medicine, talk to your doctor, pharmacist, or health care provider.  2024 Elsevier/Gold Standard (2022-08-07 00:00:00)

## 2022-12-10 ENCOUNTER — Inpatient Hospital Stay: Payer: Medicare Other

## 2022-12-10 VITALS — BP 146/76 | HR 99 | Temp 98.7°F | Resp 20

## 2022-12-10 DIAGNOSIS — D509 Iron deficiency anemia, unspecified: Secondary | ICD-10-CM

## 2022-12-10 DIAGNOSIS — E538 Deficiency of other specified B group vitamins: Secondary | ICD-10-CM

## 2022-12-10 MED ORDER — SODIUM CHLORIDE 0.9 % IV SOLN
200.0000 mg | Freq: Once | INTRAVENOUS | Status: AC
  Administered 2022-12-10: 200 mg via INTRAVENOUS
  Filled 2022-12-10: qty 200

## 2022-12-10 MED ORDER — SODIUM CHLORIDE 0.9 % IV SOLN
INTRAVENOUS | Status: DC
  Filled 2022-12-10: qty 250

## 2022-12-10 NOTE — Progress Notes (Signed)
Patient declined to wait the 30 minutes for post iron infusion observation today. Tolerated infusion well. VSS.

## 2022-12-12 ENCOUNTER — Other Ambulatory Visit: Payer: Self-pay | Admitting: Oncology

## 2022-12-14 ENCOUNTER — Encounter: Payer: Self-pay | Admitting: Hematology and Oncology

## 2022-12-15 ENCOUNTER — Ambulatory Visit: Payer: Medicare Other | Admitting: Physician Assistant

## 2022-12-17 ENCOUNTER — Inpatient Hospital Stay: Payer: Medicare Other

## 2022-12-17 ENCOUNTER — Inpatient Hospital Stay: Payer: Medicare Other | Attending: Oncology

## 2022-12-17 VITALS — BP 106/68 | HR 82 | Temp 98.2°F | Resp 22

## 2022-12-17 DIAGNOSIS — D509 Iron deficiency anemia, unspecified: Secondary | ICD-10-CM | POA: Diagnosis present

## 2022-12-17 DIAGNOSIS — E538 Deficiency of other specified B group vitamins: Secondary | ICD-10-CM | POA: Diagnosis present

## 2022-12-17 MED ORDER — SODIUM CHLORIDE 0.9 % IV SOLN
INTRAVENOUS | Status: DC
  Filled 2022-12-17: qty 250

## 2022-12-17 MED ORDER — SODIUM CHLORIDE 0.9 % IV SOLN
200.0000 mg | Freq: Once | INTRAVENOUS | Status: AC
  Administered 2022-12-17: 200 mg via INTRAVENOUS
  Filled 2022-12-17: qty 200

## 2022-12-17 NOTE — Patient Instructions (Signed)
Iron Sucrose Injection What is this medication? IRON SUCROSE (EYE ern SOO krose) treats low levels of iron (iron deficiency anemia) in people with kidney disease. Iron is a mineral that plays an important role in making red blood cells, which carry oxygen from your lungs to the rest of your body. This medicine may be used for other purposes; ask your health care provider or pharmacist if you have questions. COMMON BRAND NAME(S): Venofer What should I tell my care team before I take this medication? They need to know if you have any of these conditions: Anemia not caused by low iron levels Heart disease High levels of iron in the blood Kidney disease Liver disease An unusual or allergic reaction to iron, other medications, foods, dyes, or preservatives Pregnant or trying to get pregnant Breastfeeding How should I use this medication? This medication is for infusion into a vein. It is given in a hospital or clinic setting. Talk to your care team about the use of this medication in children. While this medication may be prescribed for children as young as 2 years for selected conditions, precautions do apply. Overdosage: If you think you have taken too much of this medicine contact a poison control center or emergency room at once. NOTE: This medicine is only for you. Do not share this medicine with others. What if I miss a dose? Keep appointments for follow-up doses. It is important not to miss your dose. Call your care team if you are unable to keep an appointment. What may interact with this medication? Do not take this medication with any of the following: Deferoxamine Dimercaprol Other iron products This medication may also interact with the following: Chloramphenicol Deferasirox This list may not describe all possible interactions. Give your health care provider a list of all the medicines, herbs, non-prescription drugs, or dietary supplements you use. Also tell them if you smoke,  drink alcohol, or use illegal drugs. Some items may interact with your medicine. What should I watch for while using this medication? Visit your care team regularly. Tell your care team if your symptoms do not start to get better or if they get worse. You may need blood work done while you are taking this medication. You may need to follow a special diet. Talk to your care team. Foods that contain iron include: whole grains/cereals, dried fruits, beans, or peas, leafy green vegetables, and organ meats (liver, kidney). What side effects may I notice from receiving this medication? Side effects that you should report to your care team as soon as possible: Allergic reactions--skin rash, itching, hives, swelling of the face, lips, tongue, or throat Low blood pressure--dizziness, feeling faint or lightheaded, blurry vision Shortness of breath Side effects that usually do not require medical attention (report to your care team if they continue or are bothersome): Flushing Headache Joint pain Muscle pain Nausea Pain, redness, or irritation at injection site This list may not describe all possible side effects. Call your doctor for medical advice about side effects. You may report side effects to FDA at 1-800-FDA-1088. Where should I keep my medication? This medication is given in a hospital or clinic. It will not be stored at home. NOTE: This sheet is a summary. It may not cover all possible information. If you have questions about this medicine, talk to your doctor, pharmacist, or health care provider.  2024 Elsevier/Gold Standard (2022-08-07 00:00:00)

## 2023-02-23 ENCOUNTER — Other Ambulatory Visit: Payer: Medicare Other | Attending: Oncology

## 2023-02-23 DIAGNOSIS — M069 Rheumatoid arthritis, unspecified: Secondary | ICD-10-CM | POA: Diagnosis not present

## 2023-02-23 DIAGNOSIS — Z87891 Personal history of nicotine dependence: Secondary | ICD-10-CM | POA: Insufficient documentation

## 2023-02-23 DIAGNOSIS — R5383 Other fatigue: Secondary | ICD-10-CM | POA: Insufficient documentation

## 2023-02-23 DIAGNOSIS — E538 Deficiency of other specified B group vitamins: Secondary | ICD-10-CM | POA: Insufficient documentation

## 2023-02-23 DIAGNOSIS — D509 Iron deficiency anemia, unspecified: Secondary | ICD-10-CM | POA: Diagnosis present

## 2023-02-23 LAB — CBC WITH DIFFERENTIAL (CANCER CENTER ONLY)
Abs Immature Granulocytes: 0.25 10*3/uL — ABNORMAL HIGH (ref 0.00–0.07)
Basophils Absolute: 0.1 10*3/uL (ref 0.0–0.1)
Basophils Relative: 0 %
Eosinophils Absolute: 0 10*3/uL (ref 0.0–0.5)
Eosinophils Relative: 0 %
HCT: 34.7 % — ABNORMAL LOW (ref 36.0–46.0)
Hemoglobin: 10.8 g/dL — ABNORMAL LOW (ref 12.0–15.0)
Immature Granulocytes: 2 %
Lymphocytes Relative: 8 %
Lymphs Abs: 1 10*3/uL (ref 0.7–4.0)
MCH: 28.1 pg (ref 26.0–34.0)
MCHC: 31.1 g/dL (ref 30.0–36.0)
MCV: 90.1 fL (ref 80.0–100.0)
Monocytes Absolute: 0.4 10*3/uL (ref 0.1–1.0)
Monocytes Relative: 3 %
Neutro Abs: 11.8 10*3/uL — ABNORMAL HIGH (ref 1.7–7.7)
Neutrophils Relative %: 87 %
Platelet Count: 432 10*3/uL — ABNORMAL HIGH (ref 150–400)
RBC: 3.85 MIL/uL — ABNORMAL LOW (ref 3.87–5.11)
RDW: 15.7 % — ABNORMAL HIGH (ref 11.5–15.5)
WBC Count: 13.5 10*3/uL — ABNORMAL HIGH (ref 4.0–10.5)
nRBC: 0 % (ref 0.0–0.2)

## 2023-02-23 LAB — RETIC PANEL
Immature Retic Fract: 14.1 % (ref 2.3–15.9)
RBC.: 3.81 MIL/uL — ABNORMAL LOW (ref 3.87–5.11)
Retic Count, Absolute: 67.1 10*3/uL (ref 19.0–186.0)
Retic Ct Pct: 1.8 % (ref 0.4–3.1)
Reticulocyte Hemoglobin: 28.2 pg (ref >27.9)

## 2023-02-23 LAB — IRON AND TIBC
Iron: 68 ug/dL (ref 28–170)
Saturation Ratios: 18 % (ref 10.4–31.8)
TIBC: 377 ug/dL (ref 250–450)
UIBC: 309 ug/dL

## 2023-02-23 LAB — FERRITIN: Ferritin: 26 ng/mL (ref 11–307)

## 2023-02-24 ENCOUNTER — Inpatient Hospital Stay: Payer: Medicare Other

## 2023-02-25 ENCOUNTER — Encounter: Payer: Self-pay | Admitting: Oncology

## 2023-02-25 ENCOUNTER — Inpatient Hospital Stay: Payer: Medicare Other

## 2023-02-25 ENCOUNTER — Inpatient Hospital Stay (HOSPITAL_BASED_OUTPATIENT_CLINIC_OR_DEPARTMENT_OTHER): Payer: Medicare Other | Admitting: Oncology

## 2023-02-25 VITALS — BP 157/80 | HR 107 | Temp 98.7°F | Resp 18 | Wt 125.7 lb

## 2023-02-25 DIAGNOSIS — E538 Deficiency of other specified B group vitamins: Secondary | ICD-10-CM | POA: Diagnosis not present

## 2023-02-25 DIAGNOSIS — D509 Iron deficiency anemia, unspecified: Secondary | ICD-10-CM

## 2023-02-25 DIAGNOSIS — D72828 Other elevated white blood cell count: Secondary | ICD-10-CM

## 2023-02-25 NOTE — Progress Notes (Signed)
Hematology oncology progress note   ASSESSMENT & PLAN:   Iron deficiency anemia #Recurrent chronic iron deficiency anemia, status post IV Venofer treatments. Labs reviewed and discussed with patient. Lab Results  Component Value Date   HGB 10.8 (L) 02/23/2023   TIBC 377 02/23/2023   IRONPCTSAT 18 02/23/2023   FERRITIN 26 02/23/2023    Hold off IV venofer for now.  Recommend patient to continue Integra [contains iron]   B12 deficiency Vitamin B12 deficiency, patient is on vitamin B12  injection monthly.  Patient prefers to have her chiropractor admitting B12 for her.  Leukocytosis Possible secondary to steroid use.   Orders Placed This Encounter  Procedures   CBC with Differential (Cancer Center Only)    Standing Status:   Future    Expected Date:   05/26/2023    Expiration Date:   02/25/2024   Iron and TIBC    Standing Status:   Future    Expected Date:   05/26/2023    Expiration Date:   02/25/2024   Ferritin    Standing Status:   Future    Expected Date:   05/26/2023    Expiration Date:   02/25/2024   Retic Panel    Standing Status:   Future    Expected Date:   05/26/2023    Expiration Date:   02/25/2024   Vitamin B12    Standing Status:   Future    Expected Date:   05/26/2023    Expiration Date:   02/25/2024    Follow up in 3 months.   All questions were answered. The patient knows to call the clinic with any problems, questions or concerns.  Rickard Patience, MD, PhD [facility] Hematology Oncology 02/25/2023    Chief Complaint: Ashley Mack is a 77 y.o. female with iron deficiency anemia and B12 deficiency  presents to follow-up.    INTERVAL HISTORY Ashley Mack is a 77 y.o. female who has above history reviewed by me today presents for follow up visit for management of anemia and B12 deficiency Problems and complaints are listed below: Patient previously followed up by Dr.Corcoran, patient switched care to me on 08/15/20 Extensive medical record review was  performed by me  Chronic recurrent iron deficiency anemia   She had a colonoscopy on 03/18/2009. She has had several EGDs with the last on 10/12/2008.  She states that she has had polyps, reflux and a hiatal hernia.EGD on 12/09/2016 was normal, other than demonstrating a small hiatal hernia. Colonoscopy on 12/09/2016 demonstrated friability with contact bleeding in the proximal transverse colon. A 1 mm sessile polyp was found in the sigmoid colon, in addition to grade II non-bleeding internal hemorrhoids were also noted. Pathology results demonstrated a tubular adenoma that was negative for dyplasia or malignancy. 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   Patient has had a multiple IV Venofer treatments. receives Venofer if her ferritin is < 30.   Vitamin B12 deficiency, She began weekly B12 on 09/08/2016 and continued x 6 weeks (last 10/16/2016). She began monthly B12 on 11/26/2016 (last 02/17/2018).  Patient used to get vitamin B12 prescription from Dr. Merlene Pulling and her chiropractor injects for her.   She has rheumatoid arthritis.  She has been treated with methotrexate, Imuran, leflunomide, and sulfasalazine.  She is on prednisone 5 mg a day x 2 years.  She is s/p kyphoplasty x 5 since 04/2017.    INTERVAL HISTORY Ashley Mack is a 77 y.o. female who has above history reviewed  by me today presents for follow up visit for management of vitamin B12 deficiency and iron deficiency anemia.  S/p IV multiple doses of IV Venofer treatments.. She feels well today Denies any bloody or black stool.    Review of Systems  Constitutional:  Positive for fatigue. Negative for appetite change and unexpected weight change.  HENT:   Negative for mouth sores.   Respiratory:  Negative for shortness of breath.   Cardiovascular:  Negative for chest pain.  Gastrointestinal:  Negative for abdominal pain and blood in stool.  Genitourinary:  Negative for dysuria.   Musculoskeletal:  Positive for arthralgias  and back pain.  Neurological:  Negative for headaches.  Psychiatric/Behavioral:  Negative for confusion.      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)    Iron (Fe) deficiency anemia    Migraine headache    in past   Migraines    Motion sickness    No natural teeth    Osteoporosis    Rheumatoid arthritis (HCC)    Vertigo     Past Surgical History:  Procedure Laterality Date   CATARACT EXTRACTION W/PHACO Right 08/12/2016   Procedure: CATARACT EXTRACTION PHACO AND INTRAOCULAR LENS PLACEMENT (IOC)  Right;  Surgeon: Lockie Mola, MD;  Location: [facility];  Service: Ophthalmology;  Laterality: Right;   CATARACT EXTRACTION W/PHACO Left 09/30/2016   Procedure: CATARACT EXTRACTION PHACO AND INTRAOCULAR LENS PLACEMENT (IOC) Left;  Surgeon: Lockie Mola, MD;  Location: [facility];  Service: Ophthalmology;  Laterality: Left;   COLONOSCOPY     COLONOSCOPY WITH PROPOFOL N/A 12/09/2016   Procedure: COLONOSCOPY WITH PROPOFOL;  Surgeon: Toledo, Boykin Nearing, MD;  Location: [facility];  Service: Endoscopy;  Laterality: N/A;   ESOPHAGOGASTRODUODENOSCOPY     ESOPHAGOGASTRODUODENOSCOPY (EGD) WITH PROPOFOL N/A 12/09/2016   Procedure: ESOPHAGOGASTRODUODENOSCOPY (EGD) WITH PROPOFOL;  Surgeon: Toledo, Boykin Nearing, MD;  Location: [facility];  Service: Endoscopy;  Laterality: N/A;   KYPHOPLASTY N/A 08/10/2017   Procedure: Nicki Reaper;  Surgeon: Kennedy Bucker, MD;  Location: [facility];  Service: Orthopedics;  Laterality: N/A;   KYPHOPLASTY N/A 09/09/2017   Procedure: [id number]-K2,H0;  Surgeon: Kennedy Bucker, MD;  Location: [facility];   Service: Orthopedics;  Laterality: N/A;   KYPHOPLASTY N/A 09/28/2017   Procedure: [id number];  Surgeon: Kennedy Bucker, MD;  Location: [facility];  Service: Orthopedics;  Laterality: N/A;   KYPHOPLASTY N/A 06/19/2019   Procedure: KYPHOPLASTY T5;  Surgeon: Kennedy Bucker, MD;  Location: [facility];  Service: Orthopedics;  Laterality: N/A;   OOPHORECTOMY Bilateral 2003   RIGHT/LEFT HEART CATH AND CORONARY ANGIOGRAPHY Bilateral 10/19/2019   Procedure: RIGHT/LEFT HEART CATH AND CORONARY ANGIOGRAPHY;  Surgeon: Alwyn Pea, MD;  Location: [facility];  Service: Cardiovascular;  Laterality: Bilateral;    Family History  Problem Relation Age of Onset   Kidney disease Mother    Diabetes Mellitus II Brother     Social History:  reports that she quit smoking about 15 years ago. Her smoking use included cigarettes. She has never used smokeless tobacco. She reports that she does  not drink alcohol and does not use drugs. She quit smoking in 2009.  She lives in [place]. Her step daughter is Hospital doctor. The patient is accompanied by [facility] today.  Allergies:  Allergies  Allergen Reactions   Methotrexate Derivatives Nausea And Vomiting and Other (See Comments)    Flu like symptoms   Other Nausea Only and Other (See Comments)    Arthritis medications . Unsure of what the med was.   Ranitidine Hives   Sulfasalazine Other (See Comments)    Stomach upset   Topiramate Nausea Only    Current Medications: Current Outpatient Medications  Medication Sig Dispense Refill   albuterol (VENTOLIN HFA) 108 (90 Base) MCG/ACT inhaler Inhale 2 puffs into the lungs every 6 (six) hours as needed for wheezing or shortness of breath.      budesonide-formoterol (SYMBICORT) 160-4.5 MCG/ACT inhaler Inhale 2 puffs into the lungs 2 (two) times daily.     Cholecalciferol (VITAMIN D3) 2000 UNITS capsule Take 2,000 Units by mouth daily.      cyanocobalamin (VITAMIN B12) 1000 MCG/ML injection INJECT INTO THE MUSCLE  ONCE EVERY 30 DAYS 3 mL 0   cyclobenzaprine (FLEXERIL) 5 MG tablet Take 5 mg by mouth 2 (two) times daily.     dexlansoprazole (DEXILANT) 60 MG capsule Take 60 mg by mouth daily.     diclofenac Sodium (VOLTAREN) 1 % GEL Apply 2 g topically as needed.     Fe Fum-FePoly-Vit C-Vit B3 (INTEGRA) 62.5-62.5-40-3 MG CAPS Take 1 capsule by mouth daily.     fluticasone (FLONASE) 50 MCG/ACT nasal spray Place into both nostrils daily.     predniSONE (DELTASONE) 5 MG tablet Take 5 mg by mouth daily.      Turmeric (QC TUMERIC COMPLEX) 500 MG CAPS Take 1 tablet by mouth daily.     No current facility-administered medications for this visit.      Performance status (ECOG): 1  Vitals Blood pressure (!) 157/80, pulse (!) 107, temperature 98.7 F (37.1 C), resp. rate 18, weight 125 lb 11.2 oz (57 kg), head circumference 7" (17.8 cm).   Physical Exam Vitals reviewed.  Constitutional:      General: She is not in acute distress.    Appearance: She is well-developed. She is not diaphoretic.     Comments: Patient sits in the wheelchair  Eyes:     General: No scleral icterus. Cardiovascular:     Rate and Rhythm: Normal rate.  Pulmonary:     Effort: Pulmonary effort is normal. No respiratory distress.  Abdominal:     General: There is no distension.     Palpations: Abdomen is soft.  Musculoskeletal:        General: Normal range of motion.     Cervical back: Normal range of motion and neck supple.  Lymphadenopathy:     Cervical: No cervical adenopathy.     Upper Body:     Right upper body: No supraclavicular adenopathy.     Left upper body: No supraclavicular adenopathy.  Skin:    General: Skin is warm and dry.     Findings: Bruising present.  Neurological:     Mental Status: She is alert. Mental status is at baseline.  Psychiatric:        Mood and Affect: Mood normal.

## 2023-02-25 NOTE — Assessment & Plan Note (Addendum)
#  Recurrent chronic iron deficiency anemia, status post IV Venofer treatments. Labs reviewed and discussed with patient. Lab Results  Component Value Date   HGB 10.8 (L) 02/23/2023   TIBC 377 02/23/2023   IRONPCTSAT 18 02/23/2023   FERRITIN 26 02/23/2023    Hold off IV venofer for now.  Recommend patient to continue Integra [contains iron]

## 2023-02-25 NOTE — Assessment & Plan Note (Signed)
Possible secondary to steroid use.

## 2023-02-25 NOTE — Assessment & Plan Note (Signed)
Vitamin B12 deficiency, patient is on vitamin B12  injection monthly.  Patient prefers to have her chiropractor admitting B12 for her. 

## 2023-03-24 ENCOUNTER — Other Ambulatory Visit: Payer: Self-pay | Admitting: Oncology

## 2023-05-21 ENCOUNTER — Telehealth: Payer: Self-pay

## 2023-05-22 ENCOUNTER — Emergency Department
Admission: EM | Admit: 2023-05-22 | Discharge: 2023-05-22 | Disposition: A | Discharge status: Home / Self Care | Attending: Emergency Medicine | Admitting: Emergency Medicine

## 2023-05-22 ENCOUNTER — Ambulatory Visit: Payer: Self-pay

## 2023-05-22 ENCOUNTER — Other Ambulatory Visit: Payer: Self-pay

## 2023-05-22 DIAGNOSIS — M542 Cervicalgia: Secondary | ICD-10-CM | POA: Diagnosis present

## 2023-05-22 NOTE — ED Notes (Signed)
 77 yof sitting upright in the bed, head elevated with a c/c of neck strain behind the right ear. The pt advised she was getting up into a truck when she hurt her neck. The pt advised she has something protruding out of her neck on the right side. The pt is alert and oriented x 4. The pt is warm, pink, and dry. The pt advised she is having some pain upon movement and that she knows it's there.

## 2023-05-22 NOTE — ED Provider Notes (Signed)
 [facility] Provider Note    Event Date/Time   First MD Initiated Contact with Patient 05/22/23 1027     (approximate)   History   muscle strain   HPI  Ashley Mack is a 78 y.o. female with PMH of osteoporosis, RA, migraines, anxiety, central lobar emphysema, autoimmune retinopathy who presents for evaluation as right sided neck pain.  Patient states she pulled a muscle getting into a truck about 10 days ago.  She still has some ongoing neck pain.  Patient spoke with someone at urgent care who recommended that she come to the emergency department for a CT scan of her neck.      Physical Exam   Triage Vital Signs: ED Triage Vitals  Encounter Vitals Group     BP 05/22/23 0944 (!) 173/84     Systolic BP Percentile --      Diastolic BP Percentile --      Pulse Rate 05/22/23 0944 (!) 109     Resp 05/22/23 0944 18     Temp 05/22/23 0944 99 F (37.2 C)     Temp Source 05/22/23 0944 Oral     SpO2 05/22/23 0944 96 %     Weight 05/22/23 0944 125 lb (56.7 kg)     Height 05/22/23 0944 5\' 2"  (1.575 m)     Head Circumference --      Peak Flow --      Pain Score 05/22/23 0953 3     Pain Loc --      Pain Education --      Exclude from Growth Chart --     Most recent vital signs: Vitals:   05/22/23 0944  BP: (!) 173/84  Pulse: (!) 109  Resp: 18  Temp: 99 F (37.2 C)  SpO2: 96%   General: Awake, no distress.  CV:  Good peripheral perfusion.  RRR. Resp:  Normal effort.  CTAB. Abd:  No distention.  Other:  Mild tenderness to palpation over the sternocleidomastoid on the right side.  Visible vein behind the right ear. Neck ROM well maintained. PERRL, EOM intact, no focal neuro deficits.   ED Results / Procedures / Treatments   Labs (all labs ordered are listed, but only abnormal results are displayed) Labs Reviewed - No data to display    PROCEDURES:  Critical Care performed: No  Procedures   MEDICATIONS ORDERED IN ED: Medications -  No data to display   IMPRESSION / MDM / ASSESSMENT AND PLAN / ED COURSE  I reviewed the triage vital signs and the nursing notes.                             78 year old female presents for evaluation of neck pain.  Patient is hypertensive and tachycardic.  Patient NAD on exam.  Differential diagnosis includes, but is not limited to, muscle strain, vertebral artery dissection, cervical radiculopathy, torticollis.  Patient's presentation is most consistent with acute, uncomplicated illness.  Physical exam was reassuring, patient has minimal tenderness to palpation and well-maintained range of motion.  Did consider artery dissection given the area of the pain, however patient does not have any neurological deficits so I do not feel that CTA is needed at this time.  Advised patient to continue taking her muscle relaxer as well as Tylenol for pain.  We discussed heat and ice.  She voiced understanding, all questions were answered and she was stable at discharge.  FINAL CLINICAL IMPRESSION(S) / ED DIAGNOSES   Final diagnoses:  Neck pain on right side     Rx / DC Orders   ED Discharge Orders     None        Note:  This document was prepared using Dragon voice recognition software and may include unintentional dictation errors.   Cameron Ali, PA-C 05/22/23 1129    Phineas Semen, MD 05/22/23 [phone]

## 2023-05-22 NOTE — Discharge Instructions (Addendum)
 Please take the muscle relaxer and tylenol as needed for pain.  You could try heating pads, ice packs and muscle creams. Follow up with your primary care provider as needed.   Return to the emergency department for worsening symptoms like blurry vision, headache or facial droop.

## 2023-05-22 NOTE — ED Triage Notes (Signed)
 Pt to ED POV with daughter for complaint of straining R neck (behind ear) a few days ago while pulling self up into truck. Called UC this AM and was told to come to ED for CT scan. Pt is moving head normally. Speech is clear. 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

## 2023-05-25 ENCOUNTER — Inpatient Hospital Stay: Attending: Oncology

## 2023-05-25 ENCOUNTER — Inpatient Hospital Stay: Payer: Medicare Other

## 2023-05-25 DIAGNOSIS — E538 Deficiency of other specified B group vitamins: Secondary | ICD-10-CM | POA: Diagnosis present

## 2023-05-25 DIAGNOSIS — D509 Iron deficiency anemia, unspecified: Secondary | ICD-10-CM

## 2023-05-25 DIAGNOSIS — R5383 Other fatigue: Secondary | ICD-10-CM | POA: Diagnosis not present

## 2023-05-25 DIAGNOSIS — Z7952 Long term (current) use of systemic steroids: Secondary | ICD-10-CM | POA: Diagnosis not present

## 2023-05-25 DIAGNOSIS — M069 Rheumatoid arthritis, unspecified: Secondary | ICD-10-CM | POA: Diagnosis not present

## 2023-05-25 DIAGNOSIS — D72829 Elevated white blood cell count, unspecified: Secondary | ICD-10-CM | POA: Insufficient documentation

## 2023-05-25 DIAGNOSIS — Z87891 Personal history of nicotine dependence: Secondary | ICD-10-CM | POA: Diagnosis not present

## 2023-05-25 LAB — CBC WITH DIFFERENTIAL (CANCER CENTER ONLY)
Abs Immature Granulocytes: 0.29 10*3/uL — ABNORMAL HIGH (ref 0.00–0.07)
Basophils Absolute: 0.1 10*3/uL (ref 0.0–0.1)
Basophils Relative: 1 %
Eosinophils Absolute: 0 10*3/uL (ref 0.0–0.5)
Eosinophils Relative: 0 %
HCT: 34.6 % — ABNORMAL LOW (ref 36.0–46.0)
Hemoglobin: 10.9 g/dL — ABNORMAL LOW (ref 12.0–15.0)
Immature Granulocytes: 2 %
Lymphocytes Relative: 6 %
Lymphs Abs: 0.9 10*3/uL (ref 0.7–4.0)
MCH: 27.7 pg (ref 26.0–34.0)
MCHC: 31.5 g/dL (ref 30.0–36.0)
MCV: 88 fL (ref 80.0–100.0)
Monocytes Absolute: 0.6 10*3/uL (ref 0.1–1.0)
Monocytes Relative: 4 %
Neutro Abs: 13.8 10*3/uL — ABNORMAL HIGH (ref 1.7–7.7)
Neutrophils Relative %: 87 %
Platelet Count: 476 10*3/uL — ABNORMAL HIGH (ref 150–400)
RBC: 3.93 MIL/uL (ref 3.87–5.11)
RDW: 15.9 % — ABNORMAL HIGH (ref 11.5–15.5)
WBC Count: 15.6 10*3/uL — ABNORMAL HIGH (ref 4.0–10.5)
nRBC: 0 % (ref 0.0–0.2)

## 2023-05-25 LAB — RETIC PANEL
Immature Retic Fract: 17.7 % — ABNORMAL HIGH (ref 2.3–15.9)
RBC.: 3.96 MIL/uL (ref 3.87–5.11)
Retic Count, Absolute: 98.2 10*3/uL (ref 19.0–186.0)
Retic Ct Pct: 2.5 % (ref 0.4–3.1)
Reticulocyte Hemoglobin: 27.6 pg — ABNORMAL LOW (ref >27.9)

## 2023-05-25 LAB — FERRITIN: Ferritin: 18 ng/mL (ref 11–307)

## 2023-05-25 LAB — VITAMIN B12: Vitamin B-12: 740 pg/mL (ref 180–914)

## 2023-05-25 LAB — IRON AND TIBC
Iron: 139 ug/dL (ref 28–170)
Saturation Ratios: 35 % — ABNORMAL HIGH (ref 10.4–31.8)
TIBC: 400 ug/dL (ref 250–450)
UIBC: 261 ug/dL

## 2023-05-27 ENCOUNTER — Inpatient Hospital Stay (HOSPITAL_BASED_OUTPATIENT_CLINIC_OR_DEPARTMENT_OTHER): Payer: Medicare Other | Admitting: Oncology

## 2023-05-27 ENCOUNTER — Encounter: Payer: Self-pay | Admitting: Oncology

## 2023-05-27 ENCOUNTER — Inpatient Hospital Stay: Payer: Medicare Other

## 2023-05-27 VITALS — BP 167/81 | HR 102 | Temp 96.9°F | Resp 18 | Wt 122.8 lb

## 2023-05-27 DIAGNOSIS — E538 Deficiency of other specified B group vitamins: Secondary | ICD-10-CM | POA: Diagnosis not present

## 2023-05-27 DIAGNOSIS — D509 Iron deficiency anemia, unspecified: Secondary | ICD-10-CM

## 2023-05-27 DIAGNOSIS — D72828 Other elevated white blood cell count: Secondary | ICD-10-CM

## 2023-05-27 NOTE — Assessment & Plan Note (Signed)
#  Recurrent chronic iron deficiency anemia, status post IV Venofer treatments. Labs reviewed and discussed with patient. Lab Results  Component Value Date   HGB 10.9 (L) 05/25/2023   TIBC 400 05/25/2023   IRONPCTSAT 35 (H) 05/25/2023   FERRITIN 18 05/25/2023    Hold off IV venofer for now.  Recommend patient to continue Integra [contains iron]

## 2023-05-27 NOTE — Progress Notes (Signed)
 Hematology oncology progress note   ASSESSMENT & PLAN:   Iron deficiency anemia #Recurrent chronic iron deficiency anemia, status post IV Venofer treatments. Labs reviewed and discussed with patient. Lab Results  Component Value Date   HGB 10.9 (L) 05/25/2023   TIBC 400 05/25/2023   IRONPCTSAT 35 (H) 05/25/2023   FERRITIN 18 05/25/2023    Hold off IV venofer for now.  Recommend patient to continue Integra [contains iron]   B12 deficiency Vitamin B12 deficiency, patient is on vitamin B12  injection monthly.  Patient prefers to have her chiropractor admitting B12 for her.  Leukocytosis Possible secondary to steroid use.   Orders Placed This Encounter  Procedures   CBC with Differential (Cancer Center Only)    Standing Status:   Future    Expected Date:   11/27/2023    Expiration Date:   05/26/2024   Iron and TIBC    Standing Status:   Future    Expected Date:   11/27/2023    Expiration Date:   05/26/2024   Ferritin    Standing Status:   Future    Expected Date:   11/27/2023    Expiration Date:   05/26/2024   Retic Panel    Standing Status:   Future    Expected Date:   11/27/2023    Expiration Date:   05/26/2024    Follow up in 6 months.   All questions were answered. The patient knows to call the clinic with any problems, questions or concerns.  Rickard Patience, MD, PhD [facility] Hematology Oncology 05/27/2023    Chief Complaint: Ashley Mack is a 78 y.o. female with iron deficiency anemia and B12 deficiency  presents to follow-up.    INTERVAL HISTORY Ashley Mack is a 78 y.o. female who has above history reviewed by me today presents for follow up visit for management of anemia and B12 deficiency Problems and complaints are listed below: Patient previously followed up by Dr.Corcoran, patient switched care to me on 08/15/20 Extensive medical record review was performed by me  Chronic recurrent iron deficiency anemia   She had a colonoscopy on 03/18/2009. She has  had several EGDs with the last on 10/12/2008.  She states that she has had polyps, reflux and a hiatal hernia.EGD on 12/09/2016 was normal, other than demonstrating a small hiatal hernia. Colonoscopy on 12/09/2016 demonstrated friability with contact bleeding in the proximal transverse colon. A 1 mm sessile polyp was found in the sigmoid colon, in addition to grade II non-bleeding internal hemorrhoids were also noted. Pathology results demonstrated a tubular adenoma that was negative for dyplasia or malignancy. 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   Patient has had a multiple IV Venofer treatments. receives Venofer if her ferritin is < 30.   Vitamin B12 deficiency, She began weekly B12 on 09/08/2016 and continued x 6 weeks (last 10/16/2016). She began monthly B12 on 11/26/2016 (last 02/17/2018).  Patient used to get vitamin B12 prescription from Dr. Merlene Pulling and her chiropractor injects for her.   She has rheumatoid arthritis.  She has been treated with methotrexate, Imuran, leflunomide, and sulfasalazine.  She is on prednisone 5 mg a day x 2 years.  She is s/p kyphoplasty x 5 since 04/2017.    INTERVAL HISTORY Ashley Mack is a 78 y.o. female who has above history reviewed by me today presents for follow up visit for management of vitamin B12 deficiency and iron deficiency anemia.  S/p IV multiple doses of IV Venofer treatments.Marland Kitchen  She feels well today Denies any bloody or black stool.    Review of Systems  Constitutional:  Positive for fatigue. Negative for appetite change and unexpected weight change.  HENT:   Negative for mouth sores.   Respiratory:  Negative for shortness of breath.   Cardiovascular:  Negative for chest pain.  Gastrointestinal:  Negative for abdominal pain and blood in stool.  Genitourinary:  Negative for dysuria.   Musculoskeletal:  Positive for arthralgias and back pain.  Neurological:  Negative for headaches.  Psychiatric/Behavioral:  Negative for confusion.       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;  Service: Ophthalmology;  Laterality: Right;   CATARACT EXTRACTION W/PHACO Left 09/30/2016   Procedure: CATARACT EXTRACTION PHACO AND INTRAOCULAR LENS PLACEMENT (IOC) Left;  Surgeon: Lockie Mola, MD;  Location: [facility];  Service: Ophthalmology;  Laterality: Left;   COLONOSCOPY     COLONOSCOPY WITH PROPOFOL N/A 12/09/2016   Procedure: COLONOSCOPY WITH PROPOFOL;  Surgeon: Toledo, Boykin Nearing, MD;  Location: [facility];  Service: Endoscopy;  Laterality: N/A;   ESOPHAGOGASTRODUODENOSCOPY     ESOPHAGOGASTRODUODENOSCOPY (EGD) WITH PROPOFOL N/A 12/09/2016   Procedure: ESOPHAGOGASTRODUODENOSCOPY (EGD) WITH PROPOFOL;  Surgeon: Toledo, Boykin Nearing, MD;  Location: [facility];  Service: Endoscopy;  Laterality: N/A;   KYPHOPLASTY N/A 08/10/2017   Procedure: Nicki Reaper;  Surgeon: Kennedy Bucker, MD;  Location: [facility];  Service: Orthopedics;  Laterality: N/A;   KYPHOPLASTY N/A 09/09/2017   Procedure: ZOXWRUEAVWU-J8,J1;  Surgeon: Kennedy Bucker, MD;  Location: [facility];  Service: Orthopedics;  Laterality: N/A;   KYPHOPLASTY N/A 09/28/2017   Procedure: [id number];  Surgeon:  Kennedy Bucker, MD;  Location: [facility];  Service: Orthopedics;  Laterality: N/A;   KYPHOPLASTY N/A 06/19/2019   Procedure: KYPHOPLASTY T5;  Surgeon: Kennedy Bucker, MD;  Location: [facility];  Service: Orthopedics;  Laterality: N/A;   OOPHORECTOMY Bilateral 2003   RIGHT/LEFT HEART CATH AND CORONARY ANGIOGRAPHY Bilateral 10/19/2019   Procedure: RIGHT/LEFT HEART CATH AND CORONARY ANGIOGRAPHY;  Surgeon: Alwyn Pea, MD;  Location: [facility];  Service: Cardiovascular;  Laterality: Bilateral;    Family History  Problem Relation Age of Onset   Kidney disease Mother    Diabetes Mellitus II Brother     Social History:  reports that she quit smoking about 16 years ago. Her smoking use included cigarettes. She has never used smokeless tobacco. She reports that she does not drink alcohol and does not use drugs. She quit smoking in 2009.  She lives in [place]. Her step daughter is Hospital doctor. The patient is accompanied  by [facility] today.  Allergies:  Allergies  Allergen Reactions   Methotrexate Derivatives Nausea And Vomiting and Other (See Comments)    Flu like symptoms   Other Nausea Only and Other (See Comments)    Arthritis medications . Unsure of what the med was.   Ranitidine Hives   Sulfasalazine Other (See Comments)    Stomach upset   Topiramate Nausea Only    Current Medications: Current Outpatient Medications  Medication Sig Dispense Refill   albuterol (VENTOLIN HFA) 108 (90 Base) MCG/ACT inhaler Inhale 2 puffs into the lungs every 6 (six) hours as needed for wheezing or shortness of breath.      budesonide-formoterol (SYMBICORT) 160-4.5 MCG/ACT inhaler Inhale 2 puffs into the lungs 2 (two) times daily.     Cholecalciferol (VITAMIN D3) 2000 UNITS capsule Take 2,000 Units by mouth daily.      cyanocobalamin (VITAMIN B12) 1000 MCG/ML injection INJECT INTO THE MUSCLE ONCE EVERY 30 DAYS 3 mL 0   cyclobenzaprine (FLEXERIL) 5 MG tablet Take 5 mg by mouth 2 (two) times daily.      dexlansoprazole (DEXILANT) 60 MG capsule Take 60 mg by mouth daily.     diclofenac Sodium (VOLTAREN) 1 % GEL Apply 2 g topically as needed.     Fe Fum-FePoly-Vit C-Vit B3 (INTEGRA) 62.5-62.5-40-3 MG CAPS Take 1 capsule by mouth daily.     fluticasone (FLONASE) 50 MCG/ACT nasal spray Place into both nostrils daily.     predniSONE (DELTASONE) 5 MG tablet Take 5 mg by mouth daily.      Turmeric (QC TUMERIC COMPLEX) 500 MG CAPS Take 1 tablet by mouth daily.     No current facility-administered medications for this visit.      Performance status (ECOG): 1  Vitals Blood pressure (!) 167/81, pulse (!) 102, temperature (!) 96.9 F (36.1 C), temperature source Tympanic, resp. rate 18, weight 122 lb 12.8 oz (55.7 kg), SpO2 97%.   Physical Exam Vitals reviewed.  Constitutional:      General: She is not in acute distress.    Appearance: She is well-developed. She is not diaphoretic.     Comments: Patient sits in the wheelchair  Eyes:     General: No scleral icterus. Cardiovascular:     Rate and Rhythm: Normal rate.  Pulmonary:     Effort: Pulmonary effort is normal. No respiratory distress.  Abdominal:     General: There is no distension.     Palpations: Abdomen is soft.  Musculoskeletal:        General: Normal range of motion.     Cervical back: Normal range of motion and neck supple.  Lymphadenopathy:     Cervical: No cervical adenopathy.     Upper Body:     Right upper body: No supraclavicular adenopathy.     Left upper body: No supraclavicular adenopathy.  Skin:    General: Skin is warm and dry.     Findings: Bruising present.  Neurological:     Mental Status: She is alert. Mental status is at baseline.  Psychiatric:        Mood and Affect: Mood normal.

## 2023-05-27 NOTE — Assessment & Plan Note (Signed)
Possible secondary to steroid use.

## 2023-05-27 NOTE — Assessment & Plan Note (Signed)
Vitamin B12 deficiency, patient is on vitamin B12  injection monthly.  Patient prefers to have her chiropractor admitting B12 for her. 

## 2023-06-01 ENCOUNTER — Ambulatory Visit
Admission: EM | Admit: 2023-06-01 | Discharge: 2023-06-01 | Disposition: A | Discharge status: Home / Self Care | Attending: Emergency Medicine | Admitting: Emergency Medicine

## 2023-06-01 ENCOUNTER — Encounter: Payer: Self-pay | Admitting: Emergency Medicine

## 2023-06-01 DIAGNOSIS — E871 Hypo-osmolality and hyponatremia: Secondary | ICD-10-CM | POA: Diagnosis present

## 2023-06-01 DIAGNOSIS — J101 Influenza due to other identified influenza virus with other respiratory manifestations: Secondary | ICD-10-CM | POA: Diagnosis present

## 2023-06-01 DIAGNOSIS — J441 Chronic obstructive pulmonary disease with (acute) exacerbation: Secondary | ICD-10-CM | POA: Insufficient documentation

## 2023-06-01 LAB — BASIC METABOLIC PANEL
Anion gap: 11 (ref 5–15)
BUN: 21 mg/dL (ref 8–23)
CO2: 26 mmol/L (ref 22–32)
Calcium: 9.2 mg/dL (ref 8.9–10.3)
Chloride: 91 mmol/L — ABNORMAL LOW (ref 98–111)
Creatinine, Ser: 0.79 mg/dL (ref 0.44–1.00)
GFR, Estimated: >60 mL/min (ref >60)
Glucose, Bld: 108 mg/dL — ABNORMAL HIGH (ref 70–99)
Potassium: 4.9 mmol/L (ref 3.5–5.1)
Sodium: 128 mmol/L — ABNORMAL LOW (ref 135–145)

## 2023-06-01 LAB — RESP PANEL BY RT-PCR (FLU A&B, COVID) ARPGX2
Influenza A by PCR: POSITIVE — AB
Influenza B by PCR: NEGATIVE
SARS Coronavirus 2 by RT PCR: NEGATIVE

## 2023-06-01 LAB — GROUP A STREP BY PCR: Group A Strep by PCR: NOT DETECTED

## 2023-06-01 MED ORDER — OSELTAMIVIR PHOSPHATE 75 MG PO CAPS: 75.0000 mg | ORAL_CAPSULE | Freq: Two times a day (BID) | ORAL | 0 refills | Status: AC

## 2023-06-01 MED ORDER — PREDNISONE 20 MG PO TABS: 40.0000 mg | ORAL_TABLET | Freq: Every day | ORAL | 0 refills | Status: AC

## 2023-06-01 MED ORDER — ALBUTEROL SULFATE HFA 108 (90 BASE) MCG/ACT IN AERS: 1.0000 | INHALATION_SPRAY | RESPIRATORY_TRACT | 0 refills | Status: AC | PRN

## 2023-06-01 MED ORDER — AEROCHAMBER MV MISC: 1 refills | Status: AC

## 2023-06-01 MED ORDER — OSELTAMIVIR PHOSPHATE 30 MG PO CAPS: 30.0000 mg | ORAL_CAPSULE | Freq: Two times a day (BID) | ORAL | 0 refills | Status: AC

## 2023-06-01 NOTE — Discharge Instructions (Signed)
 Do not go to the pharmacy yet.  Wait about an hour, I will call in the appropriate dose of Tamiflu based on your kidney function.  It takes about an hour for the labs to come back.  Finish the Tamiflu, even if you feel better.  Continue the exlair nasal spray, try saline nasal irrigation with a NeilMed sinus rinse and distilled water as often as you want.  Take two puffs from your albuterol inhaler with your spacer every 4 hours for 2 days, then every 6 hours for 2 days, then as needed. You can back off if you start to improve  sooner. Finish the steroids unless your doctor tells you to stop. Finish the Tamiflu, even if you feel better.  Take tylenol 1 gram up to 3-4 times a day as needed for pain. Make sure you drink extra fluids.  We may be able to add 200 mg of ibuprofen to this if your kidney function is normal.  Go immediately to the emergency department if you are feeling worse, having persistent fevers above 100.4, difficulty breathing, or concerns    If the spacer is too expensive at the pharmacy, you can get an AeroChamber Z-Stat off of [employer] for about $10-$15.  Go to www.goodrx.com  or www.costplusdrugs.com to look up your medications. This will give you a list of where you can find your prescriptions at the most affordable prices. Or ask the pharmacist what the cash price is, or if they have any other discount programs available to help make your medication more affordable. This can be less expensive than what you would pay with insurance.

## 2023-06-01 NOTE — ED Triage Notes (Addendum)
 Pt presents with a cough, sore throat,SOB, headache and bodyaches since yesterday. Pt has taken OTC pain medication for her symptoms.

## 2023-06-01 NOTE — ED Provider Notes (Signed)
 HPI  SUBJECTIVE:  Ashley Mack is a 78 y.o. female who presents with cough productive of thick sputum, headache, fatigue, body aches, nasal congestion, sore throat, wheezing, worsening shortness of breath at baseline, nausea and postnasal drip starting yesterday.  No fevers, rhinorrhea, chest pain, vomiting, diarrhea, abdominal pain.  No known flu exposure.  She did not get the flu vaccine.  No antipyretic in the past 6 hours.  She has tried  Xlear xylitol nasal spray with improvement in her nasal congestion and rhinorrhea.  1000 mg of Tylenol helps with her headache.  She has required her albuterol inhaler several times this morning, usually uses it as needed.  No aggravating factors. 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  She lives by herself.  No history of diabetes, chronic kidney disease.  [facility]   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)    Iron (Fe) deficiency anemia    Migraine headache    in past   Migraines    Motion sickness    No natural teeth    Osteoporosis    Rheumatoid arthritis (HCC)    Vertigo     Past Surgical History:  Procedure Laterality Date   CATARACT EXTRACTION W/PHACO Right 08/12/2016   Procedure: CATARACT EXTRACTION PHACO AND INTRAOCULAR LENS PLACEMENT (IOC)  Right;  Surgeon: Lockie Mola, MD;  Location: [facility];  Service: Ophthalmology;  Laterality: Right;   CATARACT EXTRACTION W/PHACO Left 09/30/2016   Procedure: CATARACT EXTRACTION PHACO  AND INTRAOCULAR LENS PLACEMENT (IOC) Left;  Surgeon: Lockie Mola, MD;  Location[facility];  Service: Ophthalmology;  Laterality: Left;   COLONOSCOPY     COLONOSCOPY WITH PROPOFOL N/A 12/09/2016   Procedure: COLONOSCOPY WITH PROPOFOL;  Surgeon: Toledo, Boykin Nearing, MD;  Location: [facility];  Service: Endoscopy;  Laterality: N/A;   ESOPHAGOGASTRODUODENOSCOPY     ESOPHAGOGASTRODUODENOSCOPY (EGD) WITH PROPOFOL N/A 12/09/2016   Procedure: ESOPHAGOGASTRODUODENOSCOPY (EGD) WITH PROPOFOL;  Surgeon: Toledo, Boykin Nearing, MD;  Location: [facility];  Service: Endoscopy;  Laterality: N/A;   KYPHOPLASTY N/A 08/10/2017   Procedure: Nicki Reaper;  Surgeon: Kennedy Bucker, MD;  Location: [facility];  Service: Orthopedics;  Laterality: N/A;   KYPHOPLASTY N/A 09/09/2017   Procedure: ZOXWRUEAVWU-J8,J1;  Surgeon: Kennedy Bucker, MD;  Location: [facility];  Service: Orthopedics;  Laterality: N/A;   KYPHOPLASTY N/A 09/28/2017   Procedure: [id number];  Surgeon: Kennedy Bucker, MD;  Location: [facility];  Service: Orthopedics;  Laterality: N/A;   KYPHOPLASTY N/A 06/19/2019   Procedure: KYPHOPLASTY T5;  Surgeon: Kennedy Bucker, MD;  Location: [facility];  Service: Orthopedics;  Laterality: N/A;   OOPHORECTOMY Bilateral 2003   RIGHT/LEFT HEART CATH AND CORONARY ANGIOGRAPHY Bilateral 10/19/2019   Procedure: RIGHT/LEFT HEART CATH AND CORONARY ANGIOGRAPHY;  Surgeon: Alwyn Pea, MD;  Location: [facility];  Service: Cardiovascular;  Laterality: Bilateral;    Family History  Problem Relation Age of Onset   Kidney disease Mother  Diabetes Mellitus II Brother     Social History   Tobacco Use   Smoking status: Former    Current packs/day: 0.00    Types: Cigarettes    Quit date: 2009    Years since quitting: 16.2   Smokeless tobacco: Never  Vaping Use   Vaping status: Never Used  Substance Use Topics   Alcohol use: No   Drug use: No    No current facility-administered medications  for this encounter.  Current Outpatient Medications:    albuterol (VENTOLIN HFA) 108 (90 Base) MCG/ACT inhaler, Inhale 1-2 puffs into the lungs every 4 (four) hours as needed for wheezing or shortness of breath., Disp: 1 each, Rfl: 0   predniSONE (DELTASONE) 20 MG tablet, Take 2 tablets (40 mg total) by mouth daily with breakfast for 5 days., Disp: 10 tablet, Rfl: 0   Spacer/Aero-Holding Chambers (AEROCHAMBER MV) inhaler, Use as instructed, Disp: 1 each, Rfl: 1   budesonide-formoterol (SYMBICORT) 160-4.5 MCG/ACT inhaler, Inhale 2 puffs into the lungs 2 (two) times daily., Disp: , Rfl:    Cholecalciferol (VITAMIN D3) 2000 UNITS capsule, Take 2,000 Units by mouth daily. , Disp: , Rfl:    cyanocobalamin (VITAMIN B12) 1000 MCG/ML injection, INJECT INTO THE MUSCLE ONCE EVERY 30 DAYS, Disp: 3 mL, Rfl: 0   cyclobenzaprine (FLEXERIL) 5 MG tablet, Take 5 mg by mouth 2 (two) times daily., Disp: , Rfl:    dexlansoprazole (DEXILANT) 60 MG capsule, Take 60 mg by mouth daily., Disp: , Rfl:    diclofenac Sodium (VOLTAREN) 1 % GEL, Apply 2 g topically as needed., Disp: , Rfl:    Fe Fum-FePoly-Vit C-Vit B3 (INTEGRA) 62.5-62.5-40-3 MG CAPS, Take 1 capsule by mouth daily., Disp: , Rfl:    fluticasone (FLONASE) 50 MCG/ACT nasal spray, Place into both nostrils daily., Disp: , Rfl:    [Paused] predniSONE (DELTASONE) 5 MG tablet, Take 5 mg by mouth daily. , Disp: , Rfl:    Turmeric (QC TUMERIC COMPLEX) 500 MG CAPS, Take 1 tablet by mouth daily., Disp: , Rfl:   Allergies  Allergen Reactions   Methotrexate Derivatives Nausea And Vomiting and Other (See Comments)    Flu like symptoms   Other Nausea Only and Other (See Comments)    Arthritis medications . Unsure of what the med was.   Ranitidine Hives   Sulfasalazine Other (See Comments)    Stomach upset   Topiramate Nausea Only     ROS  As noted in HPI.   Physical Exam  BP (!) 142/84 (BP Location: Left Arm)   Pulse (!) 101   Temp 98.4 F (36.9 C)  (Oral)   Resp 18   SpO2 93%   Constitutional: Well developed, well nourished, no acute distress Eyes: PERRL, EOMI, conjunctiva normal bilaterally HENT: Normocephalic, atraumatic,mucus membranes moist Respiratory: Clear to auscultation bilaterally, no rales, no wheezing, no rhonchi Cardiovascular: Normal rate and rhythm, no murmurs, no gallops, no rubs GI: nondistended skin: No rash, skin intact Musculoskeletal: no deformities Neurologic: Alert & oriented x 3, CN III-XII grossly intact, no motor deficits, sensation grossly intact Psychiatric: Speech and behavior appropriate   ED Course   Medications - No data to display  Orders Placed This Encounter  Procedures   Resp Panel by RT-PCR (Flu A&B, Covid) Anterior Nasal Swab    Standing Status:   Standing    Number of Occurrences:   1   Group A Strep by PCR    Standing Status:   Standing  Number of Occurrences:   1   Basic metabolic panel    Standing Status:   Standing    Number of Occurrences:   1   Results for orders placed or performed during the hospital encounter of 06/01/23 (from the past 24 hours)  Resp Panel by RT-PCR (Flu A&B, Covid) Anterior Nasal Swab     Status: Abnormal   Collection Time: 06/01/23 11:12 AM   Specimen: Anterior Nasal Swab  Result Value Ref Range   SARS Coronavirus 2 by RT PCR NEGATIVE NEGATIVE   Influenza A by PCR POSITIVE (A) NEGATIVE   Influenza B by PCR NEGATIVE NEGATIVE  Group A Strep by PCR     Status: None   Collection Time: 06/01/23 11:12 AM   Specimen: Throat; Sterile Swab  Result Value Ref Range   Group A Strep by PCR NOT DETECTED NOT DETECTED  Basic metabolic panel     Status: Abnormal   Collection Time: 06/01/23  1:05 PM  Result Value Ref Range   Sodium 128 (L) 135 - 145 mmol/L   Potassium 4.9 3.5 - 5.1 mmol/L   Chloride 91 (L) 98 - 111 mmol/L   CO2 26 22 - 32 mmol/L   Glucose, Bld 108 (H) 70 - 99 mg/dL   BUN 21 8 - 23 mg/dL   Creatinine, Ser 1.61 0.44 - 1.00 mg/dL   Calcium  9.2 8.9 - 09.6 mg/dL   GFR, Estimated >04 >54 mL/min   Anion gap 11 5 - 15   No results found.  ED Clinical Impression  1. Influenza A   2. COPD exacerbation (HCC)   3. Hyponatremia      ED Assessment/Plan    Patient presents with acute illness with systemic symptoms of tachycardia.  1.  Influenza A.  Influenza A positive.  COVID, strep negative.  O2 sats of 93% noted today.  Has been 96, 97% on previous visits.  I suspect she is having a COPD exacerbation in addition to the influenza.  Deferring chest x-ray in the absence of fevers, focal lung findings.  I had very long discussion with patient and friend who brings her in today that she is at high risk for severe illness, and to have a very low threshold to go to the emergency department.  2.  Renal insufficiency.  Calculated creatinine clearance based on labs done in August 2024 57 mL/min.  Rechecking creatinine today. Creatinine clearance based on labs done today 51 mL/minute.  Will need to renally dose the Tamiflu to 75 mg for 1 dose, then 30 mg twice a day.  Will E prescribe this to pharmacy on record.  3.  Moderate hyponatremia.  This is new, although the last available labs were in September 2023.  Patient's symptoms of headache, nausea, fatigue could be from influenza, but could also be symptoms of acute hyponatremia.  Called patient and discussed low sodium level with her, advised that she should go to the emergency department to have this rechecked.  It is difficult to tell if her symptoms are coming from influenza or from acute hyponatremia.  I feel that she is stable to go by private vehicle.  She agrees to go.  Will call in renally dosed prescription of Tamiflu so it is ready for her if she gets cleared to go home by the ED.     Meds ordered this encounter  Medications   predniSONE (DELTASONE) 20 MG tablet    Sig: Take 2 tablets (40 mg total) by mouth  daily with breakfast for 5 days.    Dispense:  10 tablet     Refill:  0   albuterol (VENTOLIN HFA) 108 (90 Base) MCG/ACT inhaler    Sig: Inhale 1-2 puffs into the lungs every 4 (four) hours as needed for wheezing or shortness of breath.    Dispense:  1 each    Refill:  0   Spacer/Aero-Holding Chambers (AEROCHAMBER MV) inhaler    Sig: Use as instructed    Dispense:  1 each    Refill:  1      *This clinic note was created using Dragon dictation software. Therefore, there may be occasional mistakes despite careful proofreading. ?    Domenick Gong, MD 06/01/23 1404

## 2023-06-22 ENCOUNTER — Other Ambulatory Visit: Payer: Self-pay | Admitting: Oncology

## 2023-06-25 ENCOUNTER — Encounter: Payer: Self-pay | Admitting: Hematology and Oncology

## 2023-06-27 ENCOUNTER — Other Ambulatory Visit: Payer: Self-pay | Admitting: Oncology

## 2023-06-28 ENCOUNTER — Encounter: Payer: Self-pay | Admitting: Hematology and Oncology

## 2023-06-30 ENCOUNTER — Other Ambulatory Visit: Payer: Self-pay | Admitting: Oncology

## 2023-07-01 ENCOUNTER — Encounter: Payer: Self-pay | Admitting: Hematology and Oncology

## 2023-11-18 ENCOUNTER — Inpatient Hospital Stay: Attending: Oncology

## 2023-11-18 DIAGNOSIS — R5383 Other fatigue: Secondary | ICD-10-CM | POA: Insufficient documentation

## 2023-11-18 DIAGNOSIS — D72829 Elevated white blood cell count, unspecified: Secondary | ICD-10-CM | POA: Insufficient documentation

## 2023-11-18 DIAGNOSIS — E538 Deficiency of other specified B group vitamins: Secondary | ICD-10-CM | POA: Diagnosis not present

## 2023-11-18 DIAGNOSIS — Z87891 Personal history of nicotine dependence: Secondary | ICD-10-CM | POA: Insufficient documentation

## 2023-11-18 DIAGNOSIS — D509 Iron deficiency anemia, unspecified: Secondary | ICD-10-CM | POA: Insufficient documentation

## 2023-11-18 DIAGNOSIS — M069 Rheumatoid arthritis, unspecified: Secondary | ICD-10-CM | POA: Diagnosis not present

## 2023-11-18 LAB — CBC WITH DIFFERENTIAL (CANCER CENTER ONLY)
Abs Immature Granulocytes: 0.16 K/uL — ABNORMAL HIGH (ref 0.00–0.07)
Basophils Absolute: 0 K/uL (ref 0.0–0.1)
Basophils Relative: 0 %
Eosinophils Absolute: 0 K/uL (ref 0.0–0.5)
Eosinophils Relative: 0 %
HCT: 37.1 % (ref 36.0–46.0)
Hemoglobin: 11.7 g/dL — ABNORMAL LOW (ref 12.0–15.0)
Immature Granulocytes: 1 %
Lymphocytes Relative: 7 %
Lymphs Abs: 1 K/uL (ref 0.7–4.0)
MCH: 27.9 pg (ref 26.0–34.0)
MCHC: 31.5 g/dL (ref 30.0–36.0)
MCV: 88.5 fL (ref 80.0–100.0)
Monocytes Absolute: 0.6 K/uL (ref 0.1–1.0)
Monocytes Relative: 4 %
Neutro Abs: 13.7 K/uL — ABNORMAL HIGH (ref 1.7–7.7)
Neutrophils Relative %: 88 %
Platelet Count: 471 K/uL — ABNORMAL HIGH (ref 150–400)
RBC: 4.19 MIL/uL (ref 3.87–5.11)
RDW: 15.8 % — ABNORMAL HIGH (ref 11.5–15.5)
WBC Count: 15.5 K/uL — ABNORMAL HIGH (ref 4.0–10.5)
nRBC: 0 % (ref 0.0–0.2)

## 2023-11-18 LAB — IRON AND TIBC
Iron: 187 ug/dL — ABNORMAL HIGH (ref 28–170)
Saturation Ratios: 48 % — ABNORMAL HIGH (ref 10.4–31.8)
TIBC: 393 ug/dL (ref 250–450)
UIBC: 206 ug/dL

## 2023-11-18 LAB — RETIC PANEL
Immature Retic Fract: 11.7 % (ref 2.3–15.9)
RBC.: 4.16 MIL/uL (ref 3.87–5.11)
Retic Count, Absolute: 72.8 K/uL (ref 19.0–186.0)
Retic Ct Pct: 1.8 % (ref 0.4–3.1)
Reticulocyte Hemoglobin: 31.3 pg (ref >27.9)

## 2023-11-18 LAB — FERRITIN: Ferritin: 19 ng/mL (ref 11–307)

## 2023-11-23 ENCOUNTER — Other Ambulatory Visit

## 2023-11-25 ENCOUNTER — Inpatient Hospital Stay

## 2023-11-25 ENCOUNTER — Inpatient Hospital Stay (HOSPITAL_BASED_OUTPATIENT_CLINIC_OR_DEPARTMENT_OTHER): Admitting: Oncology

## 2023-11-25 ENCOUNTER — Encounter: Payer: Self-pay | Admitting: Oncology

## 2023-11-25 VITALS — BP 136/76 | HR 107 | Temp 98.6°F | Resp 20 | Wt 123.3 lb

## 2023-11-25 DIAGNOSIS — D72828 Other elevated white blood cell count: Secondary | ICD-10-CM

## 2023-11-25 DIAGNOSIS — E538 Deficiency of other specified B group vitamins: Secondary | ICD-10-CM | POA: Diagnosis not present

## 2023-11-25 DIAGNOSIS — D509 Iron deficiency anemia, unspecified: Secondary | ICD-10-CM | POA: Diagnosis not present

## 2023-11-25 NOTE — Assessment & Plan Note (Signed)
#  Recurrent chronic iron  deficiency anemia, status post IV Venofer  treatments. Labs reviewed and discussed with patient. Lab Results  Component Value Date   HGB 11.7 (L) 11/18/2023   TIBC 393 11/18/2023   IRONPCTSAT 48 (H) 11/18/2023   FERRITIN 19 11/18/2023    Hold off IV venofer  for now.  Recommend patient to continue Integra [contains iron ]

## 2023-11-25 NOTE — Assessment & Plan Note (Signed)
Possible secondary to steroid use.

## 2023-11-25 NOTE — Assessment & Plan Note (Signed)
 Vitamin B12 deficiency, previously on vitamin B12  injection monthly.  Currently she takes otc liquid B12 100mcg daily.

## 2023-11-25 NOTE — Progress Notes (Signed)
 Hematology oncology progress note   ASSESSMENT & PLAN:   Iron  deficiency anemia #Recurrent chronic iron  deficiency anemia, status post IV Venofer  treatments. Labs reviewed and discussed with patient. Lab Results  Component Value Date   HGB 11.7 (L) 11/18/2023   TIBC 393 11/18/2023   IRONPCTSAT 48 (H) 11/18/2023   FERRITIN 19 11/18/2023    Hold off IV venofer  for now.  Recommend patient to continue Integra [contains iron ]   B12 deficiency Vitamin B12 deficiency, previously on vitamin B12  injection monthly.  Currently she takes otc liquid B12 100mcg daily.    Leukocytosis Possible secondary to steroid use.  Patient elects not to have routine follow up. Recommend her to follow up with PCP. Patient may re-establish care in the future if clinically indicated. .   All questions were answered. The patient knows to call the clinic with any problems, questions or concerns.  Ashley Cap, Ashley Mack, Ashley Mack [facility] Hematology Oncology 11/25/2023    Chief Complaint: Ashley Mack is a 78 y.o. female with iron  deficiency anemia and B12 deficiency  presents to follow-up.    INTERVAL HISTORY Ashley Mack is a 78 y.o. female who has above history reviewed by me today presents for follow up visit for management of anemia and B12 deficiency Problems and complaints are listed below: Patient previously followed up by Dr.Corcoran, patient switched care to me on 08/15/20 Extensive medical record review was performed by me  Chronic recurrent iron  deficiency anemia   She had a colonoscopy on 03/18/2009. She has had several EGDs with the last on 10/12/2008.  She states that she has had polyps, reflux and a hiatal hernia.EGD on 12/09/2016 was normal, other than demonstrating a small hiatal hernia. Colonoscopy on 12/09/2016 demonstrated friability with contact bleeding in the proximal transverse colon. A 1 mm sessile polyp was found in the sigmoid colon, in addition to grade II non-bleeding internal  hemorrhoids were also noted. Pathology results demonstrated a tubular adenoma that was negative for dyplasia or malignancy. 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   Patient has had a multiple IV Venofer  treatments. receives Venofer  if her ferritin is < 30.   Vitamin B12 deficiency, She began weekly B12 on 09/08/2016 and continued x 6 weeks (last 10/16/2016). She began monthly B12 on 11/26/2016 (last 02/17/2018).  Patient used to get vitamin B12 prescription from Dr. Rudell and her chiropractor injects for her.   She has rheumatoid arthritis.  She has been treated with methotrexate, Imuran, leflunomide, and sulfasalazine.  She is on prednisone  5 mg a day x 2 years.  She is s/p kyphoplasty x 5 since 04/2017.    INTERVAL HISTORY Ashley Mack is a 78 y.o. female who has above history reviewed by me today presents for follow up visit for management of vitamin B12 deficiency and iron  deficiency anemia.  S/p IV multiple doses of IV Venofer  treatments.. She feels well today. She takes oral iron  supplementation and tolerates well.  Denies any bloody or black stool.    Review of Systems  Constitutional:  Positive for fatigue. Negative for appetite change and unexpected weight change.  HENT:   Negative for mouth sores.   Respiratory:  Negative for shortness of breath.   Cardiovascular:  Negative for chest pain.  Gastrointestinal:  Negative for abdominal pain and blood in stool.  Genitourinary:  Negative for dysuria.   Musculoskeletal:  Positive for arthralgias and back pain.  Neurological:  Negative for headaches.  Psychiatric/Behavioral:  Negative for confusion.  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)    Iron  (Fe) deficiency anemia    Migraine headache    in past   Migraines    Motion sickness    No natural teeth    Osteoporosis    Rheumatoid arthritis (HCC)    Vertigo     Past Surgical History:  Procedure Laterality Date   CATARACT EXTRACTION W/PHACO Right 08/12/2016   Procedure: CATARACT EXTRACTION PHACO AND INTRAOCULAR LENS PLACEMENT (IOC)  Right;  Surgeon: Mittie Gaskin, Ashley Mack;  Location: [facility];  Service: Ophthalmology;  Laterality: Right;   CATARACT EXTRACTION W/PHACO Left 09/30/2016   Procedure: CATARACT EXTRACTION PHACO AND INTRAOCULAR LENS PLACEMENT (IOC) Left;  Surgeon: Mittie Gaskin, Ashley Mack;  Location: [facility];  Service: Ophthalmology;  Laterality: Left;   COLONOSCOPY     COLONOSCOPY WITH PROPOFOL  N/A 12/09/2016   Procedure: COLONOSCOPY WITH PROPOFOL ;  Surgeon: Toledo, Ladell POUR, Ashley Mack;  Location: [facility];  Service: Endoscopy;  Laterality: N/A;   ESOPHAGOGASTRODUODENOSCOPY     ESOPHAGOGASTRODUODENOSCOPY (EGD) WITH PROPOFOL  N/A 12/09/2016   Procedure: ESOPHAGOGASTRODUODENOSCOPY (EGD) WITH PROPOFOL ;  Surgeon: Toledo, Ladell POUR, Ashley Mack;  Location: [facility];  Service: Endoscopy;  Laterality: N/A;   KYPHOPLASTY N/A 08/10/2017   Procedure: LYNN;  Surgeon: Kathlynn Sharper, Ashley Mack;  Location: [facility];  Service: Orthopedics;  Laterality: N/A;   KYPHOPLASTY N/A 09/09/2017   Procedure: XBEYNEOJDUB-O8,O5;  Surgeon: Kathlynn Sharper, Ashley Mack;  Location: [facility];  Service: Orthopedics;  Laterality: N/A;   KYPHOPLASTY N/A 09/28/2017   Procedure: XBEYNEOJDUB-U88;  Surgeon: Kathlynn Sharper, Ashley Mack;  Location: [facility];  Service: Orthopedics;  Laterality: N/A;   KYPHOPLASTY N/A 06/19/2019   Procedure: KYPHOPLASTY T5;  Surgeon: Kathlynn Sharper, Ashley Mack;  Location: [facility];  Service: Orthopedics;  Laterality: N/A;   OOPHORECTOMY Bilateral 2003   RIGHT/LEFT HEART CATH AND CORONARY ANGIOGRAPHY Bilateral 10/19/2019    Procedure: RIGHT/LEFT HEART CATH AND CORONARY ANGIOGRAPHY;  Surgeon: Florencio Cara BIRCH, Ashley Mack;  Location: [facility];  Service: Cardiovascular;  Laterality: Bilateral;    Family History  Problem Relation Age of Onset   Kidney disease Mother    Diabetes Mellitus II Brother     Social History:  reports that she quit smoking about 16 years ago. Her smoking use included cigarettes. She has never used smokeless tobacco. She reports that she does not drink alcohol and does not use drugs. She quit smoking in 2009.  She lives in [place]. Her step daughter is Hospital doctor. The patient is accompanied by [facility] today.  Allergies:  Allergies  Allergen Reactions   Methotrexate Derivatives Nausea And Vomiting and Other (See Comments)    Flu like symptoms   Other Nausea Only and Other (See Comments)    Arthritis medications . Unsure of what the med was.   Ranitidine Hives   Sulfasalazine Other (See Comments)    Stomach upset   Topiramate Nausea Only    Current Medications: Current Outpatient Medications  Medication Sig Dispense Refill   albuterol  (VENTOLIN  HFA) 108 (90 Base) MCG/ACT inhaler Inhale 1-2 puffs into the lungs every 4 (four)  hours as needed for wheezing or shortness of breath. 1 each 0   budesonide -formoterol  (SYMBICORT) 160-4.5 MCG/ACT inhaler Inhale 2 puffs into the lungs 2 (two) times daily.     Cholecalciferol  (VITAMIN D3) 2000 UNITS capsule Take 2,000 Units by mouth daily.      cyclobenzaprine  (FLEXERIL ) 5 MG tablet Take 5 mg by mouth 2 (two) times daily.     dexlansoprazole (DEXILANT) 60 MG capsule Take 60 mg by mouth daily.     diclofenac  Sodium (VOLTAREN ) 1 % GEL Apply 2 g topically as needed.     Fe Fum-FePoly-Vit C-Vit B3 (INTEGRA) 62.5-62.5-40-3 MG CAPS Take 1 capsule by mouth daily.     fluticasone  (FLONASE ) 50 MCG/ACT nasal spray Place into both nostrils daily.     ondansetron  (ZOFRAN -ODT) 4 MG disintegrating tablet Take 4 mg by mouth.     predniSONE  (DELTASONE ) 5 MG  tablet Take 5 mg by mouth daily.      Spacer/Aero-Holding Chambers (AEROCHAMBER MV) inhaler Use as instructed 1 each 1   traZODone  (DESYREL ) 50 MG tablet Take 50-100 mg by mouth.     Turmeric (QC TUMERIC COMPLEX) 500 MG CAPS Take 1 tablet by mouth daily.     No current facility-administered medications for this visit.      Performance status (ECOG): 1  Vitals Blood pressure 136/76, pulse (!) 107, temperature 98.6 F (37 C), resp. rate 20, weight 123 lb 4.8 oz (55.9 kg), SpO2 100%.   Physical Exam Vitals reviewed.  Constitutional:      General: She is not in acute distress.    Appearance: She is well-developed. She is not diaphoretic.     Comments: Patient sits in the wheelchair  Eyes:     General: No scleral icterus. Cardiovascular:     Rate and Rhythm: Normal rate.  Pulmonary:     Effort: Pulmonary effort is normal. No respiratory distress.  Abdominal:     General: There is no distension.     Palpations: Abdomen is soft.  Musculoskeletal:        General: Normal range of motion.     Cervical back: Normal range of motion and neck supple.  Lymphadenopathy:     Cervical: No cervical adenopathy.     Upper Body:     Right upper body: No supraclavicular adenopathy.     Left upper body: No supraclavicular adenopathy.  Skin:    General: Skin is warm and dry.     Findings: Bruising present.  Neurological:     Mental Status: She is alert. Mental status is at baseline.  Psychiatric:        Mood and Affect: Mood normal.

## 2023-12-23 NOTE — Progress Notes (Signed)
 Chief Complaint  Patient presents with  . Rheumatoid Arthritis    History of present illness:    Ashley Mack is a 78 y.o.female who presents today for follow-up evaluation of active severe Stage 3 rheumatoid arthritis with positive rheumatoid factor and a strong history of noncompliance and refusal of DMARD therapy. She was last seen 06/24/2023 and was doing poorly, but continued to decline any DMARD therapy and also declined any bloodwork preferring to continue prednisone  without any monitoring. We discussed the need for monitoring in order to continue prednisone , and after negotiation she agreed to get her A1c completed through hematology/oncology which was never completed.  She last got hand x-rays completed more than 2 years ago. She last completed a DEXA scan more than 2 years ago. She refuses serial monitoring of either imaging study.    Ashley Mack was diagnosed with rheumatoid arthritis with negative rheumatoid factor in 2012 by Dr. G.W. Kernodle. 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   Ashley Mack has tried the following rheumatologic medications in the past: - Methotrexate - GI adverse effects and headaches - Arava - GI upset - Sulfasalazine - GI upset - Imuran - GI upset - Plaquenil contraindicated by retinal disease - Prolia - Flu-like symptoms - Refuses biologics or other DMARDS - Prednisone  - current at 5mg  daily  She is doing about the same as usual with chronic daily pain and swelling, with only a few flares since last visit. She denies any skin lesions between her fingers since last visit.    Ashley Mack is currently being treated with prednisone  5mg  daily.  She has not experienced side effects from the medication.  She has not had any infections since last office visit.    She has not had any other significant health changes or unexplained new symptoms since last visit.   Ashley Mack's work status is retired. She is a former smoker who smokes 1 ppd for 50 years, for a  total of 50 pack years. She quit smoking in 2008. She drinks an average of 0 servings of alcohol weekly.   Review of Systems ROS was negative except as noted above.  Patient Active Problem List   Diagnosis Date Noted  . Noncompliance 12/28/2023  . Chronic respiratory failure with hypoxia (CMS/HHS-HCC) 11/06/2021  . Long term current use of systemic steroids 11/06/2021  . Statin declined 11/02/2019  . At high risk for falls 05/05/2018  . Healthcare maintenance 05/06/2017  . Centrilobular emphysema (CMS/HHS-HCC) 05/09/2015  . Iron  deficiency anemia   . Fatty liver   . Aortic atherosclerosis 12/25/2013  . HTN (hypertension), benign 12/10/2013  . Migraines 12/10/2013  . Chronic insomnia 12/10/2013  . Osteoporosis   ([facility]) 08/03/2013  . 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CMS/HHS-HCC)   . GERD (gastroesophageal reflux disease)   . Hernia   . Scotoma involving central area in visual field 03/29/2012    Past Medical History:  Diagnosis Date  . Atony of gallbladder   . Centrilobular emphysema (CMS/HHS-HCC) 05/09/2015   On ct at [facility]  . Fatty liver    on 2010 CT  . GERD (gastroesophageal reflux disease)   . Hernia   . 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 Iron  deficiency anemia   . Migraines   . Osteoporosis    a) bilateral ankle fractures, b) wrist fracture c) multiple vertebral compression fracture  . Rheumatoid arthritis(714.0) (CMS/HHS-HCC)  a) seropositive, positive anti-CCP antibodies, b) methotrexate complicated by nausea and headaches, c) Imuran complicated by nausea, d) GI upset with leflunomide, e) GI upset with sulfasalazine   Past Surgical History:  Procedure Laterality Date  . BILATERAL OOPHORECTOMY  2003  . COLONOSCOPY  03/18/2009   Repeat 10 Years  . COLONOSCOPY  12/09/2016   Tubular adenoma of colon/Repeat 35yrs/TKT  . EGD  12/09/2016    Normal EGD/No Repeat/TKT  . KYPHOPLASTY  08/10/2017   L2  Dr.Menz   . CARDIAC CATHETERIZATION    . EGD  06/05/2011, 06/13/2010, 10/12/2008   No repeat-rtm  . L1 & L4 Kyphoplasty 09/09/17     Dr Ozell Flake  . T11 Kyphoplasty 09/28/17     Ozell Flake, MD   Social History   Socioeconomic History  . Marital status: Single  Occupational History  . Occupation: Retired  Tobacco Use  . Smoking status: Former    Current packs/day: 0.00    Types: Cigarettes    Quit date: 2009    Years since quitting: 16.7  . Smokeless tobacco: Never  Vaping Use  . Vaping status: Never Used  Substance and Sexual Activity  . Alcohol use: No    Alcohol/week: 0.0 standard drinks of alcohol  . Drug use: No  . Sexual activity: Defer   Social Drivers of Health   Financial Resource Strain: Low Risk  (11/18/2023)   Overall Financial Resource Strain (CARDIA)   . Difficulty of Paying Living Expenses: Not hard at all  Food Insecurity: No Food Insecurity (11/18/2023)   Hunger Vital Sign   . Worried About Programme Researcher, Broadcasting/film/video in the Last Year: Never true   . Ran Out of Food in the Last Year: Never true  Transportation Needs: No Transportation Needs (11/18/2023)   PRAPARE - Transportation   . Lack of Transportation (Medical): No   . Lack of Transportation (Non-Medical): No  Housing Stability: Low Risk  (11/18/2023)   Housing Stability Vital Sign   . Unable to Pay for Housing in the Last Year: No   . Number of Times Moved in the Last Year: 0   . Homeless in the Last Year: No   Family History  Problem Relation Name Age of Onset  . Kidney disease Mother    . No Known Problems Father    . Diabetes type II Brother      Physical Exam: BP 139/85   Pulse 108   Temp 36.2 C (97.2 F)   Ht 154.9 cm (5' 1)   Wt 55.8 kg (123 lb)   LMP  (LMP Unknown)   BMI 23.24 kg/m   General: Elderly white woman sitting in patient transport chair. Well groomed, no acute distress. Non-toxic appearance.  HEENT: Conjunctivae  normal.  Pulmonary: Normal effort of breathing. Symmetric chest expansion.  Musculoskeletal: Tenderness to palpation of right PIPs. Stable ulnar deviation, no ulcerations or maceration to interdigital skin. Positive squeeze tests to hands and feet. No other tender, synovitic, warm, erythematous, or deformed joints. FROM all joints except as above.  Skin: Skin warm and dry. No rashes appreciable. Neurologic: Oriented to time, person, place, and situation. Normal gait.  Psychological: Normal behavior, thought content, and judgment. Records were reviewed No results found for: RF, CCP Lab Results  Component Value Date/Time   SEDRATE 18 11/12/2022 09:52 AM   Lab Results  Component Value Date/Time   GLUCOSE 133 (H) 04/17/2021 03:28 PM   GLUCOSE 123 09/25/2008 10:15 AM   BUN 9 04/17/2021 03:28  PM   CREATININE 0.7 11/12/2022 09:52 AM   CREATININE 0.8 09/25/2008 10:15 AM   NA 133 (L) 04/17/2021 03:28 PM   NA 133 (L) 09/25/2008 10:15 AM   K 4.4 04/17/2021 03:28 PM   K 3.8 09/25/2008 10:15 AM   CL 97 04/17/2021 03:28 PM   CL 100 09/25/2008 10:15 AM   CO2 31.0 04/17/2021 03:28 PM   CALCIUM  8.8 04/17/2021 03:28 PM   CALCIUM  9.1 09/25/2008 10:15 AM   ALB 3.8 11/12/2022 09:52 AM   AGRATIO 1.7 10/31/2020 09:33 AM   ALKPHOS 57 11/12/2022 09:52 AM   AST 17 11/12/2022 09:52 AM   ALT 13 11/12/2022 09:52 AM   Lab Results  Component Value Date/Time   WBC 12.7 (H) 11/12/2022 09:52 AM   WBC 8.6 09/25/2008 10:15 AM   RBC 3.25 (L) 11/12/2022 09:52 AM   RBC 4.46 09/25/2008 10:15 AM   HGB 9.0 (L) 11/12/2022 09:52 AM   HGB 11.6 (L) 09/25/2008 10:15 AM   HCT 29.0 (L) 11/12/2022 09:52 AM   HCT 0.35 09/25/2008 10:15 AM   MCV 89.2 11/12/2022 09:52 AM   MCH 27.7 11/12/2022 09:52 AM   MCHC 31.0 (L) 11/12/2022 09:52 AM   PLT 483 (H) 11/12/2022 09:52 AM   PLT 391 09/25/2008 10:15 AM    Failed to redirect to the Timeline version of the REVFS SmartLink. CDAI is calculated as follows:  SDAI = SJC +  TJC +PGA + EGA Where: 2 + 9 + 5 + 4 = 20  SJC = Swollen Joint Count TJC = Tender Joint Count PGA = Patient Global Assessment of Disease Activity EGA = Evaluator Global Assessment of Disease Activity  Interpretation <=2.8: Remission >2.8 and <=10: Low Disease Activity >10 and <=22: Moderate Disease Activity >22: High Disease Activity  Assessment and Plan: Rheumatoid arthritis of multiple sites with negative rheumatoid factor (CMS/HHS-HCC)  (primary encounter diagnosis) Plan: Hemoglobin A1C, predniSONE  (DELTASONE ) 5 MG        tablet  Disease modifying antirheumatic drug declined  Noncompliance  Long term current use of systemic steroids Plan: Hemoglobin A1C  Encounter for screening for diabetes mellitus Plan: Hemoglobin A1C  1. Rheumatoid arthritis-severe Stage 3.  Stable and Poorly controlled. She continues to act against medical advice regarding DMARD therapy but remains interested in prednisone  so we will continue her chronic prednisone  5mg  daily, given she is willing to proceed with necessary serologic monitoring today with an A1c check.   2. Long-term use of systemic steroids - I examined the patient and assessed for side effects. Recent CBC reviewed, and we will check an A1c today.   Return in about 6 months (around 06/27/2024).     Medication List      * Accurate as of December 28, 2023 11:01 AM. If you have any questions, ask your nurse or doctor.        CONTINUE taking these medications   albuterol  MDI (PROVENTIL , VENTOLIN , PROAIR ) HFA 90 mcg/actuation inhaler Inhale 2 inhalations into the lungs every 6 (six) hours as needed   cholecalciferol  2,000 unit capsule Commonly known as: VITAMIN D3   cyclobenzaprine  5 MG tablet Commonly known as: FLEXERIL  TAKE 1 TABLET BY MOUTH THREE TIMES DAILY AS NEEDED (MAY TAKE ONE-HALF TABLET INSTEAD)   dexlansoprazole 60 mg DR capsule Commonly known as: DEXILANT Take 1 capsule (60 mg total) by mouth once daily    fluticasone  propionate 50 mcg/actuation nasal spray Commonly known as: FLONASE    GINGER ROOT-PYRIDOXINE HCL(B6) ORAL   INTEGRA 125-40-3  mg Cap Generic drug: iron  fum,ps cmplx-vit C-niacin Take 1 capsule by mouth once daily   magnesium  oxide 400 mg magnesium  Tab   mometasone  0.1 % lotion Commonly known as: ELOCON    ondansetron  4 MG disintegrating tablet Commonly known as: ZOFRAN -ODT Take 1 tablet (4 mg total) by mouth every 8 (eight) hours as needed for Nausea   predniSONE  5 MG tablet Commonly known as: DELTASONE  Take 1 tablet (5 mg total) by mouth once daily for 180 days   sodium chloride  0.9 % Spra   SYMBICORT 80-4.5 mcg/actuation inhaler Generic drug: budesonide -formoteroL  Inhale 2 puffs by mouth twice daily   traZODone  50 MG tablet Commonly known as: DESYREL  Take 1-2 tablets (50-100 mg total) by mouth at bedtime as needed for Sleep   TURMERIC ORAL   VITAMIN B-12 ORAL       Where to Get Your Medications    These medications were sent to [facility] [address] ([address]), [place] - [address]  [address] [place] ([place]) [place] [address]   Phone: [phone]  predniSONE  5 MG tablet    Orders Placed This Encounter  Procedures  . Hemoglobin A1C    All new prescription medications, changes in current prescription dosages, and sample medications were discussed with the patient, including patient education, medication name, use, dosage, potential side effects, drug interactions, consequences of not using/taking, and special instructions. Patient expressed understanding. No barriers to adherence. *Some images could not be shown.

## 2024-02-15 ENCOUNTER — Other Ambulatory Visit: Payer: Self-pay

## 2024-02-15 ENCOUNTER — Inpatient Hospital Stay

## 2024-02-15 ENCOUNTER — Inpatient Hospital Stay
Admission: EM | Admit: 2024-02-15 | Discharge: 2024-02-24 | DRG: 871 | Disposition: A | Attending: Internal Medicine | Admitting: Internal Medicine

## 2024-02-15 ENCOUNTER — Encounter: Payer: Self-pay | Admitting: Intensive Care

## 2024-02-15 DIAGNOSIS — R112 Nausea with vomiting, unspecified: Secondary | ICD-10-CM | POA: Diagnosis present

## 2024-02-15 DIAGNOSIS — J9611 Chronic respiratory failure with hypoxia: Secondary | ICD-10-CM

## 2024-02-15 DIAGNOSIS — G8929 Other chronic pain: Secondary | ICD-10-CM | POA: Diagnosis present

## 2024-02-15 DIAGNOSIS — S22000A Wedge compression fracture of unspecified thoracic vertebra, initial encounter for closed fracture: Secondary | ICD-10-CM

## 2024-02-15 DIAGNOSIS — J42 Unspecified chronic bronchitis: Secondary | ICD-10-CM | POA: Diagnosis not present

## 2024-02-15 DIAGNOSIS — E871 Hypo-osmolality and hyponatremia: Secondary | ICD-10-CM | POA: Diagnosis present

## 2024-02-15 DIAGNOSIS — A419 Sepsis, unspecified organism: Secondary | ICD-10-CM | POA: Diagnosis present

## 2024-02-15 DIAGNOSIS — M069 Rheumatoid arthritis, unspecified: Secondary | ICD-10-CM | POA: Diagnosis present

## 2024-02-15 DIAGNOSIS — H548 Legal blindness, as defined in USA: Secondary | ICD-10-CM | POA: Diagnosis not present

## 2024-02-15 DIAGNOSIS — D649 Anemia, unspecified: Secondary | ICD-10-CM | POA: Diagnosis not present

## 2024-02-15 DIAGNOSIS — M0579 Rheumatoid arthritis with rheumatoid factor of multiple sites without organ or systems involvement: Secondary | ICD-10-CM | POA: Diagnosis not present

## 2024-02-15 DIAGNOSIS — L0291 Cutaneous abscess, unspecified: Principal | ICD-10-CM

## 2024-02-15 DIAGNOSIS — J449 Chronic obstructive pulmonary disease, unspecified: Secondary | ICD-10-CM | POA: Diagnosis not present

## 2024-02-15 DIAGNOSIS — I5033 Acute on chronic diastolic (congestive) heart failure: Secondary | ICD-10-CM | POA: Diagnosis not present

## 2024-02-15 DIAGNOSIS — L02511 Cutaneous abscess of right hand: Secondary | ICD-10-CM | POA: Diagnosis present

## 2024-02-15 DIAGNOSIS — A4101 Sepsis due to Methicillin susceptible Staphylococcus aureus: Secondary | ICD-10-CM

## 2024-02-15 DIAGNOSIS — I10 Essential (primary) hypertension: Secondary | ICD-10-CM | POA: Diagnosis not present

## 2024-02-15 DIAGNOSIS — M549 Dorsalgia, unspecified: Secondary | ICD-10-CM | POA: Diagnosis present

## 2024-02-15 DIAGNOSIS — L0390 Cellulitis, unspecified: Secondary | ICD-10-CM

## 2024-02-15 DIAGNOSIS — E875 Hyperkalemia: Secondary | ICD-10-CM | POA: Diagnosis not present

## 2024-02-15 DIAGNOSIS — I5032 Chronic diastolic (congestive) heart failure: Secondary | ICD-10-CM | POA: Diagnosis present

## 2024-02-15 DIAGNOSIS — S22070A Wedge compression fracture of T9-T10 vertebra, initial encounter for closed fracture: Secondary | ICD-10-CM

## 2024-02-15 LAB — CBC WITH DIFFERENTIAL/PLATELET
Abs Immature Granulocytes: 0.3 K/uL — ABNORMAL HIGH (ref 0.00–0.07)
Basophils Absolute: 0.1 K/uL (ref 0.0–0.1)
Basophils Relative: 0 %
Eosinophils Absolute: 0 K/uL (ref 0.0–0.5)
Eosinophils Relative: 0 %
HCT: 33.8 % — ABNORMAL LOW (ref 36.0–46.0)
Hemoglobin: 10.7 g/dL — ABNORMAL LOW (ref 12.0–15.0)
Immature Granulocytes: 1 %
Lymphocytes Relative: 1 %
Lymphs Abs: 0.3 K/uL — ABNORMAL LOW (ref 0.7–4.0)
MCH: 28.7 pg (ref 26.0–34.0)
MCHC: 31.7 g/dL (ref 30.0–36.0)
MCV: 90.6 fL (ref 80.0–100.0)
Monocytes Absolute: 1 K/uL (ref 0.1–1.0)
Monocytes Relative: 4 %
Neutro Abs: 27.3 K/uL — ABNORMAL HIGH (ref 1.7–7.7)
Neutrophils Relative %: 94 %
Platelets: 431 K/uL — ABNORMAL HIGH (ref 150–400)
RBC: 3.73 MIL/uL — ABNORMAL LOW (ref 3.87–5.11)
RDW: 16.2 % — ABNORMAL HIGH (ref 11.5–15.5)
Smear Review: NORMAL
WBC: 28.9 K/uL — ABNORMAL HIGH (ref 4.0–10.5)
nRBC: 0 % (ref 0.0–0.2)

## 2024-02-15 LAB — COMPREHENSIVE METABOLIC PANEL WITH GFR
ALT: 32 U/L (ref 0–44)
AST: 22 U/L (ref 15–41)
Albumin: 4 g/dL (ref 3.5–5.0)
Alkaline Phosphatase: 83 U/L (ref 38–126)
Anion gap: 11 (ref 5–15)
BUN: 40 mg/dL — ABNORMAL HIGH (ref 8–23)
CO2: 28 mmol/L (ref 22–32)
Calcium: 9.5 mg/dL (ref 8.9–10.3)
Chloride: 89 mmol/L — ABNORMAL LOW (ref 98–111)
Creatinine, Ser: 0.81 mg/dL (ref 0.44–1.00)
GFR, Estimated: >60 mL/min (ref >60)
Glucose, Bld: 125 mg/dL — ABNORMAL HIGH (ref 70–99)
Potassium: 5.7 mmol/L — ABNORMAL HIGH (ref 3.5–5.1)
Sodium: 128 mmol/L — ABNORMAL LOW (ref 135–145)
Total Bilirubin: 0.4 mg/dL (ref 0.0–1.2)
Total Protein: 7 g/dL (ref 6.5–8.1)

## 2024-02-15 LAB — CBG MONITORING, ED: Glucose-Capillary: 134 mg/dL — ABNORMAL HIGH (ref 70–99)

## 2024-02-15 LAB — PROTIME-INR
INR: 1 (ref 0.8–1.2)
Prothrombin Time: 13.3 s (ref 11.4–15.2)

## 2024-02-15 LAB — APTT: aPTT: 33 s (ref 24–36)

## 2024-02-15 LAB — OSMOLALITY: Osmolality: 283 mosm/kg (ref 275–295)

## 2024-02-15 LAB — SEDIMENTATION RATE: Sed Rate: 44 mm/h — ABNORMAL HIGH (ref 0–30)

## 2024-02-15 LAB — MAGNESIUM: Magnesium: 2.5 mg/dL — ABNORMAL HIGH (ref 1.7–2.4)

## 2024-02-15 LAB — PHOSPHORUS: Phosphorus: 3 mg/dL (ref 2.5–4.6)

## 2024-02-15 LAB — GLUCOSE, CAPILLARY: Glucose-Capillary: 154 mg/dL — ABNORMAL HIGH (ref 70–99)

## 2024-02-15 LAB — PRO BRAIN NATRIURETIC PEPTIDE: Pro Brain Natriuretic Peptide: 885 pg/mL — ABNORMAL HIGH (ref <300.0)

## 2024-02-15 LAB — LACTIC ACID, PLASMA: Lactic Acid, Venous: 0.9 mmol/L (ref 0.5–1.9)

## 2024-02-15 MED ORDER — SODIUM CHLORIDE 0.9 % IV SOLN: INTRAVENOUS | Status: DC

## 2024-02-15 MED ORDER — VANCOMYCIN HCL IN DEXTROSE 1-5 GM/200ML-% IV SOLN
1000.0000 mg | Freq: Once | INTRAVENOUS | Status: AC
  Administered 2024-02-15: 1000 mg via INTRAVENOUS
  Filled 2024-02-15: qty 200

## 2024-02-15 MED ORDER — SODIUM CHLORIDE 0.9 % IV SOLN
2.0000 g | Freq: Once | INTRAVENOUS | Status: AC
  Administered 2024-02-15: 2 g via INTRAVENOUS
  Filled 2024-02-15: qty 20

## 2024-02-15 MED ORDER — CYCLOBENZAPRINE HCL 10 MG PO TABS: 5.0000 mg | ORAL_TABLET | Freq: Three times a day (TID) | ORAL | Status: DC | PRN

## 2024-02-15 MED ORDER — LIDOCAINE 5 % EX PTCH
1.0000 | MEDICATED_PATCH | CUTANEOUS | Status: DC
  Filled 2024-02-15 (×3): qty 1

## 2024-02-15 MED ORDER — IPRATROPIUM-ALBUTEROL 0.5-2.5 (3) MG/3ML IN SOLN: 3.0000 mL | RESPIRATORY_TRACT | Status: DC

## 2024-02-15 MED ORDER — ONDANSETRON HCL 4 MG/2ML IJ SOLN
4.0000 mg | Freq: Once | INTRAMUSCULAR | Status: AC
  Administered 2024-02-15: 4 mg via INTRAVENOUS
  Filled 2024-02-15: qty 2

## 2024-02-15 MED ORDER — INSULIN ASPART 100 UNIT/ML IV SOLN
10.0000 [IU] | Freq: Once | INTRAVENOUS | Status: AC
  Filled 2024-02-15: qty 0.1
  Filled 2024-02-15: qty 10

## 2024-02-15 MED ORDER — METHYLPREDNISOLONE SODIUM SUCC 125 MG IJ SOLR
125.0000 mg | Freq: Once | INTRAMUSCULAR | Status: AC
  Administered 2024-02-16: 125 mg via INTRAVENOUS
  Filled 2024-02-15: qty 2

## 2024-02-15 MED ORDER — MORPHINE SULFATE (PF) 2 MG/ML IV SOLN
1.0000 mg | INTRAVENOUS | Status: DC | PRN
  Administered 2024-02-16 – 2024-02-17 (×4): 1 mg via INTRAVENOUS
  Filled 2024-02-15 (×4): qty 1

## 2024-02-15 MED ORDER — VANCOMYCIN HCL 750 MG/150ML IV SOLN
750.0000 mg | INTRAVENOUS | Status: DC
  Administered 2024-02-16 – 2024-02-17 (×2): 750 mg via INTRAVENOUS
  Filled 2024-02-15 (×2): qty 150

## 2024-02-15 MED ORDER — ALBUTEROL SULFATE (2.5 MG/3ML) 0.083% IN NEBU
2.5000 mg | INHALATION_SOLUTION | RESPIRATORY_TRACT | Status: DC | PRN
  Administered 2024-02-15: 2.5 mg via RESPIRATORY_TRACT
  Filled 2024-02-15 (×2): qty 3

## 2024-02-15 MED ORDER — HYDRALAZINE HCL 20 MG/ML IJ SOLN
5.0000 mg | INTRAMUSCULAR | Status: DC | PRN
  Administered 2024-02-16 – 2024-02-18 (×2): 5 mg via INTRAVENOUS
  Filled 2024-02-15 (×4): qty 1

## 2024-02-15 MED ORDER — ONDANSETRON HCL 4 MG/2ML IJ SOLN
4.0000 mg | Freq: Three times a day (TID) | INTRAMUSCULAR | Status: DC | PRN
  Administered 2024-02-16 – 2024-02-17 (×2): 4 mg via INTRAVENOUS
  Filled 2024-02-15 (×3): qty 2

## 2024-02-15 MED ORDER — PREDNISONE 20 MG PO TABS
20.0000 mg | ORAL_TABLET | Freq: Every day | ORAL | Status: DC
  Administered 2024-02-16: 20 mg via ORAL
  Filled 2024-02-15: qty 1

## 2024-02-15 MED ORDER — DM-GUAIFENESIN ER 30-600 MG PO TB12: 1.0000 | ORAL_TABLET | Freq: Two times a day (BID) | ORAL | Status: DC | PRN

## 2024-02-15 MED ORDER — IPRATROPIUM-ALBUTEROL 0.5-2.5 (3) MG/3ML IN SOLN
3.0000 mL | Freq: Four times a day (QID) | RESPIRATORY_TRACT | Status: DC
  Administered 2024-02-16: 3 mL via RESPIRATORY_TRACT
  Filled 2024-02-15 (×2): qty 3

## 2024-02-15 MED ORDER — PANTOPRAZOLE SODIUM 40 MG PO TBEC
40.0000 mg | DELAYED_RELEASE_TABLET | Freq: Every day | ORAL | Status: DC
  Administered 2024-02-16 – 2024-02-24 (×9): 40 mg via ORAL
  Filled 2024-02-15 (×9): qty 1

## 2024-02-15 MED ORDER — DEXTROSE 50 % IV SOLN
50.0000 mL | Freq: Once | INTRAVENOUS | Status: AC
  Filled 2024-02-15: qty 50

## 2024-02-15 MED ORDER — SODIUM ZIRCONIUM CYCLOSILICATE 10 G PO PACK
10.0000 g | PACK | Freq: Once | ORAL | Status: AC
  Administered 2024-02-16: 10 g via ORAL
  Filled 2024-02-15 (×2): qty 1

## 2024-02-15 MED ORDER — FE FUM-VIT C-VIT B12-FA 460-60-0.01-1 MG PO CAPS
1.0000 | ORAL_CAPSULE | Freq: Every day | ORAL | Status: DC
  Administered 2024-02-16 – 2024-02-24 (×9): 1 via ORAL
  Filled 2024-02-15 (×10): qty 1

## 2024-02-15 MED ORDER — TRAZODONE HCL 100 MG PO TABS
100.0000 mg | ORAL_TABLET | Freq: Every evening | ORAL | Status: DC | PRN
  Filled 2024-02-15: qty 1

## 2024-02-15 MED ORDER — INTEGRA 62.5-62.5-40-3 MG PO CAPS: 1.0000 | ORAL_CAPSULE | Freq: Every day | ORAL | Status: DC

## 2024-02-15 MED ORDER — HYDROMORPHONE HCL 1 MG/ML IJ SOLN
0.5000 mg | Freq: Once | INTRAMUSCULAR | Status: AC
  Administered 2024-02-15: 0.5 mg via INTRAVENOUS
  Filled 2024-02-15: qty 0.5

## 2024-02-15 MED ORDER — LIDOCAINE HCL (PF) 1 % IJ SOLN
5.0000 mL | Freq: Once | INTRAMUSCULAR | Status: AC
  Administered 2024-02-15: 5 mL
  Filled 2024-02-15: qty 5

## 2024-02-15 MED ORDER — SODIUM CHLORIDE 0.9 % IV SOLN
2.0000 g | INTRAVENOUS | Status: DC
  Administered 2024-02-16 – 2024-02-17 (×2): 2 g via INTRAVENOUS
  Filled 2024-02-15 (×3): qty 20

## 2024-02-15 MED ORDER — SODIUM CHLORIDE 1 G PO TABS
1.0000 g | ORAL_TABLET | Freq: Two times a day (BID) | ORAL | Status: DC
  Administered 2024-02-15 – 2024-02-16 (×3): 1 g via ORAL
  Filled 2024-02-15 (×3): qty 1

## 2024-02-15 MED ORDER — OXYCODONE-ACETAMINOPHEN 5-325 MG PO TABS
1.0000 | ORAL_TABLET | ORAL | Status: DC | PRN
  Administered 2024-02-16: 1 via ORAL
  Filled 2024-02-15 (×3): qty 1

## 2024-02-15 MED ORDER — ACETAMINOPHEN 325 MG PO TABS
650.0000 mg | ORAL_TABLET | Freq: Four times a day (QID) | ORAL | Status: DC | PRN
  Administered 2024-02-19 – 2024-02-22 (×5): 650 mg via ORAL
  Filled 2024-02-15 (×5): qty 2

## 2024-02-15 NOTE — ED Notes (Addendum)
 Educated patient on medications ordered to reduce pain, lower blood pressure, and lower her potassium level that is currently high. PT states she would like to speak with the provider. Dr. Hilma made aware.

## 2024-02-15 NOTE — H&P (Incomplete)
 History and Physical    Ashley Mack FMW:[id number] DOB: August 21, 1945 DOA: 02/15/2024  Referring MD/NP/PA:   PCP: Lenon Layman ORN, MD   Patient coming from:  The patient is coming from home.     Chief Complaint: Right hand swelling, redness and pain, nausea, vomiting  HPI: Ashley Mack is a 78 y.o. 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  Patient states that she possibly had an allergic reaction.  She states she has chronic back pain and has been taking gabapentin.  She states recently she came off gabapentin and started tramadol . Over the last week or so she has noticed increase in swelling in her lower legs and feet. She also noticed swelling and pain in right hand. Her right hand is erythematous.  She denies any injury.  No fever or chills. She also reports nausea and multiple episodes of vomiting.  Denies abdominal pain or diarrhea.  No symptoms of UTI.  Patient reports mild dry cough and SOB due to COPD which has not changed significantly.  ED physician did bedside ultrasound of her right hand, found to have right hand dorsal abscess, 10-12 cc of drainage is removed and sent out for culture.  Data reviewed independently and ED Course: pt was found to have WBC 23.9 (baseline WBC 12-15), lactic acid 0.9, potassium 5.7, sodium 128, magnesium  2.5, phosphorus 3.0, GFR> 60, proBNP 885, liver function normal.  Temperature normal, blood pressure 197/91 --> 118/87, heart rate 110s, RR 20, oxygen saturation 87% on room air, which improved to 97% on 2 L home level oxygen.  Patient is admitted to telemetry bed as inpatient.  X-ray of right hand: 1. No acute osseous abnormality related to the abscess of the right hand. 2. Dorsal soft tissue  swelling.  CT abdomen/pelvis: pending    EKG: Not done in ED, will get one.    Review of Systems:   General: no fevers, chills, no body weight gain, has poor appetite, has fatigue HEENT: no blurry vision, hearing changes or sore throat Respiratory: has dyspnea, coughing, no wheezing CV: no chest pain, no palpitations GI: has nausea, vomiting, no abdominal pain, diarrhea, constipation GU: no dysuria, burning on urination, increased urinary frequency, hematuria  Ext: has leg edema.  Has right hand swelling and pain Neuro: no unilateral weakness, numbness, or tingling, no vision change or hearing loss Skin: no rash, no skin tear. MSK: No muscle spasm, has finger deformity, no limitation of range of movement in spin. Has chronic low back pain Heme: No easy bruising.  Travel history: No recent long distant travel.   Allergy:  Allergies  Allergen Reactions   Methotrexate And Trimetrexate Nausea And Vomiting and Other (See Comments)    Flu like symptoms   Other Nausea Only and Other (See Comments)    Arthritis medications . Unsure of what the med was.   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    Iron  (Fe) deficiency anemia    Migraine headache    in past   Migraines    Motion sickness    No natural teeth    Osteoporosis    Rheumatoid arthritis (HCC)    Vertigo     Past Surgical History:  Procedure Laterality Date   CATARACT EXTRACTION W/PHACO Right 08/12/2016   Procedure: CATARACT EXTRACTION PHACO AND INTRAOCULAR LENS PLACEMENT (IOC)   Right;  Surgeon: Mittie Gaskin, MD;  Location: [facility];  Service: Ophthalmology;  Laterality: Right;   CATARACT EXTRACTION W/PHACO Left 09/30/2016   Procedure: CATARACT EXTRACTION PHACO AND INTRAOCULAR LENS PLACEMENT (IOC) Left;  Surgeon: Mittie Gaskin, MD;  Location: [facility];  Service: Ophthalmology;  Laterality: Left;   COLONOSCOPY     COLONOSCOPY WITH PROPOFOL  N/A 12/09/2016   Procedure: COLONOSCOPY WITH PROPOFOL ;  Surgeon: Toledo, Ladell POUR, MD;  Location: [facility];  Service: Endoscopy;  Laterality: N/A;   ESOPHAGOGASTRODUODENOSCOPY     ESOPHAGOGASTRODUODENOSCOPY (EGD) WITH PROPOFOL  N/A 12/09/2016   Procedure: ESOPHAGOGASTRODUODENOSCOPY (EGD) WITH PROPOFOL ;  Surgeon: Toledo, Ladell POUR, MD;  Location: [facility];  Service: Endoscopy;  Laterality: N/A;   KYPHOPLASTY N/A 08/10/2017   Procedure: LYNN;  Surgeon: Kathlynn Sharper, MD;  Location: [facility];  Service: Orthopedics;  Laterality: N/A;   KYPHOPLASTY N/A 09/09/2017   Procedure: XBEYNEOJDUB-O8,O5;  Surgeon: Kathlynn Sharper, MD;  Location: [facility];  Service: Orthopedics;  Laterality: N/A;   KYPHOPLASTY N/A 09/28/2017   Procedure: XBEYNEOJDUB-U88;  Surgeon: Kathlynn Sharper, MD;  Location: [facility];  Service: Orthopedics;  Laterality: N/A;   KYPHOPLASTY N/A 06/19/2019   Procedure: KYPHOPLASTY T5;  Surgeon: Kathlynn Sharper, MD;  Location: [facility];  Service: Orthopedics;  Laterality: N/A;   OOPHORECTOMY Bilateral 2003   RIGHT/LEFT HEART CATH AND CORONARY ANGIOGRAPHY Bilateral 10/19/2019   Procedure: RIGHT/LEFT HEART CATH AND CORONARY ANGIOGRAPHY;  Surgeon: Florencio Cara BIRCH, MD;  Location: [facility];  Service: Cardiovascular;  Laterality: Bilateral;    Social History:  reports that she quit smoking about 16 years ago. Her smoking use included cigarettes. She has never used smokeless tobacco. She reports that she does not drink alcohol and does not use drugs.  Family History:  Family  History  Problem Relation Age of Onset   Kidney disease Mother    Diabetes Mellitus II Brother      Prior to Admission medications   Medication Sig Start Date End Date Taking? Authorizing Provider  albuterol  (VENTOLIN  HFA) 108 (90 Base) MCG/ACT inhaler Inhale 1-2 puffs into the lungs every 4 (four) hours as needed for wheezing or shortness of breath. 06/01/23   Mortenson, Ashley, MD  budesonide -formoterol  (SYMBICORT) 160-4.5 MCG/ACT inhaler Inhale 2 puffs into the lungs 2 (two) times daily.    [provider]  Cholecalciferol  (VITAMIN D3) 2000 UNITS capsule Take 2,000 Units by mouth daily.     [provider]  cyclobenzaprine  (FLEXERIL ) 5 MG tablet Take 5 mg by mouth 2 (two) times daily.    [provider]  dexlansoprazole (DEXILANT) 60 MG capsule Take 60 mg by mouth daily.    [provider]  diclofenac  Sodium (VOLTAREN ) 1 % GEL Apply 2 g topically as needed.    [provider]  Fe Fum-FePoly-Vit C-Vit B3 (INTEGRA) 62.5-62.5-40-3 MG CAPS Take 1 capsule by mouth daily. 11/29/13  [provider]  fluticasone  (FLONASE ) 50 MCG/ACT nasal spray Place into both nostrils daily.    [provider]  ondansetron  (ZOFRAN -ODT) 4 MG disintegrating tablet Take 4 mg by mouth. 10/21/23   [provider]  predniSONE  (DELTASONE ) 5 MG tablet Take 5 mg by mouth daily.     [provider]  Spacer/Aero-Holding Chambers (AEROCHAMBER MV) inhaler Use as instructed 06/01/23   Van Knee, MD  traZODone  (DESYREL ) 50 MG tablet Take 50-100 mg by mouth. 11/18/23 11/17/24  [provider]  Turmeric (QC TUMERIC COMPLEX) 500 MG CAPS Take 1 tablet by mouth daily.    [provider]    Physical Exam: Vitals:   02/15/24 1857 02/15/24 2000 02/15/24 2100 02/15/24 2200  BP:  (!) 197/91 (!) 191/113 118/87  Pulse:  (!) 110 (!) 110 (!) 109  Resp:  18 (!) 25 (!) 25  Temp:      TempSrc:      SpO2: 95% 97% 96% 96%  Weight:       Height:       General: Not in acute distress HEENT:       Eyes: has blindness, no jaundice       ENT: No discharge from the ears and nose, no pharynx injection, no tonsillar enlargement.        Neck: No JVD, no bruit, no mass felt. Heme: No neck lymph node enlargement. Cardiac: S1/S2, RRR, No murmurs, No gallops or rubs. Respiratory: has decreased air movement bilaterally, has very mild wheezing bilaterally GI: Mildly distended, nontender, no rebound pain, no organomegaly, BS present. GU: No hematuria Ext: has 2+ pitting leg edema bilaterally. 1+DP/PT pulse bilaterally. Has tenderness, erythema, swelling, warmth over right hand.     Musculoskeletal: Has finger deformity Skin: No rashes.  Neuro: Alert, oriented X3, moves all extremities normally.  Psych: Patient is not psychotic, no suicidal or hemocidal ideation.  Labs on Admission: I have personally reviewed following labs and imaging studies  CBC: Recent Labs  Lab 02/15/24 1815  WBC 28.9*  NEUTROABS 27.3*  HGB 10.7*  HCT 33.8*  MCV 90.6  PLT 431*   Basic Metabolic Panel: Recent Labs  Lab 02/15/24 1815 02/15/24 1850  NA 128*  --   K 5.7*  --   CL 89*  --   CO2 28  --   GLUCOSE 125*  --   BUN 40*  --   CREATININE 0.81  --   CALCIUM  9.5  --   MG  --  2.5*  PHOS  --  3.0   GFR: Estimated Creatinine Clearance: 43.8 mL/min (by C-G formula based on SCr of 0.81 mg/dL). Liver Function Tests: Recent Labs  Lab 02/15/24 1815  AST 22  ALT 32  ALKPHOS 83  BILITOT 0.4  PROT 7.0  ALBUMIN 4.0   Recent Labs  Lab 02/15/24 2323  LIPASE 22   No results for input(s): AMMONIA in the last 168 hours. Coagulation Profile: Recent Labs  Lab 02/15/24 2323  INR 1.0   Cardiac Enzymes: No results for input(s): CKTOTAL, CKMB, CKMBINDEX, TROPONINI in the last 168 hours. BNP (last 3 results) Recent Labs    02/15/24 1850  PROBNP 885.0*   HbA1C: No results for input(s): HGBA1C in the last 72  hours. CBG: Recent Labs  Lab 02/15/24 2209 02/15/24 2317  GLUCAP 134* 154*   Lipid Profile: No results for input(s): CHOL, HDL, LDLCALC, TRIG, CHOLHDL, LDLDIRECT in the last 72 hours. Thyroid  Function Tests: No results for input(s): TSH,  T4TOTAL, FREET4, T3FREE, THYROIDAB in the last 72 hours. Anemia Panel: No results for input(s): VITAMINB12, FOLATE, FERRITIN, TIBC, IRON , RETICCTPCT in the last 72 hours. Urine analysis:    Component Value Date/Time   COLORURINE STRAW (A) 08/06/2017 1121   APPEARANCEUR CLEAR (A) 08/06/2017 1121   LABSPEC 1.004 (L) 08/06/2017 1121   PHURINE 7.0 08/06/2017 1121   GLUCOSEU NEGATIVE 08/06/2017 1121   HGBUR SMALL (A) 08/06/2017 1121   BILIRUBINUR NEGATIVE 08/06/2017 1121   KETONESUR 20 (A) 08/06/2017 1121   PROTEINUR NEGATIVE 08/06/2017 1121   NITRITE NEGATIVE 08/06/2017 1121   LEUKOCYTESUR NEGATIVE 08/06/2017 1121   Sepsis Labs: @LABRCNTIP (procalcitonin:4,lacticidven:4) )No results found for this or any previous visit (from the past 240 hours).   Radiological Exams on Admission:   Assessment/Plan Principal Problem:   Abscess of right hand Active Problems:   Sepsis (HCC)   HTN (hypertension)   COPD (chronic obstructive pulmonary disease) (HCC)   Chronic diastolic CHF (congestive heart failure) (HCC)   Nausea & vomiting   Hyperkalemia   Hyponatremia   Normocytic anemia   Legal blindness   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HCC)   Chronic back pain   Assessment and Plan:  Sepsis due to abscess of right hand: Patient meets critical for sepsis with WBC 28.9 and heart rate 110s, lactic acid normal at 0.9. EDP did I&D.  - will admit to tele bed as inpatient - Empiric antimicrobial treatment with vancomycin and Rocephin  - PRN Zofran  for nausea, and Tylenol , morphine  and Percocet for pain - Blood cultures x 2  - ESR and CRP - IVF: will not give IVF since pt has 2+ leg edema, with risk of  developing CHF exacerbation - if not improving, may need to consult ortho for further I&D  HTN (hypertension): Bp 197/91 --> 91. Pt is not taking medications currently. - IV hydralazine  as needed  COPD (chronic obstructive pulmonary disease) (HCC): Patient has 2 L new oxygen requirement, has mild wheezing, indicating mild COPD exacerbation. - Patient is on broad antibiotics - Gave 125 mg of Solu-Medrol  which is also served as a stress dose of steroid (patient is taking chronic prednisone ) - Bronchodilators and as needed Mucinex  - Check RERP panel - Sputum culture - Incentive spirometry  Chronic diastolic CHF (congestive heart failure) [facility]): Patient has 2+ leg edema, proBNP 885, indicating fluid overload.  Since patient has hyponatremia and sepsis, will hold off diuretics. - Watch volume status closely  Nausea & vomiting: Etiology is not clear.  Lipase normal 22.  Liver function normal. -As needed Zofran  - Follow-up CT scan of abdomen/pelvis without contrast (patient does not want to take more medications)  Hyperkalemia: Potassium 5.7. -Get EKG - Give D50, NovoLog 10 units and 10g of Lokelma (pt initially refused these medications, but I spent a lengthy time with the patient to have explained the importance of treating hyperkalemia, finally convinced the patient to have agreed using these medications).  Hyponatremia: Sodium 128, mental status normal, likely due to nausea vomiting - Will not give normal saline due to risk of CHF exacerbation - Fluid restriction - Sodium chloride  1 g twice daily - Will check urine sodium, urine osmolality, serum osmolality. - f/u with BMP  Normocytic anemia: Hemoglobin 10.7 (11.79//25) - Follow-up with CBC - Continue iron  supplement  Legal blindness -Fall precaution  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HCC) -Increase prednisone  dose from 5 to 20 mg daily - Give 125 mg Solu-Medrol  as stress dose which is also for COPD  Chronic back  pain: - Pain control as above - Lidoderm  patch - As needed Flexeril        DVT ppx: SCD  Code Status: DNR (I discussed with patient and explained the meaning of CODE STATUS. Patient wants to be DNR)  Family Communication:     not done, no family member is at bed side.   Disposition Plan:  Anticipate discharge back to previous environment  Consults called:  none  Admission status and Level of care: Telemetry:    for obs as inpt        Dispo: The patient is from: Home              Anticipated d/c is to: Home              Anticipated d/c date is: 2 days              Patient currently is not medically stable to d/c.    Severity of Illness:  The appropriate patient status for this patient is INPATIENT. Inpatient status is judged to be reasonable and necessary in order to provide the required intensity of service to ensure the patient's safety. The patient's presenting symptoms, physical exam findings, and initial radiographic and laboratory data in the context of their chronic comorbidities is felt to place them at high risk for further clinical deterioration. Furthermore, it is not anticipated that the patient will be medically stable for discharge from the hospital within 2 midnights of admission.   * I certify that at the point of admission it is my clinical judgment that the patient will require inpatient hospital care spanning beyond 2 midnights from the point of admission due to high intensity of service, high risk for further deterioration and high frequency of surveillance required.*       Date of Service 02/16/2024    Caleb Exon [facility]   If 7PM-7AM, please contact night-coverage [email or web address] 02/16/2024, 12:33 AM

## 2024-02-15 NOTE — ED Triage Notes (Signed)
 Recently seen at ortho and given gabapentin for back and nerve pain. Took medicine November 21st- 30th. Patient reports swelling in right arm and bilateral legs/feet. Patient has also been having emesis.

## 2024-02-15 NOTE — ED Provider Notes (Addendum)
 [facility] Provider Note    Event Date/Time   First MD Initiated Contact with Patient 02/15/24 1850     (approximate)  History   Chief Complaint: Allergic Reaction  HPI  Ashley Mack is a 78 y.o. female with a past medical history of anemia, anxiety, gastric reflux, rheumatoid arthritis, legally blind, presents to the emergency department for worsening swelling in her feet as well as pain and swelling of the right arm.  According to the patient over the last several weeks she has been experiencing worsening pain in her back.  Patient states she has chronic back pain and has been taking gabapentin.  She states recently she came off gabapentin and started tramadol .  Patient states over the last week or so she has noticed slight increase in swelling in her feet and has noticed a significant increase in pain in the right arm with swelling noted.  Arm is noted to be very red however patient has not noted as she cannot see.  No known fever.  Afebrile but tachycardic to 115.  Patient states significant nausea has not been able to tolerate much by mouth over the last 2 days.  Physical Exam   Triage Vital Signs: ED Triage Vitals  Encounter Vitals Group     BP 02/15/24 1837 (!) 164/77     Girls Systolic BP Percentile --      Girls Diastolic BP Percentile --      Boys Systolic BP Percentile --      Boys Diastolic BP Percentile --      Pulse Rate 02/15/24 1837 (!) 115     Resp 02/15/24 1837 20     Temp 02/15/24 1837 98.4 F (36.9 C)     Temp Source 02/15/24 1837 Oral     SpO2 02/15/24 1837 98 %     Weight 02/15/24 1834 123 lb (55.8 kg)     Height 02/15/24 1834 5' 2 (1.575 m)     Head Circumference --      Peak Flow --      Pain Score 02/15/24 1834 10     Pain Loc --      Pain Education --      Exclude from Growth Chart --     Most recent vital signs: Vitals:   02/15/24 1845 02/15/24 1857  BP:    Pulse:    Resp:    Temp:    SpO2: (!) 87% 95%     General: Awake, no distress.  CV:  Good peripheral perfusion.  Regular rate and rhythm  Resp:  Normal effort.  Equal breath sounds bilaterally.  Abd:  No distention.   Other:  Patient has 1+ lower extremity edema bilaterally no erythema or tenderness.  Patient has significant tenderness to the right upper extremity overlying the dorsal aspect of the hand where she has significant swelling erythema extending halfway up the forearm.  There is a area of fluctuance to the dorsal aspect of the hand concerning for abscess.   ED Results / Procedures / Treatments   MEDICATIONS ORDERED IN ED: Medications  cefTRIAXone  (ROCEPHIN ) 2 g in sodium chloride  0.9 % 100 mL IVPB (has no administration in time range)  vancomycin (VANCOCIN) IVPB 1000 mg/200 mL premix (has no administration in time range)  HYDROmorphone  (DILAUDID ) injection 0.5 mg (0.5 mg Intravenous Given 02/15/24 1926)  ondansetron  (ZOFRAN ) injection 4 mg (4 mg Intravenous Given 02/15/24 1926)  lidocaine  (PF) (XYLOCAINE ) 1 % injection 5 mL (5 mLs  Infiltration Given by Other 02/15/24 1926)     IMPRESSION / MDM / ASSESSMENT AND PLAN / ED COURSE  I reviewed the triage vital signs and the nursing notes.  Patient's presentation is most consistent with acute presentation with potential threat to life or bodily function.  Patient presents emergency department with concerns of worsening swelling in her feet right arm with worsening pain and redness of the right hand.  Patient states generalized weakness she is tachycardic to 115 bpm.  Lab work today shows hyponatremia but largely unchanged from baseline otherwise reassuring chemistry.  Patient CBC shows significant leukocytosis of 28,000.  Patient has a history of some mild leukocytosis her baseline white blood cell count tends to run between 12 and 15,000 but has never been this elevated previously at least in my review of the patient's records.  I was able to use a bedside ultrasound and patient  appears to have significant abscess to the dorsal aspect of the right hand.  I was able to numb this area incised and drained approximately 10 to 12 cc of exudative drainage.  Given the patient's significant leukocytosis and tachycardia with abscess and signs of surrounding cellulitis with erythema extending up the forearm we will admit to the hospital service for IV antibiotics.  Patient agreeable to plan of care.  INCISION AND DRAINAGE Performed by: Franky Moores Consent: Verbal consent obtained. Risks and benefits: risks, benefits and alternatives were discussed Type: abscess  Body area: Dorsal aspect of right hand  Anesthesia: local infiltration  Incision was made with a scalpel.  Local anesthetic: lidocaine  1% without epinephrine   Anesthetic total: 3 ml  Complexity: complex Blunt dissection to break up loculations  Drainage: purulent  Drainage amount: 10-15 cc  Patient tolerance: Patient tolerated the procedure well with no immediate complications.    FINAL CLINICAL IMPRESSION(S) / ED DIAGNOSES   Cellulitis Abscess   Note:  This document was prepared using Dragon voice recognition software and may include unintentional dictation errors.   Moores Franky, MD 02/15/24 2025    Moores Franky, MD 02/15/24 7972    Moores Franky, MD 02/15/24 2131

## 2024-02-15 NOTE — ED Notes (Signed)
Dr. Blaine Hamper at bedside speaking with patient.

## 2024-02-15 NOTE — Progress Notes (Signed)
 Pharmacy Antibiotic Note  Ashley Mack is a 78 y.o. female admitted on 02/15/2024 with wound infection.  Pharmacy has been consulted for Vancomycin dosing.  Plan: Vancomycin 1 gm IV X 1 given in ED on 12/2 @ 2143. Vancomycin 750 mg IV Q24H ordered to start on 12/3 @ 2200.  AUC = 458 Vanc trough = 29.9   Height: 5' 2 (157.5 cm) Weight: 55.8 kg (123 lb) IBW/kg (Calculated) : 50.1  Temp (24hrs), Avg:98.4 F (36.9 C), Min:98.4 F (36.9 C), Max:98.4 F (36.9 C)  Recent Labs  Lab 02/15/24 1815 02/15/24 2049  WBC 28.9*  --   CREATININE 0.81  --   LATICACIDVEN  --  0.9    Estimated Creatinine Clearance: 43.8 mL/min (by C-G formula based on SCr of 0.81 mg/dL).    Allergies  Allergen Reactions   Methotrexate And Trimetrexate Nausea And Vomiting and Other (See Comments)    Flu like symptoms   Other Nausea Only and Other (See Comments)    Arthritis medications . Unsure of what the med was.   Gabapentin    Ranitidine Hives   Sulfasalazine Other (See Comments)    Stomach upset   Topiramate Nausea Only    Antimicrobials this admission:   >>    >>   Dose adjustments this admission:   Microbiology results:  BCx:   UCx:    Sputum:    MRSA PCR:   Thank you for allowing pharmacy to be a part of this patient's care.  Seeley Southgate D 02/15/2024 10:53 PM

## 2024-02-16 ENCOUNTER — Inpatient Hospital Stay

## 2024-02-16 DIAGNOSIS — J449 Chronic obstructive pulmonary disease, unspecified: Secondary | ICD-10-CM

## 2024-02-16 DIAGNOSIS — A419 Sepsis, unspecified organism: Secondary | ICD-10-CM

## 2024-02-16 DIAGNOSIS — I5033 Acute on chronic diastolic (congestive) heart failure: Secondary | ICD-10-CM

## 2024-02-16 DIAGNOSIS — J439 Emphysema, unspecified: Secondary | ICD-10-CM | POA: Diagnosis not present

## 2024-02-16 DIAGNOSIS — I10 Essential (primary) hypertension: Secondary | ICD-10-CM

## 2024-02-16 DIAGNOSIS — L02511 Cutaneous abscess of right hand: Secondary | ICD-10-CM

## 2024-02-16 DIAGNOSIS — M549 Dorsalgia, unspecified: Secondary | ICD-10-CM | POA: Diagnosis not present

## 2024-02-16 DIAGNOSIS — J9611 Chronic respiratory failure with hypoxia: Secondary | ICD-10-CM

## 2024-02-16 DIAGNOSIS — M069 Rheumatoid arthritis, unspecified: Secondary | ICD-10-CM

## 2024-02-16 DIAGNOSIS — R112 Nausea with vomiting, unspecified: Secondary | ICD-10-CM

## 2024-02-16 LAB — BASIC METABOLIC PANEL WITH GFR
Anion gap: 13 (ref 5–15)
BUN: 37 mg/dL — ABNORMAL HIGH (ref 8–23)
CO2: 27 mmol/L (ref 22–32)
Calcium: 9.2 mg/dL (ref 8.9–10.3)
Chloride: 91 mmol/L — ABNORMAL LOW (ref 98–111)
Creatinine, Ser: 0.76 mg/dL (ref 0.44–1.00)
GFR, Estimated: >60 mL/min (ref >60)
Glucose, Bld: 100 mg/dL — ABNORMAL HIGH (ref 70–99)
Potassium: 4.7 mmol/L (ref 3.5–5.1)
Sodium: 131 mmol/L — ABNORMAL LOW (ref 135–145)

## 2024-02-16 LAB — LACTIC ACID, PLASMA: Lactic Acid, Venous: 1.4 mmol/L (ref 0.5–1.9)

## 2024-02-16 LAB — C-REACTIVE PROTEIN: CRP: 19.8 mg/dL — ABNORMAL HIGH (ref <1.0)

## 2024-02-16 LAB — RESP PANEL BY RT-PCR (RSV, FLU A&B, COVID)  RVPGX2
Influenza A by PCR: NEGATIVE
Influenza B by PCR: NEGATIVE
Resp Syncytial Virus by PCR: NEGATIVE
SARS Coronavirus 2 by RT PCR: NEGATIVE

## 2024-02-16 LAB — LIPASE, BLOOD: Lipase: 22 U/L (ref 11–51)

## 2024-02-16 LAB — CBC
HCT: 31.8 % — ABNORMAL LOW (ref 36.0–46.0)
Hemoglobin: 10.2 g/dL — ABNORMAL LOW (ref 12.0–15.0)
MCH: 28.8 pg (ref 26.0–34.0)
MCHC: 32.1 g/dL (ref 30.0–36.0)
MCV: 89.8 fL (ref 80.0–100.0)
Platelets: 437 K/uL — ABNORMAL HIGH (ref 150–400)
RBC: 3.54 MIL/uL — ABNORMAL LOW (ref 3.87–5.11)
RDW: 16.2 % — ABNORMAL HIGH (ref 11.5–15.5)
WBC: 29.9 K/uL — ABNORMAL HIGH (ref 4.0–10.5)
nRBC: 0 % (ref 0.0–0.2)

## 2024-02-16 LAB — GLUCOSE, CAPILLARY
Glucose-Capillary: 109 mg/dL — ABNORMAL HIGH (ref 70–99)
Glucose-Capillary: 271 mg/dL — ABNORMAL HIGH (ref 70–99)

## 2024-02-16 MED ORDER — DICLOFENAC SODIUM 1 % EX GEL
2.0000 g | Freq: Four times a day (QID) | CUTANEOUS | Status: DC | PRN
  Administered 2024-02-18 – 2024-02-22 (×5): 2 g via TOPICAL
  Filled 2024-02-16: qty 100

## 2024-02-16 MED ORDER — DILTIAZEM HCL ER COATED BEADS 120 MG PO CP24
120.0000 mg | ORAL_CAPSULE | Freq: Every day | ORAL | Status: DC
  Administered 2024-02-16: 120 mg via ORAL
  Filled 2024-02-16 (×3): qty 1

## 2024-02-16 MED ORDER — INSULIN ASPART 100 UNIT/ML IJ SOLN
INTRAMUSCULAR | Status: AC
  Administered 2024-02-16: 10 [IU] via INTRAVENOUS
  Filled 2024-02-16: qty 10

## 2024-02-16 MED ORDER — CALCITONIN (SALMON) 200 UNIT/ACT NA SOLN
1.0000 | Freq: Every day | NASAL | Status: DC
  Administered 2024-02-16 – 2024-02-24 (×9): 1 via NASAL
  Filled 2024-02-16: qty 3.7

## 2024-02-16 MED ORDER — DEXTROSE 50 % IV SOLN
INTRAVENOUS | Status: AC
  Administered 2024-02-16: 50 mL via INTRAVENOUS
  Filled 2024-02-16: qty 50

## 2024-02-16 MED ORDER — SALINE SPRAY 0.65 % NA SOLN
1.0000 | NASAL | Status: DC | PRN
  Administered 2024-02-18 – 2024-02-23 (×6): 1 via NASAL
  Filled 2024-02-16 (×2): qty 44

## 2024-02-16 MED ORDER — IPRATROPIUM-ALBUTEROL 0.5-2.5 (3) MG/3ML IN SOLN
3.0000 mL | Freq: Three times a day (TID) | RESPIRATORY_TRACT | Status: DC
  Filled 2024-02-16: qty 3

## 2024-02-16 MED ORDER — FLUTICASONE FUROATE-VILANTEROL 100-25 MCG/ACT IN AEPB
1.0000 | INHALATION_SPRAY | Freq: Every day | RESPIRATORY_TRACT | Status: DC
  Administered 2024-02-16 – 2024-02-24 (×9): 1 via RESPIRATORY_TRACT
  Filled 2024-02-16: qty 28

## 2024-02-16 MED ORDER — ALBUTEROL SULFATE HFA 108 (90 BASE) MCG/ACT IN AERS
1.0000 | INHALATION_SPRAY | RESPIRATORY_TRACT | Status: AC | PRN
  Administered 2024-02-16: 2 via RESPIRATORY_TRACT
  Filled 2024-02-16: qty 6.7

## 2024-02-16 NOTE — Assessment & Plan Note (Signed)
 Potassium normal range

## 2024-02-16 NOTE — Plan of Care (Signed)

## 2024-02-16 NOTE — Progress Notes (Signed)
 MEWS Progress Note  Patient Details Name: Ashley Mack MRN: [id number] DOB: 1946/02/06 Today's Date: 02/16/2024   MEWS Flowsheet Documentation:  Assess: MEWS Score Temp: 98.3 F (36.8 C) BP: (!) 161/77 MAP (mmHg): 100 Pulse Rate: (!) 118 Resp: 20 Level of Consciousness: Alert SpO2: 99 % O2 Device: Nasal Cannula O2 Flow Rate (L/min): 3 L/min Assess: MEWS Score MEWS Temp: 0 MEWS Systolic: 0 MEWS Pulse: 2 MEWS RR: 0 MEWS LOC: 0 MEWS Score: 2 MEWS Score Color: Yellow Assess: SIRS CRITERIA SIRS Temperature : 0 SIRS Respirations : 0 SIRS Pulse: 1 SIRS WBC: 1 SIRS Score Sum : 2 SIRS Temperature : 0 SIRS Pulse: 1 SIRS Respirations : 0 SIRS WBC: 1 SIRS Score Sum : 2 Assess: if the MEWS score is Yellow or Red Were vital signs accurate and taken at a resting state?: Yes Does the patient meet 2 or more of the SIRS criteria?: No MEWS guidelines implemented : Yes, yellow Treat MEWS Interventions: Considered administering scheduled or prn medications/treatments as ordered Take Vital Signs Increase Vital Sign Frequency : Yellow: Q2hr x1, continue Q4hrs until patient remains green for 12hrs Escalate MEWS: Escalate: Yellow: Discuss with charge nurse and consider notifying provider and/or RRT    No new orders. Will continue to monitor.    Rosaline A Shunda Rabadi 02/16/2024, 3:00 PM

## 2024-02-16 NOTE — Assessment & Plan Note (Signed)
 Continue rescue inhaler

## 2024-02-16 NOTE — Assessment & Plan Note (Addendum)
 Present on admission.  Secondary to abscess right hand.  Patient had leukocytosis and tachycardia.  Looks like her white count is always a little bit elevated.  White blood cell count 28.9 on admission and down to 15.7.

## 2024-02-16 NOTE — Assessment & Plan Note (Signed)
On chronic prednisone

## 2024-02-16 NOTE — Assessment & Plan Note (Signed)
Continue 2 L of oxygen 

## 2024-02-16 NOTE — Plan of Care (Signed)
  Problem: Education: Goal: Knowledge of General Education information will improve Description: Including pain rating scale, medication(s)/side effects and non-pharmacologic comfort measures Outcome: Progressing   Problem: Health Behavior/Discharge Planning: Goal: Ability to manage health-related needs will improve Outcome: Progressing   Problem: Clinical Measurements: Goal: Ability to maintain clinical measurements within normal limits will improve Outcome: Progressing Goal: Will remain free from infection Outcome: Progressing Goal: Diagnostic test results will improve Outcome: Progressing Goal: Respiratory complications will improve Outcome: Progressing Goal: Cardiovascular complication will be avoided Outcome: Progressing   Problem: Activity: Goal: Risk for activity intolerance will decrease Outcome: Progressing   Problem: Nutrition: Goal: Adequate nutrition will be maintained Outcome: Progressing   Problem: Elimination: Goal: Will not experience complications related to bowel motility Outcome: Progressing Goal: Will not experience complications related to urinary retention Outcome: Progressing   Problem: Pain Managment: Goal: General experience of comfort will improve and/or be controlled Outcome: Progressing   Problem: Safety: Goal: Ability to remain free from injury will improve Outcome: Progressing   Problem: Skin Integrity: Goal: Risk for impaired skin integrity will decrease Outcome: Progressing   Problem: Clinical Measurements: Goal: Ability to avoid or minimize complications of infection will improve Outcome: Progressing   Problem: Skin Integrity: Goal: Skin integrity will improve Outcome: Progressing

## 2024-02-16 NOTE — Hospital Course (Signed)
 Hospital course / significant events:   78 y.o. 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  HPI: Patient attributes symptoms to possibly had an allergic reaction.  She states she has chronic back pain and has been taking gabapentin, recently came off gabapentin and started tramadol . Over the last week or so she has noticed increase in swelling in her lower legs and feet, swelling and redness and pain in right hand. She denies any injury.  No fever or chills. She also reports nausea and multiple episodes of vomiting.  Patient reports mild dry cough and SOB due to COPD which has not changed significantly.   12/02: ED physician did bedside ultrasound of her right hand, found to have right hand dorsal abscess, 10-12 cc of drainage is removed and sent out for culture. WBC 23.9 (baseline WBC 12-15), lactic acid 0.9, potassium 5.7, sodium 128, magnesium  2.5, phosphorus 3.0, GFR> 60, proBNP 885, liver function normal.  Temperature normal, blood pressure 197/91 --> 118/87, heart rate 110s, RR 20, oxygen saturation 87% on room air, which improved to 97% on 2 L home level oxygen.  Patient is admitted to telemetry bed as inpatient. 12/03.  WBC still elevated.  Hand looks a little less erythematous.  Continue antibiotics.  Patient states that she has been having some back pain and had a fall couple weeks ago.  MRI of the thoracic spine ordered.  12/04.  Reviewed MRI with patient showing a acute on chronic compression fracture of T10 and 35% vertebral height loss. Case discussed with interventional radiology team and they will not operate on this currently while treating infection and high white count.  Physical therapy recommending rehab but patient would like to go home.   Starting IV Lasix  12/05.  Patient not tolerating oral oxycodone .  Will try oral tramadol .  Switch Lasix  over to oral.  Switch antibiotics over to IV Ancef . 12/06.  White blood cell count coming down to 15.7.  Orthopedic surgery did a another I&D right hand. 12/07.  Patient complains of pain in the right hand. 12/08.  White blood cell count down to 13.4.  This is probably her normal.  Continue IV antibiotics 12/09.  Cleared by orthopedic surgery to switch over to oral antibiotics upon discharge.  Patient complaining of feeling of food getting caught in her throat.  Patient complaining of severe back pain.  Physical therapist very concerned about her going home alone.  Patient declining rehab. 12/10: transitioning to po meds, pt reports nausea w/ meds, attributes it to the carvedilol  and doesn't want to take this, adjusting meds and if tolerating po anticipate dc tomorrow  12/11: HH/DME ordered, ortho plan to see next week for skin check, they changed dressing and examined hand this morning no concerns.      Consultants:  Orthopedic surgery   Procedures/Surgeries: I&D R hand abscess       ASSESSMENT & PLAN:   Abscess of right hand Sepsis d/t hand abscess / cellulitis  Staph aureus on culture  Patient had I&D by ER physician.   Orthopedic surgery did another I&D on 12/6.   Of note, baseline WBC appears above normal  Orthopedic surgery gave clearance to change over to oral antibiotics (will treat for 7 days from 12/6).     Compression fracture of T10 vertebra  Pain  control: Patient did not do well with oral oxycodone  or tramadol .  Continue Tylenol .  Diclofenac  gel. Pt declined any opiates  As needed nausea medications.   per interventional radiology team (hospitalist d/w them in the beginning of the hospital course) and they will not operate at this time secondary to treating infection and high white count.   Patient refused back brace.   Patient interested in going home and not out  to rehab.     Acute on chronic diastolic CHF (congestive heart failure)  With elevation of proBNP and swelling of the lower extremities.    Ejection fraction 60 to 65%. Continue oral Lasix .   Dc Coreg  per patietn request  Patient resistant to any other medications for blood pressure or heart.   Sonogram lower extremities negative for DVT   COPD (chronic obstructive pulmonary disease) Continue rescue inhaler and Breo Ellipta    HTN (hypertension) Continue Coreg .     Nausea & vomiting Zofran    Hyponatremia - resolved Monitor BMP   Hyperkalemia - resolved Potassium normal range   Normocytic anemia Last hemoglobin 10.3   Legal blindness Pt states she is not worried about going home alone w/ this issue   Rheumatoid arthritis involving multiple sites On chronic prednisone    Chronic respiratory failure with hypoxia  Continue 2 L of oxygen    Wt Readings from Last 3 Encounters:  02/24/24 58 kg  11/25/23 55.9 kg  05/27/23 55.7 kg     No concerns based on BMI: Body mass index is 23.83 kg/m.SABRA Significantly low or high BMI is associated with higher medical risk.  Underweight - under 18  overweight - 25 to 29 obese - 30 or more Class 1 obesity: BMI of 30.0 to 34 Class 2 obesity: BMI of 35.0 to 39 Class 3 obesity: BMI of 40.0 to 49 Super Morbid Obesity: BMI 50-59 Super-super Morbid Obesity: BMI 60+ Healthy nutrition and physical activity advised as adjunct to other disease management and risk reduction treatments    DVT prophylaxis: lovenox  IV fluids: no continuous IV fluids  Nutrition: regular diet  Central lines / other devices: none  Code Status: DNR ACP documentation reviewed:  none on file in [facility]  [facility] needs: home health  Medical barriers to dispo: po tolerance. Expected medical readiness for discharge tomorrow.

## 2024-02-16 NOTE — Assessment & Plan Note (Addendum)
 Patient had I&D by ER physician.  White count trending better but still hurting.  Staph aureus growing out of culture and will fine-tune the antibiotics to Ancef .  Warm soaks.

## 2024-02-16 NOTE — Assessment & Plan Note (Addendum)
 Last sodium 1 point less than the normal range

## 2024-02-16 NOTE — Assessment & Plan Note (Addendum)
 Continue Coreg .  Tachycardia improved.

## 2024-02-16 NOTE — Assessment & Plan Note (Signed)
 SABRA

## 2024-02-16 NOTE — Assessment & Plan Note (Signed)
 Patient does have lower extremity edema.  Will check a proBNP and get a chest x-ray.

## 2024-02-16 NOTE — Assessment & Plan Note (Signed)
MRI thoracic spine

## 2024-02-16 NOTE — Progress Notes (Signed)
 Progress Note   Patient: Ashley Mack FMW:[id number] DOB: [birth year] DOA: 02/15/2024     1 DOS: the patient was seen and examined on 02/16/2024   Brief hospital course: 78 y.o. 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   Patient states that she possibly had an allergic reaction.  She states she has chronic back pain and has been taking gabapentin.  She states recently she came off gabapentin and started tramadol . Over the last week or so she has noticed increase in swelling in her lower legs and feet. She also noticed swelling and pain in right hand. Her right hand is erythematous.  She denies any injury.  No fever or chills. She also reports nausea and multiple episodes of vomiting.  Denies abdominal pain or diarrhea.  No symptoms of UTI.  Patient reports mild dry cough and SOB due to COPD which has not changed significantly.   ED physician did bedside ultrasound of her right hand, found to have right hand dorsal abscess, 10-12 cc of drainage is removed and sent out for culture.   Data reviewed independently and ED Course: pt was found to have WBC 23.9 (baseline WBC 12-15), lactic acid 0.9, potassium 5.7, sodium 128, magnesium  2.5, phosphorus 3.0, GFR> 60, proBNP 885, liver function normal.  Temperature normal, blood pressure 197/91 --> 118/87, heart rate 110s, RR 20, oxygen saturation 87% on room air, which improved to 97% on 2 L home level oxygen.  Patient is admitted to telemetry bed as inpatient.  12/3.  White blood cell count still elevated.  Hand looks a little less erythematous.  Continue antibiotics.  Patient states that she has been having some back pain and had a fall couple weeks ago.  MRI of the thoracic spine ordered.  Had some shortness  of breath.  Assessment and Plan: * Abscess of right hand Patient had I&D by ER physician.  Erythema looks a little bit better.  White count still very high.  Could be secondary to steroids.  On Rocephin  and vancomycin.  Sepsis (HCC) Present on admission.  Secondary to abscess right hand.  Patient had leukocytosis and tachycardia.  Acute mid back pain MRI thoracic spine  COPD (chronic obstructive pulmonary disease) (HCC) Continue nebulizer  HTN (hypertension) And tachycardia, started on Cardizem CD  Chronic diastolic CHF (congestive heart failure) (HCC) Patient does have lower extremity edema.  Will check a proBNP and get a chest x-ray.  Hyponatremia Sodium 131, started on salt tablets.  Hyperkalemia Potassium down to 4.7  Legal blindness .  Rheumatoid arthritis involving multiple sites [facility]) On chronic prednisone   Chronic respiratory failure with hypoxia (HCC) Continue 2 L of oxygen        Subjective: Patient brought in with infection right hand.  Also complains of back pain.  Had a fall around 2 weeks ago.  Been following for compression fracture.  MRI thoracic spine ordered.  Physical Exam: Vitals:   02/16/24 0437 02/16/24 0738 02/16/24 0943 02/16/24 1420  BP: 120/78 (!) 152/93 (!) 153/99 (!) 161/77  Pulse: (!) 109 (!) 112 (!) 120 (!) 118  Resp: 18 17 18 20   Temp: (!) 97.3 F (36.3 C) 97.7 F (36.5 C) 97.9 F (36.6 C) 98.3 F (36.8 C)  TempSrc: Oral   Oral  SpO2: 97% 100% 100% 99%  Weight: 61.1 kg     Height:       Physical Exam HENT:     Head: Normocephalic.  Eyes:     General: Lids are normal.  Cardiovascular:     Rate and Rhythm: Regular rhythm. Tachycardia present.     Heart sounds: Normal heart sounds, S1 normal and S2 normal.  Pulmonary:     Breath sounds: Examination of the right-lower field reveals decreased breath sounds. Examination of the left-lower field reveals decreased breath sounds. Decreased breath sounds present. No wheezing,  rhonchi or rales.  Abdominal:     Palpations: Abdomen is soft.     Tenderness: There is no abdominal tenderness.  Musculoskeletal:     Right lower leg: Swelling present.     Left lower leg: Swelling present.  Skin:    General: Skin is warm.     Findings: No rash.  Neurological:     Mental Status: She is alert and oriented to person, place, and time.     Data Reviewed: Sodium 131, potassium 4.7, BUN 37, creatinine 0.76, lactic acid 1.4, white blood count 29.9, platelet count 437, hemoglobin 10.2  Family Communication: Spoke with patient's daughter on the phone  Disposition: Status is: Inpatient Remains inpatient appropriate because: Continue IV antibiotics for hand infection.  Evaluate back with MRI of the thoracic spine.  PT and OT consultations tomorrow.  Planned Discharge Destination: To be determined    Time spent: 28 minutes  Author: Charlie Patterson, MD 02/16/2024 3:47 PM  For on call review [email or web address].

## 2024-02-17 ENCOUNTER — Inpatient Hospital Stay

## 2024-02-17 ENCOUNTER — Inpatient Hospital Stay: Admit: 2024-02-17 | Discharge: 2024-02-17 | Disposition: A | Attending: Internal Medicine | Admitting: Internal Medicine

## 2024-02-17 DIAGNOSIS — I5031 Acute diastolic (congestive) heart failure: Secondary | ICD-10-CM | POA: Diagnosis not present

## 2024-02-17 DIAGNOSIS — I10 Essential (primary) hypertension: Secondary | ICD-10-CM | POA: Diagnosis not present

## 2024-02-17 DIAGNOSIS — I5033 Acute on chronic diastolic (congestive) heart failure: Secondary | ICD-10-CM | POA: Insufficient documentation

## 2024-02-17 DIAGNOSIS — S22070A Wedge compression fracture of T9-T10 vertebra, initial encounter for closed fracture: Secondary | ICD-10-CM | POA: Diagnosis not present

## 2024-02-17 DIAGNOSIS — L02511 Cutaneous abscess of right hand: Secondary | ICD-10-CM | POA: Diagnosis not present

## 2024-02-17 DIAGNOSIS — J439 Emphysema, unspecified: Secondary | ICD-10-CM | POA: Diagnosis not present

## 2024-02-17 LAB — ECHOCARDIOGRAM COMPLETE
AR max vel: 2.08 cm2
AV Area VTI: 2.16 cm2
AV Area mean vel: 1.88 cm2
AV Mean grad: 5.3 mmHg
AV Peak grad: 9.4 mmHg
Ao pk vel: 1.53 m/s
Area-P 1/2: 5.27 cm2
Height: 62 in
MV VTI: 3.14 cm2
S' Lateral: 3 cm
Weight: 2116.42 [oz_av]

## 2024-02-17 LAB — BASIC METABOLIC PANEL WITH GFR
Anion gap: 8 (ref 5–15)
BUN: 30 mg/dL — ABNORMAL HIGH (ref 8–23)
CO2: 31 mmol/L (ref 22–32)
Calcium: 8.6 mg/dL — ABNORMAL LOW (ref 8.9–10.3)
Chloride: 96 mmol/L — ABNORMAL LOW (ref 98–111)
Creatinine, Ser: 0.72 mg/dL (ref 0.44–1.00)
GFR, Estimated: >60 mL/min (ref >60)
Glucose, Bld: 165 mg/dL — ABNORMAL HIGH (ref 70–99)
Potassium: 4.6 mmol/L (ref 3.5–5.1)
Sodium: 135 mmol/L (ref 135–145)

## 2024-02-17 LAB — PRO BRAIN NATRIURETIC PEPTIDE: Pro Brain Natriuretic Peptide: 1624 pg/mL — ABNORMAL HIGH (ref <300.0)

## 2024-02-17 MED ORDER — ONDANSETRON HCL 4 MG/2ML IJ SOLN
4.0000 mg | Freq: Four times a day (QID) | INTRAMUSCULAR | Status: DC | PRN
  Administered 2024-02-18 – 2024-02-23 (×4): 4 mg via INTRAVENOUS
  Filled 2024-02-17 (×5): qty 2

## 2024-02-17 MED ORDER — PREDNISONE 10 MG PO TABS
10.0000 mg | ORAL_TABLET | Freq: Every day | ORAL | Status: DC
  Administered 2024-02-17 – 2024-02-20 (×4): 10 mg via ORAL
  Filled 2024-02-17 (×4): qty 1

## 2024-02-17 MED ORDER — FUROSEMIDE 10 MG/ML IJ SOLN
20.0000 mg | Freq: Two times a day (BID) | INTRAMUSCULAR | Status: DC
  Administered 2024-02-17 (×2): 20 mg via INTRAVENOUS
  Filled 2024-02-17 (×3): qty 4

## 2024-02-17 MED ORDER — PROCHLORPERAZINE EDISYLATE 10 MG/2ML IJ SOLN
10.0000 mg | Freq: Once | INTRAMUSCULAR | Status: AC
  Administered 2024-02-17: 10 mg via INTRAVENOUS
  Filled 2024-02-17: qty 2

## 2024-02-17 MED ORDER — CARVEDILOL 3.125 MG PO TABS
3.1250 mg | ORAL_TABLET | Freq: Two times a day (BID) | ORAL | Status: DC
  Administered 2024-02-17 – 2024-02-23 (×13): 3.125 mg via ORAL
  Filled 2024-02-17 (×13): qty 1

## 2024-02-17 MED ORDER — OXYCODONE HCL 5 MG PO TABS
10.0000 mg | ORAL_TABLET | Freq: Four times a day (QID) | ORAL | Status: DC | PRN
  Filled 2024-02-17: qty 2

## 2024-02-17 MED ORDER — MORPHINE SULFATE (PF) 2 MG/ML IV SOLN
1.0000 mg | INTRAVENOUS | Status: DC | PRN
  Administered 2024-02-17: 1 mg via INTRAVENOUS
  Filled 2024-02-17: qty 1

## 2024-02-17 NOTE — Progress Notes (Signed)
*  PRELIMINARY RESULTS* Echocardiogram 2D Echocardiogram has been performed.  Ashley Mack 02/17/2024, 2:28 PM

## 2024-02-17 NOTE — NC FL2 (Signed)
 [place]  MEDICAID FL2 LEVEL OF CARE FORM     IDENTIFICATION  Patient Name: Ashley Mack Birthdate: 1945/10/05 Sex: female Admission Date (Current Location): 02/15/2024  [place] and [place] Number:  Chiropodist and Address:  [facility], [address] [place], [place] [address]      Provider Number: [id number]  Attending Physician Name and Address:  Josette Ade, MD  Relative Name and Phone Number:       Current Level of Care: Hospital Recommended Level of Care: Skilled Nursing Facility Prior Approval Number:    Date Approved/Denied:   PASRR Number: [id number]  Discharge Plan: Home    Current Diagnoses: Patient Active Problem List   Diagnosis Date Noted   Chronic respiratory failure with hypoxia (HCC) 02/16/2024   Abscess of right hand 02/15/2024   Sepsis (HCC) 02/15/2024   HTN (hypertension) 02/15/2024   COPD (chronic obstructive pulmonary disease) (HCC) 02/15/2024   Chronic diastolic CHF (congestive heart failure) (HCC) 02/15/2024   Legal blindness 02/15/2024   Hyperkalemia 02/15/2024   Hyponatremia 02/15/2024   Nausea & vomiting 02/15/2024   Leukocytosis 07/30/2022   Thrombocytosis 04/28/2022   Fatty liver 07/20/2021   Hernia 07/20/2021   COPD with acute exacerbation (HCC) 03/19/2021   COPD exacerbation (HCC) 03/18/2021   Thoracic disc herniation 11/06/2019   Arthropathy of thoracic facet joint 11/06/2019   Cervical facet joint syndrome 11/06/2019   Statin declined 11/02/2019   Closed wedge compression fracture of T8 vertebra (HCC) 09/20/2019   Elevated blood-pressure reading, without diagnosis of hypertension 09/20/2019   GERD (gastroesophageal reflux disease) 06/18/2019   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HCC) 06/18/2019   H/O kyphoplasty 06/18/2019   Compression fracture of body of thoracic vertebra (HCC) 06/16/2019   At high risk for falls 05/05/2018   Pain in thoracic spine 11/18/2017   Flank  pain    Acute mid back pain 08/08/2017   Compression fracture of lumbar vertebra (HCC) 08/08/2017   Abdominal pain, RUQ 08/06/2017   Healthcare maintenance 05/06/2017   Guaiac positive stools 11/26/2016   Iron  deficiency anemia 09/01/2016   B12 deficiency 08/31/2016   Normocytic anemia 08/30/2016   Centrilobular emphysema (HCC) 05/09/2015   Chronic fatigue 06/21/2014   SOB (shortness of breath) on exertion 06/21/2014   Spondylosis of thoracic region without myelopathy or radiculopathy 01/22/2014   Aortic atherosclerosis 12/25/2013   HTN (hypertension), benign 12/10/2013   Chronic insomnia 12/10/2013   Migraines 12/10/2013   Osteoporosis 08/03/2013   Retinal disease 08/03/2013   Scotoma involving central area in visual field 03/29/2012    Orientation RESPIRATION BLADDER Height & Weight     Self, Time, Place, Situation  O2 (2 L) Continent Weight: 132 lb 4.4 oz (60 kg) Height:  5' 2 (157.5 cm)  BEHAVIORAL SYMPTOMS/MOOD NEUROLOGICAL BOWEL NUTRITION STATUS      Continent Diet (Regular)  AMBULATORY STATUS COMMUNICATION OF NEEDS Skin   Limited Assist Verbally Normal                       Personal Care Assistance Level of Assistance  Bathing, Dressing, Feeding Bathing Assistance: Limited assistance Feeding assistance: Independent Dressing Assistance: Limited assistance     Functional Limitations Info  Sight, Hearing, Speech Sight Info: Impaired (Blind) Hearing Info: Impaired Speech Info: Adequate    SPECIAL CARE FACTORS FREQUENCY  PT (By licensed PT), OT (By licensed OT)     PT Frequency: 5x/week OT Frequency: 5x/week  Contractures      Additional Factors Info  Code Status, Allergies Code Status Info: Full Allergies Info: Methotrexate And Trimetrexate, Other, Gabapentin, Ranitidine, Sulfasalazine, Topiramate           Current Medications (02/17/2024):  This is the current hospital active medication list Current Facility-Administered  Medications  Medication Dose Route Frequency Provider Last Rate Last Admin   acetaminophen  (TYLENOL ) tablet 650 mg  650 mg Oral Q6H PRN Niu, Xilin, MD       albuterol  (VENTOLIN  HFA) 108 (90 Base) MCG/ACT inhaler 1-2 puff  1-2 puff Inhalation Q4H PRN Josette Ade, MD   2 puff at 02/16/24 1227   calcitonin (salmon) (MIACALCIN/FORTICAL) nasal spray 1 spray  1 spray Alternating Nares Daily Josette Ade, MD   1 spray at 02/17/24 0915   carvedilol (COREG) tablet 3.125 mg  3.125 mg Oral BID WC Josette Ade, MD   3.125 mg at 02/17/24 9085   cefTRIAXone  (ROCEPHIN ) 2 g in sodium chloride  0.9 % 100 mL IVPB  2 g Intravenous Q24H Niu, Xilin, MD 200 mL/hr at 02/16/24 2057 2 g at 02/16/24 2057   cyclobenzaprine  (FLEXERIL ) tablet 5 mg  5 mg Oral TID PRN Niu, Xilin, MD       dextromethorphan -guaiFENesin  (MUCINEX  DM) 30-600 MG per 12 hr tablet 1 tablet  1 tablet Oral BID PRN Niu, Xilin, MD       diclofenac  Sodium (VOLTAREN ) 1 % topical gel 2 g  2 g Topical QID PRN Josette Ade, MD       Fe Fum-Vit C-Vit B12-FA (TRIGELS-F FORTE) capsule 1 capsule  1 capsule Oral QPC breakfast Niu, Xilin, MD   1 capsule at 02/17/24 0915   fluticasone  furoate-vilanterol (BREO ELLIPTA ) 100-25 MCG/ACT 1 puff  1 puff Inhalation Daily Josette Ade, MD   1 puff at 02/17/24 0916   furosemide  (LASIX ) injection 20 mg  20 mg Intravenous BID Josette Ade, MD   20 mg at 02/17/24 9083   hydrALAZINE  (APRESOLINE ) injection 5 mg  5 mg Intravenous Q2H PRN Niu, Xilin, MD   5 mg at 02/16/24 9746   lidocaine  (LIDODERM ) 5 % 1 patch  1 patch Transdermal Q24H Niu, Xilin, MD       morphine  (PF) 2 MG/ML injection 1 mg  1 mg Intravenous Q4H PRN Niu, Xilin, MD   1 mg at 02/17/24 0805   ondansetron  (ZOFRAN ) injection 4 mg  4 mg Intravenous Q8H PRN Niu, Xilin, MD   4 mg at 02/17/24 9180   oxyCODONE -acetaminophen  (PERCOCET/ROXICET) 5-325 MG per tablet 1 tablet  1 tablet Oral Q4H PRN Niu, Xilin, MD   1 tablet at 02/16/24 1231   pantoprazole   (PROTONIX ) EC tablet 40 mg  40 mg Oral Daily Niu, Xilin, MD   40 mg at 02/17/24 0914   predniSONE  (DELTASONE ) tablet 10 mg  10 mg Oral Daily Josette Ade, MD   10 mg at 02/17/24 9085   sodium chloride  (OCEAN) 0.65 % nasal spray 1 spray  1 spray Each Nare PRN Josette Ade, MD       traZODone  (DESYREL ) tablet 100 mg  100 mg Oral QHS PRN Niu, Xilin, MD       vancomycin (VANCOREADY) IVPB 750 mg/150 mL  750 mg Intravenous Q24H Niu, Xilin, MD 150 mL/hr at 02/16/24 2252 750 mg at 02/16/24 2252     Discharge Medications: Please see discharge summary for a list of discharge medications.  Relevant Imaging Results:  Relevant Lab Results:   Additional Information SSN: [id number]  Regginald Pask  [place], LCSW

## 2024-02-17 NOTE — Plan of Care (Signed)
  Problem: Education: Goal: Knowledge of General Education information will improve Description: Including pain rating scale, medication(s)/side effects and non-pharmacologic comfort measures Outcome: Progressing   Problem: Clinical Measurements: Goal: Ability to maintain clinical measurements within normal limits will improve Outcome: Progressing   Problem: Clinical Measurements: Goal: Will remain free from infection Outcome: Progressing   Problem: Clinical Measurements: Goal: Diagnostic test results will improve Outcome: Progressing   Problem: Clinical Measurements: Goal: Respiratory complications will improve Outcome: Progressing   Problem: Elimination: Goal: Will not experience complications related to bowel motility Outcome: Progressing   Problem: Safety: Goal: Ability to remain free from injury will improve Outcome: Progressing   Problem: Skin Integrity: Goal: Skin integrity will improve Outcome: Progressing

## 2024-02-17 NOTE — Assessment & Plan Note (Addendum)
 With elevation of proBNP and swelling of the lower extremities.  Continue oral Lasix .  Continue Coreg .  Ejection fraction 60 to 65%.

## 2024-02-17 NOTE — Progress Notes (Signed)
  IR BRIEF PROGRESS NOTE:  IR was requested for T10 kyphoplasty. Patient is admitted with sepsis secondary to Right arm abscess with cellulitis. MRI thoracic with acute on chronic T10 compression fracture with 35% vertebral body height loss. Previous vertebral augmentation at T5, T6, and T11 to L2.   [facility] attending, Dr. Jenna, has reviewed the request, but deferred it at this time due to patient leukocytosis during sepsis admission. IR recommends outpatient follow-up consultation 2 weeks s/p discharge, once leukocytosis has returned to baseline. Patient is noted on chronic steroids, with chronic leukocytosis.   Outpatient follow-up/consult order placed. Patient will be contacted by IR scheduler upon discharge to schedule consult appointment at [facility]. Care team made aware by Epic secure chat.    Electronically Signed: Carlin DELENA Griffon, PA-C 02/17/2024, 10:06 AM

## 2024-02-17 NOTE — Progress Notes (Addendum)
 Progress Note   Patient: Ashley Mack FMW:[id number] DOB: 1945-05-12 DOA: 02/15/2024     2 DOS: the patient was seen and examined on 02/17/2024   Brief hospital course: 78 y.o. 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   Patient states that she possibly had an allergic reaction.  She states she has chronic back pain and has been taking gabapentin.  She states recently she came off gabapentin and started tramadol . Over the last week or so she has noticed increase in swelling in her lower legs and feet. She also noticed swelling and pain in right hand. Her right hand is erythematous.  She denies any injury.  No fever or chills. She also reports nausea and multiple episodes of vomiting.  Denies abdominal pain or diarrhea.  No symptoms of UTI.  Patient reports mild dry cough and SOB due to COPD which has not changed significantly.   ED physician did bedside ultrasound of her right hand, found to have right hand dorsal abscess, 10-12 cc of drainage is removed and sent out for culture.   Data reviewed independently and ED Course: pt was found to have WBC 23.9 (baseline WBC 12-15), lactic acid 0.9, potassium 5.7, sodium 128, magnesium  2.5, phosphorus 3.0, GFR> 60, proBNP 885, liver function normal.  Temperature normal, blood pressure 197/91 --> 118/87, heart rate 110s, RR 20, oxygen saturation 87% on room air, which improved to 97% on 2 L home level oxygen.  Patient is admitted to telemetry bed as inpatient.  12/3.  White blood cell count still elevated.  Hand looks a little less erythematous.  Continue antibiotics.  Patient states that she has been having some back pain and had a fall couple weeks ago.  MRI of the thoracic spine ordered.  Had some shortness  of breath. 12/4.  Reviewed MRI with patient showing a acute on chronic compression fracture of T10 and 35% vertebral height loss.  Case discussed with interventional radiology team and they will not operate on this currently while treating infection and high white count.  Physical therapy recommending rehab but patient would like to go home.  Starting IV Lasix   Assessment and Plan: * Abscess of right hand Patient had I&D by ER physician.  White count still very high.  Could be secondary to steroids.  On Rocephin  and vancomycin.  Sepsis (HCC) Present on admission.  Secondary to abscess right hand.  Patient had leukocytosis and tachycardia.  Compression fracture of T10 vertebra (HCC) Will increase oxycodone  to 10 mg as needed for pain.  Continue IV morphine .  As needed nausea medications.  Case discussed with interventional radiology team and they would not operate at this time secondary to treating infection and high white count.  Refused back brace.  COPD (chronic obstructive pulmonary disease) (HCC) Continue rescue inhaler  HTN (hypertension) And tachycardia.  Started Coreg this morning.  Hyponatremia Sodium normal range.  Stop salt tablets.  Hyperkalemia Potassium normal range  Legal blindness .  Rheumatoid arthritis involving multiple sites [facility]) On chronic prednisone   Acute on chronic diastolic CHF (congestive heart failure) (HCC) With elevation of proBNP and swelling of the lower extremities, I will start IV Lasix .  Coreg started.  Echocardiogram ordered.  Chronic respiratory failure with hypoxia (HCC) Continue 2 L of oxygen  Subjective: Patient complaining of a lot of pain in her back.  Was able to rotate to her side for me to listen to her lungs but had a lot of pain.  Admitted with right hand infection but her main complaint is back pain with the compression fracture.  Physical Exam: Vitals:   02/16/24 2220 02/17/24 0420 02/17/24 0424 02/17/24 0753  BP:  (!) 164/75 (!) 160/67  (!) 164/73  Pulse: (!) 118 (!) 112  100  Resp: 18 16  16   Temp: (!) 97.5 F (36.4 C) 98.7 F (37.1 C)  98.3 F (36.8 C)  TempSrc:      SpO2: 92% 94%  100%  Weight:   60 kg   Height:       Physical Exam HENT:     Head: Normocephalic.  Eyes:     General: Lids are normal.  Cardiovascular:     Rate and Rhythm: Regular rhythm. Tachycardia present.     Heart sounds: Normal heart sounds, S1 normal and S2 normal.  Pulmonary:     Breath sounds: Examination of the right-lower field reveals decreased breath sounds. Examination of the left-lower field reveals decreased breath sounds. Decreased breath sounds present. No wheezing, rhonchi or rales.  Abdominal:     Palpations: Abdomen is soft.     Tenderness: There is no abdominal tenderness.  Musculoskeletal:     Right lower leg: Swelling present.     Left lower leg: Swelling present.  Skin:    General: Skin is warm.     Findings: No rash.  Neurological:     Mental Status: She is alert and oriented to person, place, and time.     Data Reviewed: Creatinine 0.72, sodium 135, potassium 4.6, proBNP 1624  Family Communication: Spoke with daughter on the phone  Disposition: Status is: Inpatient Remains inpatient appropriate because: Needs better pain control for compression fracture.  Will start IV Lasix  for lower extremity edema and high BNP.  Continue IV antibiotics.  Currently not able to do too much with physical therapy.  Refused back brace.  Planned Discharge Destination: Physical therapy recommending rehab but patient would like to go home.     Time spent: 28 minutes Case discussed with TOC, physical therapy, interventional radiology team  Author: Charlie Patterson, MD 02/17/2024 1:13 PM  For on call review [email or web address].

## 2024-02-17 NOTE — Assessment & Plan Note (Addendum)
 Patient not doing well with oral oxycodone  or tramadol .  Continue Tylenol  and as needed IV morphine .  As needed nausea medications.  Case discussed with interventional radiology team and they will not operate at this time secondary to treating infection and high white count.  Patient refused back brace.  Diclofenac  gel.

## 2024-02-17 NOTE — Evaluation (Signed)
 Occupational Therapy Evaluation Patient Details Name: Ashley Mack MRN: [id number] DOB: Mar 30, 1945 Today's Date: 02/17/2024   History of Present Illness   Ashley Mack is a 78 y.o. female with medical history significant of blindness, HTN, COPD on 2L O2, dCHF, GERD, iron  deficiency anemia, anxiety, chronic back pain, migraine, rheumatoid arthritis with finger deformity on prednisone  chronically, chronic leukocytosis, who presents with right hand swelling, redness and pain.Patient has T10 compression fx.   Clinical Impressions Pt was seen for OT evaluation and co-tx with PT this date. Prior to hospital admission, pt reports being mod indep with ADL, in-home IADL, and mobility reaching out for furniture 2/2 low vision. She reports using an air fryer for meals primarily and has her medications in specific locations so she knows what/when to take something by feel. Pt reports she is only able to see shadows. Pt lives alone but endorses 24/7 assist available from family if needed. Pt presents with deficits in severe middle back pain with any attempts at mobility, decreased strength, activity tolerance, and anticipate impaired balance, affecting safe and optimal ADL completion. Pt currently requires MAX A +2 for rolling/repositioning in bed. Pt unable to tolerate more. Pt would benefit from skilled OT services to address noted impairments and functional limitations (see below for any additional details) in order to maximize safety and independence while minimizing future risk of falls, injury, and readmission. Anticipate the need for follow up OT services upon acute hospital DC.    If plan is discharge home, recommend the following:   Two people to help with walking and/or transfers;Two people to help with bathing/dressing/bathroom;Assist for transportation;Assistance with cooking/housework;Help with stairs or ramp for entrance     Functional Status Assessment   Patient has had a recent decline in  their functional status and demonstrates the ability to make significant improvements in function in a reasonable and predictable amount of time.     Equipment Recommendations   Hospital bed;Hoyer lift;BSC/3in1;Wheelchair (measurements OT);Wheelchair cushion (measurements OT)     Recommendations for Other Services         Precautions/Restrictions   Precautions Precautions: Back;Fall Recall of Precautions/Restrictions: Intact Restrictions Weight Bearing Restrictions Per Provider Order: No     Mobility Bed Mobility Overal bed mobility: Needs Assistance Bed Mobility: Rolling Rolling: +2 for physical assistance, Max assist         General bed mobility comments: patient unable to tolerate more than partial roll to her right due to severe pain.    Transfers                   General transfer comment: unable      Balance                                           ADL either performed or assessed with clinical judgement   ADL Overall ADL's : Needs assistance/impaired                                       General ADL Comments: Pt currently requires MAX A for all bed level toileting, dressing, and bathing. setup for bed level grooming adn self feeding     Vision         Perception         Praxis  Pertinent Vitals/Pain Pain Assessment Pain Assessment: 0-10 Pain Score: 10-Worst pain ever Pain Location: back with any attempts at bed mobility Pain Descriptors / Indicators: Aching, Moaning, Grimacing, Guarding, Crushing, Sharp Pain Intervention(s): Limited activity within patient's tolerance, Monitored during session, Premedicated before session, Repositioned, Patient requesting pain meds-RN notified     Extremity/Trunk Assessment Upper Extremity Assessment Upper Extremity Assessment: Generalized weakness (H hand bandaged)   Lower Extremity Assessment Lower Extremity Assessment: Generalized weakness    Cervical / Trunk Assessment Cervical / Trunk Assessment:  (severe pain with minimal movement)   Communication Communication Communication: No apparent difficulties   Cognition Arousal: Alert Behavior During Therapy: WFL for tasks assessed/performed Cognition: No family/caregiver present to determine baseline                               Following commands: Intact       Cueing  General Comments   Cueing Techniques: Verbal cues      Exercises     Shoulder Instructions      Home Living Family/patient expects to be discharged to:: Private residence Living Arrangements: Alone Available Help at Discharge: Family;Available PRN/intermittently Type of Home: House       Home Layout: One level               Home Equipment: [employer] (2 wheels);Wheelchair - manual   Additional Comments: patient reports she was managing well at home prior to admission. Daughter provides assist as needed      Prior Functioning/Environment Prior Level of Function : Independent/Modified Independent             Mobility Comments: has walker and wheelchair at home ADLs Comments: daughter assists as needed    OT Problem List: Decreased strength;Pain;Cardiopulmonary status limiting activity;Decreased activity tolerance;Impaired balance (sitting and/or standing);Decreased knowledge of use of DME or AE;Decreased safety awareness   OT Treatment/Interventions: Self-care/ADL training;Therapeutic exercise;Therapeutic activities;Energy conservation;DME and/or AE instruction;Patient/family education;Balance training      OT Goals(Current goals can be found in the care plan section)   Acute Rehab OT Goals Patient Stated Goal: have less pain OT Goal Formulation: With patient Time For Goal Achievement: 03/02/24 Potential to Achieve Goals: Fair ADL Goals Pt Will Perform Lower Body Dressing: sitting/lateral leans;sit to/from stand;with adaptive equipment;with mod  assist;with min assist Pt Will Transfer to Toilet: with max assist;stand pivot transfer;bedside commode (LRAD) Pt Will Perform Toileting - Clothing Manipulation and hygiene: with caregiver independent in assisting;sitting/lateral leans;sit to/from stand Additional ADL Goal #1: Pt will complete bed mobility in anticipation of EOB/OOB ADL tasks with caregiver indep in assisting, 4/4 opportunities.   OT Frequency:  Min 2X/week    Co-evaluation PT/OT/SLP Co-Evaluation/Treatment: Yes Reason for Co-Treatment: Necessary to address cognition/behavior during functional activity;For patient/therapist safety;To address functional/ADL transfers PT goals addressed during session: Mobility/safety with mobility OT goals addressed during session: ADL's and self-care      AM-PAC OT 6 Clicks Daily Activity     Outcome Measure Help from another person eating meals?: None Help from another person taking care of personal grooming?: A Little Help from another person toileting, which includes using toliet, bedpan, or urinal?: Total Help from another person bathing (including washing, rinsing, drying)?: A Lot Help from another person to put on and taking off regular upper body clothing?: A Lot Help from another person to put on and taking off regular lower body clothing?: A Lot 6 Click Score: 14   End of Session  Equipment Utilized During Treatment: Oxygen Nurse Communication: Mobility status;Patient requests pain meds  Activity Tolerance: Patient limited by pain Patient left: in bed;with call bell/phone within reach;with bed alarm set  OT Visit Diagnosis: Other abnormalities of gait and mobility (R26.89);Muscle weakness (generalized) (M62.81);History of falling (Z91.81);Pain Pain - Right/Left:  (back)                Time: 8899-8878 OT Time Calculation (min): 21 min Charges:  OT General Charges $OT Visit: 1 Visit OT Evaluation $OT Eval Moderate Complexity: 1 Mod  Warren SAUNDERS., MPH, MS, OTR/L ascom  [phone] 02/17/24, 1:25 PM

## 2024-02-17 NOTE — Evaluation (Signed)
 Physical Therapy Evaluation Patient Details Name: Ashley Mack MRN: [id number] DOB: [birth year] Today's Date: 02/17/2024  History of Present Illness  Ashley Mack is a 78 y.o. female with medical history significant of blindness, HTN, COPD on 2L O2, dCHF, GERD, iron  deficiency anemia, anxiety, chronic back pain, migraine, rheumatoid arthritis with finger deformity on prednisone  chronically, chronic leukocytosis, who presents with right hand swelling, redness and pain.Patient has T10 compression fx.   Clinical Impression  Patient received in bed, she is hesitant to move at all due to severe pain in back. She requires +2 max assist to slide up in bed and was able to roll to her right side partially with severe pain. Patient will continue to benefit from skilled PT  to improve mobility and independence. She will need to get pain managed.            If plan is discharge home, recommend the following: Two people to help with walking and/or transfers;Two people to help with bathing/dressing/bathroom   Can travel by private vehicle   No    Equipment Recommendations  TBD  Recommendations for Other Services       Functional Status Assessment Patient has had a recent decline in their functional status and demonstrates the ability to make significant improvements in function in a reasonable and predictable amount of time.     Precautions / Restrictions Precautions Precautions: Back;Fall Recall of Precautions/Restrictions: Intact Restrictions Weight Bearing Restrictions Per Provider Order: No      Mobility  Bed Mobility Overal bed mobility: Needs Assistance Bed Mobility: Rolling Rolling: +2 for physical assistance, Max assist         General bed mobility comments: patient unable to tolerate more than partial roll to her right due to severe pain.    Transfers                   General transfer comment: unable    Ambulation/Gait               General Gait  Details: unable  Stairs            Wheelchair Mobility     Tilt Bed    Modified Rankin (Stroke Patients Only)       Balance                                             Pertinent Vitals/Pain Pain Assessment Pain Assessment: PAINAD Breathing: normal Negative Vocalization: occasional moan/groan, low speech, negative/disapproving quality Facial Expression: facial grimacing Body Language: tense, distressed pacing, fidgeting Consolability: distracted or reassured by voice/touch PAINAD Score: 5 Pain Intervention(s): Repositioned    Home Living Family/patient expects to be discharged to:: Private residence Living Arrangements: Alone Available Help at Discharge: Family;Available PRN/intermittently Type of Home: House         Home Layout: One level Home Equipment: [employer]ant (2 wheels);Wheelchair - manual Additional Comments: patient reports she was managing well at home prior to admission. Daughter provides assist as needed    Prior Function Prior Level of Function : Independent/Modified Independent             Mobility Comments: has walker and wheelchair at home ADLs Comments: daughter assists as needed     Extremity/Trunk Assessment   Upper Extremity Assessment Upper Extremity Assessment: Defer to OT evaluation    Lower Extremity Assessment Lower Extremity  Assessment: Generalized weakness    Cervical / Trunk Assessment Cervical / Trunk Assessment:  (severe pain with minimal movement)  Communication   Communication Communication: No apparent difficulties    Cognition Arousal: Alert Behavior During Therapy: WFL for tasks assessed/performed   PT - Cognitive impairments: No apparent impairments                         Following commands: Intact       Cueing Cueing Techniques: Verbal cues     General Comments      Exercises     Assessment/Plan    PT Assessment Patient needs continued PT services  PT  Problem List Decreased activity tolerance;Pain;Decreased mobility       PT Treatment Interventions Functional mobility training;Therapeutic activities;Therapeutic exercise;Patient/family education    PT Goals (Current goals can be found in the Care Plan section)  Acute Rehab PT Goals Patient Stated Goal: to improve pain PT Goal Formulation: With patient Time For Goal Achievement: 03/02/24 Potential to Achieve Goals: Fair    Frequency Min 2X/week     Co-evaluation PT/OT/SLP Co-Evaluation/Treatment: Yes Reason for Co-Treatment: Necessary to address cognition/behavior during functional activity;For patient/therapist safety;To address functional/ADL transfers PT goals addressed during session: Mobility/safety with mobility         AM-PAC PT 6 Clicks Mobility  Outcome Measure Help needed turning from your back to your side while in a flat bed without using bedrails?: Total Help needed moving from lying on your back to sitting on the side of a flat bed without using bedrails?: Total Help needed moving to and from a bed to a chair (including a wheelchair)?: Total Help needed standing up from a chair using your arms (e.g., wheelchair or bedside chair)?: Total Help needed to walk in hospital room?: Total Help needed climbing 3-5 steps with a railing? : Total 6 Click Score: 6    End of Session Equipment Utilized During Treatment: Oxygen Activity Tolerance: Patient limited by pain Patient left: in bed;with call bell/phone within reach;with bed alarm set Nurse Communication: Mobility status;Patient requests pain meds PT Visit Diagnosis: Other abnormalities of gait and mobility (R26.89);Pain Pain - part of body:  (back)    Time: 8899-8875 PT Time Calculation (min) (ACUTE ONLY): 24 min   Charges:   PT Evaluation $PT Eval Moderate Complexity: 1 Mod   PT General Charges $$ ACUTE PT VISIT: 1 Visit         Lola Czerwonka, PT, GCS 02/17/24,11:39 AM

## 2024-02-18 DIAGNOSIS — S22070S Wedge compression fracture of T9-T10 vertebra, sequela: Secondary | ICD-10-CM

## 2024-02-18 DIAGNOSIS — L02511 Cutaneous abscess of right hand: Secondary | ICD-10-CM | POA: Diagnosis not present

## 2024-02-18 DIAGNOSIS — J439 Emphysema, unspecified: Secondary | ICD-10-CM | POA: Diagnosis not present

## 2024-02-18 DIAGNOSIS — I5033 Acute on chronic diastolic (congestive) heart failure: Secondary | ICD-10-CM | POA: Diagnosis not present

## 2024-02-18 LAB — CBC
HCT: 32.6 % — ABNORMAL LOW (ref 36.0–46.0)
Hemoglobin: 10.5 g/dL — ABNORMAL LOW (ref 12.0–15.0)
MCH: 28.8 pg (ref 26.0–34.0)
MCHC: 32.2 g/dL (ref 30.0–36.0)
MCV: 89.3 fL (ref 80.0–100.0)
Platelets: 478 K/uL — ABNORMAL HIGH (ref 150–400)
RBC: 3.65 MIL/uL — ABNORMAL LOW (ref 3.87–5.11)
RDW: 16.1 % — ABNORMAL HIGH (ref 11.5–15.5)
WBC: 19.4 K/uL — ABNORMAL HIGH (ref 4.0–10.5)
nRBC: 0 % (ref 0.0–0.2)

## 2024-02-18 LAB — BASIC METABOLIC PANEL WITH GFR
Anion gap: 11 (ref 5–15)
BUN: 23 mg/dL (ref 8–23)
CO2: 33 mmol/L — ABNORMAL HIGH (ref 22–32)
Calcium: 9 mg/dL (ref 8.9–10.3)
Chloride: 93 mmol/L — ABNORMAL LOW (ref 98–111)
Creatinine, Ser: 0.64 mg/dL (ref 0.44–1.00)
GFR, Estimated: >60 mL/min (ref >60)
Glucose, Bld: 90 mg/dL (ref 70–99)
Potassium: 3.8 mmol/L (ref 3.5–5.1)
Sodium: 136 mmol/L (ref 135–145)

## 2024-02-18 MED ORDER — POLYETHYLENE GLYCOL 3350 17 G PO PACK
17.0000 g | PACK | Freq: Every day | ORAL | Status: DC
  Administered 2024-02-18 – 2024-02-22 (×4): 17 g via ORAL
  Filled 2024-02-18 (×7): qty 1

## 2024-02-18 MED ORDER — TRAMADOL HCL 50 MG PO TABS
50.0000 mg | ORAL_TABLET | Freq: Four times a day (QID) | ORAL | Status: DC | PRN
  Filled 2024-02-18: qty 1

## 2024-02-18 MED ORDER — FUROSEMIDE 40 MG PO TABS
40.0000 mg | ORAL_TABLET | Freq: Every day | ORAL | Status: DC
  Administered 2024-02-18 – 2024-02-24 (×7): 40 mg via ORAL
  Filled 2024-02-18 (×7): qty 1

## 2024-02-18 MED ORDER — ENOXAPARIN SODIUM 40 MG/0.4ML IJ SOSY
40.0000 mg | PREFILLED_SYRINGE | INTRAMUSCULAR | Status: DC
  Administered 2024-02-18 – 2024-02-23 (×6): 40 mg via SUBCUTANEOUS
  Filled 2024-02-18 (×6): qty 0.4

## 2024-02-18 MED ORDER — CEFAZOLIN SODIUM-DEXTROSE 2-4 GM/100ML-% IV SOLN
2.0000 g | Freq: Three times a day (TID) | INTRAVENOUS | Status: DC
  Administered 2024-02-18 – 2024-02-23 (×14): 2 g via INTRAVENOUS
  Filled 2024-02-18 (×15): qty 100

## 2024-02-18 MED ORDER — MORPHINE SULFATE (PF) 2 MG/ML IV SOLN: 1.0000 mg | INTRAVENOUS | Status: DC | PRN

## 2024-02-18 MED ORDER — ENSURE PLUS HIGH PROTEIN PO LIQD
237.0000 mL | Freq: Two times a day (BID) | ORAL | Status: DC
  Administered 2024-02-18: 237 mL via ORAL

## 2024-02-18 NOTE — Progress Notes (Signed)
 Progress Note   Patient: Ashley Mack FMW:[id number] DOB: 12-30-1945 DOA: 02/15/2024     3 DOS: the patient was seen and examined on 02/18/2024   Brief hospital course: 78 y.o. 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   Patient states that she possibly had an allergic reaction.  She states she has chronic back pain and has been taking gabapentin.  She states recently she came off gabapentin and started tramadol . Over the last week or so she has noticed increase in swelling in her lower legs and feet. She also noticed swelling and pain in right hand. Her right hand is erythematous.  She denies any injury.  No fever or chills. She also reports nausea and multiple episodes of vomiting.  Denies abdominal pain or diarrhea.  No symptoms of UTI.  Patient reports mild dry cough and SOB due to COPD which has not changed significantly.   ED physician did bedside ultrasound of her right hand, found to have right hand dorsal abscess, 10-12 cc of drainage is removed and sent out for culture.   Data reviewed independently and ED Course: pt was found to have WBC 23.9 (baseline WBC 12-15), lactic acid 0.9, potassium 5.7, sodium 128, magnesium  2.5, phosphorus 3.0, GFR> 60, proBNP 885, liver function normal.  Temperature normal, blood pressure 197/91 --> 118/87, heart rate 110s, RR 20, oxygen saturation 87% on room air, which improved to 97% on 2 L home level oxygen.  Patient is admitted to telemetry bed as inpatient.  12/3.  White blood cell count still elevated.  Hand looks a little less erythematous.  Continue antibiotics.  Patient states that she has been having some back pain and had a fall couple weeks ago.  MRI of the thoracic spine ordered.  Had some shortness  of breath. 12/4.  Reviewed MRI with patient showing a acute on chronic compression fracture of T10 and 35% vertebral height loss.  Case discussed with interventional radiology team and they will not operate on this currently while treating infection and high white count.  Physical therapy recommending rehab but patient would like to go home.  Starting IV Lasix  12/5.  Patient not tolerating oral oxycodone .  Will try oral tramadol .  Switch Lasix  over to oral.  Switch antibiotics over to IV Ancef .  Assessment and Plan: * Abscess of right hand Patient had I&D by ER physician.  White count trending better but still hurting.  Staph aureus growing out of culture and will fine-tune the antibiotics to Ancef .  Warm soaks.  Sepsis (HCC) Present on admission.  Secondary to abscess right hand.  Patient had leukocytosis and tachycardia.  Compression fracture of T10 vertebra (HCC) Patient not doing well with oral oxycodone .  Will try tramadol  instead.  Continue IV morphine .  As needed nausea medications.  Case discussed with interventional radiology team yesterday and today would not operate at this time secondary to treating infection and high white count.  Patient refused back brace.  COPD (chronic obstructive pulmonary disease) (HCC) Continue rescue inhaler and Breo Ellipta   HTN (hypertension) Continue Coreg .  Tachycardia improved.  Hyponatremia Sodium normal range.  Stopped salt tablets.  Hyperkalemia Potassium normal range  Legal blindness .  Rheumatoid arthritis involving multiple sites [facility]) On chronic prednisone   Acute on chronic diastolic CHF (congestive heart failure) (HCC) With  elevation of proBNP and swelling of the lower extremities.  Change Lasix  over to oral.  Continue Coreg .  Ejection fraction 60 to 65%.  Chronic respiratory failure with hypoxia (HCC) Continue 2 L of oxygen        Subjective: Patient's leg swelling is a little bit better.  Still having quite a bit of back  pain.  Not doing well with the oral oxycodone .  Did not do well with the Compazine  yesterday.  Physical Exam: Vitals:   02/18/24 0118 02/18/24 0414 02/18/24 0500 02/18/24 0939  BP: (!) 169/75 (!) 163/83 133/62 130/60  Pulse: (!) 105 (!) 110 (!) 110 63  Resp:  18 18 17   Temp:  98.2 F (36.8 C) 98.3 F (36.8 C) 98 F (36.7 C)  TempSrc:      SpO2: 97% 98% 98% 96%  Weight:   57 kg   Height:   5' 2 (1.575 m)    Physical Exam HENT:     Head: Normocephalic.  Eyes:     General: Lids are normal.  Cardiovascular:     Rate and Rhythm: Normal rate and regular rhythm.     Heart sounds: Normal heart sounds, S1 normal and S2 normal.  Pulmonary:     Breath sounds: Examination of the right-lower field reveals decreased breath sounds. Examination of the left-lower field reveals decreased breath sounds. Decreased breath sounds present. No wheezing, rhonchi or rales.  Abdominal:     Palpations: Abdomen is soft.     Tenderness: There is no abdominal tenderness.  Musculoskeletal:     Right lower leg: Swelling present.     Left lower leg: Swelling present.  Skin:    General: Skin is warm.     Findings: No rash.  Neurological:     Mental Status: She is alert and oriented to person, place, and time.     Data Reviewed: Creatinine 0.64, CO2 33, sodium 136, chloride 93, white blood count 19.4, hemoglobin 10.5, platelet count 478  Family Communication: Spoke with daughter over the phone  Disposition: Status is: Inpatient Remains inpatient appropriate because: Continue IV antibiotics for right hand abscess and sepsis.  Switch antibiotic over to Ancef .  Change IV Lasix  over to oral.  Discontinue oxycodone  and switch to tramadol .  Need to have an oral pain medication that she can tolerate.  Planned Discharge Destination: Rehab    Time spent: 28 minutes  Author: Charlie Patterson, MD 02/18/2024 2:57 PM  For on call review [email or web address].

## 2024-02-18 NOTE — Care Management Important Message (Signed)
 Important Message  Patient Details  Name: Ashley Mack MRN: [id number] Date of Birth: 01-05-1946   Important Message Given:  Yes - Medicare IM     Kassadie Pancake W, CMA 02/18/2024, 12:42 PM

## 2024-02-18 NOTE — Plan of Care (Signed)
  Problem: Education: Goal: Knowledge of General Education information will improve Description: Including pain rating scale, medication(s)/side effects and non-pharmacologic comfort measures Outcome: Progressing   Problem: Coping: Goal: Level of anxiety will decrease Outcome: Progressing   Problem: Elimination: Goal: Will not experience complications related to urinary retention Outcome: Progressing   Problem: Pain Managment: Goal: General experience of comfort will improve and/or be controlled Outcome: Progressing   Problem: Safety: Goal: Ability to remain free from injury will improve Outcome: Progressing

## 2024-02-18 NOTE — Plan of Care (Signed)
  Problem: Education: Goal: Knowledge of General Education information will improve Description: Including pain rating scale, medication(s)/side effects and non-pharmacologic comfort measures Outcome: Progressing   Problem: Health Behavior/Discharge Planning: Goal: Ability to manage health-related needs will improve Outcome: Progressing   Problem: Clinical Measurements: Goal: Ability to maintain clinical measurements within normal limits will improve Outcome: Progressing Goal: Will remain Ashley Mack from infection Outcome: Progressing Goal: Diagnostic test results will improve Outcome: Progressing Goal: Respiratory complications will improve Outcome: Progressing Goal: Cardiovascular complication will be avoided Outcome: Progressing   Problem: Activity: Goal: Risk for activity intolerance will decrease Outcome: Progressing   Problem: Coping: Goal: Level of anxiety will decrease Outcome: Progressing   Problem: Elimination: Goal: Will not experience complications related to bowel motility Outcome: Progressing Goal: Will not experience complications related to urinary retention Outcome: Progressing   Problem: Safety: Goal: Ability to remain Ashley Mack from injury will improve Outcome: Progressing   Problem: Skin Integrity: Goal: Risk for impaired skin integrity will decrease Outcome: Progressing   Problem: Clinical Measurements: Goal: Ability to avoid or minimize complications of infection will improve Outcome: Progressing   Problem: Skin Integrity: Goal: Skin integrity will improve Outcome: Progressing

## 2024-02-19 DIAGNOSIS — S22070S Wedge compression fracture of T9-T10 vertebra, sequela: Secondary | ICD-10-CM | POA: Diagnosis not present

## 2024-02-19 DIAGNOSIS — J439 Emphysema, unspecified: Secondary | ICD-10-CM | POA: Diagnosis not present

## 2024-02-19 DIAGNOSIS — I10 Essential (primary) hypertension: Secondary | ICD-10-CM | POA: Diagnosis not present

## 2024-02-19 DIAGNOSIS — L02511 Cutaneous abscess of right hand: Secondary | ICD-10-CM | POA: Diagnosis not present

## 2024-02-19 LAB — BASIC METABOLIC PANEL WITH GFR
Anion gap: 9 (ref 5–15)
BUN: 36 mg/dL — ABNORMAL HIGH (ref 8–23)
CO2: 35 mmol/L — ABNORMAL HIGH (ref 22–32)
Calcium: 9.1 mg/dL (ref 8.9–10.3)
Chloride: 93 mmol/L — ABNORMAL LOW (ref 98–111)
Creatinine, Ser: 0.71 mg/dL (ref 0.44–1.00)
GFR, Estimated: >60 mL/min (ref >60)
Glucose, Bld: 178 mg/dL — ABNORMAL HIGH (ref 70–99)
Potassium: 3.7 mmol/L (ref 3.5–5.1)
Sodium: 136 mmol/L (ref 135–145)

## 2024-02-19 LAB — CBC
HCT: 32.7 % — ABNORMAL LOW (ref 36.0–46.0)
Hemoglobin: 10.6 g/dL — ABNORMAL LOW (ref 12.0–15.0)
MCH: 28.6 pg (ref 26.0–34.0)
MCHC: 32.4 g/dL (ref 30.0–36.0)
MCV: 88.4 fL (ref 80.0–100.0)
Platelets: 445 K/uL — ABNORMAL HIGH (ref 150–400)
RBC: 3.7 MIL/uL — ABNORMAL LOW (ref 3.87–5.11)
RDW: 15.8 % — ABNORMAL HIGH (ref 11.5–15.5)
WBC: 15.7 K/uL — ABNORMAL HIGH (ref 4.0–10.5)
nRBC: 0 % (ref 0.0–0.2)

## 2024-02-19 NOTE — Plan of Care (Signed)

## 2024-02-19 NOTE — Plan of Care (Signed)
   Problem: Education: Goal: Knowledge of General Education information will improve Description Including pain rating scale, medication(s)/side effects and non-pharmacologic comfort measures Outcome: Progressing

## 2024-02-19 NOTE — Consult Note (Signed)
 ORTHOPAEDIC CONSULTATION  REQUESTING PHYSICIAN: Josette Ade, MD  Chief Complaint:   Right dorsal hand pain and swelling.  History of Present Illness: Ashley Mack is an 78 y.o. female with multiple medical problems including gastroesophageal reflux disease, osteoporosis, rheumatoid arthritis, migraines, centrilobular emphysema, autoimmune retinopathy, anxiety, anemia, and fatty liver who presented to the emergency room 4 days ago complaining of worsening back pain.  She had been seen at [facility] urgent care clinic and diagnosed with a compression fracture of her lumbar spine.  Because of her pain, she was referred to the emergency room.  Upon arrival to the emergency room, her right hand was noted to have significant swelling and erythema.  The ER provider performed a local I&D over the the dorsum of her hand and was able to express 12 to 15 cc of purulent material.  The patient subsequent was admitted for IV antibiotics.  The hand was noted to have some persistent swelling, tenderness, and erythema so further orthopedic input was requested.  The patient recalls bumping the back of her hand against the underside of a wheelchair arm prior to admission, and wonders if this may have been how the infection began.  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    Iron  (Fe) deficiency anemia    Migraine headache    in past   Migraines    Motion sickness    No natural teeth    Osteoporosis    Rheumatoid arthritis (HCC)    Vertigo    Past Surgical History:  Procedure Laterality Date   CATARACT EXTRACTION  W/PHACO Right 08/12/2016   Procedure: CATARACT EXTRACTION PHACO AND INTRAOCULAR LENS PLACEMENT (IOC)  Right;  Surgeon: Mittie Gaskin, MD;  Location: [facility];  Service: Ophthalmology;  Laterality: Right;   CATARACT EXTRACTION W/PHACO Left 09/30/2016   Procedure: CATARACT EXTRACTION PHACO AND INTRAOCULAR LENS PLACEMENT (IOC) Left;  Surgeon: Mittie Gaskin, MD;  Location: [facility];  Service: Ophthalmology;  Laterality: Left;   COLONOSCOPY     COLONOSCOPY WITH PROPOFOL  N/A 12/09/2016   Procedure: COLONOSCOPY WITH PROPOFOL ;  Surgeon: Toledo, Ladell POUR, MD;  Location: [facility];  Service: Endoscopy;  Laterality: N/A;   ESOPHAGOGASTRODUODENOSCOPY     ESOPHAGOGASTRODUODENOSCOPY (EGD) WITH PROPOFOL  N/A 12/09/2016   Procedure: ESOPHAGOGASTRODUODENOSCOPY (EGD) WITH PROPOFOL ;  Surgeon: Toledo, Ladell POUR, MD;  Location: [facility] END[facility];  Service: Endoscopy;  Laterality: N/A;   KYPHOPLASTY N/A 08/10/2017   Procedure: LYNN;  Surgeon: Kathlynn Sharper, MD;  Location: [facility];  Service: Orthopedics;  Laterality: N/A;   KYPHOPLASTY N/A 09/09/2017   Procedure: XBEYNEOJDUB-O8,O5;  Surgeon: Kathlynn Sharper, MD;  Location: [facility];  Service: Orthopedics;  Laterality: N/A;   KYPHOPLASTY N/A 09/28/2017   Procedure: XBEYNEOJDUB-U88;  Surgeon: Kathlynn Sharper, MD;  Location: [facility];  Service: Orthopedics;  Laterality: N/A;   KYPHOPLASTY N/A 06/19/2019   Procedure: KYPHOPLASTY T5;  Surgeon: Kathlynn Sharper, MD;  Location: [facility];  Service: Orthopedics;  Laterality: N/A;   OOPHORECTOMY Bilateral 2003   RIGHT/LEFT HEART CATH AND CORONARY ANGIOGRAPHY Bilateral 10/19/2019   Procedure: RIGHT/LEFT HEART CATH AND CORONARY  ANGIOGRAPHY;  Surgeon: Florencio Cara BIRCH, MD;  Location: [facility];  Service: Cardiovascular;  Laterality: Bilateral;   Social History   Socioeconomic History   Marital status: Widowed    Spouse name: Not on file   Number of children: Not on file   Years of  education: Not on file   Highest education level: Not on file  Occupational History   Not on file  Tobacco Use   Smoking status: Former    Current packs/day: 0.00    Types: Cigarettes    Quit date: 2009    Years since quitting: 16.9   Smokeless tobacco: Never  Vaping Use   Vaping status: Never Used  Substance and Sexual Activity   Alcohol use: No   Drug use: No   Sexual activity: Not Currently  Other Topics Concern   Not on file  Social History Narrative   Not on file   Social Drivers of Health   Financial Resource Strain: Low Risk  (11/18/2023)   Received from [facility]   Overall Financial Resource Strain (CARDIA)    Difficulty of Paying Living Expenses: Not hard at all  Food Insecurity: No Food Insecurity (02/16/2024)   Hunger Vital Sign    Worried About Running Out of Food in the Last Year: Never true    Ran Out of Food in the Last Year: Never true  Transportation Needs: No Transportation Needs (02/16/2024)   PRAPARE - Administrator, Civil Service (Medical): No    Lack of Transportation (Non-Medical): No  Physical Activity: Not on file  Stress: Not on file  Social Connections: Moderately Isolated (02/16/2024)   Social Connection and Isolation Panel    Frequency of Communication with Friends and Family: More than three times a week    Frequency of Social Gatherings with Friends and Family: More than three times a week    Attends Religious Services: 1 to 4 times per year    Active Member of [employer] or Organizations: No    Attends Banker Meetings: Never    Marital Status: Widowed   Family History  Problem Relation Age of Onset   Kidney disease Mother    Diabetes Mellitus II Brother    Allergies  Allergen Reactions   Methotrexate And Trimetrexate Nausea And Vomiting and Other (See Comments)    Flu like symptoms   Other Nausea Only and Other (See Comments)    Arthritis medications . Unsure of what the med was.   Gabapentin     Prochlorperazine  Other (See Comments)    Patient reported medication made her feel off,not good. She said she wasn't able to describe the feeling any better.She asked not to take the medication anymore.    Ranitidine Hives   Sulfasalazine Other (See Comments)    Stomach upset   Topiramate Nausea Only   Prior to Admission medications   Medication Sig Start Date End Date Taking? Authorizing Provider  albuterol  (VENTOLIN  HFA) 108 (90 Base) MCG/ACT inhaler Inhale 1-2 puffs into the lungs every 4 (four) hours as needed for wheezing or shortness of breath. 06/01/23  Yes Van Knee, MD  budesonide -formoterol  (SYMBICORT) 160-4.5 MCG/ACT inhaler Inhale 2 puffs into the lungs 2 (two) times daily.   Yes [provider]  Cholecalciferol  (VITAMIN D3) 2000 UNITS capsule Take 2,000 Units by mouth daily.    Yes [provider]  cyanocobalamin  1000 MCG tablet Take 1,000 mg by mouth daily.   Yes [provider]  cyclobenzaprine  (FLEXERIL ) 5 MG tablet Take 2.5-5 mg by mouth 3 (three) times daily as needed for muscle spasms.   Yes [provider]  diclofenac  Sodium (VOLTAREN ) 1 % GEL Apply 2 g topically as needed.   Yes [provider]  Fe Fum-FePoly-Vit C-Vit B3 (INTEGRA) 62.5-62.5-40-3 MG CAPS Take 1 capsule by mouth daily. 11/29/13  Yes [provider]  Fluocinolone Acetonide 0.01 % OIL Place 2-4 drops into both ears at bedtime as needed (itching). Max usage 10 days per month   Yes [provider]  fluticasone  (FLONASE ) 50 MCG/ACT nasal spray Place into both nostrils daily.   Yes [provider]  magnesium  oxide (MAG-OX) 400 MG tablet Take 400 mg by mouth daily.   Yes [provider]  ondansetron  (ZOFRAN -ODT) 4 MG disintegrating tablet Take 4 mg by mouth. 10/21/23  Yes [provider]  predniSONE  (DELTASONE ) 5 MG tablet Take 5 mg by mouth daily.    Yes [provider]  traMADol  (ULTRAM ) 50 MG tablet Take  50-100 mg by mouth every 6 (six) hours. 02/14/24  Yes [provider]  traZODone  (DESYREL ) 50 MG tablet Take 50-100 mg by mouth. 11/18/23 11/17/24 Yes [provider]  Turmeric (QC TUMERIC COMPLEX) 500 MG CAPS Take 1 tablet by mouth daily.   Yes [provider]  dexlansoprazole (DEXILANT) 60 MG capsule Take 60 mg by mouth daily. Patient not taking: Reported on 02/16/2024    [provider]  Spacer/Aero-Holding Chambers (AEROCHAMBER MV) inhaler Use as instructed 06/01/23   Van Knee, MD   No results found.  Positive ROS: All other systems have been reviewed and were otherwise negative with the exception of those mentioned in the HPI and as above.  Physical Exam: General:  Alert, no acute distress Psychiatric:  Patient is competent for consent with normal mood and affect   Cardiovascular:  No pedal edema Respiratory:  No wheezing, non-labored breathing GI:  Abdomen is soft and non-tender Skin:  No lesions in the area of chief complaint Neurologic:  Sensation intact distally Lymphatic:  No axillary or cervical lymphadenopathy  Orthopedic Exam:  Orthopedic examination is limited to the right hand.  There is moderate swelling and erythema over the dorsal aspect of the hand with what appears to be a loculated fluid beneath, consistent with residual abscess.  There also was an area of scabbing over the dorsal aspect of the hand consistent with her history of a recent I&D of this area in the emergency room.  She has moderate pain to palpation in this area.  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  She is grossly neurovascularly intact all digits.  X-rays:  Recent x-rays of the right hand are available for review and have been reviewed by myself.  These films demonstrate no evidence of any acute bony abnormalities.  There is  moderate soft tissue swelling over the dorsal aspect of the hand, consistent with her area of abscess.  Assessment: Right dorsal hand abscess.  Plan: The treatment options have been discussed with the patient.  The patient is DNR and is adamant about avoiding general anesthesia if at all possible.  Therefore, she consents to a repeat I&D at the bedside.  This was done using sterile technique.    The hand was cleansed with Betadine solution and alcohol before topical anesthesia was provided using ethyl chloride spray.  The  old scab was elevated off of the dorsum of the hand using an 18-gauge needle.  The previous I&D site was then reopened and approximately 5 to 6 cc of thick purulent material was expressed from the dorsum of the hand.  A corner of a sterile gauze was inserted into the wound to act as a wick to try to keep the wound from sealing over again.  A sterile bulky dressing was then applied to the hand.  The patient tolerated the procedure well.  I recommend that she continue with her present IV antibiotic regimen for at least the next 48 to 72 hours before considering switching her over to oral antibiotics.  The patient may be mobilized as tolerated but is to avoid any weightbearing through the right hand.  Thank you for asking me to participate in the care of this most delightful woman.  I will be happy to follow her with you.   DOROTHA Reyes Maltos, MD  Beeper #:  [phone]  02/19/2024 3:51 PM

## 2024-02-19 NOTE — Progress Notes (Signed)
 Progress Note   Patient: Ashley Mack FMW:[id number] DOB: 1945/09/05 DOA: 02/15/2024     4 DOS: the patient was seen and examined on 02/19/2024   Brief hospital course: 78 y.o. 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   Patient states that she possibly had an allergic reaction.  She states she has chronic back pain and has been taking gabapentin.  She states recently she came off gabapentin and started tramadol . Over the last week or so she has noticed increase in swelling in her lower legs and feet. She also noticed swelling and pain in right hand. Her right hand is erythematous.  She denies any injury.  No fever or chills. She also reports nausea and multiple episodes of vomiting.  Denies abdominal pain or diarrhea.  No symptoms of UTI.  Patient reports mild dry cough and SOB due to COPD which has not changed significantly.   ED physician did bedside ultrasound of her right hand, found to have right hand dorsal abscess, 10-12 cc of drainage is removed and sent out for culture.   Data reviewed independently and ED Course: pt was found to have WBC 23.9 (baseline WBC 12-15), lactic acid 0.9, potassium 5.7, sodium 128, magnesium  2.5, phosphorus 3.0, GFR> 60, proBNP 885, liver function normal.  Temperature normal, blood pressure 197/91 --> 118/87, heart rate 110s, RR 20, oxygen saturation 87% on room air, which improved to 97% on 2 L home level oxygen.  Patient is admitted to telemetry bed as inpatient.  12/3.  White blood cell count still elevated.  Hand looks a little less erythematous.  Continue antibiotics.  Patient states that she has been having some back pain and had a fall couple weeks ago.  MRI of the thoracic spine ordered.  Had some shortness  of breath. 12/4.  Reviewed MRI with patient showing a acute on chronic compression fracture of T10 and 35% vertebral height loss.  Case discussed with interventional radiology team and they will not operate on this currently while treating infection and high white count.  Physical therapy recommending rehab but patient would like to go home.  Starting IV Lasix  12/5.  Patient not tolerating oral oxycodone .  Will try oral tramadol .  Switch Lasix  over to oral.  Switch antibiotics over to IV Ancef . 12/6.  White blood cell count coming down to 15.7.  Right hand little puffy.  Scab over the area that was previously I&D by ER physician.  Assessment and Plan: * Abscess of right hand Patient had I&D by ER physician.  White count trending better but still hurting.  Staph aureus growing out of culture and will fine-tune the antibiotics to Ancef  on 12/5.  Warm soaks.  Will consult orthopedic surgery to evaluate on whether the right hand needs another I&D or not.  Sepsis (HCC) Present on admission.  Secondary to abscess right hand.  Patient had leukocytosis and tachycardia.  Compression fracture of T10 vertebra (HCC) Patient not doing well with oral oxycodone  or tramadol  continue IV morphine .  As needed nausea medications.  Case discussed with interventional radiology team the other day and would not operate at this time secondary to treating infection and high white count.  Patient refused back brace.  Diclofenac  gel.  COPD (chronic obstructive pulmonary disease) (HCC) Continue rescue inhaler and Breo Ellipta   HTN (hypertension) Continue Coreg .  Tachycardia improved.  Hyponatremia Sodium normal range.  Stopped salt tablets.  Hyperkalemia Potassium normal range  Normocytic anemia Left hemoglobin 10.6  Legal blindness .  Rheumatoid arthritis involving multiple sites [facility]) On chronic prednisone   Acute on chronic diastolic CHF (congestive heart failure) (HCC) With elevation of proBNP and  swelling of the lower extremities.  Continue oral Lasix .  Continue Coreg .  Ejection fraction 60 to 65%.  Chronic respiratory failure with hypoxia (HCC) Continue 2 L of oxygen        Subjective: Patient wanted to get a back surgery while she is here.  I advised her that the interventional radiology team will not do a back surgery while I am treating the infection and her white count is high.  Having some pain in her hand today.  Little puffy.  Admitted with right hand abscess.  Physical Exam: Vitals:   02/18/24 2229 02/19/24 0430 02/19/24 0500 02/19/24 0753  BP: (!) 154/90 (!) 160/76  (!) 171/96  Pulse: (!) 105 98  100  Resp:  17  18  Temp:  98.2 F (36.8 C)  98.3 F (36.8 C)  TempSrc:  Oral    SpO2:  100%  100%  Weight:   58.4 kg   Height:       Physical Exam HENT:     Head: Normocephalic.  Eyes:     General: Lids are normal.  Cardiovascular:     Rate and Rhythm: Normal rate and regular rhythm.     Heart sounds: Normal heart sounds, S1 normal and S2 normal.  Pulmonary:     Breath sounds: Examination of the right-lower field reveals decreased breath sounds. Examination of the left-lower field reveals decreased breath sounds. Decreased breath sounds present. No wheezing, rhonchi or rales.  Abdominal:     Palpations: Abdomen is soft.     Tenderness: There is no abdominal tenderness.  Musculoskeletal:     Right lower leg: Swelling present.     Left lower leg: Swelling present.     Comments: Right hand swelling and puffy.  Area of abscess drainage has scabbed over.  Skin:    General: Skin is warm.     Findings: No rash.  Neurological:     Mental Status: She is alert and oriented to person, place, and time.     Data Reviewed: White blood cell count down to 15.7, platelet count 445, hemoglobin 10.6, creatinine 0.7, BUN 36, CO2 35, chloride 93 Family Communication: Updated patient's daughter on the phone  Disposition: Status is: Inpatient Remains inpatient appropriate  because: Continue IV antibiotics.  Will have orthopedic surgery evaluate to see if hand needs another I&D.  Planned Discharge Destination: Rehab    Time spent: 28 minutes Orthopedic surgery to evaluate whether right hand needs another I&D or not.  Author: Charlie Patterson, MD 02/19/2024 1:20 PM  For on call review [email or web address].

## 2024-02-19 NOTE — Assessment & Plan Note (Signed)
Last hemoglobin 10.3

## 2024-02-20 DIAGNOSIS — J439 Emphysema, unspecified: Secondary | ICD-10-CM | POA: Diagnosis not present

## 2024-02-20 DIAGNOSIS — S22070S Wedge compression fracture of T9-T10 vertebra, sequela: Secondary | ICD-10-CM | POA: Diagnosis not present

## 2024-02-20 DIAGNOSIS — L02511 Cutaneous abscess of right hand: Secondary | ICD-10-CM | POA: Diagnosis not present

## 2024-02-20 DIAGNOSIS — R112 Nausea with vomiting, unspecified: Secondary | ICD-10-CM

## 2024-02-20 DIAGNOSIS — I10 Essential (primary) hypertension: Secondary | ICD-10-CM | POA: Diagnosis not present

## 2024-02-20 LAB — AEROBIC/ANAEROBIC CULTURE W GRAM STAIN (SURGICAL/DEEP WOUND)

## 2024-02-20 LAB — CULTURE, BLOOD (ROUTINE X 2)
Culture: NO GROWTH
Special Requests: ADEQUATE

## 2024-02-20 MED ORDER — PREDNISONE 10 MG PO TABS
5.0000 mg | ORAL_TABLET | Freq: Every day | ORAL | Status: DC
  Administered 2024-02-21 – 2024-02-24 (×4): 5 mg via ORAL
  Filled 2024-02-20 (×4): qty 1

## 2024-02-20 NOTE — Plan of Care (Signed)
°  Problem: Education: °Goal: Knowledge of General Education information will improve °Description: Including pain rating scale, medication(s)/side effects and non-pharmacologic comfort measures °Outcome: Progressing °  °Problem: Health Behavior/Discharge Planning: °Goal: Ability to manage health-related needs will improve °Outcome: Progressing °  °Problem: Clinical Measurements: °Goal: Ability to maintain clinical measurements within normal limits will improve °Outcome: Progressing °Goal: Will remain free from infection °Outcome: Progressing °  °Problem: Nutrition: °Goal: Adequate nutrition will be maintained °Outcome: Progressing °  °Problem: Coping: °Goal: Level of anxiety will decrease °Outcome: Progressing °  °Problem: Safety: °Goal: Ability to remain free from injury will improve °Outcome: Progressing °  °

## 2024-02-20 NOTE — Plan of Care (Signed)

## 2024-02-20 NOTE — Progress Notes (Signed)
 Patient ID: Ashley Mack, female   DOB: 1945/07/18, 78 y.o.   MRN: [id number]  Subjective: Overall, the patient feels that her hand symptoms have improved since undergoing the bedside I&D yesterday.  She still notes some discomfort, but feels that this discomfort is much improved.  She has no new complaints pertaining to her right hand.   Objective: Vital signs in last 24 hours: Temp:  [97.8 F (36.6 C)-98.3 F (36.8 C)] 98.3 F (36.8 C) (12/07 0817) Pulse Rate:  [93-109] 99 (12/07 0817) Resp:  [15-18] 18 (12/07 0817) BP: (155-180)/(79-96) 156/79 (12/07 0817) SpO2:  [98 %-100 %] 100 % (12/07 0817) Weight:  [59.1 kg] 59.1 kg (12/07 0500)  Intake/Output from previous day: 12/06 0701 - 12/07 0700 In: 283.4 [P.O.:150; IV Piggyback:133.4] Out: -  Intake/Output this shift: No intake/output data recorded.  Recent Labs    02/18/24 0554 02/19/24 0444  HGB 10.5* 10.6*   Recent Labs    02/18/24 0554 02/19/24 0444  WBC 19.4* 15.7*  RBC 3.65* 3.70*  HCT 32.6* 32.7*  PLT 478* 445*   Recent Labs    02/18/24 0554 02/19/24 0444  NA 136 136  K 3.8 3.7  CL 93* 93*  CO2 33* 35*  BUN 23 36*  CREATININE 0.64 0.71  GLUCOSE 90 178*  CALCIUM  9.0 9.1   No results for input(s): LABPT, INR in the last 72 hours.  Physical Exam: Orthopedic examination again is limited to the right hand.  The dressing over the dorsum of the hand appears to be dry and intact.  She is able to flex and extend her fingers without pain or catching, although again her range of motion is decreased due to significant arthritic changes of all digits.  She is grossly neurovascularly intact to all digits of her right hand.  Assessment: Right dorsal hand abscess.  Plan: The treatment options have been reviewed with the patient.  Overall, the patient feels that her symptoms are improved following the bedside I&D of the right dorsal hand abscess.  She is to continue to receive IV antibiotics, and may be mobilized  with physical therapy so long as she avoids any weightbearing through the right wrist or hand.  We will plan on resuming daily dressing changes tomorrow.   Norleen PARAS Raden Byington 02/20/2024, 10:08 AM

## 2024-02-20 NOTE — Assessment & Plan Note (Signed)
 On Zofran .

## 2024-02-20 NOTE — Progress Notes (Addendum)
 Physical Therapy Treatment Patient Details Name: Ashley Mack MRN: [id number] DOB: 03-04-1946 Today's Date: 02/20/2024   History of Present Illness Ashley Mack is a 78 y.o. female with medical history significant of blindness, HTN, COPD on 2L O2, dCHF, GERD, iron  deficiency anemia, anxiety, chronic back pain, migraine, rheumatoid arthritis with finger deformity on prednisone  chronically, chronic leukocytosis, who presents with right hand swelling, redness and pain.Patient has T10 compression fx.    PT Comments  Pt in recliner this am.  Stated she is generally feeling better.  She stated she has been up for about an hour but is nauseous and not feeling well.  She is able to stand to RW with min a x 1 and transfer back to bed with same assist.  Overall she did well with mobility and could have likely walked further is not limited by nausea.  Will continue to encourage mobility but at this time < 3 hrs a day therapy at discharge is appropriate.     If plan is discharge home, recommend the following: A little help with walking and/or transfers;A little help with bathing/dressing/bathroom;Assist for transportation;Help with stairs or ramp for entrance;Assistance with cooking/housework   Can travel by private Automotive Engineer (2 wheels);BSC/3in1    Recommendations for Other Services       Precautions / Restrictions Precautions Precautions: Back;Fall Recall of Precautions/Restrictions: Intact Restrictions Weight Bearing Restrictions Per Provider Order: No     Mobility  Bed Mobility Overal bed mobility: Needs Assistance Bed Mobility: Sit to Supine       Sit to supine: Min assist     Patient Response: Cooperative  Transfers Overall transfer level: Needs assistance Equipment used: Rolling walker (2 wheels) Transfers: Sit to/from Stand Sit to Stand: Min assist                Ambulation/Gait Ambulation/Gait assistance: Min  Chemical Engineer (Feet): 3 Feet Assistive device: Rolling walker (2 wheels) Gait Pattern/deviations: Step-to pattern Gait velocity: dec     General Gait Details: takes fair steps to chair.  could likely have done more but pt stated nausea and I'm sick being limiting factors   Stairs             Wheelchair Mobility     Tilt Bed Tilt Bed Patient Response: Cooperative  Modified Rankin (Stroke Patients Only)       Balance Overall balance assessment: Needs assistance Sitting-balance support: Feet supported Sitting balance-Leahy Scale: Good     Standing balance support: Bilateral upper extremity supported Standing balance-Leahy Scale: Fair                              Hotel Manager: No apparent difficulties  Cognition Arousal: Alert Behavior During Therapy: WFL for tasks assessed/performed   PT - Cognitive impairments: No apparent impairments                         Following commands: Intact      Cueing Cueing Techniques: Verbal cues  Exercises      General Comments        Pertinent Vitals/Pain Pain Assessment Pain Assessment: Faces Faces Pain Scale: Hurts little more Pain Location: back  and R hand Pain Descriptors / Indicators: Aching, Moaning, Grimacing, Guarding, Sharp Pain Intervention(s): Limited activity within patient's tolerance, Monitored during session, Repositioned    Home  Living                          Prior Function            PT Goals (current goals can now be found in the care plan section) Progress towards PT goals: Progressing toward goals    Frequency    Min 2X/week      PT Plan      Co-evaluation              AM-PAC PT 6 Clicks Mobility   Outcome Measure  Help needed turning from your back to your side while in a flat bed without using bedrails?: A Little Help needed moving from lying on your back to sitting on the side of a flat bed  without using bedrails?: A Little Help needed moving to and from a bed to a chair (including a wheelchair)?: A Little Help needed standing up from a chair using your arms (e.g., wheelchair or bedside chair)?: A Little Help needed to walk in hospital room?: A Little Help needed climbing 3-5 steps with a railing? : A Lot 6 Click Score: 17    End of Session Equipment Utilized During Treatment: Oxygen Activity Tolerance: Patient limited by pain Patient left: in bed;with call bell/phone within reach;with bed alarm set Nurse Communication: Mobility status PT Visit Diagnosis: Other abnormalities of gait and mobility (R26.89);Pain     Time: 8863-8853 PT Time Calculation (min) (ACUTE ONLY): 10 min  Charges:    $Therapeutic Activity: 8-22 mins PT General Charges $$ ACUTE PT VISIT: 1 Visit                   Lauraine Gills, PTA 02/20/24, 12:58 PM

## 2024-02-20 NOTE — Progress Notes (Signed)
 Progress Note   Patient: Ashley Mack FMW:[id number] DOB: May 23, 1945 DOA: 02/15/2024     5 DOS: the patient was seen and examined on 02/20/2024   Brief hospital course: 78 y.o. 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   Patient states that she possibly had an allergic reaction.  She states she has chronic back pain and has been taking gabapentin.  She states recently she came off gabapentin and started tramadol . Over the last week or so she has noticed increase in swelling in her lower legs and feet. She also noticed swelling and pain in right hand. Her right hand is erythematous.  She denies any injury.  No fever or chills. She also reports nausea and multiple episodes of vomiting.  Denies abdominal pain or diarrhea.  No symptoms of UTI.  Patient reports mild dry cough and SOB due to COPD which has not changed significantly.   ED physician did bedside ultrasound of her right hand, found to have right hand dorsal abscess, 10-12 cc of drainage is removed and sent out for culture.   Data reviewed independently and ED Course: pt was found to have WBC 23.9 (baseline WBC 12-15), lactic acid 0.9, potassium 5.7, sodium 128, magnesium  2.5, phosphorus 3.0, GFR> 60, proBNP 885, liver function normal.  Temperature normal, blood pressure 197/91 --> 118/87, heart rate 110s, RR 20, oxygen saturation 87% on room air, which improved to 97% on 2 L home level oxygen.  Patient is admitted to telemetry bed as inpatient.  12/3.  White blood cell count still elevated.  Hand looks a little less erythematous.  Continue antibiotics.  Patient states that she has been having some back pain and had a fall couple weeks ago.  MRI of the thoracic spine ordered.  Had some shortness  of breath. 12/4.  Reviewed MRI with patient showing a acute on chronic compression fracture of T10 and 35% vertebral height loss.  Case discussed with interventional radiology team and they will not operate on this currently while treating infection and high white count.  Physical therapy recommending rehab but patient would like to go home.  Starting IV Lasix  12/5.  Patient not tolerating oral oxycodone .  Will try oral tramadol .  Switch Lasix  over to oral.  Switch antibiotics over to IV Ancef . 12/6.  White blood cell count coming down to 15.7.  Orthopedic surgery did a another I&D right hand. 12/7.  Patient complains of pain in the right hand.  Assessment and Plan: * Abscess of right hand Patient had I&D by ER physician.  White count trending better but still hurting.  Staph aureus growing out of culture and will fine-tune the antibiotics to Ancef  on 12/5.  Orthopedic surgery did another I&D on 12/6.  Recommended antibiotics for another 2 to 3 days IV from drainage.  Sepsis (HCC) Present on admission.  Secondary to abscess right hand.  Patient had leukocytosis and tachycardia.  Looks like her white count is always a little bit elevated.  White blood cell count 28.9 on admission and down to 15.7.  Compression fracture of T10 vertebra (HCC) Patient not doing well with oral oxycodone  or tramadol .  Continue Tylenol  and as needed IV morphine .  As needed nausea medications.  Case discussed with interventional radiology team and they will not operate at this time secondary  to treating infection and high white count.  Patient refused back brace.  Diclofenac  gel.  COPD (chronic obstructive pulmonary disease) (HCC) Continue rescue inhaler and Breo Ellipta   HTN (hypertension) Continue Coreg .  Tachycardia improved.  Nausea & vomiting On Zofran   Hyponatremia Sodium normal range.  Stopped salt tablets.  Hyperkalemia Potassium normal range  Normocytic anemia Left hemoglobin 10.6  Legal  blindness .  Rheumatoid arthritis involving multiple sites [facility]) On chronic prednisone   Acute on chronic diastolic CHF (congestive heart failure) (HCC) With elevation of proBNP and swelling of the lower extremities.  Continue oral Lasix .  Continue Coreg .  Ejection fraction 60 to 65%.  Patient resistant on any other medications for blood pressure or heart.  Chronic respiratory failure with hypoxia (HCC) Continue 2 L of oxygen        Subjective: Patient very concerned about any new medications.  Patient still has some swelling of her legs.  Patient complains of pain in her hand.  Looks like she was able to stand up today with physical therapy.  Physical Exam: Vitals:   02/19/24 2057 02/20/24 0438 02/20/24 0500 02/20/24 0817  BP: (!) 155/83 (!) 180/96  (!) 156/79  Pulse: 93 (!) 104  99  Resp: 18 15  18   Temp: 98 F (36.7 C) 97.8 F (36.6 C)  98.3 F (36.8 C)  TempSrc:      SpO2: 100% 98%  100%  Weight:   59.1 kg   Height:       Physical Exam HENT:     Head: Normocephalic.  Eyes:     General: Lids are normal.  Cardiovascular:     Rate and Rhythm: Normal rate and regular rhythm.     Heart sounds: Normal heart sounds, S1 normal and S2 normal.  Pulmonary:     Breath sounds: Examination of the right-lower field reveals decreased breath sounds. Examination of the left-lower field reveals decreased breath sounds. Decreased breath sounds present. No wheezing, rhonchi or rales.  Abdominal:     Palpations: Abdomen is soft.     Tenderness: There is no abdominal tenderness.  Musculoskeletal:     Right lower leg: Swelling present.     Left lower leg: Swelling present.     Comments: Right hand covered  Skin:    General: Skin is warm.     Findings: No rash.  Neurological:     Mental Status: She is alert and oriented to person, place, and time.     Data Reviewed: Creatinine 0.71, last white blood cell count 15.7  Family Communication: Spoke with daughter on the  phone  Disposition: Status is: Inpatient Remains inpatient appropriate because: Orthopedic surgery recommended continuing antibiotics and other 48 hours  Planned Discharge Destination: Physical therapy recommending rehab but patient will likely want to go home    Time spent: 28 minutes  Author: Charlie Patterson, MD 02/20/2024 1:43 PM  For on call review [email or web address]

## 2024-02-21 ENCOUNTER — Inpatient Hospital Stay

## 2024-02-21 LAB — BASIC METABOLIC PANEL WITH GFR
Anion gap: 7 (ref 5–15)
BUN: 38 mg/dL — ABNORMAL HIGH (ref 8–23)
CO2: 38 mmol/L — ABNORMAL HIGH (ref 22–32)
Calcium: 9.6 mg/dL (ref 8.9–10.3)
Chloride: 89 mmol/L — ABNORMAL LOW (ref 98–111)
Creatinine, Ser: 0.79 mg/dL (ref 0.44–1.00)
GFR, Estimated: >60 mL/min (ref >60)
Glucose, Bld: 97 mg/dL (ref 70–99)
Potassium: 5 mmol/L (ref 3.5–5.1)
Sodium: 134 mmol/L — ABNORMAL LOW (ref 135–145)

## 2024-02-21 LAB — CULTURE, BLOOD (ROUTINE X 2)
Culture  Setup Time: NO GROWTH
Report Status: NO GROWTH
Special Requests: ADEQUATE

## 2024-02-21 LAB — CBC
HCT: 35 % — ABNORMAL LOW (ref 36.0–46.0)
Hemoglobin: 11.1 g/dL — ABNORMAL LOW (ref 12.0–15.0)
MCH: 28 pg (ref 26.0–34.0)
MCHC: 31.7 g/dL (ref 30.0–36.0)
MCV: 88.4 fL (ref 80.0–100.0)
Platelets: 422 K/uL — ABNORMAL HIGH (ref 150–400)
RBC: 3.96 MIL/uL (ref 3.87–5.11)
RDW: 15.7 % — ABNORMAL HIGH (ref 11.5–15.5)
WBC: 13.4 K/uL — ABNORMAL HIGH (ref 4.0–10.5)
nRBC: 0 % (ref 0.0–0.2)

## 2024-02-21 NOTE — Progress Notes (Signed)
 Physical Therapy Treatment Patient Details Name: Ashley Mack MRN: [id number] DOB: May 08, 1945 Today's Date: 02/21/2024   History of Present Illness Ashley Mack is a 78 y.o. female with medical history significant of blindness, HTN, COPD on 2L O2, dCHF, GERD, iron  deficiency anemia, anxiety, chronic back pain, migraine, rheumatoid arthritis with finger deformity on prednisone  chronically, chronic leukocytosis, who presents with right hand swelling, redness and pain.Patient has T10 compression fx.    PT Comments  Pt ready to get up.  She is able to get to EOB with rails and inc time.  Motivated to try on her own.  Once sitting she is steady.  She is fearful to use RW as she stated she fell using one several years ago and just holds onto objects in her home for mobility.  She does stand with RW with encouragement for pericare due to inc urine.  She does not feel comfortable letting to for her own self care and needs cga/min a x 1 for balance and full assist for bathing care.  She is able to step to recliner with RW frequently stating Don't let me fall during transitions and again during walk with +2 HHA about 40' in room when OT arrives.  She remains in recliner with OT in room to assist with further ADL care.  Pt remains with decreased mobility, balance and pain.  She would benefit from continued therapies at discharge < 3 hrs a day.  If she refuses it is highly recommended that she have +24 hour care at home and all [facility] services and supports available to her.    If plan is discharge home, recommend the following: A little help with walking and/or transfers;A little help with bathing/dressing/bathroom;Assist for transportation;Help with stairs or ramp for entrance;Assistance with cooking/housework   Can travel by private Automotive Engineer (2 wheels);BSC/3in1    Recommendations for Other Services       Precautions / Restrictions  Precautions Precautions: Back;Fall Recall of Precautions/Restrictions: Intact Restrictions Weight Bearing Restrictions Per Provider Order: No     Mobility  Bed Mobility Overal bed mobility: Needs Assistance Bed Mobility: Supine to Sit Rolling: Contact guard assist, Used rails           Patient Response: Cooperative  Transfers Overall transfer level: Needs assistance Equipment used: 2 person hand held assist Transfers: Sit to/from Stand Sit to Stand: Contact guard assist                Ambulation/Gait Ambulation/Gait assistance: Min assist, +2 safety/equipment   Assistive device: 2 person hand held assist Gait Pattern/deviations: Step-through pattern Gait velocity: dec     General Gait Details: 72' wiht HHA x 2 - pt fearful of falling and of using RW   Stairs             Wheelchair Mobility     Tilt Bed Tilt Bed Patient Response: Cooperative  Modified Rankin (Stroke Patients Only)       Balance Overall balance assessment: Needs assistance Sitting-balance support: Feet supported Sitting balance-Leahy Scale: Good     Standing balance support: Bilateral upper extremity supported Standing balance-Leahy Scale: Fair                              Hotel Manager: No apparent difficulties  Cognition Arousal: Alert Behavior During Therapy: WFL for tasks assessed/performed   PT - Cognitive impairments:  Safety/Judgement                       PT - Cognition Comments: fearful of falling Following commands: Intact      Cueing Cueing Techniques: Verbal cues  Exercises      General Comments        Pertinent Vitals/Pain Pain Assessment Pain Assessment: Faces Faces Pain Scale: Hurts little more Pain Location: back  and R hand Pain Descriptors / Indicators: Aching, Grimacing, Guarding, Sharp Pain Intervention(s): Limited activity within patient's tolerance, Monitored during session, PCA  encouraged, Repositioned    Home Living                          Prior Function            PT Goals (current goals can now be found in the care plan section) Progress towards PT goals: Progressing toward goals    Frequency    Min 2X/week      PT Plan      Co-evaluation PT/OT/SLP Co-Evaluation/Treatment: Yes Reason for Co-Treatment: Necessary to address cognition/behavior during functional activity;For patient/therapist safety;To address functional/ADL transfers PT goals addressed during session: Mobility/safety with mobility OT goals addressed during session: ADL's and self-care      AM-PAC PT 6 Clicks Mobility   Outcome Measure  Help needed turning from your back to your side while in a flat bed without using bedrails?: A Little Help needed moving from lying on your back to sitting on the side of a flat bed without using bedrails?: A Little Help needed moving to and from a bed to a chair (including a wheelchair)?: A Little Help needed standing up from a chair using your arms (e.g., wheelchair or bedside chair)?: A Little Help needed to walk in hospital room?: A Little Help needed climbing 3-5 steps with a railing? : A Lot 6 Click Score: 17    End of Session Equipment Utilized During Treatment: Oxygen Activity Tolerance: Patient tolerated treatment well;Patient limited by pain Patient left: in chair;with chair alarm set;with call bell/phone within reach Nurse Communication: Mobility status PT Visit Diagnosis: Other abnormalities of gait and mobility (R26.89);Pain     Time: 9185-9174 PT Time Calculation (min) (ACUTE ONLY): 11 min  Charges:    $Gait Training: 8-22 mins PT General Charges $$ ACUTE PT VISIT: 1 Visit                   Lauraine Gills, PTA 02/21/24, 9:50 AM

## 2024-02-21 NOTE — Plan of Care (Signed)
   Problem: Health Behavior/Discharge Planning: Goal: Ability to manage health-related needs will improve Outcome: Progressing

## 2024-02-21 NOTE — Progress Notes (Signed)
 Subjective:   Right dorsal hand abscess, S/P bedside I&D on 02/19/24 Patient reports pain as moderate.  Reports it is still feeling better but more pain compared to yesterday. Patient is well, and has had no acute complaints or problems Plan is to go Home after hospital stay. Negative for chest pain and shortness of breath Fever: No recent fevers. Gastrointestinal:Negative for nausea and vomiting  Objective: Vital signs in last 24 hours: Temp:  [98.1 F (36.7 C)-98.3 F (36.8 C)] 98.1 F (36.7 C) (12/08 0506) Pulse Rate:  [93-107] 96 (12/08 0506) Resp:  [14-19] 14 (12/08 0506) BP: (148-162)/(67-79) 162/79 (12/08 0506) SpO2:  [99 %-100 %] 99 % (12/08 0506) Weight:  [59.1 kg] 59.1 kg (12/08 0506)  Intake/Output from previous day:  Intake/Output Summary (Last 24 hours) at 02/21/2024 0731 Last data filed at 02/21/2024 0506 Gross per 24 hour  Intake 420 ml  Output 1900 ml  Net -1480 ml    Intake/Output this shift: No intake/output data recorded.  Labs: Recent Labs    02/19/24 0444  HGB 10.6*   Recent Labs    02/19/24 0444  WBC 15.7*  RBC 3.70*  HCT 32.7*  PLT 445*   Recent Labs    02/19/24 0444  NA 136  K 3.7  CL 93*  CO2 35*  BUN 36*  CREATININE 0.71  GLUCOSE 178*  CALCIUM  9.1   No results for input(s): LABPT, INR in the last 72 hours.   EXAM General - Patient is Alert, Appropriate, and Oriented Extremity - Dressing intact to the right hand. Dressing removed this AM, small incision site noted.  Continue swelling to dorsal aspect of the hand, no purulence is noted.  No purulent material can be expressed. Intact to light touch to the hand.   Limited motion due to h/o RA. New dressing applied.   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    Iron  (Fe) deficiency anemia    Migraine headache    in past   Migraines    Motion sickness    No natural teeth    Osteoporosis    Rheumatoid arthritis (HCC)    Vertigo     Assessment/Plan:    Principal Problem:   Abscess of right hand Active Problems:   Normocytic anemia   Acute mid back pain   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HCC)   Sepsis (HCC)   HTN (hypertension)   COPD (chronic obstructive pulmonary disease) (HCC)   Legal blindness   Hyperkalemia   Hyponatremia   Nausea & vomiting   Chronic respiratory failure with hypoxia (HCC)   Compression fracture of T10 vertebra (HCC)   Acute on chronic diastolic CHF (congestive heart failure) (HCC)  Estimated body mass index is 23.83 kg/m as calculated from the following:   Height as of this encounter: 5' 2 (1.575 m).   Weight as of this encounter: 59.1 kg. Advance diet Up with therapy  Vitals reviewed, no recent fever. CBC ordered for this AM. Dressing change performed. Culture positive for staph aureus.  Currently on Ancef . Eventually transition to oral  Abx. Follow-up with [facility] orthopaedics for skin check later this week vs. Early next week.  DVT Prophylaxis - Lovenox  and TED hose NWB to the right hand.  DOROTHA Gustavo Level, PA-C [facility] 02/21/2024, 7:31 AM

## 2024-02-21 NOTE — Progress Notes (Signed)
 Progress Note   Patient: Ashley Mack FMW:[id number] DOB: May 25, 1945 DOA: 02/15/2024     6 DOS: the patient was seen and examined on 02/21/2024   Brief hospital course: 78 y.o. 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   Patient states that she possibly had an allergic reaction.  She states she has chronic back pain and has been taking gabapentin.  She states recently she came off gabapentin and started tramadol . Over the last week or so she has noticed increase in swelling in her lower legs and feet. She also noticed swelling and pain in right hand. Her right hand is erythematous.  She denies any injury.  No fever or chills. She also reports nausea and multiple episodes of vomiting.  Denies abdominal pain or diarrhea.  No symptoms of UTI.  Patient reports mild dry cough and SOB due to COPD which has not changed significantly.   ED physician did bedside ultrasound of her right hand, found to have right hand dorsal abscess, 10-12 cc of drainage is removed and sent out for culture.   Data reviewed independently and ED Course: pt was found to have WBC 23.9 (baseline WBC 12-15), lactic acid 0.9, potassium 5.7, sodium 128, magnesium  2.5, phosphorus 3.0, GFR> 60, proBNP 885, liver function normal.  Temperature normal, blood pressure 197/91 --> 118/87, heart rate 110s, RR 20, oxygen saturation 87% on room air, which improved to 97% on 2 L home level oxygen.  Patient is admitted to telemetry bed as inpatient.  12/3.  White blood cell count still elevated.  Hand looks a little less erythematous.  Continue antibiotics.  Patient states that she has been having some back pain and had a fall couple weeks ago.  MRI of the thoracic spine ordered.  Had some shortness  of breath. 12/4.  Reviewed MRI with patient showing a acute on chronic compression fracture of T10 and 35% vertebral height loss.  Case discussed with interventional radiology team and they will not operate on this currently while treating infection and high white count.  Physical therapy recommending rehab but patient would like to go home.  Starting IV Lasix  12/5.  Patient not tolerating oral oxycodone .  Will try oral tramadol .  Switch Lasix  over to oral.  Switch antibiotics over to IV Ancef . 12/6.  White blood cell count coming down to 15.7.  Orthopedic surgery did a another I&D right hand. 12/7.  Patient complains of pain in the right hand. 12/8.  White blood cell count down to 13.4.  This is probably her normal.  Continue IV antibiotics today.  Reassess tomorrow  Assessment and Plan: * Abscess of right hand Patient had I&D by ER physician.  White count trending better but still hurting.  Staph aureus growing out of culture and will fine-tune the antibiotics to Ancef  on 12/5.  Orthopedic surgery did another I&D on 12/6.  Continue IV antibiotics today and reassess tomorrow.  Sepsis (HCC) Present on admission.  Secondary to abscess right hand.  Patient had leukocytosis and tachycardia.  Looks like her white count is always a little bit elevated.  White blood cell count 28.9 on admission and down to 13.4 (probably her normal).  Compression fracture of T10 vertebra (HCC) Patient not doing well with oral oxycodone  or tramadol .  Continue Tylenol  and as needed IV  morphine .  As needed nausea medications.  Case discussed with interventional radiology team and they will not operate at this time secondary to treating infection and high white count.  Patient refused back brace.  Diclofenac  gel.  COPD (chronic obstructive pulmonary disease) (HCC) Continue rescue inhaler and Breo Ellipta   HTN (hypertension) Continue Coreg .  Tachycardia improved.  Nausea & vomiting On Zofran   Hyponatremia Sodium  normal range.  Stopped salt tablets.  Hyperkalemia Potassium normal range  Normocytic anemia Last hemoglobin 11.1  Legal blindness .  Rheumatoid arthritis involving multiple sites [facility]) On chronic prednisone   Acute on chronic diastolic CHF (congestive heart failure) (HCC) With elevation of proBNP and swelling of the lower extremities.  Continue oral Lasix .  Continue Coreg .  Ejection fraction 60 to 65%.  Patient resistant on any other medications for blood pressure or heart.  Sonogram lower extremities negative for DVT  Chronic respiratory failure with hypoxia (HCC) Continue 2 L of oxygen        Subjective: Patient now adamant that she wants to go home.  She states she is not taking pain meds she is using the diclofenac  for her back.  She wants to have surgery.  I told her it is not going to happen since we are treating infection.  Admitted with right hand infection  Physical Exam: Vitals:   02/20/24 1731 02/20/24 2118 02/21/24 0506 02/21/24 0749  BP: (!) 148/67 (!) 158/73 (!) 162/79 (!) 147/91  Pulse: (!) 107 93 96 94  Resp: 18 19 14 18   Temp: 98.1 F (36.7 C) 98.1 F (36.7 C) 98.1 F (36.7 C) 98.1 F (36.7 C)  TempSrc:      SpO2: 100% 99% 99% 100%  Weight:   59.1 kg   Height:       Physical Exam HENT:     Head: Normocephalic.  Eyes:     General: Lids are normal.  Cardiovascular:     Rate and Rhythm: Normal rate and regular rhythm.     Heart sounds: Normal heart sounds, S1 normal and S2 normal.  Pulmonary:     Breath sounds: Examination of the right-lower field reveals decreased breath sounds. Examination of the left-lower field reveals decreased breath sounds. Decreased breath sounds present. No wheezing, rhonchi or rales.  Abdominal:     Palpations: Abdomen is soft.     Tenderness: There is no abdominal tenderness.  Musculoskeletal:     Right lower leg: Swelling present.     Left lower leg: Swelling present.     Comments: Right hand covered  Skin:     General: Skin is warm.     Findings: No rash.  Neurological:     Mental Status: She is alert and oriented to person, place, and time.     Data Reviewed: Ultrasound negative for DVT, white blood cell count 13.4, hemoglobin 11.1, platelet count 422, creatinine 0.79, potassium 5.0, sodium 134  Family Communication: Spoke with daughter on the phone  Disposition: Status is: Inpatient Remains inpatient appropriate because: Patient will need to be stronger before going home.  Continue with IV antibiotics while here.  Planned Discharge Destination: Home with Home Health physical therapy recommending rehab but patient would like to go home with home health    Time spent: 28 minutes  Author: Charlie Patterson, MD 02/21/2024 1:43 PM  For on call review [email or web address].

## 2024-02-21 NOTE — Plan of Care (Signed)
  Problem: Education: Goal: Knowledge of General Education information will improve Description Including pain rating scale, medication(s)/side effects and non-pharmacologic comfort measures Outcome: Progressing   Problem: Clinical Measurements: Goal: Ability to maintain clinical measurements within normal limits will improve Outcome: Progressing Goal: Will remain free from infection Outcome: Progressing   Problem: Nutrition: Goal: Adequate nutrition will be maintained Outcome: Progressing   Problem: Safety: Goal: Ability to remain free from injury will improve Outcome: Progressing   

## 2024-02-21 NOTE — Progress Notes (Signed)
 Occupational Therapy Treatment Patient Details Name: Ashley Mack MRN: [id number] DOB: 24-Nov-1945 Today's Date: 02/21/2024   History of present illness Ashley Mack is a 78 y.o. female with medical history significant of blindness, HTN, COPD on 2L O2, dCHF, GERD, iron  deficiency anemia, anxiety, chronic back pain, migraine, rheumatoid arthritis with finger deformity on prednisone  chronically, chronic leukocytosis, who presents with right hand swelling, redness and pain.Patient has T10 compression fx.   OT comments  Pt seen for overlapping co-tx with PT to progress mobility. Pt received in recliner with PTA and agreeable to session. Pt endorsing back pain that wraps around her rib cage but denies need for pain meds or further intervention from nursing. Pt required VC for hand placement to push up from the recliner to stand with handheld assist x2 for walking short bouts back/forth in room. Pt tolerated well. Returned to sitting and completed grooming tasks with set up. Pt edu in compensatory strategies for grooming tasks to limit the length of time she removes her nasal canula. Pt politely declined to trial herself. Will continue to progress as able.       If plan is discharge home, recommend the following:  Assist for transportation;Assistance with cooking/housework;Help with stairs or ramp for entrance;A lot of help with bathing/dressing/bathroom;A lot of help with walking and/or transfers   Equipment Recommendations  BSC/3in1;Wheelchair (measurements OT);Wheelchair cushion (measurements OT)    Recommendations for Other Services      Precautions / Restrictions Precautions Precautions: Back;Fall Recall of Precautions/Restrictions: Intact Restrictions Weight Bearing Restrictions Per Provider Order: No       Mobility Bed Mobility               General bed mobility comments: NT, up in recliner pre and post OT session    Transfers Overall transfer level: Needs  assistance Equipment used: 2 person hand held assist Transfers: Sit to/from Stand Sit to Stand: Contact guard assist           General transfer comment: VC for hand placement     Balance Overall balance assessment: Needs assistance Sitting-balance support: Feet supported Sitting balance-Leahy Scale: Good     Standing balance support: Bilateral upper extremity supported Standing balance-Leahy Scale: Fair                             ADL either performed or assessed with clinical judgement   ADL Overall ADL's : Needs assistance/impaired     Grooming: Sitting;Wash/dry face;Wash/dry hands;Set up                                      Extremity/Trunk Assessment              Vision       Perception     Praxis     Communication Communication Communication: No apparent difficulties   Cognition Arousal: Alert Behavior During Therapy: WFL for tasks assessed/performed Cognition: No apparent impairments                               Following commands: Intact        Cueing   Cueing Techniques: Verbal cues  Exercises Other Exercises Other Exercises: Pt edu in compensatory strategies for grooming tasks to limit the length of time she removes her nasal canula. Other Exercises: Pt  amb with 2 person handheld assist in room back and forth, ~6' x4    Shoulder Instructions       General Comments      Pertinent Vitals/ Pain       Pain Assessment Pain Assessment: Faces Faces Pain Scale: Hurts little more Pain Location: back  and R hand Pain Descriptors / Indicators: Aching, Grimacing, Guarding, Sharp Pain Intervention(s): Limited activity within patient's tolerance, Monitored during session, Repositioned  Home Living                                          Prior Functioning/Environment              Frequency  Min 2X/week        Progress Toward Goals  OT Goals(current goals can now be  found in the care plan section)  Progress towards OT goals: Progressing toward goals  Acute Rehab OT Goals Patient Stated Goal: have less pain OT Goal Formulation: With patient Time For Goal Achievement: 03/02/24 Potential to Achieve Goals: Good  Plan      Co-evaluation    PT/OT/SLP Co-Evaluation/Treatment: Yes Reason for Co-Treatment: Necessary to address cognition/behavior during functional activity;For patient/therapist safety;To address functional/ADL transfers PT goals addressed during session: Mobility/safety with mobility OT goals addressed during session: ADL's and self-care      AM-PAC OT 6 Clicks Daily Activity     Outcome Measure   Help from another person eating meals?: None Help from another person taking care of personal grooming?: A Little Help from another person toileting, which includes using toliet, bedpan, or urinal?: A Lot Help from another person bathing (including washing, rinsing, drying)?: A Lot Help from another person to put on and taking off regular upper body clothing?: A Little Help from another person to put on and taking off regular lower body clothing?: A Lot 6 Click Score: 16    End of Session Equipment Utilized During Treatment: Oxygen  OT Visit Diagnosis: Other abnormalities of gait and mobility (R26.89);Muscle weakness (generalized) (M62.81);History of falling (Z91.81);Pain Pain - Right/Left: Right Pain - part of body: Hand (and back)   Activity Tolerance Patient tolerated treatment well   Patient Left in chair;with call bell/phone within reach;with chair alarm set   Nurse Communication Mobility status        Time: 9177-9162 OT Time Calculation (min): 15 min  Charges: OT General Charges $OT Visit: 1 Visit OT Treatments $Self Care/Home Management : 8-22 mins  Warren SAUNDERS., MPH, MS, OTR/L ascom [phone] 02/21/24, 8:47 AM

## 2024-02-22 DIAGNOSIS — M0579 Rheumatoid arthritis with rheumatoid factor of multiple sites without organ or systems involvement: Secondary | ICD-10-CM

## 2024-02-22 DIAGNOSIS — A4101 Sepsis due to Methicillin susceptible Staphylococcus aureus: Secondary | ICD-10-CM

## 2024-02-22 LAB — CBC
HCT: 32.2 % — ABNORMAL LOW (ref 36.0–46.0)
Hemoglobin: 10.3 g/dL — ABNORMAL LOW (ref 12.0–15.0)
MCH: 28.3 pg (ref 26.0–34.0)
MCHC: 32 g/dL (ref 30.0–36.0)
MCV: 88.5 fL (ref 80.0–100.0)
Platelets: 387 K/uL (ref 150–400)
RBC: 3.64 MIL/uL — ABNORMAL LOW (ref 3.87–5.11)
RDW: 15.6 % — ABNORMAL HIGH (ref 11.5–15.5)
WBC: 13.7 K/uL — ABNORMAL HIGH (ref 4.0–10.5)
nRBC: 0 % (ref 0.0–0.2)

## 2024-02-22 MED ORDER — ACETAMINOPHEN 325 MG PO TABS
650.0000 mg | ORAL_TABLET | Freq: Four times a day (QID) | ORAL | Status: DC | PRN
  Administered 2024-02-22: 650 mg via ORAL
  Filled 2024-02-22: qty 2

## 2024-02-22 NOTE — Plan of Care (Signed)

## 2024-02-22 NOTE — Progress Notes (Signed)
 Physical Therapy Treatment Patient Details Name: Ashley Mack MRN: [id number] DOB: 11-30-1945 Today's Date: 02/22/2024   History of Present Illness Ashley Mack is a 78 y.o. female with medical history significant of blindness, HTN, COPD on 2L O2, dCHF, GERD, iron  deficiency anemia, anxiety, chronic back pain, migraine, rheumatoid arthritis with finger deformity on prednisone  chronically, chronic leukocytosis, who presents with right hand swelling, redness and pain.Patient has T10 compression fx.    PT Comments  Pt in chair.  Reports she has been up about an hour and a half.  She stand with cga/min a x 1 and walks 10' generally anxious about falling with HHA +1.  Sits in chair for +2 to be called where she walks and additional 20' then 10' with HHA +2 and limited by pain and tight breathing.  Sats WFL on 2 lpm O2.  Opts to return to bed after session with needs met.  Long discussion regarding discharge plan. She continues to remain firm about refusing SNF despite being aware she will struggle at home and only has intermittent help from family.  Discussed her fear of walking with +1 assist and requesting +2 here she attributes fear to tile floors (grip socks on) and low vision which makes her nervous as she does not know where things are.  She stated she has carpet at home and knows where to hold onto things.  Has O2 at home and stated she is able to manage cording at not trip.  She is limited by pain and respiratory status along with fear here.  She is encouraged to have clear discussion with family in regards to needing increased support at home upon discharge as she may be medically ready within the next day or so.  She voiced understanding.  Pt will struggle at home if +1 is not available with fall likely.  I will not fall is her response.  Concerns remain for discharge home unattended.  She has been educated on concerns and risks/benefits of SNF vs home and remains firm in her decision to return  home.   If plan is discharge home, recommend the following: A little help with walking and/or transfers;A little help with bathing/dressing/bathroom;Assist for transportation;Help with stairs or ramp for entrance;Assistance with cooking/housework   Can travel by private vehicle        Equipment Recommendations  BSC/3in1 (has RW at home)    Recommendations for Other Services       Precautions / Restrictions Precautions Precautions: Back;Fall Recall of Precautions/Restrictions: Intact Restrictions Weight Bearing Restrictions Per Provider Order: No     Mobility  Bed Mobility Overal bed mobility: Needs Assistance Bed Mobility: Sit to Supine     Supine to sit: Mod assist     General bed mobility comments: for Le's.  sleeps in recliner at home Patient Response: Cooperative, Anxious  Transfers Overall transfer level: Needs assistance Equipment used: 2 person hand held assist Transfers: Sit to/from Stand Sit to Stand: Contact guard assist                Ambulation/Gait Ambulation/Gait assistance: Contact guard assist, Min assist Gait Distance (Feet): 20 Feet Assistive device: 2 person hand held assist Gait Pattern/deviations: Step-through pattern Gait velocity: dec     General Gait Details: does not trust RW, uses finger tough on objects at home.   Stairs             Wheelchair Mobility     Tilt Bed Tilt Bed Patient Response: Cooperative, Anxious  Modified Rankin (Stroke Patients Only)       Balance Overall balance assessment: Needs assistance Sitting-balance support: Feet supported Sitting balance-Leahy Scale: Good     Standing balance support: Bilateral upper extremity supported Standing balance-Leahy Scale: Fair                              Hotel Manager: No apparent difficulties  Cognition Arousal: Alert Behavior During Therapy: WFL for tasks assessed/performed   PT - Cognitive impairments:  Safety/Judgement                       PT - Cognition Comments: fearful of falling Following commands: Intact      Cueing Cueing Techniques: Verbal cues  Exercises      General Comments        Pertinent Vitals/Pain Pain Assessment Pain Assessment: Faces Faces Pain Scale: Hurts whole lot Pain Location: back primarily with mobility Pain Descriptors / Indicators: Aching, Grimacing, Guarding, Sharp Pain Intervention(s): Limited activity within patient's tolerance, Monitored during session, Premedicated before session, Repositioned    Home Living                          Prior Function            PT Goals (current goals can now be found in the care plan section) Progress towards PT goals: Progressing toward goals    Frequency    Min 2X/week      PT Plan      Co-evaluation              AM-PAC PT 6 Clicks Mobility   Outcome Measure  Help needed turning from your back to your side while in a flat bed without using bedrails?: A Little Help needed moving from lying on your back to sitting on the side of a flat bed without using bedrails?: A Little Help needed moving to and from a bed to a chair (including a wheelchair)?: A Little Help needed standing up from a chair using your arms (e.g., wheelchair or bedside chair)?: A Little Help needed to walk in hospital room?: A Little Help needed climbing 3-5 steps with a railing? : A Lot 6 Click Score: 17    End of Session Equipment Utilized During Treatment: Oxygen Activity Tolerance: Patient tolerated treatment well;Patient limited by pain Patient left: in chair;with call bell/phone within reach;in bed;with bed alarm set Nurse Communication: Mobility status PT Visit Diagnosis: Other abnormalities of gait and mobility (R26.89);Pain     Time: 9054-8988 PT Time Calculation (min) (ACUTE ONLY): 26 min  Charges:    $Gait Training: 8-22 mins $Therapeutic Activity: 8-22 mins PT General  Charges $$ ACUTE PT VISIT: 1 Visit                   Lauraine Gills, PTA 02/22/24, 10:22 AM

## 2024-02-22 NOTE — Progress Notes (Addendum)
 Subjective:   Right dorsal hand abscess, S/P bedside I&D on 02/19/24 Patient reports pain as mild this AM, reports hand pain is much improved. Patient is well, and has had no acute complaints or problems Plan is to go Home after hospital stay. Negative for chest pain and shortness of breath Fever: No recent fevers. Gastrointestinal:Negative for nausea and vomiting  Objective: Vital signs in last 24 hours: Temp:  [98.1 F (36.7 C)-98.4 F (36.9 C)] 98.1 F (36.7 C) (12/09 0408) Pulse Rate:  [94-99] 96 (12/09 0408) Resp:  [16-18] 17 (12/09 0408) BP: (147-167)/(78-91) 159/78 (12/09 0408) SpO2:  [99 %-100 %] 99 % (12/09 0408)  Intake/Output from previous day:  Intake/Output Summary (Last 24 hours) at 02/22/2024 0722 Last data filed at 02/22/2024 0408 Gross per 24 hour  Intake 480 ml  Output 2600 ml  Net -2120 ml    Intake/Output this shift: No intake/output data recorded.  Labs: Recent Labs    02/21/24 0905  HGB 11.1*   Recent Labs    02/21/24 0905  WBC 13.4*  RBC 3.96  HCT 35.0*  PLT 422*   Recent Labs    02/21/24 0905  NA 134*  K 5.0  CL 89*  CO2 38*  BUN 38*  CREATININE 0.79  GLUCOSE 97  CALCIUM  9.6   No results for input(s): LABPT, INR in the last 72 hours.   EXAM General - Patient is Alert, Appropriate, and Oriented Extremity - Dressing intact to the right hand. Dressing removed this AM, small incision site noted.  Improved swelling to dorsal aspect of the hand, no purulence is noted.  No purulent material can be expressed. Intact to light touch to the hand.   Limited motion due to h/o RA and ulnar nerve injury. New dressing applied.   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    Iron  (Fe) deficiency anemia    Migraine headache    in past   Migraines    Motion sickness    No natural teeth    Osteoporosis    Rheumatoid arthritis (HCC)    Vertigo     Assessment/Plan:    Principal Problem:   Abscess of right hand Active Problems:   Normocytic anemia   Acute mid back pain   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HCC)   Sepsis (HCC)   HTN (hypertension)   COPD (chronic obstructive pulmonary disease) (HCC)   Legal blindness   Hyperkalemia   Hyponatremia   Nausea & vomiting   Chronic respiratory failure with hypoxia (HCC)   Compression fracture of T10 vertebra (HCC)   Acute on chronic diastolic CHF (congestive heart failure) (HCC)  Estimated body mass index is 23.83 kg/m as calculated from the following:   Height as of this encounter: 5' 2 (1.575 m).   Weight as of this encounter: 59.1 kg. Advance diet Up with therapy  Vitals reviewed, no recent fever. CBC ordered for this AM.  WBC 13.4 yesterday. Dressing change performed. Culture positive for staph aureus.  Currently on Ancef . I believe it is reasonable to transition to oral  ABX today and monitor with possible d/c home tomorrow. Follow-up with [facility] orthopaedics for skin check later this week vs. Early next week.  DVT Prophylaxis - Lovenox  and TED hose NWB to the right hand.  DOROTHA Gustavo Level, PA-C [facility] Orthopaedic Surgery 02/22/2024, 7:22 AM

## 2024-02-22 NOTE — Progress Notes (Signed)
 Progress Note   Patient: Ashley Mack FMW:[id number] DOB: [birth year] DOA: 02/15/2024     7 DOS: the patient was seen and examined on 02/22/2024   Brief hospital course: 78 y.o. 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   Patient states that she possibly had an allergic reaction.  She states she has chronic back pain and has been taking gabapentin.  She states recently she came off gabapentin and started tramadol . Over the last week or so she has noticed increase in swelling in her lower legs and feet. She also noticed swelling and pain in right hand. Her right hand is erythematous.  She denies any injury.  No fever or chills. She also reports nausea and multiple episodes of vomiting.  Denies abdominal pain or diarrhea.  No symptoms of UTI.  Patient reports mild dry cough and SOB due to COPD which has not changed significantly.   ED physician did bedside ultrasound of her right hand, found to have right hand dorsal abscess, 10-12 cc of drainage is removed and sent out for culture.   Data reviewed independently and ED Course: pt was found to have WBC 23.9 (baseline WBC 12-15), lactic acid 0.9, potassium 5.7, sodium 128, magnesium  2.5, phosphorus 3.0, GFR> 60, proBNP 885, liver function normal.  Temperature normal, blood pressure 197/91 --> 118/87, heart rate 110s, RR 20, oxygen saturation 87% on room air, which improved to 97% on 2 L home level oxygen.  Patient is admitted to telemetry bed as inpatient.  12/3.  White blood cell count still elevated.  Hand looks a little less erythematous.  Continue antibiotics.  Patient states that she has been having some back pain and had a fall couple weeks ago.  MRI of the thoracic spine ordered.  Had some shortness  of breath. 12/4.  Reviewed MRI with patient showing a acute on chronic compression fracture of T10 and 35% vertebral height loss.  Case discussed with interventional radiology team and they will not operate on this currently while treating infection and high white count.  Physical therapy recommending rehab but patient would like to go home.  Starting IV Lasix  12/5.  Patient not tolerating oral oxycodone .  Will try oral tramadol .  Switch Lasix  over to oral.  Switch antibiotics over to IV Ancef . 12/6.  White blood cell count coming down to 15.7.  Orthopedic surgery did a another I&D right hand. 12/7.  Patient complains of pain in the right hand. 12/8.  White blood cell count down to 13.4.  This is probably her normal.  Continue IV antibiotics today.  Reassess tomorrow. 12/9.  Cleared by orthopedic surgery to switch over to oral antibiotics upon discharge.  Patient complaining of feeling of food getting caught in her throat.  Patient complaining of severe back pain.  Physical therapist very concerned about her going home alone.  Patient declining rehab.  Assessment and Plan: * Abscess of right hand Patient had I&D by ER physician.  Staph aureus growing out of culture and will fine-tune the antibiotics to Ancef  on 12/5.  Orthopedic surgery did another I&D on 12/6.  Orthopedic surgery gave clearance to change over to oral antibiotics (will treat for 7 days from 12/6).  White blood cell count improved to likely her normal.  Since the patient will remain in the hospital  today we will keep IV since patient is nauseous.  Sepsis (HCC) Present on admission.  Secondary to abscess right hand, has Staph aureus growing out of culture.  Patient had leukocytosis and tachycardia.  Looks like her white count is always a little bit elevated.  White blood cell count 28.9 on admission and down to 13.7 (probably her normal range).  Compression fracture of T10 vertebra (HCC) Patient did not do well with oral oxycodone  or  tramadol .  Continue Tylenol  and as needed IV morphine .  As needed nausea medications.  Case discussed with interventional radiology team (in the beginning of the hospital course) and they will not operate at this time secondary to treating infection and high white count.  Patient refused back brace.  Diclofenac  gel.  Patient interested in going home and not out to rehab.  Physical therapy is concerned about her living home alone and not doing well at home.  Acute on chronic diastolic CHF (congestive heart failure) (HCC) With elevation of proBNP and swelling of the lower extremities.  Continue oral Lasix .  Continue Coreg .  Ejection fraction 60 to 65%.  Patient resistant on any other medications for blood pressure or heart.  Sonogram lower extremities negative for DVT  COPD (chronic obstructive pulmonary disease) (HCC) Continue rescue inhaler and Breo Ellipta   HTN (hypertension) Continue Coreg .  Tachycardia improved.  Nausea & vomiting On Zofran   Hyponatremia Last sodium 1 point less than the normal range  Hyperkalemia Potassium normal range  Normocytic anemia Last hemoglobin 10.3  Legal blindness .  Rheumatoid arthritis involving multiple sites [facility]) On chronic prednisone   Chronic respiratory failure with hypoxia (HCC) Continue 2 L of oxygen        Subjective: Patient complains of severe back pain but unable to take any medications besides Tylenol  and diclofenac  gel.  Physical therapy concerned about patient going home alone but patient refusing rehab.  Physical Exam: Vitals:   02/21/24 1616 02/21/24 2030 02/22/24 0408 02/22/24 0836  BP: (!) 151/87 (!) 167/80 (!) 159/78 (!) 161/77  Pulse: 98 99 96 96  Resp: 16 18 17 18   Temp: 98.4 F (36.9 C) 98.1 F (36.7 C) 98.1 F (36.7 C) 98 F (36.7 C)  TempSrc:   Oral   SpO2: 99% 99% 99% 99%  Weight:      Height:       Physical Exam HENT:     Head: Normocephalic.  Eyes:     General: Lids are normal.  Cardiovascular:      Rate and Rhythm: Normal rate and regular rhythm.     Heart sounds: Normal heart sounds, S1 normal and S2 normal.  Pulmonary:     Breath sounds: Examination of the right-lower field reveals decreased breath sounds. Examination of the left-lower field reveals decreased breath sounds. Decreased breath sounds present. No wheezing, rhonchi or rales.  Abdominal:     Palpations: Abdomen is soft.     Tenderness: There is no abdominal tenderness.  Musculoskeletal:     Right lower leg: Swelling present.     Left lower leg: Swelling present.     Comments: Right hand covered Lower extremity swelling has improved during the hospital course  Skin:    General: Skin is warm.     Findings: No rash.  Neurological:     Mental Status: She is alert and oriented to person, place, and time.     Data Reviewed: White blood cell count 13.7, hemoglobin 10.3, platelet count 387  Family Communication: Left message for daughter  Disposition: Status is: Inpatient Remains inpatient appropriate because: Patient refusing rehab and wants to go home.  Lives by herself.  Physical therapy nervous about her going home by herself.  Will leave antibiotic IV while here since patient has nausea.  Planned Discharge Destination: Home with Home Health    Time spent: 28 minutes  Author: Charlie Patterson, MD 02/22/2024 1:34 PM  For on call review [email or web address].

## 2024-02-22 NOTE — Plan of Care (Signed)
°  Problem: Education: °Goal: Knowledge of General Education information will improve °Description: Including pain rating scale, medication(s)/side effects and non-pharmacologic comfort measures °Outcome: Progressing °  °Problem: Health Behavior/Discharge Planning: °Goal: Ability to manage health-related needs will improve °Outcome: Progressing °  °Problem: Clinical Measurements: °Goal: Ability to maintain clinical measurements within normal limits will improve °Outcome: Progressing °Goal: Will remain free from infection °Outcome: Progressing °  °Problem: Nutrition: °Goal: Adequate nutrition will be maintained °Outcome: Progressing °  °Problem: Coping: °Goal: Level of anxiety will decrease °Outcome: Progressing °  °Problem: Safety: °Goal: Ability to remain free from injury will improve °Outcome: Progressing °  °

## 2024-02-23 LAB — BASIC METABOLIC PANEL WITH GFR
Anion gap: 7 (ref 5–15)
BUN: 39 mg/dL — ABNORMAL HIGH (ref 8–23)
CO2: 38 mmol/L — ABNORMAL HIGH (ref 22–32)
Calcium: 9.3 mg/dL (ref 8.9–10.3)
Chloride: 91 mmol/L — ABNORMAL LOW (ref 98–111)
Creatinine, Ser: 0.8 mg/dL (ref 0.44–1.00)
GFR, Estimated: >60 mL/min (ref >60)
Glucose, Bld: 152 mg/dL — ABNORMAL HIGH (ref 70–99)
Potassium: 4.2 mmol/L (ref 3.5–5.1)
Sodium: 136 mmol/L (ref 135–145)

## 2024-02-23 LAB — CBC
HCT: 31.5 % — ABNORMAL LOW (ref 36.0–46.0)
Hemoglobin: 10 g/dL — ABNORMAL LOW (ref 12.0–15.0)
MCH: 28.6 pg (ref 26.0–34.0)
MCHC: 31.7 g/dL (ref 30.0–36.0)
MCV: 90 fL (ref 80.0–100.0)
Platelets: 360 K/uL (ref 150–400)
RBC: 3.5 MIL/uL — ABNORMAL LOW (ref 3.87–5.11)
RDW: 15.6 % — ABNORMAL HIGH (ref 11.5–15.5)
WBC: 14.7 K/uL — ABNORMAL HIGH (ref 4.0–10.5)
nRBC: 0 % (ref 0.0–0.2)

## 2024-02-23 MED ORDER — CEPHALEXIN 500 MG PO CAPS
500.0000 mg | ORAL_CAPSULE | Freq: Three times a day (TID) | ORAL | Status: DC
  Administered 2024-02-23 – 2024-02-24 (×3): 500 mg via ORAL
  Filled 2024-02-23 (×3): qty 1

## 2024-02-23 MED ORDER — ONDANSETRON 4 MG PO TBDP
8.0000 mg | ORAL_TABLET | Freq: Three times a day (TID) | ORAL | Status: DC | PRN
  Administered 2024-02-23 – 2024-02-24 (×3): 8 mg via ORAL
  Filled 2024-02-23 (×3): qty 2

## 2024-02-23 NOTE — Progress Notes (Signed)
 Subjective:   Right dorsal hand abscess, S/P bedside I&D on 02/19/24 Patient reports pain as mild this AM, reports hand pain is much improved. Patient is well, and has had no acute complaints or problems Plan is to go Home after hospital stay.  PT recommending SNF but patient is wants to return home. Negative for chest pain and shortness of breath Fever: No recent fevers. Gastrointestinal:Negative for nausea and vomiting  Objective: Vital signs in last 24 hours: Temp:  [97.2 F (36.2 C)-98 F (36.7 C)] 97.6 F (36.4 C) (12/10 0438) Pulse Rate:  [90-96] 91 (12/10 0438) Resp:  [18-19] 19 (12/10 0438) BP: (154-166)/(66-85) 154/66 (12/10 0438) SpO2:  [99 %-100 %] 100 % (12/10 0438)  Intake/Output from previous day:  Intake/Output Summary (Last 24 hours) at 02/23/2024 0752 Last data filed at 02/23/2024 0500 Gross per 24 hour  Intake 240 ml  Output 600 ml  Net -360 ml    Intake/Output this shift: No intake/output data recorded.  Labs: Recent Labs    02/21/24 0905 02/22/24 0754 02/23/24 0538  HGB 11.1* 10.3* 10.0*   Recent Labs    02/22/24 0754 02/23/24 0538  WBC 13.7* 14.7*  RBC 3.64* 3.50*  HCT 32.2* 31.5*  PLT 387 360   Recent Labs    02/21/24 0905  NA 134*  K 5.0  CL 89*  CO2 38*  BUN 38*  CREATININE 0.79  GLUCOSE 97  CALCIUM  9.6   No results for input(s): LABPT, INR in the last 72 hours.   EXAM General - Patient is Alert, Appropriate, and Oriented Extremity - Dressing intact to the right hand. Swelling continues to improve in the hand.  No purulent drainage noted. Intact to light touch to the hand.   Limited motion due to h/o RA and ulnar nerve injury. ACE wrap intact.  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    Iron  (Fe) deficiency anemia    Migraine headache    in past   Migraines    Motion sickness    No natural teeth    Osteoporosis    Rheumatoid arthritis (HCC)    Vertigo     Assessment/Plan:    Principal Problem:   Abscess of right hand Active Problems:   Normocytic anemia   Acute mid back pain   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HCC)   Sepsis (HCC)   HTN (hypertension)   COPD (chronic obstructive pulmonary disease) (HCC)   Legal blindness   Hyperkalemia   Hyponatremia   Nausea & vomiting   Chronic respiratory failure with hypoxia (HCC)   Compression fracture of T10 vertebra (HCC)   Acute on chronic diastolic CHF (congestive heart failure) (HCC)   Staphylococcus aureus sepsis (HCC)  Estimated body mass index is 23.83 kg/m as calculated from the following:   Height as of this encounter: 5' 2 (1.575 m).   Weight as of this encounter: 59.1 kg. Advance diet Up with therapy  Vitals reviewed, no recent fever. WBC 14.7, no recent fevers. ACE wrap intact. Culture positive for staph aureus.  Currently on Ancef . I  believe it is reasonable to transition to oral ABX when tolerated. Follow-up with [facility] orthopaedics for skin check later this week vs. Early next week.  DVT Prophylaxis - Lovenox  and TED hose NWB to the right hand.  DOROTHA Gustavo Level, PA-C [facility] Orthopaedic Surgery 02/23/2024, 7:52 AM

## 2024-02-23 NOTE — Plan of Care (Signed)

## 2024-02-23 NOTE — Progress Notes (Signed)
 PROGRESS NOTE    Ashley Mack   FMW:[id number] DOB: 1945/03/27  DOA: 02/15/2024 Date of Service: 02/23/24 which is hospital day 8  PCP: Lenon Layman ORN, MD    Hospital course / significant events:   78 y.o. 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  HPI: Patient attributes symptoms to possibly had an allergic reaction.  She states she has chronic back pain and has been taking gabapentin, recently came off gabapentin and started tramadol . Over the last week or so she has noticed increase in swelling in her lower legs and feet, swelling and redness and pain in right hand. She denies any injury.  No fever or chills. She also reports nausea and multiple episodes of vomiting.  Patient reports mild dry cough and SOB due to COPD which has not changed significantly.   12/02: ED physician did bedside ultrasound of her right hand, found to have right hand dorsal abscess, 10-12 cc of drainage is removed and sent out for culture. WBC 23.9 (baseline WBC 12-15), lactic acid 0.9, potassium 5.7, sodium 128, magnesium  2.5, phosphorus 3.0, GFR> 60, proBNP 885, liver function normal.  Temperature normal, blood pressure 197/91 --> 118/87, heart rate 110s, RR 20, oxygen saturation 87% on room air, which improved to 97% on 2 L home level oxygen.  Patient is admitted to telemetry bed as inpatient. 12/03.  WBC still elevated.  Hand looks a little less erythematous.  Continue antibiotics.  Patient states that she has been having some back pain and had a fall couple weeks ago.  MRI of the thoracic spine ordered.  12/04.  Reviewed MRI with patient showing a acute on chronic compression fracture of T10 and 35% vertebral height loss. Case discussed with interventional  radiology team and they will not operate on this currently while treating infection and high white count.  Physical therapy recommending rehab but patient would like to go home.  Starting IV Lasix  12/05.  Patient not tolerating oral oxycodone .  Will try oral tramadol .  Switch Lasix  over to oral.  Switch antibiotics over to IV Ancef . 12/06.  White blood cell count coming down to 15.7.  Orthopedic surgery did a another I&D right hand. 12/07.  Patient complains of pain in the right hand. 12/08.  White blood cell count down to 13.4.  This is probably her normal.  Continue IV antibiotics 12/09.  Cleared by orthopedic surgery to switch over to oral antibiotics upon discharge.  Patient complaining of feeling of food getting caught in her throat.  Patient complaining of severe back pain.  Physical therapist very concerned about her going home alone.  Patient declining rehab. 12/10: transitioning to po meds, pt reports nausea w/ meds, attributes it to the carvedilol  and doesn't want to take this, adjusting meds and if tolerating po anticipate dc tomorrow      Consultants:  Orthopedic surgery   Procedures/Surgeries: I&D R hand abscess       ASSESSMENT & PLAN:   Abscess of right hand Sepsis d/t hand abscess / cellulitis  Staph aureus on culture  Patient had I&D by ER physician.   Orthopedic surgery did another I&D on 12/6.   Of note, baseline WBC appears above normal  Orthopedic surgery gave clearance to change over to oral antibiotics (will treat for 7 days from 12/6).  Compression fracture of T10 vertebra  Pain control: Patient did not do well with oral oxycodone  or tramadol .  Continue Tylenol .  Diclofenac  gel. Pt declined any opiates  As needed nausea medications.   per interventional radiology team (hospitalist d/w them in the beginning of the hospital course) and they will not operate at this time secondary to treating infection and high white count.   Patient refused back brace.    Patient interested in going home and not out to rehab.     Acute on chronic diastolic CHF (congestive heart failure)  With elevation of proBNP and swelling of the lower extremities.    Ejection fraction 60 to 65%. Continue oral Lasix .   Dc Coreg  per patietn request  Patient resistant to any other medications for blood pressure or heart.   Sonogram lower extremities negative for DVT   COPD (chronic obstructive pulmonary disease) Continue rescue inhaler and Breo Ellipta    HTN (hypertension) Continue Coreg .     Nausea & vomiting Zofran    Hyponatremia - resolved Monitor BMP   Hyperkalemia - resolved Potassium normal range   Normocytic anemia Last hemoglobin 10.3   Legal blindness Pt states she is not worried about going home alone w/ this issue   Rheumatoid arthritis involving multiple sites On chronic prednisone    Chronic respiratory failure with hypoxia  Continue 2 L of oxygen      No concerns based on BMI: Body mass index is 23.83 kg/m.SABRA Significantly low or high BMI is associated with higher medical risk.  Underweight - under 18  overweight - 25 to 29 obese - 30 or more Class 1 obesity: BMI of 30.0 to 34 Class 2 obesity: BMI of 35.0 to 39 Class 3 obesity: BMI of 40.0 to 49 Super Morbid Obesity: BMI 50-59 Super-super Morbid Obesity: BMI 60+ Healthy nutrition and physical activity advised as adjunct to other disease management and risk reduction treatments    DVT prophylaxis: lovenox  IV fluids: no continuous IV fluids  Nutrition: regular diet  Central lines / other devices: none  Code Status: DNR ACP documentation reviewed:  none on file in [facility]  [facility] needs: home health  Medical barriers to dispo: po tolerance. Expected medical readiness for discharge tomorrow.              Subjective / Brief ROS:  Patient reports nausea this morning w/ her pills when they gave her po abx -- MAR reviewed she did not get po abx, spent some time going over  meds andpt attributes nausea to coreg  and does not want to continue this rx Denies CP/SOB.  Pain controlled.  Denies new weakness.  Tolerating diet.  Reports no concerns w/ urination/defecation.   Family Communication: none at this time pt states she will call her daughter     Objective Findings:  Vitals:   02/23/24 0438 02/23/24 0806 02/23/24 0838 02/23/24 1613  BP: (!) 154/66 (!) 166/89 (!) 153/66 138/82  Pulse: 91 91 94 (!) 102  Resp: 19 17  17   Temp: 97.6 F (36.4 C) 97.6 F (36.4 C)  (!) 97.4 F (36.3 C)  TempSrc:      SpO2: 100% 100% 98% 99%  Weight:      Height:        Intake/Output Summary (Last 24 hours) at 02/23/2024 1719 Last data filed at 02/23/2024 1508 Gross per 24 hour  Intake 240 ml  Output 1100 ml  Net -860 ml   Filed Weights   02/19/24 0500 02/20/24 0500 02/21/24 0506  Weight: 58.4 kg 59.1 kg 59.1 kg    Examination:  Physical Exam Constitutional:      General: She is not in acute distress. Cardiovascular:     Rate and Rhythm: Normal rate and regular rhythm.  Pulmonary:     Effort: Pulmonary effort is normal.     Breath sounds: Normal breath sounds.  Abdominal:     General: Bowel sounds are normal.     Palpations: Abdomen is soft.  Musculoskeletal:        General: Swelling (mild edema from IV infiltration LUE) present.  Skin:    General: Skin is warm and dry.  Neurological:     Mental Status: She is alert and oriented to person, place, and time. Mental status is at baseline.  Psychiatric:        Mood and Affect: Mood normal.        Behavior: Behavior normal.          Scheduled Medications:   calcitonin (salmon)  1 spray Alternating Nares Daily   cephALEXin   500 mg Oral Q8H   enoxaparin  (LOVENOX ) injection  40 mg Subcutaneous Q24H   Fe Fum-Vit C-Vit B12-FA  1 capsule Oral QPC breakfast   feeding supplement  237 mL Oral BID BM   fluticasone  furoate-vilanterol  1 puff Inhalation Daily   furosemide   40 mg Oral Daily    pantoprazole   40 mg Oral Daily   polyethylene glycol  17 g Oral Daily   predniSONE   5 mg Oral Daily    Continuous Infusions:    PRN Medications:  acetaminophen , albuterol , cyclobenzaprine , dextromethorphan -guaiFENesin , diclofenac  Sodium, hydrALAZINE , ondansetron  (ZOFRAN ) IV, ondansetron , sodium chloride , traZODone   Antimicrobials from admission:  Anti-infectives (From admission, onward)    Start     Dose/Rate Route Frequency Ordered Stop   02/23/24 1400  cephALEXin  (KEFLEX ) capsule 500 mg        500 mg Oral Every 8 hours 02/23/24 1259     02/18/24 2100  ceFAZolin  (ANCEF ) IVPB 2g/100 mL premix  Status:  Discontinued        2 g 200 mL/hr over 30 Minutes Intravenous Every 8 hours 02/18/24 1328 02/23/24 1254   02/16/24 2200  vancomycin  (VANCOREADY) IVPB 750 mg/150 mL  Status:  Discontinued        750 mg 150 mL/hr over 60 Minutes Intravenous Every 24 hours 02/15/24 2253 02/18/24 1326   02/16/24 2100  cefTRIAXone  (ROCEPHIN ) 2 g in sodium chloride  0.9 % 100 mL IVPB  Status:  Discontinued        2 g 200 mL/hr over 30 Minutes Intravenous Every 24 hours 02/15/24 2244 02/18/24 1326   02/15/24 2030  cefTRIAXone  (ROCEPHIN ) 2 g in sodium chloride  0.9 % 100 mL IVPB        2 g 200 mL/hr over 30 Minutes Intravenous Once 02/15/24 2021 02/15/24 2126   02/15/24 2030  vancomycin  (VANCOCIN ) IVPB 1000 mg/200 mL premix        1,000 mg 200 mL/hr over 60 Minutes Intravenous  Once 02/15/24 2021 02/15/24 2243           Data Reviewed:  I have personally reviewed the following...  CBC: Recent Labs  Lab 02/18/24 0554 02/19/24 0444 02/21/24 0905 02/22/24 0754 02/23/24 0538  WBC 19.4* 15.7* 13.4* 13.7* 14.7*  HGB 10.5* 10.6* 11.1* 10.3* 10.0*  HCT 32.6* 32.7* 35.0* 32.2* 31.5*  MCV 89.3 88.4 88.4 88.5 90.0  PLT 478* 445* 422* 387 360   Basic Metabolic Panel: Recent Labs  Lab 02/17/24  0518 02/18/24 0554 02/19/24 0444 02/21/24 0905 02/23/24 0707  NA 135 136 136 134* 136  K 4.6 3.8 3.7  5.0 4.2  CL 96* 93* 93* 89* 91*  CO2 31 33* 35* 38* 38*  GLUCOSE 165* 90 178* 97 152*  BUN 30* 23 36* 38* 39*  CREATININE 0.72 0.64 0.71 0.79 0.80  CALCIUM  8.6* 9.0 9.1 9.6 9.3   GFR: Estimated Creatinine Clearance: 44.4 mL/min (by C-G formula based on SCr of 0.8 mg/dL). Liver Function Tests: No results for input(s): AST, ALT, ALKPHOS, BILITOT, PROT, ALBUMIN in the last 168 hours. No results for input(s): LIPASE, AMYLASE in the last 168 hours. No results for input(s): AMMONIA in the last 168 hours. Coagulation Profile: No results for input(s): INR, PROTIME in the last 168 hours. Cardiac Enzymes: No results for input(s): CKTOTAL, CKMB, CKMBINDEX, TROPONINI in the last 168 hours. BNP (last 3 results) Recent Labs    02/15/24 1850 02/17/24 0518  PROBNP 885.0* 1,624.0*   HbA1C: No results for input(s): HGBA1C in the last 72 hours. CBG: No results for input(s): GLUCAP in the last 168 hours. Lipid Profile: No results for input(s): CHOL, HDL, LDLCALC, TRIG, CHOLHDL, LDLDIRECT in the last 72 hours. Thyroid  Function Tests: No results for input(s): TSH, T4TOTAL, FREET4, T3FREE, THYROIDAB in the last 72 hours. Anemia Panel: No results for input(s): VITAMINB12, FOLATE, FERRITIN, TIBC, IRON , RETICCTPCT in the last 72 hours. Most Recent Urinalysis On File:     Component Value Date/Time   COLORURINE STRAW (A) 08/06/2017 1121   APPEARANCEUR CLEAR (A) 08/06/2017 1121   LABSPEC 1.004 (L) 08/06/2017 1121   PHURINE 7.0 08/06/2017 1121   GLUCOSEU NEGATIVE 08/06/2017 1121   HGBUR SMALL (A) 08/06/2017 1121   BILIRUBINUR NEGATIVE 08/06/2017 1121   KETONESUR 20 (A) 08/06/2017 1121   PROTEINUR NEGATIVE 08/06/2017 1121   NITRITE NEGATIVE 08/06/2017 1121   LEUKOCYTESUR NEGATIVE 08/06/2017 1121   Sepsis Labs: @LABRCNTIP (procalcitonin:4,lacticidven:4) Microbiology: Recent Results (from the past 240 hours)  Blood Culture  (routine x 2)     Status: None   Collection Time: 02/15/24  8:49 PM   Specimen: BLOOD  Result Value Ref Range Status   Specimen Description BLOOD BLOOD RIGHT ARM  Final   Special Requests   Final    BOTTLES DRAWN AEROBIC AND ANAEROBIC Blood Culture adequate volume   Culture   Final    NO GROWTH 5 DAYS Performed at [facility], [address], [place], [place] [address]    Report Status 02/21/2024 FINAL  Final  Blood Culture (routine x 2)     Status: None   Collection Time: 02/15/24  8:49 PM   Specimen: BLOOD LEFT ARM  Result Value Ref Range Status   Specimen Description BLOOD LEFT ARM  Final   Special Requests   Final    BOTTLES DRAWN AEROBIC AND ANAEROBIC Blood Culture adequate volume   Culture   Final    NO GROWTH 5 DAYS Performed at [facility], [address], [place], [place] [address]    Report Status 02/20/2024 FINAL  Final  Aerobic/Anaerobic Culture w Gram Stain (surgical/deep wound)     Status: None   Collection Time: 02/15/24  8:49 PM   Specimen: Wound; Abscess  Result Value Ref Range Status   Specimen Description   Final    WOUND Performed at [facility], [address] [place], [place] [address]    Special Requests   Final    NONE Performed at [facility], [phone] [place], [place] [address]    Gram Stain   Final    FEW WBC PRESENT, PREDOMINANTLY PMN RARE GRAM POSITIVE COCCI    Culture   Final    FEW STAPHYLOCOCCUS AUREUS NO ANAEROBES ISOLATED Performed at [facility], [address] [place], [place] [address]    Report Status 02/20/2024 FINAL  Final   Organism ID, Bacteria STAPHYLOCOCCUS AUREUS  Final      Susceptibility   Staphylococcus aureus - MIC*    CIPROFLOXACIN <=0.5 SENSITIVE Sensitive     ERYTHROMYCIN <=0.25 SENSITIVE Sensitive     GENTAMICIN <=0.5 SENSITIVE Sensitive     OXACILLIN <=0.25 SENSITIVE Sensitive     TETRACYCLINE <=1 SENSITIVE Sensitive     VANCOMYCIN  <=0.5 SENSITIVE  Sensitive     TRIMETH/SULFA <=10 SENSITIVE Sensitive     CLINDAMYCIN <=0.25 SENSITIVE Sensitive     RIFAMPIN <=0.5 SENSITIVE Sensitive     Inducible Clindamycin NEGATIVE Sensitive     LINEZOLID 2 SENSITIVE Sensitive     * FEW STAPHYLOCOCCUS AUREUS  Resp panel by RT-PCR (RSV, Flu A&B, Covid) Anterior Nasal Swab     Status: None   Collection Time: 02/16/24 10:00 AM   Specimen: Anterior Nasal Swab  Result Value Ref Range Status   SARS Coronavirus 2 by RT PCR NEGATIVE NEGATIVE Final    Comment: (NOTE) SARS-CoV-2 target nucleic acids are NOT DETECTED.  The SARS-CoV-2 RNA is generally detectable in upper respiratory specimens during the acute phase of infection. The lowest concentration of SARS-CoV-2 viral copies this assay can detect is 138 copies/mL. A negative result does not preclude SARS-Cov-2 infection and should not be used as the sole basis for treatment or other patient management decisions. A negative result may occur with  improper specimen collection/handling, submission of specimen other than nasopharyngeal swab, presence of viral mutation(s) within the areas targeted by this assay, and inadequate number of viral copies(<138 copies/mL). A negative result must be combined with clinical observations, patient history, and epidemiological information. The expected result is Negative.  Fact Sheet for Patients:  bloggercourse.com  Fact Sheet for Healthcare Providers:  seriousbroker.it  This test is no t yet approved or cleared by the [place]  FDA and  has been authorized for detection and/or diagnosis of SARS-CoV-2 by FDA under an Emergency Use Authorization (EUA). This EUA will remain  in effect (meaning this test can be used) for the duration of the COVID-19 declaration under Section 564(b)(1) of the Act, 21 U.S.C.section 360bbb-3(b)(1), unless the authorization is terminated  or revoked sooner.       Influenza A by  PCR NEGATIVE NEGATIVE Final   Influenza B by PCR NEGATIVE NEGATIVE Final    Comment: (NOTE) The Xpert Xpress SARS-CoV-2/FLU/RSV plus assay is intended as an aid in the diagnosis of influenza from Nasopharyngeal swab specimens and should not be used as a sole basis for treatment. Nasal washings and aspirates are unacceptable for Xpert Xpress SARS-CoV-2/FLU/RSV testing.  Fact Sheet for Patients: bloggercourse.com  Fact Sheet for Healthcare Providers: seriousbroker.it  This test is not yet approved or cleared by the [place]  FDA and has been authorized for detection and/or diagnosis of SARS-CoV-2 by FDA under an Emergency Use Authorization (EUA). This EUA will remain in effect (meaning this test can be used) for the duration of the COVID-19 declaration under Section 564(b)(1) of the Act, 21 U.S.C. section 360bbb-3(b)(1), unless the authorization is terminated or revoked.     Resp Syncytial Virus by PCR NEGATIVE NEGATIVE Final  Comment: (NOTE) Fact Sheet for Patients: bloggercourse.com  Fact Sheet for Healthcare Providers: seriousbroker.it  This test is not yet approved or cleared by the [place]  FDA and has been authorized for detection and/or diagnosis of SARS-CoV-2 by FDA under an Emergency Use Authorization (EUA). This EUA will remain in effect (meaning this test can be used) for the duration of the COVID-19 declaration under Section 564(b)(1) of the Act, 21 U.S.C. section 360bbb-3(b)(1), unless the authorization is terminated or revoked.  Performed at [facility], [address], [place], [place] [address]       Radiology Studies last 3 days: US  Venous Img Lower Bilateral (DVT) Result Date: 02/21/2024 CLINICAL DATA:  Bilateral lower extremity edema EXAM: BILATERAL LOWER EXTREMITY VENOUS DOPPLER ULTRASOUND TECHNIQUE: Gray-scale sonography with graded  compression, as well as color Doppler and duplex ultrasound were performed to evaluate the lower extremity deep venous systems from the level of the common femoral vein and including the common femoral, femoral, profunda femoral, popliteal and calf veins including the posterior tibial, peroneal and gastrocnemius veins when visible. The superficial great saphenous vein was also interrogated. Spectral Doppler was utilized to evaluate flow at rest and with distal augmentation maneuvers in the common femoral, femoral and popliteal veins. COMPARISON:  None Available. FINDINGS: RIGHT LOWER EXTREMITY Common Femoral Vein: No evidence of thrombus. Normal compressibility, respiratory phasicity and response to augmentation. Saphenofemoral Junction: No evidence of thrombus. Normal compressibility and flow on color Doppler imaging. Profunda Femoral Vein: No evidence of thrombus. Normal compressibility and flow on color Doppler imaging. Femoral Vein: No evidence of thrombus. Normal compressibility, respiratory phasicity and response to augmentation. Popliteal Vein: No evidence of thrombus. Normal compressibility, respiratory phasicity and response to augmentation. Calf Veins: No evidence of thrombus. Normal compressibility and flow on color Doppler imaging. Superficial Great Saphenous Vein: No evidence of thrombus. Normal compressibility. Venous Reflux:  None. Other Findings:  None. LEFT LOWER EXTREMITY Common Femoral Vein: No evidence of thrombus. Normal compressibility, respiratory phasicity and response to augmentation. Saphenofemoral Junction: No evidence of thrombus. Normal compressibility and flow on color Doppler imaging. Profunda Femoral Vein: No evidence of thrombus. Normal compressibility and flow on color Doppler imaging. Femoral Vein: No evidence of thrombus. Normal compressibility, respiratory phasicity and response to augmentation. Popliteal Vein: No evidence of thrombus. Normal compressibility, respiratory  phasicity and response to augmentation. Calf Veins: No evidence of thrombus. Normal compressibility and flow on color Doppler imaging. Superficial Great Saphenous Vein: No evidence of thrombus. Normal compressibility. Venous Reflux:  None. Other Findings:  None. IMPRESSION: No evidence of deep venous thrombosis in either lower extremity. Electronically Signed   By: Wilkie Lent M.D.   On: 02/21/2024 13:07         Laneta Blunt, DO [facility] 02/23/2024, 5:19 PM    Dictation software may have been used to generate the above note. Typos may occur and escape review in typed/dictated notes. Please contact Dr Blunt directly for clarity if needed.  Staff may message me via secure chat in Epic  but this may not receive an immediate response,  please page me for urgent matters!  If 7PM-7AM, please contact night coverage [email or web address]

## 2024-02-23 NOTE — TOC Progression Note (Signed)
 Transition of Care [facility]) - Progression Note    Patient Details  Name: NISHITA ISAACKS MRN: [id number] Date of Birth: 1945-08-25  [facility] CM/SW Contact  Ventura Leggitt  [place], [place] Phone Number: 02/23/2024, 11:26 AM  Clinical Narrative:   LCSWA spoke to patient at the bedside about PT and OT recommendations about SNF. LCSWA educated the difference between SNF vs HH and the patient was adamant in going home. LCSWA set patient up with max HH services through [facility] at DC.   TOC to follow for discharge                      Expected Discharge Plan and Services                                               Social Drivers of Health (SDOH) Interventions SDOH Screenings   Food Insecurity: No Food Insecurity (02/16/2024)  Housing: Low Risk  (02/16/2024)  Transportation Needs: No Transportation Needs (02/16/2024)  Utilities: Not At Risk (02/16/2024)  Depression (PHQ2-9): Low Risk  (10/26/2019)  Financial Resource Strain: Low Risk  (11/18/2023)   Received from [facility]  Social Connections: Moderately Isolated (02/16/2024)  Tobacco Use: Medium Risk (02/15/2024)    Readmission Risk Interventions     No data to display

## 2024-02-23 NOTE — Progress Notes (Addendum)
 OT Cancellation Note  Patient Details Name: SHANECIA HOGANSON MRN: [id number] DOB: 1945/06/22   Cancelled Treatment:    Reason Eval/Treat Not Completed: Other (comment). Pt received in bed with RN in room. Pt refuses participation in OT session due to nausea and dizziness. OT will continue to follow and see as able.   Addendum: Checked on pt again later this date, pt still c/o nausea and refused therapy at this time. Of note, OT continues to recommend STR based on chart review.   Ayaansh Smail L. Tyreck Bell, OTR/L  02/23/24, 10:12 AM

## 2024-02-24 ENCOUNTER — Other Ambulatory Visit: Payer: Self-pay

## 2024-02-24 ENCOUNTER — Encounter: Payer: Self-pay | Admitting: Hematology and Oncology

## 2024-02-24 LAB — CBC
HCT: 32.3 % — ABNORMAL LOW (ref 36.0–46.0)
Hemoglobin: 10.2 g/dL — ABNORMAL LOW (ref 12.0–15.0)
MCH: 28.9 pg (ref 26.0–34.0)
MCHC: 31.6 g/dL (ref 30.0–36.0)
MCV: 91.5 fL (ref 80.0–100.0)
Platelets: 317 K/uL (ref 150–400)
RBC: 3.53 MIL/uL — ABNORMAL LOW (ref 3.87–5.11)
RDW: 15.7 % — ABNORMAL HIGH (ref 11.5–15.5)
WBC: 12.7 K/uL — ABNORMAL HIGH (ref 4.0–10.5)
nRBC: 0 % (ref 0.0–0.2)

## 2024-02-24 LAB — BASIC METABOLIC PANEL WITH GFR
Anion gap: 8 (ref 5–15)
BUN: 45 mg/dL — ABNORMAL HIGH (ref 8–23)
CO2: 36 mmol/L — ABNORMAL HIGH (ref 22–32)
Calcium: 9.5 mg/dL (ref 8.9–10.3)
Chloride: 90 mmol/L — ABNORMAL LOW (ref 98–111)
Creatinine, Ser: 0.81 mg/dL (ref 0.44–1.00)
GFR, Estimated: >60 mL/min (ref >60)
Glucose, Bld: 90 mg/dL (ref 70–99)
Potassium: 4.3 mmol/L (ref 3.5–5.1)
Sodium: 134 mmol/L — ABNORMAL LOW (ref 135–145)

## 2024-02-24 MED ORDER — ONDANSETRON 4 MG PO TBDP
4.0000 mg | ORAL_TABLET | Freq: Four times a day (QID) | ORAL | 0 refills | Status: AC | PRN
  Filled 2024-02-24: qty 60, 15d supply, fill #0

## 2024-02-24 MED ORDER — CYANOCOBALAMIN 1000 MCG PO TABS: 1000.0000 ug | ORAL_TABLET | Freq: Every day | ORAL | Status: AC

## 2024-02-24 MED ORDER — ACETAMINOPHEN 325 MG PO TABS: 650.0000 mg | ORAL_TABLET | Freq: Four times a day (QID) | ORAL | Status: AC | PRN

## 2024-02-24 MED ORDER — CYCLOBENZAPRINE HCL 5 MG PO TABS
2.5000 mg | ORAL_TABLET | Freq: Three times a day (TID) | ORAL | 0 refills | Status: AC | PRN
  Filled 2024-02-24: qty 30, 10d supply, fill #0

## 2024-02-24 MED ORDER — CEPHALEXIN 500 MG PO CAPS
500.0000 mg | ORAL_CAPSULE | Freq: Three times a day (TID) | ORAL | 0 refills | Status: AC
  Filled 2024-02-24: qty 15, 5d supply, fill #0

## 2024-02-24 MED FILL — Furosemide Tab 40 MG: 40.0000 mg | ORAL | 30 days supply | Qty: 30 | Fill #0 | Status: AC

## 2024-02-24 NOTE — TOC Transition Note (Signed)
 Transition of Care [facility]) - Discharge Note   Patient Details  Name: Ashley Mack MRN: [id number] Date of Birth: Sep 07, 1945  [facility]) CM/SW Contact:  Alvaro Louder, LCSW Phone Number: 02/24/2024, 2:39 PM   Clinical Narrative:   LCSWA confirmed with MD that patient is stable for discharge. LCSWA notified the patient and they are in agreement with discharge. LCSWA discussed PT recommendation of HH Patient was agreeable. LCSWA reached out to [facility] admissions coordinator an started service for patient. The patient reported that he would have her daughter come pick her up at discharge.   TOC signing off       Patient Goals and CMS Choice            Discharge Placement                       Discharge Plan and Services Additional resources added to the After Visit Summary for                                       Social Drivers of Health (SDOH) Interventions SDOH Screenings   Food Insecurity: No Food Insecurity (02/16/2024)  Housing: Low Risk (02/16/2024)  Transportation Needs: No Transportation Needs (02/16/2024)  Utilities: Not At Risk (02/16/2024)  Financial Resource Strain: Low Risk  (11/18/2023)   Received from [facility]  Social Connections: Moderately Isolated (02/16/2024)  Tobacco Use: Medium Risk (02/15/2024)     Readmission Risk Interventions     No data to display

## 2024-02-24 NOTE — Progress Notes (Signed)
 Pt up in chair upon arrival c/o back pain. Discussed potential d/c home with pt and pt's daughter. Pt refusing STR, daughter feels comfortable with pt returning home since her activity level is very sedentary. Pt demonstrated sit<>stand transfers with CG/S. Short distance gait in room with Right Wrist lightly resting on walker grip. No LOB, VSS. Pt returned to chair, MD in to discuss POC.    02/24/24 1316  PT Visit Information  Assistance Needed +1  History of Present Illness Ashley Mack is a 78 y.o. female with medical history significant of blindness, HTN, COPD on 2L O2, dCHF, GERD, iron  deficiency anemia, anxiety, chronic back pain, migraine, rheumatoid arthritis with finger deformity on prednisone  chronically, chronic leukocytosis, who presents with right hand swelling, redness and pain.Patient has T10 compression fx, R hand NWB.  Subjective Data  Subjective  (I want to go home)  Patient Stated Goal to improve pain  Precautions  Precautions Back;Fall  Recall of Precautions/Restrictions Impaired  Precaution/Restrictions Comments Repeated cues for R hand NWB & spinal precautions  Restrictions  Weight Bearing Restrictions Per Provider Order Yes  RUE Weight Bearing Per Provider Order NWB  Other Position/Activity Restrictions NWB to right hand per ortho note  Pain Assessment  Pain Assessment Faces  Faces Pain Scale 2  Pain Location LBP  Pain Descriptors / Indicators Aching;Discomfort  Pain Intervention(s) Limited activity within patient's tolerance  Cognition  Arousal Alert  Behavior During Therapy [facility] for tasks assessed/performed  Following Commands  Following commands Intact  Cueing  Cueing Techniques Verbal cues  Communication  Communication Impaired  Factors Affecting Communication Hearing impaired  Bed Mobility  General bed mobility comments  (NT in chair pre/post session)  Transfers  Overall transfer level Needs assistance  Equipment used 1 person hand held  assist;Rolling walker (2 wheels)  Transfers Sit to/from Stand  Sit to Stand Contact guard assist  General transfer comment verbal cues for hand placement and NWB adherence to R hand. Pt prefers to rest Right wrist lightly on RW once standing  Ambulation/Gait  Ambulation/Gait assistance Contact guard assist;Min assist  Gait Distance (Feet) 20 Feet  Assistive device Rolling walker (2 wheels)  Gait Pattern/deviations Step-through pattern  General Gait Details  (Pt preferred to rest Right wrist on RW for light balance, no true wt bearing.)  Gait velocity dec  Balance  Overall balance assessment Needs assistance  Sitting-balance support Feet supported  Sitting balance-Leahy Scale Good  Standing balance support Bilateral upper extremity supported;During functional activity  Standing balance-Leahy Scale Fair  Standing balance comment high falls risk  General Comments  General comments (skin integrity, edema, etc.) Right wrist wrapped. Daughter present during session, all questions and concerns addressed.  PT - End of Session  Equipment Utilized During Treatment Oxygen  Activity Tolerance Patient tolerated treatment well;Patient limited by pain  Patient left in chair;with call bell/phone within reach;in bed;with bed alarm set  Nurse Communication Mobility status   PT - Assessment/Plan  PT Visit Diagnosis Other abnormalities of gait and mobility (R26.89);Pain  Pain - Right/Left Right  Pain - part of body Hand  PT Frequency (ACUTE ONLY) Min 2X/week  Follow Up Recommendations Skilled nursing-short term rehab (<3 hours/day)  Can patient physically be transported by private vehicle No  Patient can return home with the following A little help with walking and/or transfers;A little help with bathing/dressing/bathroom;Assist for transportation;Help with stairs or ramp for entrance;Assistance with cooking/housework  PT equipment Other (comment) (Pt states no DME needs)  AM-PAC PT 6 Clicks  Mobility Outcome Measure (Version 2)  Help needed turning from your back to your side while in a flat bed without using bedrails? 3  Help needed moving from lying on your back to sitting on the side of a flat bed without using bedrails? 3  Help needed moving to and from a bed to a chair (including a wheelchair)? 3  Help needed standing up from a chair using your arms (e.g., wheelchair or bedside chair)? 3  Help needed to walk in hospital room? 3  Help needed climbing 3-5 steps with a railing?  2  6 Click Score 17  Consider Recommendation of Discharge To: Home with [facility]  Progressive Mobility  What is the highest level of mobility based on the mobility assessment? Level 4 (Ambulates with assistance) - Balance while stepping forward/back - Complete  Activity Ambulated with assistance  PT Goal Progression  Progress towards PT goals Progressing toward goals  PT Time Calculation  PT Start Time (ACUTE ONLY) 1148  PT Stop Time (ACUTE ONLY) 1206  PT Time Calculation (min) (ACUTE ONLY) 18 min  PT General Charges  $$ ACUTE PT VISIT 1 Visit  PT Treatments  $Therapeutic Activity 8-22 mins  Darice Bohr, PTA

## 2024-02-24 NOTE — Progress Notes (Signed)
 Occupational Therapy Treatment Patient Details Name: Ashley Mack MRN: [id number] DOB: [birth year] Today's Date: 02/24/2024   History of present illness Ashley Mack is a 78 y.o. female with medical history significant of blindness, HTN, COPD on 2L O2, dCHF, GERD, iron  deficiency anemia, anxiety, chronic back pain, migraine, rheumatoid arthritis with finger deformity on prednisone  chronically, chronic leukocytosis, who presents with right hand swelling, redness and pain.Patient has T10 compression fx, R hand NWB.   OT comments  Pt received in bed, agreeable to session. Pt remains adamant about returning home alone without assistance, despite requiring physical assist for all tasks including MIN A to perform STS with +1 HHA, operating phone (pt cannot see to locate numbers), setup of meal tray and opening containers. OT reviews spinal precautions including logroll technique for bed mobility, LB ADL compensatory strategies using seated figure four, NWB to R hand as specified in ortho note, and safe ADL completion. Pt able to walk with MIN A +1 HHA (often requesting +2 HHA on R elbow 2/2 visual impairments) ~5 ft in room but does not tolerate more due to fatigue / SOB with O2 WNL on 2L via [place].   Pt unlikely able to call for emergency services in the event of a fall as she cannot operate her personal phone without assistance (pt dismissive of alternate solutions e.g. life alert button or use of smart devices), has a difficult time managing utensils/containers for self-feeding, and is very unsteady with mobility due to deficits in strength, balance and activity tolerance. Highly recommend pt consider STR vs home alone. Pt insistent that she can manage as she knows where everything in my house is. Pt is a high falls risk, and will need +1 assist as she is physically unable to get OOB or perform transfers independently at this time. Encouraged pt to setup plan for increased care with family. Patient will  benefit from continued inpatient follow up therapy, <3 hours/day.       If plan is discharge home, recommend the following:  Assist for transportation;Assistance with cooking/housework;Help with stairs or ramp for entrance;A lot of help with bathing/dressing/bathroom;A lot of help with walking and/or transfers   Equipment Recommendations  BSC/3in1;Wheelchair (measurements OT);Wheelchair cushion (measurements OT)       Precautions / Restrictions Precautions Precautions: Back;Fall Recall of Precautions/Restrictions: Impaired Precaution/Restrictions Comments: pt unaware of R hand NWB or spinal precautions Restrictions Weight Bearing Restrictions Per Provider Order: Yes RUE Weight Bearing Per Provider Order: Non weight bearing Other Position/Activity Restrictions: NWB to right hand per ortho note       Mobility Bed Mobility Overal bed mobility: Needs Assistance Bed Mobility: Sidelying to Sit Rolling: Contact guard assist, Used rails Sidelying to sit: Min assist, HOB elevated, Used rails       General bed mobility comments: discussed log roll technique for bed mobility. sleeps in lift chair at home.    Transfers Overall transfer level: Needs assistance Equipment used: 1 person hand held assist Transfers: Sit to/from Stand, Bed to chair/wheelchair/BSC Sit to Stand: Min assist     Step pivot transfers: Min assist     General transfer comment: verbal cues for hand placement and NWB adherence to R hand. Pt requests +2 HHA (assist provided on R elbow) for reassurance.     Balance Overall balance assessment: Needs assistance Sitting-balance support: Feet supported Sitting balance-Leahy Scale: Good     Standing balance support: Bilateral upper extremity supported Standing balance-Leahy Scale: Fair Standing balance comment: high falls risk  ADL either performed or assessed with clinical judgement   ADL Overall ADL's : Needs  assistance/impaired Eating/Feeding: Minimal assistance;Sitting Eating/Feeding Details (indicate cue type and reason): MIN A 2/2 visual impairments, pt reliant on verbal cues to locate food on plate / tray setup, requires assist to locate straw, open wrapper and place in milk container. Grooming: Sitting;Wash/dry hands;Minimal assistance Grooming Details (indicate cue type and reason): requires assist to open package of hand wipes, unable due to [facility] deficits in B hands                 Toilet Transfer: Minimal assistance;BSC/3in1;Cueing for safety;Cueing for sequencing Toilet Transfer Details (indicate cue type and reason): pt continues to require MIN A with HHA to stand from recliner and bed. Pt requires verbal cues for encourgement, vcs to remind her not use R hand as she attempts to push up from all surfaces. Pt generally unsteady upon standing, reliant on tactile/verbal cues for unfamiliar environment + physical assist         Functional mobility during ADLs: Minimal assistance;Moderate assistance;Cueing for safety;Cueing for sequencing General ADL Comments: OT educates on spinal precautions for ADL performance. Pt plans on taking standing showers (does not have appropriate plan for R hand bandage/wound), insists she can manage at home alone. With +1 HHA and MIN A, pt walks ~5 ft in room. Unable to tolerate more.    Extremity/Trunk Assessment Upper Extremity Assessment Upper Extremity Assessment: Right hand dominant (RUE in fresh bandage, NWB per ortho note)            Vision   Additional Comments: Pt sees shadows only         Communication Communication Communication: No apparent difficulties Factors Affecting Communication: Hearing impaired   Cognition Arousal: Alert Behavior During Therapy: WFL for tasks assessed/performed Cognition: No apparent impairments             OT - Cognition Comments: Pt lacks appropriate judgement and insight into situation. Insisting  on returning home despite no help availalbe. Pt reliant on OT for all tasks due to visual impairments and impaired RUE use (turning on fan, opening milk container, operating phone)                 Following commands: Intact        Cueing   Cueing Techniques: Verbal cues  Exercises Other Exercises Other Exercises: Problem-solving and edu to pt re: NWB R hand and spinal precautions. Discussed back precautions with implications for ADLs, pt able to return demo seated figure four. Pt continues to insist she can manage ADL/IADL at home alone with no support from family, continues to decline STR. OT recs remain STR.            Pertinent Vitals/ Pain       Pain Assessment Pain Assessment: Faces Faces Pain Scale: Hurts a little bit Pain Location: LBP Pain Descriptors / Indicators: Aching, Grimacing, Guarding, Sharp Pain Intervention(s): Limited activity within patient's tolerance, Monitored during session   Frequency  Min 2X/week        Progress Toward Goals  OT Goals(current goals can now be found in the care plan section)  Progress towards OT goals: Progressing toward goals  Acute Rehab OT Goals OT Goal Formulation: With patient Time For Goal Achievement: 03/02/24 Potential to Achieve Goals: Good ADL Goals Pt Will Perform Lower Body Dressing: sitting/lateral leans;sit to/from stand;with adaptive equipment;with mod assist;with min assist Pt Will Transfer to Toilet: with max assist;stand pivot transfer;bedside commode Pt Will  Perform Toileting - Clothing Manipulation and hygiene: with caregiver independent in assisting;sitting/lateral leans;sit to/from stand Additional ADL Goal #1: Pt will complete bed mobility in anticipation of EOB/OOB ADL tasks with caregiver indep in assisting, 4/4 opportunities.  Plan         AM-PAC OT 6 Clicks Daily Activity     Outcome Measure   Help from another person eating meals?: None Help from another person taking care of personal  grooming?: A Little Help from another person toileting, which includes using toliet, bedpan, or urinal?: A Lot Help from another person bathing (including washing, rinsing, drying)?: A Lot Help from another person to put on and taking off regular upper body clothing?: A Little Help from another person to put on and taking off regular lower body clothing?: A Lot 6 Click Score: 16    End of Session Equipment Utilized During Treatment: Oxygen  OT Visit Diagnosis: Other abnormalities of gait and mobility (R26.89);Muscle weakness (generalized) (M62.81);History of falling (Z91.81);Pain Pain - Right/Left: Right Pain - part of body: Hand (and back)   Activity Tolerance Patient tolerated treatment well   Patient Left in chair;with call bell/phone within reach;with chair alarm set   Nurse Communication Mobility status        Time: 9153-9088 OT Time Calculation (min): 25 min  Charges: OT General Charges $OT Visit: 1 Visit OT Treatments $Self Care/Home Management : 23-37 mins  Korrie Hofbauer L. Finas Delone, OTR/L  02/24/2024, 9:25 AM

## 2024-02-24 NOTE — Discharge Instructions (Signed)
 Diet: As you were doing prior to hospitalization   Shower:  Keep hand dry until seen in the office next week.  Dressing:  Leave dressing on until seen in the office.  Activity:  Increase activity slowly as tolerated, but follow the weight bearing instructions below.  No lifting or driving for 6 weeks.  Weight Bearing:   Weight bearing as tolerated to the right hand.  To prevent constipation: you may use a stool softener such as -  Colace (over the counter) 100 mg by mouth twice a day  Drink plenty of fluids (prune juice may be helpful) and high fiber foods Miralax  (over the counter) for constipation as needed.    Itching:  If you experience itching with your medications, try taking only a single pain pill, or even half a pain pill at a time.  You may take up to 10 pain pills per day, and you can also use benadryl over the counter for itching or also to help with sleep.   Precautions:  If you experience chest pain or shortness of breath - call 911 immediately for transfer to the hospital emergency department!!  If you develop a fever greater that 101 F, purulent drainage from wound, increased redness or drainage from wound, or calf pain-Call [facility]                                              Follow- Up Appointment:  Please call for an appointment to be seen in 2 weeks at [facility]

## 2024-02-24 NOTE — Plan of Care (Signed)

## 2024-02-24 NOTE — Progress Notes (Signed)
 Subjective:   Right dorsal hand abscess, S/P bedside I&D on 02/19/24 Patient reports pain as mild this AM, reports hand pain is much improved. Patient is well, and has had no acute complaints or problems Plan is to go Home after hospital stay.   Negative for chest pain and shortness of breath Fever: No recent fevers. Gastrointestinal:Negative for nausea and vomiting  Objective: Vital signs in last 24 hours: Temp:  [97.4 F (36.3 C)-98.4 F (36.9 C)] 98.4 F (36.9 C) (12/11 0444) Pulse Rate:  [91-102] 100 (12/11 0444) Resp:  [16-17] 16 (12/11 0444) BP: (136-166)/(66-89) 144/74 (12/11 0444) SpO2:  [96 %-100 %] 99 % (12/11 0444) Weight:  [58 kg] 58 kg (12/11 0444)  Intake/Output from previous day:  Intake/Output Summary (Last 24 hours) at 02/24/2024 0702 Last data filed at 02/24/2024 0444 Gross per 24 hour  Intake 480 ml  Output 750 ml  Net -270 ml    Intake/Output this shift: No intake/output data recorded.  Labs: Recent Labs    02/21/24 0905 02/22/24 0754 02/23/24 0538 02/24/24 0543  HGB 11.1* 10.3* 10.0* 10.2*   Recent Labs    02/23/24 0538 02/24/24 0543  WBC 14.7* 12.7*  RBC 3.50* 3.53*  HCT 31.5* 32.3*  PLT 360 317   Recent Labs    02/23/24 0707 02/24/24 0543  NA 136 134*  K 4.2 4.3  CL 91* 90*  CO2 38* 36*  BUN 39* 45*  CREATININE 0.80 0.81  GLUCOSE 152* 90  CALCIUM  9.3 9.5   No results for input(s): LABPT, INR in the last 72 hours.   EXAM General - Patient is Alert, Appropriate, and Oriented Extremity - Dressing intact to the right hand. Swelling continues to improve in the hand.  No purulent drainage noted. Good granulation tissues noted over the previous I&D site to the right hand. Intact to light touch to the hand.   Limited motion due to h/o RA and ulnar nerve injury. New dressing applied to the right hand.  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    Iron  (Fe) deficiency anemia    Migraine headache    in past   Migraines    Motion sickness    No natural teeth    Osteoporosis    Rheumatoid arthritis (HCC)    Vertigo     Assessment/Plan:    Principal Problem:   Abscess of right hand Active Problems:   Normocytic anemia   Acute mid back pain   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HCC)   Sepsis (HCC)   HTN (hypertension)   COPD (chronic obstructive pulmonary disease) (HCC)   Legal blindness   Hyperkalemia   Hyponatremia   Nausea & vomiting   Chronic respiratory failure with hypoxia (HCC)   Compression fracture of T10 vertebra (HCC)   Acute on chronic diastolic CHF (congestive heart failure) (HCC)   Staphylococcus aureus sepsis (HCC)  Estimated body mass index is 23.39 kg/m as calculated from the following:   Height as of this encounter: 5' 2 (1.575 m).   Weight as of this encounter: 58 kg.  Advance diet Up with therapy  Vitals reviewed, no recent fever. WBC 12.7, no recent fevers. New dressing applied.  Swelling improving, no purulent drainage. Culture positive for staph aureus.  Currently on Keflex  Follow-up with [facility] orthopaedics for skin check next week.  DVT Prophylaxis - Lovenox  and TED hose NWB to the right hand.  DOROTHA Gustavo Level, PA-C [facility] 02/24/2024, 7:02 AM

## 2024-02-25 NOTE — Discharge Summary (Signed)
 Physician Discharge Summary   Patient: Ashley Mack MRN: [id number]  DOB: [birth year]   Admit:     Date of Admission: 02/15/2024 Admitted from: home   Discharge: Date of discharge: 02/24/2024 Disposition: Home health Condition at discharge: fair  CODE STATUS: DNR    Discharge Physician: Laneta Blunt, DO [facility]     PCP: Ashley Mack ORN, MD  Recommendations for Outpatient Follow-up:  Follow up with PCP Ashley Mack ORN, MD in 1-2 weeks   Discharge Instructions     (HEART FAILURE PATIENTS) Call MD:  Anytime you have any of the following symptoms: 1) 3 pound weight gain in 24 hours or 5 pounds in 1 week 2) shortness of breath, with or without a dry hacking cough 3) swelling in the hands, feet or stomach 4) if you have to sleep on extra pillows at night in order to breathe.   Complete by: As directed    Ambulatory referral to Neurosurgery   Complete by: As directed    Please Select To Department: [facility] for Nerve or Spine  Please select To Department: [facility] AT [place] for Cranial or Neurovascular   Increase activity slowly   Complete by: As directed          Discharge Diagnoses: Principal Problem:   Abscess of right hand Active Problems:   Sepsis (HCC)   Acute mid back pain   Compression fracture of T10 vertebra (HCC)   HTN (hypertension)   COPD (chronic obstructive pulmonary disease) (HCC)   Acute on chronic diastolic CHF (congestive heart failure) (HCC)   Nausea & vomiting   Hyperkalemia   Hyponatremia   Normocytic anemia   Legal blindness   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HCC)   Chronic respiratory failure with hypoxia (HCC)   Staphylococcus aureus sepsis [facility])      Hospital course / significant events:   78 y.o. female with medical history significant of blindness, HTN, COPD on 2L O2, dCHF, GERD, iron  deficiency anemia, anxiety, chronic back pain, migraine, rheumatoid  arthritis with finger deformity on prednisone  chronically, chronic leukocytosis, who presents with right hand swelling, redness and pain.  HPI: Patient attributes symptoms to possibly had an allergic reaction.  She states she has chronic back pain and has been taking gabapentin, recently came off gabapentin and started tramadol . Over the last week or so she has noticed increase in swelling in her lower legs and feet, swelling and redness and pain in right hand. She denies any injury.  No fever or chills. She also reports nausea and multiple episodes of vomiting.  Patient reports mild dry cough and SOB due to COPD which has not changed significantly.   12/02: ED physician did bedside ultrasound of her right hand, found to have right hand dorsal abscess, 10-12 cc of drainage is removed and sent out for culture. WBC 23.9 (baseline WBC 12-15), lactic acid 0.9, potassium 5.7, sodium 128, magnesium  2.5, phosphorus 3.0, GFR> 60, proBNP 885, liver function normal.  Temperature normal, blood pressure 197/91 --> 118/87, heart rate 110s, RR 20, oxygen saturation 87% on room air, which improved to 97% on 2 L home level oxygen.  Patient is admitted to telemetry bed as inpatient. 12/03.  WBC still elevated.  Hand looks a little less erythematous.  Continue antibiotics.  Patient states that she has been having some back pain and had a fall couple weeks ago.  MRI of the thoracic spine ordered.  12/04.  Reviewed MRI with patient showing  a acute on chronic compression fracture of T10 and 35% vertebral height loss. Case discussed with interventional radiology team and they will not operate on this currently while treating infection and high white count.  Physical therapy recommending rehab but patient would like to go home.  Starting IV Lasix  12/05.  Patient not tolerating oral oxycodone .  Will try oral tramadol .  Switch Lasix  over to oral.  Switch antibiotics over to IV Ancef . 12/06.  White blood cell count coming down to  15.7.  Orthopedic surgery did a another I&D right hand. 12/07.  Patient complains of pain in the right hand. 12/08.  White blood cell count down to 13.4.  This is probably her normal.  Continue IV antibiotics 12/09.  Cleared by orthopedic surgery to switch over to oral antibiotics upon discharge.  Patient complaining of feeling of food getting caught in her throat.  Patient complaining of severe back pain.  Physical therapist very concerned about her going home alone.  Patient declining rehab. 12/10: transitioning to po meds, pt reports nausea w/ meds, attributes it to the carvedilol  and doesn't want to take this, adjusting meds and if tolerating po anticipate dc tomorrow  12/11: HH/DME ordered, ortho plan to see next week for skin check, they changed dressing and examined hand this morning no concerns.      Consultants:  Orthopedic surgery   Procedures/Surgeries: I&D R hand abscess       ASSESSMENT & PLAN:   Abscess of right hand Sepsis d/t hand abscess / cellulitis  Staph aureus on culture  Patient had I&D by ER physician.   Orthopedic surgery did another I&D on 12/6.   Of note, baseline WBC appears above normal  Orthopedic surgery gave clearance to change over to oral antibiotics (will treat for 7 days from 12/6).     Compression fracture of T10 vertebra  Pain control: Patient did not do well with oral oxycodone  or tramadol .  Continue Tylenol .  Diclofenac  gel. Pt declined any opiates  As needed nausea medications.   per interventional radiology team (hospitalist d/w them in the beginning of the hospital course) and they will not operate at this time secondary to treating infection and high white count.   Patient refused back brace.   Patient interested in going home and not out to rehab.     Acute on chronic diastolic CHF (congestive heart failure)  With elevation of proBNP and swelling of the lower extremities.    Ejection fraction 60 to 65%. Continue oral Lasix .   Dc  Coreg  per patietn request  Patient resistant to any other medications for blood pressure or heart.   Sonogram lower extremities negative for DVT   COPD (chronic obstructive pulmonary disease) Continue rescue inhaler and Breo Ellipta    HTN (hypertension) Continue Coreg .     Nausea & vomiting Zofran    Hyponatremia - resolved Monitor BMP   Hyperkalemia - resolved Potassium normal range   Normocytic anemia Last hemoglobin 10.3   Legal blindness Pt states she is not worried about going home alone w/ this issue   Rheumatoid arthritis involving multiple sites On chronic prednisone    Chronic respiratory failure with hypoxia  Continue 2 L of oxygen      No concerns based on BMI: Body mass index is 23.83 kg/m.SABRA Significantly low or high BMI is associated with higher medical risk.  Underweight - under 18  overweight - 25 to 29 obese - 30 or more Class 1 obesity: BMI of 30.0 to 34 Class 2  obesity: BMI of 35.0 to 39 Class 3 obesity: BMI of 40.0 to 49 Super Morbid Obesity: BMI 50-59 Super-super Morbid Obesity: BMI 60+ Healthy nutrition and physical activity advised as adjunct to other disease management and risk reduction treatments    DVT prophylaxis: lovenox            Discharge Instructions  Allergies as of 02/24/2024       Reactions   Methotrexate And Trimetrexate Nausea And Vomiting, Other (See Comments)   Flu like symptoms   Other Nausea Only, Other (See Comments)   Arthritis medications . Unsure of what the med was.   Gabapentin    Prochlorperazine  Other (See Comments)   Patient reported medication made her feel off,not good. She said she wasn't able to describe the feeling any better.She asked not to take the medication anymore.    Ranitidine Hives   Sulfasalazine Other (See Comments)   Stomach upset   Topiramate Nausea Only        Medication List     STOP taking these medications    traMADol  50 MG tablet Commonly known as: ULTRAM         TAKE these medications    acetaminophen  325 MG tablet Commonly known as: TYLENOL  Take 2 tablets (650 mg total) by mouth every 6 (six) hours as needed for mild pain (pain score 1-3), fever or moderate pain (pain score 4-6).   AeroChamber MV inhaler Use as instructed   albuterol  108 (90 Base) MCG/ACT inhaler Commonly known as: VENTOLIN  HFA Inhale 1-2 puffs into the lungs every 4 (four) hours as needed for wheezing or shortness of breath.   budesonide -formoterol  160-4.5 MCG/ACT inhaler Commonly known as: SYMBICORT Inhale 2 puffs into the lungs 2 (two) times daily.   cephALEXin  500 MG capsule Commonly known as: KEFLEX  Take 1 capsule (500 mg total) by mouth every 8 (eight) hours for 5 days.   cyanocobalamin  1000 MCG tablet Take 1 tablet (1,000 mcg total) by mouth daily. What changed: how much to take   cyclobenzaprine  5 MG tablet Commonly known as: FLEXERIL  Take 0.5-1 tablets (2.5-5 mg total) by mouth 3 (three) times daily as needed for muscle spasms.   Dexilant 60 MG capsule Generic drug: dexlansoprazole Take 60 mg by mouth daily.   Fluocinolone Acetonide 0.01 % Oil Place 2-4 drops into both ears at bedtime as needed (itching). Max usage 10 days per month   fluticasone  50 MCG/ACT nasal spray Commonly known as: FLONASE  Place into both nostrils daily.   furosemide  40 MG tablet Commonly known as: LASIX  Take 1 tablet (40 mg total) by mouth daily.   Integra 62.5-62.5-40-3 MG Caps Take 1 capsule by mouth daily.   magnesium  oxide 400 MG tablet Commonly known as: MAG-OX Take 400 mg by mouth daily.   ondansetron  4 MG disintegrating tablet Commonly known as: ZOFRAN -ODT Take 1 tablet (4 mg total) by mouth every 6 (six) hours as needed for nausea or vomiting. OK to take prior to meals / prior to taking medications What changed:  when to take this reasons to take this additional instructions   predniSONE  5 MG tablet Commonly known as: DELTASONE  Take 5 mg by mouth  daily.   QC Tumeric Complex 500 MG Caps Generic drug: Turmeric Take 1 tablet by mouth daily.   traZODone  50 MG tablet Commonly known as: DESYREL  Take 50-100 mg by mouth.   Vitamin D3 50 MCG (2000 UT) capsule Take 2,000 Units by mouth daily.   Voltaren  1 % Gel Generic drug: diclofenac   Sodium Apply 2 g topically as needed.         Contact information for follow-up providers     [facility] Follow up in 2 week(s).   Specialty: Radiology Why: A Radiology scheduler will contact you to schedule an outpatient follow-up/ consult appointment within 2 weeks of discharge. If you have not been contacted to schedule after 2 weeks, please reach out to clinic. Contact information: [address] [employer] [address] [place]  [address] [phone]        Kip Lynwood Double, PA-C Follow up.   Specialty: Physician Assistant Why: Follow-up next week for skin check of the right hand. Contact information: [address] [place] [address] [phone]         Ashley Mack ORN, MD. Go on 02/29/2024.   Specialty: Internal Medicine Why: Appointment @3 :00 pm Contact information: [address] [place] [address] [phone]              Contact information for after-discharge care     Home Medical Care     [facility]) .   Service: Home Health Services                     Allergies[1]   Subjective: pt reports back pain but no other concerns, pain is controlled and she declined opiate meds.    Discharge Exam: BP 137/70 (BP Location: Left Arm)   Pulse 92   Temp 98.1 F (36.7 C)   Resp 18   Ht 5' 2 (1.575 m)   Wt 58 kg   SpO2 99%   BMI 23.39 kg/m  General: Pt is alert, awake, not in acute distress Cardiovascular: RRR, S1/S2 +, no rubs, no gallops Respiratory: dimished breath sounds but otherwise clear  Abdominal: Soft, NT, ND, bowel sounds + Extremities: no  edema, no cyanosis     The results of significant diagnostics from this hospitalization (including imaging, microbiology, ancillary and laboratory) are listed below for reference.     Microbiology: Recent Results (from the past 240 hours)  Blood Culture (routine x 2)     Status: None   Collection Time: 02/15/24  8:49 PM   Specimen: BLOOD  Result Value Ref Range Status   Specimen Description BLOOD BLOOD RIGHT ARM  Final   Special Requests   Final    BOTTLES DRAWN AEROBIC AND ANAEROBIC Blood Culture adequate volume   Culture   Final    NO GROWTH 5 DAYS Performed at [facility], [address], [place], [place] [address]    Report Status 02/21/2024 FINAL  Final  Blood Culture (routine x 2)     Status: None   Collection Time: 02/15/24  8:49 PM   Specimen: BLOOD LEFT ARM  Result Value Ref Range Status   Specimen Description BLOOD LEFT ARM  Final   Special Requests   Final    BOTTLES DRAWN AEROBIC AND ANAEROBIC Blood Culture adequate volume   Culture   Final    NO GROWTH 5 DAYS Performed at [facility], [address] [place], [place] [address]    Report Status 02/20/2024 FINAL  Final  Aerobic/Anaerobic Culture w Gram Stain (surgical/deep wound)     Status: None   Collection Time: 02/15/24  8:49 PM   Specimen: Wound; Abscess  Result Value Ref Range Status   Specimen Description   Final    WOUND Performed at [facility], [address], [place], [place] [address]    Special Requests   Final    NONE Performed at [facility], [address] [place], [place] [address]    Gram Stain   Final    FEW WBC PRESENT, PREDOMINANTLY PMN RARE GRAM POSITIVE COCCI    Culture   Final    FEW STAPHYLOCOCCUS AUREUS NO ANAEROBES ISOLATED Performed at [facility], [address] [place], [place] [address]    Report Status 02/20/2024 FINAL  Final   Organism ID, Bacteria STAPHYLOCOCCUS AUREUS  Final      Susceptibility   Staphylococcus  aureus - MIC*    CIPROFLOXACIN <=0.5 SENSITIVE Sensitive     ERYTHROMYCIN <=0.25 SENSITIVE Sensitive     GENTAMICIN <=0.5 SENSITIVE Sensitive     OXACILLIN <=0.25 SENSITIVE Sensitive     TETRACYCLINE <=1 SENSITIVE Sensitive     VANCOMYCIN  <=0.5 SENSITIVE Sensitive     TRIMETH/SULFA <=10 SENSITIVE Sensitive     CLINDAMYCIN <=0.25 SENSITIVE Sensitive     RIFAMPIN <=0.5 SENSITIVE Sensitive     Inducible Clindamycin NEGATIVE Sensitive     LINEZOLID 2 SENSITIVE Sensitive     * FEW STAPHYLOCOCCUS AUREUS  Resp panel by RT-PCR (RSV, Flu A&B, Covid) Anterior Nasal Swab     Status: None   Collection Time: 02/16/24 10:00 AM   Specimen: Anterior Nasal Swab  Result Value Ref Range Status   SARS Coronavirus 2 by RT PCR NEGATIVE NEGATIVE Final    Comment: (NOTE) SARS-CoV-2 target nucleic acids are NOT DETECTED.  The SARS-CoV-2 RNA is generally detectable in upper respiratory specimens during the acute phase of infection. The lowest concentration of SARS-CoV-2 viral copies this assay can detect is 138 copies/mL. A negative result does not preclude SARS-Cov-2 infection and should not be used as the sole basis for treatment or other patient management decisions. A negative result may occur with  improper specimen collection/handling, submission of specimen other than nasopharyngeal swab, presence of viral mutation(s) within the areas targeted by this assay, and inadequate number of viral copies(<138 copies/mL). A negative result must be combined with clinical observations, patient history, and epidemiological information. The expected result is Negative.  Fact Sheet for Patients:  bloggercourse.com  Fact Sheet for Healthcare Providers:  seriousbroker.it  This test is no t yet approved or cleared by the [place]  FDA and  has been authorized for detection and/or diagnosis of SARS-CoV-2 by FDA under an Emergency Use Authorization (EUA). This  EUA will remain  in effect (meaning this test can be used) for the duration of the COVID-19 declaration under Section 564(b)(1) of the Act, 21 U.S.C.section 360bbb-3(b)(1), unless the authorization is terminated  or revoked sooner.       Influenza A by PCR NEGATIVE NEGATIVE Final   Influenza B by PCR NEGATIVE NEGATIVE Final    Comment: (NOTE) The Xpert Xpress SARS-CoV-2/FLU/RSV plus assay is intended as an aid in the diagnosis of influenza from Nasopharyngeal swab specimens and should not be used as a sole basis for treatment. Nasal washings and aspirates are unacceptable for Xpert Xpress SARS-CoV-2/FLU/RSV testing.  Fact Sheet for Patients: bloggercourse.com  Fact Sheet for Healthcare Providers: seriousbroker.it  This test is not yet approved or cleared by the [place]  FDA and has been authorized for detection and/or diagnosis of SARS-CoV-2 by FDA under an Emergency Use Authorization (EUA). This EUA will remain in effect (meaning this test can be used) for the duration of the COVID-19 declaration under Section 564(b)(1) of the Act,  21 U.S.C. section 360bbb-3(b)(1), unless the authorization is terminated or revoked.     Resp Syncytial Virus by PCR NEGATIVE NEGATIVE Final    Comment: (NOTE) Fact Sheet for Patients: bloggercourse.com  Fact Sheet for Healthcare Providers: seriousbroker.it  This test is not yet approved or cleared by the [place]  FDA and has been authorized for detection and/or diagnosis of SARS-CoV-2 by FDA under an Emergency Use Authorization (EUA). This EUA will remain in effect (meaning this test can be used) for the duration of the COVID-19 declaration under Section 564(b)(1) of the Act, 21 U.S.C. section 360bbb-3(b)(1), unless the authorization is terminated or revoked.  Performed at [facility], [address] [place], [place]  [address]      Labs: BNP (last 3 results) No results for input(s): BNP in the last 8760 hours. Basic Metabolic Panel: Recent Labs  Lab 02/19/24 0444 02/21/24 0905 02/23/24 0707 02/24/24 0543  NA 136 134* 136 134*  K 3.7 5.0 4.2 4.3  CL 93* 89* 91* 90*  CO2 35* 38* 38* 36*  GLUCOSE 178* 97 152* 90  BUN 36* 38* 39* 45*  CREATININE 0.71 0.79 0.80 0.81  CALCIUM  9.1 9.6 9.3 9.5   Liver Function Tests: No results for input(s): AST, ALT, ALKPHOS, BILITOT, PROT, ALBUMIN in the last 168 hours. No results for input(s): LIPASE, AMYLASE in the last 168 hours. No results for input(s): AMMONIA in the last 168 hours. CBC: Recent Labs  Lab 02/19/24 0444 02/21/24 0905 02/22/24 0754 02/23/24 0538 02/24/24 0543  WBC 15.7* 13.4* 13.7* 14.7* 12.7*  HGB 10.6* 11.1* 10.3* 10.0* 10.2*  HCT 32.7* 35.0* 32.2* 31.5* 32.3*  MCV 88.4 88.4 88.5 90.0 91.5  PLT 445* 422* 387 360 317   Cardiac Enzymes: No results for input(s): CKTOTAL, CKMB, CKMBINDEX, TROPONINI in the last 168 hours. BNP: Invalid input(s): POCBNP CBG: No results for input(s): GLUCAP in the last 168 hours. D-Dimer No results for input(s): DDIMER in the last 72 hours. Hgb A1c No results for input(s): HGBA1C in the last 72 hours. Lipid Profile No results for input(s): CHOL, HDL, LDLCALC, TRIG, CHOLHDL, LDLDIRECT in the last 72 hours. Thyroid  function studies No results for input(s): TSH, T4TOTAL, T3FREE, THYROIDAB in the last 72 hours.  Invalid input(s): FREET3 Anemia work up No results for input(s): VITAMINB12, FOLATE, FERRITIN, TIBC, IRON , RETICCTPCT in the last 72 hours. Urinalysis    Component Value Date/Time   COLORURINE STRAW (A) 08/06/2017 1121   APPEARANCEUR CLEAR (A) 08/06/2017 1121   LABSPEC 1.004 (L) 08/06/2017 1121   PHURINE 7.0 08/06/2017 1121   GLUCOSEU NEGATIVE 08/06/2017 1121   HGBUR SMALL (A) 08/06/2017 1121   BILIRUBINUR NEGATIVE  08/06/2017 1121   KETONESUR 20 (A) 08/06/2017 1121   PROTEINUR NEGATIVE 08/06/2017 1121   NITRITE NEGATIVE 08/06/2017 1121   LEUKOCYTESUR NEGATIVE 08/06/2017 1121   Sepsis Labs Recent Labs  Lab 02/21/24 0905 02/22/24 0754 02/23/24 0538 02/24/24 0543  WBC 13.4* 13.7* 14.7* 12.7*   Microbiology Recent Results (from the past 240 hours)  Blood Culture (routine x 2)     Status: None   Collection Time: 02/15/24  8:49 PM   Specimen: BLOOD  Result Value Ref Range Status   Specimen Description BLOOD BLOOD RIGHT ARM  Final   Special Requests   Final    BOTTLES DRAWN AEROBIC AND ANAEROBIC Blood Culture adequate volume   Culture   Final    NO GROWTH 5 DAYS Performed at [facility], [address] [place], [place]  [address]    Report Status 02/21/2024 FINAL  Final  Blood Culture (routine x 2)     Status: None   Collection Time: 02/15/24  8:49 PM   Specimen: BLOOD LEFT ARM  Result Value Ref Range Status   Specimen Description BLOOD LEFT ARM  Final   Special Requests   Final    BOTTLES DRAWN AEROBIC AND ANAEROBIC Blood Culture adequate volume   Culture   Final    NO GROWTH 5 DAYS Performed at [facility], [address] [place], [place] [address]    Report Status 02/20/2024 FINAL  Final  Aerobic/Anaerobic Culture w Gram Stain (surgical/deep wound)     Status: None   Collection Time: 02/15/24  8:49 PM   Specimen: Wound; Abscess  Result Value Ref Range Status   Specimen Description   Final    WOUND Performed at [facility], [address], [place], [place] [address]    Special Requests   Final    NONE Performed at [facility], [address] [place], [place] [address]    Gram Stain   Final    FEW WBC PRESENT, PREDOMINANTLY PMN RARE GRAM POSITIVE COCCI    Culture   Final    FEW STAPHYLOCOCCUS AUREUS NO ANAEROBES ISOLATED Performed at [facility], [address] [place], [place] [address]    Report Status 02/20/2024  FINAL  Final   Organism ID, Bacteria STAPHYLOCOCCUS AUREUS  Final      Susceptibility   Staphylococcus aureus - MIC*    CIPROFLOXACIN <=0.5 SENSITIVE Sensitive     ERYTHROMYCIN <=0.25 SENSITIVE Sensitive     GENTAMICIN <=0.5 SENSITIVE Sensitive     OXACILLIN <=0.25 SENSITIVE Sensitive     TETRACYCLINE <=1 SENSITIVE Sensitive     VANCOMYCIN  <=0.5 SENSITIVE Sensitive     TRIMETH/SULFA <=10 SENSITIVE Sensitive     CLINDAMYCIN <=0.25 SENSITIVE Sensitive     RIFAMPIN <=0.5 SENSITIVE Sensitive     Inducible Clindamycin NEGATIVE Sensitive     LINEZOLID 2 SENSITIVE Sensitive     * FEW STAPHYLOCOCCUS AUREUS  Resp panel by RT-PCR (RSV, Flu A&B, Covid) Anterior Nasal Swab     Status: None   Collection Time: 02/16/24 10:00 AM   Specimen: Anterior Nasal Swab  Result Value Ref Range Status   SARS Coronavirus 2 by RT PCR NEGATIVE NEGATIVE Final    Comment: (NOTE) SARS-CoV-2 target nucleic acids are NOT DETECTED.  The SARS-CoV-2 RNA is generally detectable in upper respiratory specimens during the acute phase of infection. The lowest concentration of SARS-CoV-2 viral copies this assay can detect is 138 copies/mL. A negative result does not preclude SARS-Cov-2 infection and should not be used as the sole basis for treatment or other patient management decisions. A negative result may occur with  improper specimen collection/handling, submission of specimen other than nasopharyngeal swab, presence of viral mutation(s) within the areas targeted by this assay, and inadequate number of viral copies(<138 copies/mL). A negative result must be combined with clinical observations, patient history, and epidemiological information. The expected result is Negative.  Fact Sheet for Patients:  bloggercourse.com  Fact Sheet for Healthcare Providers:  seriousbroker.it  This test is no t yet approved or cleared by the [place]  FDA and  has been  authorized for detection and/or diagnosis of SARS-CoV-2 by FDA under an Emergency Use Authorization (EUA). This EUA will remain  in effect (meaning this test can be used) for the duration of the COVID-19 declaration under Section  564(b)(1) of the Act, 21 U.S.C.section 360bbb-3(b)(1), unless the authorization is terminated  or revoked sooner.       Influenza A by PCR NEGATIVE NEGATIVE Final   Influenza B by PCR NEGATIVE NEGATIVE Final    Comment: (NOTE) The Xpert Xpress SARS-CoV-2/FLU/RSV plus assay is intended as an aid in the diagnosis of influenza from Nasopharyngeal swab specimens and should not be used as a sole basis for treatment. Nasal washings and aspirates are unacceptable for Xpert Xpress SARS-CoV-2/FLU/RSV testing.  Fact Sheet for Patients: bloggercourse.com  Fact Sheet for Healthcare Providers: seriousbroker.it  This test is not yet approved or cleared by the [place]  FDA and has been authorized for detection and/or diagnosis of SARS-CoV-2 by FDA under an Emergency Use Authorization (EUA). This EUA will remain in effect (meaning this test can be used) for the duration of the COVID-19 declaration under Section 564(b)(1) of the Act, 21 U.S.C. section 360bbb-3(b)(1), unless the authorization is terminated or revoked.     Resp Syncytial Virus by PCR NEGATIVE NEGATIVE Final    Comment: (NOTE) Fact Sheet for Patients: bloggercourse.com  Fact Sheet for Healthcare Providers: seriousbroker.it  This test is not yet approved or cleared by the [place]  FDA and has been authorized for detection and/or diagnosis of SARS-CoV-2 by FDA under an Emergency Use Authorization (EUA). This EUA will remain in effect (meaning this test can be used) for the duration of the COVID-19 declaration under Section 564(b)(1) of the Act, 21 U.S.C. section 360bbb-3(b)(1), unless the  authorization is terminated or revoked.  Performed at [facility], [address] [place], [place] [address]    Imaging ECHOCARDIOGRAM COMPLETE Result Date: 02/17/2024    ECHOCARDIOGRAM REPORT   Patient Name:   Ashley Mack Date of Exam: 02/17/2024 Medical Rec #:  [id number]     Height:       62.0 in Accession #:    [id number]    Weight:       132.3 lb Date of Birth:  [birth year]     BSA:          1.604 m Patient Age:    78 years      BP:           164/73 mmHg Patient Gender: F             HR:           100 bpm. Exam Location:  [facility] Procedure: 2D Echo, Cardiac Doppler and Color Doppler (Both Spectral and Color            Flow Doppler were utilized during procedure). Indications:     CHF--acute diastolic I50.31  History:         Patient has prior history of Echocardiogram examinations, most                  recent 03/20/2021.  Sonographer:     Christopher Furnace Referring Phys:  [id number] CHARLIE PATTERSON Diagnosing Phys: Evalene Lunger MD  Sonographer Comments: Technically challenging study due to limited acoustic windows, no parasternal window and no subcostal window. IMPRESSIONS  1. Left ventricular ejection fraction, by estimation, is 60 to 65%. The left ventricle has normal function. The left ventricle has no regional wall motion abnormalities. Left ventricular diastolic parameters are consistent with Grade I diastolic dysfunction (impaired relaxation).  2. Right ventricular systolic function is normal. The right ventricular size is normal. There is normal pulmonary artery systolic pressure. The estimated right ventricular systolic pressure is 21.6 mmHg.  3. The mitral valve is normal in structure. Mild mitral valve regurgitation. No evidence of mitral stenosis.  4. The aortic valve is normal in structure. Aortic valve regurgitation is mild. No aortic stenosis is present.  5. The inferior vena cava is normal in size with greater than 50% respiratory variability, suggesting right atrial pressure of 3 mmHg.  FINDINGS  Left Ventricle: Left ventricular ejection fraction, by estimation, is 60 to 65%. The left ventricle has normal function. The left ventricle has no regional wall motion abnormalities. Strain was performed and the global longitudinal strain is indeterminate. The left ventricular internal cavity size was normal in size. There is no left ventricular hypertrophy. Left ventricular diastolic parameters are consistent with Grade I diastolic dysfunction (impaired relaxation). Right Ventricle: The right ventricular size is normal. No increase in right ventricular wall thickness. Right ventricular systolic function is normal. There is normal pulmonary artery systolic pressure. The tricuspid regurgitant velocity is 2.04 m/s, and  with an assumed right atrial pressure of 5 mmHg, the estimated right ventricular systolic pressure is 21.6 mmHg. Left Atrium: Left atrial size was normal in size. Right Atrium: Right atrial size was normal in size. Pericardium: There is no evidence of pericardial effusion. Mitral Valve: The mitral valve is normal in structure. Mild mitral annular calcification. Mild mitral valve regurgitation. No evidence of mitral valve stenosis. MV peak gradient, 4.8 mmHg. The mean mitral valve gradient is 2.0 mmHg. Tricuspid Valve: The tricuspid valve is normal in structure. Tricuspid valve regurgitation is not demonstrated. No evidence of tricuspid stenosis. Aortic Valve: The aortic valve is normal in structure. Aortic valve regurgitation is mild. No aortic stenosis is present. Aortic valve mean gradient measures 5.2 mmHg. Aortic valve peak gradient measures 9.4 mmHg. Aortic valve area, by VTI measures 2.16 cm. Pulmonic Valve: The pulmonic valve was normal in structure. Pulmonic valve regurgitation is not visualized. No evidence of pulmonic stenosis. Aorta: The aortic root is normal in size and structure. Venous: The inferior vena cava is normal in size with greater than 50% respiratory variability,  suggesting right atrial pressure of 3 mmHg. IAS/Shunts: No atrial level shunt detected by color flow Doppler. Additional Comments: 3D was performed not requiring image post processing on an independent workstation and was indeterminate.  LEFT VENTRICLE PLAX 2D LVIDd:         4.20 cm   Diastology LVIDs:         3.00 cm   LV e' medial:    5.44 cm/s LV PW:         0.90 cm   LV E/e' medial:  15.2 LV IVS:        1.20 cm   LV e' lateral:   6.74 cm/s LVOT diam:     2.00 cm   LV E/e' lateral: 12.3 LV SV:         60 LV SV Index:   37 LVOT Area:     3.14 cm LV IVRT:       124 msec  RIGHT VENTRICLE RV Basal diam:  3.60 cm  PULMONARY VEINS RV Mid diam:    2.80 cm  Diastolic Velocity: 24.90 cm/s                          S/D Velocity:       1.50                          Systolic Velocity:  36.40 cm/s LEFT ATRIUM             Index        RIGHT ATRIUM          Index LA diam:        3.60 cm 2.24 cm/m   RA Area:     8.08 cm LA Vol (A2C):   85.0 ml 53.00 ml/m  RA Volume:   11.90 ml 7.42 ml/m LA Vol (A4C):   41.7 ml 26.00 ml/m LA Biplane Vol: 63.9 ml 39.85 ml/m  AORTIC VALVE AV Area (Vmax):    2.08 cm AV Area (Vmean):   1.88 cm AV Area (VTI):     2.16 cm AV Vmax:           153.25 cm/s AV Vmean:          106.325 cm/s AV VTI:            0.276 m AV Peak Grad:      9.4 mmHg AV Mean Grad:      5.2 mmHg LVOT Vmax:         101.60 cm/s LVOT Vmean:        63.550 cm/s LVOT VTI:          0.190 m LVOT/AV VTI ratio: 0.69  AORTA Ao Root diam: 1.10 cm MITRAL VALVE                TRICUSPID VALVE MV Area (PHT): 5.27 cm     TR Peak grad:   16.6 mmHg MV Area VTI:   3.14 cm     TR Vmax:        204.00 cm/s MV Peak grad:  4.8 mmHg MV Mean grad:  2.0 mmHg     SHUNTS MV Vmax:       1.10 m/s     Systemic VTI:  0.19 m MV Vmean:      73.9 cm/s    Systemic Diam: 2.00 cm MV Decel Time: 144 msec MV E velocity: 82.70 cm/s MV A velocity: 112.00 cm/s MV E/A ratio:  0.74 Evalene Lunger MD Electronically signed by Evalene Lunger MD Signature Date/Time:  02/17/2024/2:47:56 PM    Final    DG Chest Port 1 View Result Date: 02/17/2024 EXAM: 1 VIEW(S) XRAY OF THE CHEST 02/17/2024 07:29:23 AM COMPARISON: 01/19/2022 CLINICAL HISTORY: Shortness of breath FINDINGS: LUNGS AND PLEURA: Apical lordotic positioning. Hyperinflation. Interstitial coarsening. Left base subsegmental atelectasis and/or scar, similar. No pleural effusion. No pneumothorax. HEART AND MEDIASTINUM: Moderate cardiomegaly. Transverse aortic atherosclerosis. Right paratracheal soft tissue fullness is secondary to tortuous great vessels when compared to the prior CT of 03/18/2021. BONES AND SOFT TISSUES: Vertebral augmentation at multiple thoracic levels, suboptimally evaluated. Right shoulder osteoarthritis. Remote right rib fractures. No acute osseous abnormality. IMPRESSION: 1. No acute explanation for shortness of breath. 2. Hyperinflation and interstitial thickening are likely related to smoking/COPD. 3. Aortic atherosclerosis (ICD-10 I70.0). Electronically signed by: Rockey Kilts MD 02/17/2024 11:36 AM EST RP Workstation: [id number]      Time coordinating discharge: over 30 minutes  SIGNED:  Laneta Blunt DO [facility]       [1]  Allergies Allergen Reactions   Methotrexate And Trimetrexate Nausea And Vomiting and Other (See Comments)    Flu like symptoms   Other Nausea Only and Other (See Comments)    Arthritis medications . Unsure of what the med was.   Gabapentin    Prochlorperazine  Other (See Comments)    Patient reported medication made her  feel off,not good. She said she wasn't able to describe the feeling any better.She asked not to take the medication anymore.    Ranitidine Hives   Sulfasalazine Other (See Comments)    Stomach upset   Topiramate Nausea Only

## 2024-02-26 ENCOUNTER — Other Ambulatory Visit: Payer: Self-pay

## 2024-02-29 NOTE — Progress Notes (Signed)
 Chief Complaint: Chief Complaint  Patient presents with   Right Hand - Pain   Ashley Mack is a 78 y.o. female who presents today for a skin check status post a bedside I&D of a dorsal abscess involving the right hand.  The patient was admitted to the hospital and diagnosed with a superficial abscess involving the dorsal aspect of her right hand.  She underwent a bedside I&D on 02/19/2024 and underwent dressing changes while admitted.  The culture demonstrated Staph aureus she was discharged home on oral antibiotics which she has completed at this time.  She reports that the right hand is doing well at today's visit.  She denies any repeat trauma or injury affecting the right hand.  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  She denies any fevers or chills.  She has not noticed any drainage coming from the right hand.  She has kept the dressing intact since being discharged from the hospital.  Of note the patient does have documentation of a T10 compression fracture with continued pain.  Before she was admitted for her right hand the patient was in discussion with providers about having a potential kyphoplasty.  The patient would like to be referred to interventional radiology for consideration for kyphoplasty of the T10 vertebrae.  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)    a) intolerant of methotrexate and Imuran   Iron  deficiency anemia    Migraines    Osteoporosis    a) bilateral ankle fractures, b) wrist fracture c) multiple vertebral compression fracture   Rheumatoid arthritis(714.0)  (CMS/HHS-HCC)    a) seropositive, positive anti-CCP antibodies, b) methotrexate complicated by nausea and headaches, c) Imuran complicated by nausea, d) GI upset with leflunomide, e) GI upset with sulfasalazine    Past Surgical History: Past Surgical History:  Procedure Laterality Date   BILATERAL OOPHORECTOMY  2003   COLONOSCOPY  03/18/2009   Repeat 10 Years   COLONOSCOPY  12/09/2016   Tubular adenoma of colon/Repeat 36yrs/TKT   EGD  12/09/2016   Normal EGD/No Repeat/TKT   KYPHOPLASTY  08/10/2017   L2  Dr.Menz    CARDIAC CATHETERIZATION     EGD  06/05/2011, 06/13/2010, 10/12/2008   No repeat-rtm   L1 & L4 Kyphoplasty 09/09/17     Dr Ozell Flake   T11 Kyphoplasty 09/28/17     Ozell Flake, MD    Past Family History: Family History  Problem Relation Age of Onset   Kidney disease Mother    No Known Problems Father    Diabetes type II Brother     Medications: Current Outpatient Medications  Medication Sig Dispense Refill   albuterol  MDI, PROVENTIL , VENTOLIN , PROAIR , HFA 90 mcg/actuation inhaler INHALE 2 PUFFS BY MOUTH EVERY 6 HOURS AS NEEDED 18 g 3   cholecalciferol  (VITAMIN D3) 2,000 unit capsule Take 2,000 Units by mouth once daily.     cyanocobalamin , vitamin B-12, (VITAMIN B-12 ORAL) Take 1,000 mg by mouth once daily     cyclobenzaprine  (FLEXERIL ) 5 MG tablet TAKE 1 TABLET BY  MOUTH THREE TIMES DAILY AS NEEDED (MAY  TAKE  ONE-HALF  INSTEAD) 90 tablet 0   dexlansoprazole (DEXILANT) 60 mg DR capsule Take 1 capsule (60 mg total) by mouth once daily 90 capsule 3   fluticasone  propionate (FLONASE ) 50 mcg/actuation nasal spray Place 2 sprays into both nostrils once daily as needed for Rhinitis     GINGER ROOT-PYRIDOXINE HCL,B6, ORAL Take by mouth once daily     iron  fum,ps cmplx-vit C-niacin (INTEGRA) 125-40-3 mg Cap Take 1 capsule by mouth once daily 90 capsule 3   magnesium  oxide 400 mg magnesium  Tab Take 400 mg by mouth once daily     mometasone  (ELOCON ) 0.1  % lotion USE 4 TO 5 DROPS IN EARS AT BEDTIME DAILY FOR 14 DAYS, THEN USE AS NEEDED THEREAFTER     ondansetron  (ZOFRAN -ODT) 4 MG disintegrating tablet Take 1 tablet (4 mg total) by mouth every 8 (eight) hours as needed for Nausea 90 tablet 0   predniSONE  (DELTASONE ) 5 MG tablet Take 1 tablet (5 mg total) by mouth once daily for 180 days 90 tablet 1   sodium chloride  0.9 % SprA Place into one nostril once daily as needed        SYMBICORT 80-4.5 mcg/actuation inhaler Inhale 2 puffs by mouth twice daily 11 g 3   traZODone  (DESYREL ) 50 MG tablet Take 1-2 tablets (50-100 mg total) by mouth at bedtime as needed for Sleep 180 tablet 3   TURMERIC ORAL Take by mouth once daily     No current facility-administered medications for this visit.    Allergies: Allergies  Allergen Reactions   Darvocet A500 [Propoxyphene N-Acetaminophen ] Nausea   Mobic [Meloxicam] Vomiting   Oxycodone  Unknown    Dizzy, foggy feeling   Phenergan [Promethazine] Dizziness   Sulfasalazine Other (See Comments)    Stomach upset   Topamax [Topiramate] Nausea   Tramadol  Vomiting   Vicodin [Hydrocodone -Acetaminophen ] Vomiting   Zantac [Ranitidine Hcl] Hives     Review of Systems:  A comprehensive 14 point ROS was performed, reviewed by me today, and the pertinent orthopaedic findings are documented in the HPI.   Exam: BP 110/60   Ht 154.9 cm (5' 1)   LMP  (LMP Unknown)   BMI 23.24 kg/m  General/Constitutional: The patient appears to be well-nourished, well-developed, and in no acute distress. Neuro/Psych: Normal mood and affect, oriented to person, place and time. Eyes: Non-icteric.  Pupils are equal, round, and reactive to light, and exhibit synchronous movement. ENT: Unremarkable. Lymphatic: No palpable adenopathy. Respiratory: Non-labored breathing Cardiovascular: No edema, swelling or tenderness, except as noted in detailed exam. Integumentary: No impressive skin lesions present, except as noted  in detailed exam. Musculoskeletal: Unremarkable, except as noted in detailed exam.  The patient presents today in a wheelchair.  The patient is tender to palpation over the thoracic spine directly over the vertebral bodies.  The patient does still have a dressing applied to the right hand.  Dressing was removed at today's appointment, skin examination of the right hand demonstrates that the swelling and erythema noted to the dorsal aspect The I&D site has excellent scab formation of the hand has significantly improved.  Without any purulent material.  She reports sensation is intact to the right hand, limited range of motion due to her previous ulnar nerve injury.  Cap refills intact to each individual digit.  She is able to gently flex and extend right wrist without significant pain.  No opening at the previous I&D site.  Imaging:  MRI THORACIC SPINE WITHOUT INTRAVENOUS CONTRAST  02/16/2024 02:05:51 PM   TECHNIQUE:  Multiplanar multisequence MRI of the thoracic spine was performed without the  administration of intravenous contrast.   COMPARISON:  MR Thoracic Spine 09/16/2019. CTA chest 03/18/2021.   CLINICAL HISTORY:  Mid-back pain, prior compression fracture.   FINDINGS:   LIMITATIONS:  The examination is motion degraded, intermittently severely so on axial  sequences.   BONES AND ALIGNMENT:  Exaggerated thoracic kyphosis. No significant listhesis. Compression fractures  at every thoracic level as well as partially visualized in the lumbar spine  with sparing of L3. Previous vertebral augmentation at T5, T6, and T11 to L2.  Prominent marrow edema in the T10 vertebral body centered on the superior  endplate consistent with an acute on chronic fracture with approximately 35%  vertebral body height loss. Slight bulging of the anterior T10 vertebral body  cortex and minimal retropulsion. No definite underlying bone lesion.   SPINAL CORD:  Normal spinal cord volume. Normal spinal  cord signal.   SOFT TISSUES:  Mild paravertebral soft tissue edema and/or fluid at T10.  Trace bilateral pleural effusions.   DEGENERATIVE CHANGES:  Mild retropulsion associated with most thoracic compression fractures without  compressive spinal stenosis.    IMPRESSION:  1. Acute on chronic T10 compression fracture with 35% vertebral body height  loss.  2. 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   Impression: Abscess of dorsum of hand, right [L02.511] Abscess of dorsum of hand, right  (primary encounter diagnosis) Closed wedge compression fracture of T10 vertebra, initial encounter (CMS/HHS-HCC) Cellulitis of right hand  Plan:  1.  Treatment options were discussed today with the patient. 2.  Pertaining to her right hand, the previous I&D site is well-healed at this time without any evidence for active infection. 3.  She may shower and get the area wet at this time.  She may perform activities as tolerated with the right hand. 4.  The patient does have a T10 compression fracture, referral to interventional radiology has been placed for the patient. 5.  The patient will follow-up with me in 1 month for repeat skin check of the right hand, if doing well she can cancel this appointment. 6.  They can call the clinic they have any questions, new symptoms develop or symptoms worsen.  This office visit took 45 minutes, of which >50% involved patient counseling/education.  Review of the [place] CSRS was performed in accordance of the [facility] prior to dispensing any controlled drugs.  This note was generated in part with voice recognition software and I apologize for any typographical errors that were not detected and corrected.  DOROTHA Gustavo Level, PA-C, CAQ-OS [facility]

## 2024-03-07 ENCOUNTER — Ambulatory Visit
Admission: RE | Admit: 2024-03-07 | Discharge: 2024-03-07 | Disposition: A | Discharge status: Home / Self Care | Source: Ambulatory Visit | Attending: Urology | Admitting: Urology

## 2024-03-07 DIAGNOSIS — S22000A Wedge compression fracture of unspecified thoracic vertebra, initial encounter for closed fracture: Secondary | ICD-10-CM

## 2024-03-07 NOTE — Consult Note (Addendum)
 "      Chief Complaint: T10 compression fracture  Referring Physician(s): Carim,Charles A  History of Present Illness: Ashley Mack is a 78 y.o. female outpatient. History of RA,  osteoporosis,migraines, emphysema,  iron  deficiency anemia on infusions. Multiple compression fractures s/p T5 Kyphoplasty on 4.5.21, L2 performed on 7.30.19, T11 on 7.16.19, L1, L4 on 6.27.19, and L2 on 5.29.19. all performed by orthopedics. Recently admitted to [facility]  for sepsis secondary to right arm abscess with cellulitis. Found two have a T10 compression fracture while being worked up for back pain during admission. MR thoracic spine dated 39.2.25 reads Acute on chronic T10 compression fracture with 35% vertebral body height loss.Patient states that she has had back pain since Feb 02, 2024. Patient present with her daughter  for T10 Kyphoplasty evaluation.  The pain as persistent and worsening. She rates the pain 7 /10. The pain is made worse with standing, walking, bending and respiration . It is made better with rest specifically in her recliner in which she can position herself accordingly  She denies any traumatic event the precipitated the back pain.  She has tried OTC and prescription as needed which with little resolution of pain. Prior to her recent admission she followed up with his PCP on 4.2.25  who prescribed him meloxicam and lidocaine  patches.  Most recent DEX was in August of 2013 scan was also ordered at that time. Again conservative measures showed  no improvement of pain. She denies numbness and tingling of her lower extremities. No loss of bladder or bowel. She states that the back pain refers across to her chest and  feels like it is pushing on my chest . She endorses pinpoint tenderness to the thoracic region.      The patient has kindly been referred to Interventional Radiology to discuss vertebral body augmentation of the T10 compression fracture. She is accompanied to the clinic today by her  daughter.  The pain has affected her mobility that is affecting her daily living.  She rates her pain an 7/10 and she scored a 23/24 on the Roland-[employer]ris Disability Questionnaire.   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    Iron  (Fe) deficiency anemia    Migraine headache    in past   Migraines    Motion sickness    No natural teeth    Osteoporosis    Rheumatoid arthritis (HCC)    Vertigo     Past Surgical History:  Procedure Laterality Date   CATARACT EXTRACTION W/PHACO Right 08/12/2016   Procedure: CATARACT EXTRACTION PHACO AND INTRAOCULAR LENS PLACEMENT (IOC)  Right;  Surgeon: Mittie Gaskin, MD;  Location: [facility];  Service: Ophthalmology;  Laterality: Right;   CATARACT EXTRACTION W/PHACO Left 09/30/2016   Procedure: CATARACT EXTRACTION PHACO AND INTRAOCULAR LENS PLACEMENT (IOC) Left;  Surgeon: Mittie Gaskin, MD;  Location: [facility];  Service: Ophthalmology;  Laterality: Left;   COLONOSCOPY     COLONOSCOPY WITH PROPOFOL  N/A 12/09/2016   Procedure: COLONOSCOPY WITH PROPOFOL ;  Surgeon: Toledo, Ladell POUR, MD;  Location: [facility];  Service:  Endoscopy;  Laterality: N/A;   ESOPHAGOGASTRODUODENOSCOPY     ESOPHAGOGASTRODUODENOSCOPY (EGD) WITH PROPOFOL  N/A 12/09/2016   Procedure: ESOPHAGOGASTRODUODENOSCOPY (EGD) WITH PROPOFOL ;  Surgeon: Toledo, Ladell POUR, MD;  Location: [facility];  Service: Endoscopy;  Laterality: N/A;   KYPHOPLASTY N/A 08/10/2017   Procedure: LYNN;  Surgeon: Kathlynn Sharper, MD;  Location: [facility];  Service: Orthopedics;  Laterality: N/A;   KYPHOPLASTY N/A 09/09/2017   Procedure: XBEYNEOJDUB-O8,O5;   Surgeon: Kathlynn Sharper, MD;  Location: [facility];  Service: Orthopedics;  Laterality: N/A;   KYPHOPLASTY N/A 09/28/2017   Procedure: XBEYNEOJDUB-U88;  Surgeon: Kathlynn Sharper, MD;  Location: [facility];  Service: Orthopedics;  Laterality: N/A;   KYPHOPLASTY N/A 06/19/2019   Procedure: KYPHOPLASTY T5;  Surgeon: Kathlynn Sharper, MD;  Location: [facility];  Service: Orthopedics;  Laterality: N/A;   OOPHORECTOMY Bilateral 2003   RIGHT/LEFT HEART CATH AND CORONARY ANGIOGRAPHY Bilateral 10/19/2019   Procedure: RIGHT/LEFT HEART CATH AND CORONARY ANGIOGRAPHY;  Surgeon: Florencio Cara BIRCH, MD;  Location: [facility];  Service: Cardiovascular;  Laterality: Bilateral;    Allergies: Methotrexate and trimetrexate, Other, Gabapentin, Prochlorperazine , Ranitidine, Sulfasalazine, and Topiramate  Medications: Prior to Admission medications  Medication Sig Start Date End Date Taking? Authorizing Provider  acetaminophen  (TYLENOL ) 325 MG tablet Take 2 tablets (650 mg total) by mouth every 6 (six) hours as needed for mild pain (pain score 1-3), fever or moderate pain (pain score 4-6). 02/24/24   Alexander, Natalie, DO  albuterol  (VENTOLIN  HFA) 108 [phone] Base) MCG/ACT inhaler Inhale 1-2 puffs into the lungs every 4 (four) hours as needed for wheezing or shortness of breath. 06/01/23   Van Knee, MD  budesonide -formoterol  (SYMBICORT) 160-4.5 MCG/ACT inhaler Inhale 2 puffs into the lungs 2 (two) times daily.    [provider]  Cholecalciferol  (VITAMIN D3) 2000 UNITS capsule Take 2,000 Units by mouth daily.     [provider]  cyanocobalamin  1000 MCG tablet Take 1 tablet (1,000 mcg total) by mouth daily. 02/24/24   Alexander, Natalie, DO  cyclobenzaprine  (FLEXERIL ) 5 MG tablet Take 0.5-1 tablets (2.5-5 mg total) by mouth 3 (three) times daily as needed for muscle spasms. 02/24/24   Alexander, Natalie, DO  dexlansoprazole (DEXILANT) 60 MG capsule Take 60 mg by mouth daily. Patient not taking:  Reported on 02/16/2024    [provider]  diclofenac  Sodium (VOLTAREN ) 1 % GEL Apply 2 g topically as needed.    [provider]  Fe Fum-FePoly-Vit C-Vit B3 (INTEGRA) 62.5-62.5-40-3 MG CAPS Take 1 capsule by mouth daily. 11/29/13   [provider]  Fluocinolone Acetonide 0.01 % OIL Place 2-4 drops into both ears at bedtime as needed (itching). Max usage 10 days per month    [provider]  fluticasone  (FLONASE ) 50 MCG/ACT nasal spray Place into both nostrils daily.    [provider]  furosemide  (LASIX ) 40 MG tablet Take 1 tablet (40 mg total) by mouth daily. 02/25/24   Alexander, Natalie, DO  magnesium  oxide (MAG-OX) 400 MG tablet Take 400 mg by mouth daily.    [provider]  ondansetron  (ZOFRAN -ODT) 4 MG disintegrating tablet Take 1 tablet (4 mg total) by mouth every 6 (six) hours as needed for nausea or vomiting. OK to take prior to meals / prior to taking medications 02/24/24   Alexander, Natalie, DO  predniSONE  (DELTASONE ) 5 MG tablet Take 5 mg by mouth daily.     [provider]  Spacer/Aero-Holding Chambers (AEROCHAMBER MV) inhaler Use as instructed 06/01/23   Van Knee,  MD  traZODone  (DESYREL ) 50 MG tablet Take 50-100 mg by mouth. 11/18/23 11/17/24  [provider]  Turmeric (QC TUMERIC COMPLEX) 500 MG CAPS Take 1 tablet by mouth daily.    [provider]     Family History  Problem Relation Age of Onset   Kidney disease Mother    Diabetes Mellitus II Brother     Social History   Socioeconomic History   Marital status: Widowed    Spouse name: Not on file   Number of children: Not on file   Years of education: Not on file   Highest education level: Not on file  Occupational History   Not on file  Tobacco Use   Smoking status: Former    Current packs/day: 0.00    Average packs/day: 1.0 packs/day    Types: Cigarettes    Quit date: 2009    Years since quitting: 16.9   Smokeless tobacco:  Never  Vaping Use   Vaping status: Never Used  Substance and Sexual Activity   Alcohol use: No   Drug use: No   Sexual activity: Not Currently  Other Topics Concern   Not on file  Social History Narrative   Not on file   Social Drivers of Health   Tobacco Use: Medium Risk (02/29/2024)   Received from [facility]   Patient History    Smoking Tobacco Use: Former    Smokeless Tobacco Use: Never    Passive Exposure: Not on file  Financial Resource Strain: Low Risk  (11/18/2023)   Received from [facility]   Overall Financial Resource Strain (CARDIA)    Difficulty of Paying Living Expenses: Not hard at all  Food Insecurity: No Food Insecurity (02/16/2024)   Epic    Worried About Radiation Protection Practitioner of Food in the Last Year: Never true    Ran Out of Food in the Last Year: Never true  Transportation Needs: No Transportation Needs (02/16/2024)   Epic    Lack of Transportation (Medical): No    Lack of Transportation (Non-Medical): No  Physical Activity: Not on file  Stress: Not on file  Social Connections: Moderately Isolated (02/16/2024)   Social Connection and Isolation Panel    Frequency of Communication with Friends and Family: More than three times a week    Frequency of Social Gatherings with Friends and Family: More than three times a week    Attends Religious Services: 1 to 4 times per year    Active Member of [employer] or Organizations: No    Attends Banker Meetings: Never    Marital Status: Widowed  Depression (PHQ2-9): Not on file  Alcohol Screen: Not on file  Housing: Low Risk (02/16/2024)   Epic    Unable to Pay for Housing in the Last Year: No    Number of Times Moved in the Last Year: 0    Homeless in the Last Year: No  Utilities: Not At Risk (02/16/2024)   Epic    Threatened with loss of utilities: No  Health Literacy: Not on file     Review of Systems: A 12 point ROS discussed and pertinent positives are indicated in the  HPI above.  All other systems are negative.  Review of Systems  Constitutional:  Negative for fatigue and fever.  HENT:  Negative for congestion.   Respiratory:  Negative for cough and shortness of breath.   Gastrointestinal:  Negative for abdominal pain, diarrhea, nausea and vomiting.  Musculoskeletal:  Positive for back pain.    Vital Signs: BP 127/88 (BP Location: Left Arm, Patient Position: Sitting, Cuff Size: Normal)   Pulse (!) 108   Temp 98 F (36.7 C) (Oral)   Resp 18   SpO2 98%   PF (!) 2 L/min Comment: 2LviaNC  Advance Care Plan: The advanced care plan/surrogate decision maker was discussed at the time of visit and the patient did not wish to discuss or was not able to name a surrogate decision maker or provide an advance care plan.    Physical Exam Vitals and nursing note reviewed.  Constitutional:      Appearance: She is well-developed.  HENT:     Head: Normocephalic and atraumatic.  Eyes:     Conjunctiva/sclera: Conjunctivae normal.  Cardiovascular:     Rate and Rhythm: Normal rate.  Pulmonary:     Comments: On o2 via nasal cannula. At baseline Musculoskeletal:        General: Tenderness (with palpation to mid back) present. Normal range of motion.     Cervical back: Normal range of motion.  Skin:    General: Skin is warm.  Neurological:     General: No focal deficit present.     Mental Status: She is alert and oriented to person, place, and time. Mental status is at baseline.  Psychiatric:        Mood and Affect: Mood normal.        Behavior: Behavior normal.        Thought Content: Thought content normal.        Judgment: Judgment normal.     Mallampati Score:     Imaging: US  Venous Img Lower Bilateral (DVT) Result Date: 02/21/2024 CLINICAL DATA:  Bilateral lower extremity edema EXAM: BILATERAL LOWER EXTREMITY VENOUS DOPPLER ULTRASOUND TECHNIQUE: Gray-scale sonography with graded compression, as well as color Doppler and duplex ultrasound were  performed to evaluate the lower extremity deep venous systems from the level of the common femoral vein and including the common femoral, femoral, profunda femoral, popliteal and calf veins including the posterior tibial, peroneal and gastrocnemius veins when visible. The superficial great saphenous vein was also interrogated. Spectral Doppler was utilized to evaluate flow at rest and with distal augmentation maneuvers in the common femoral, femoral and popliteal veins. COMPARISON:  None Available. FINDINGS: RIGHT LOWER EXTREMITY Common Femoral Vein: No evidence of thrombus. Normal compressibility, respiratory phasicity and response to augmentation. Saphenofemoral Junction: No evidence of thrombus. Normal compressibility and flow on color Doppler imaging. Profunda Femoral Vein: No evidence of thrombus. Normal compressibility and flow on color Doppler imaging. Femoral Vein: No evidence of thrombus. Normal compressibility, respiratory phasicity and response to augmentation. Popliteal Vein: No evidence of thrombus. Normal compressibility, respiratory phasicity and response to augmentation. Calf Veins: No evidence of thrombus. Normal compressibility and flow on color Doppler imaging. Superficial Great Saphenous Vein: No evidence of thrombus. Normal compressibility. Venous Reflux:  None. Other Findings:  None. LEFT LOWER EXTREMITY Common Femoral Vein: No evidence of thrombus. Normal compressibility, respiratory phasicity and response to augmentation. Saphenofemoral Junction: No evidence of thrombus. Normal compressibility and flow on color Doppler imaging. Profunda Femoral Vein: No evidence of thrombus. Normal compressibility and flow on color Doppler imaging. Femoral Vein: No evidence of thrombus. Normal compressibility, respiratory phasicity and response to augmentation. Popliteal Vein: No evidence of thrombus. Normal compressibility, respiratory phasicity and response to augmentation. Calf Veins: No evidence of  thrombus. Normal compressibility and flow on color Doppler imaging. Superficial Great Saphenous Vein: No evidence  of thrombus. Normal compressibility. Venous Reflux:  None. Other Findings:  None. IMPRESSION: No evidence of deep venous thrombosis in either lower extremity. Electronically Signed   By: Wilkie Lent M.D.   On: 02/21/2024 13:07   ECHOCARDIOGRAM COMPLETE Result Date: 02/17/2024    ECHOCARDIOGRAM REPORT   Patient Name:   CARALINE DEUTSCHMAN Date of Exam: 02/17/2024 Medical Rec #:  [id number]     Height:       62.0 in Accession #:    [id number]    Weight:       132.3 lb Date of Birth:  08-May-1945     BSA:          1.604 m Patient Age:    78 years      BP:           164/73 mmHg Patient Gender: F             HR:           100 bpm. Exam Location:  [facility] Procedure: 2D Echo, Cardiac Doppler and Color Doppler (Both Spectral and Color            Flow Doppler were utilized during procedure). Indications:     CHF--acute diastolic I50.31  History:         Patient has prior history of Echocardiogram examinations, most                  recent 03/20/2021.  Sonographer:     Christopher Furnace Referring Phys:  [id number] CHARLIE PATTERSON Diagnosing Phys: Evalene Lunger MD  Sonographer Comments: Technically challenging study due to limited acoustic windows, no parasternal window and no subcostal window. IMPRESSIONS  1. Left ventricular ejection fraction, by estimation, is 60 to 65%. The left ventricle has normal function. The left ventricle has no regional wall motion abnormalities. Left ventricular diastolic parameters are consistent with Grade I diastolic dysfunction (impaired relaxation).  2. Right ventricular systolic function is normal. The right ventricular size is normal. There is normal pulmonary artery systolic pressure. The estimated right ventricular systolic pressure is 21.6 mmHg.  3. The mitral valve is normal in structure. Mild mitral valve regurgitation. No evidence of mitral stenosis.  4. The aortic valve is normal in  structure. Aortic valve regurgitation is mild. No aortic stenosis is present.  5. The inferior vena cava is normal in size with greater than 50% respiratory variability, suggesting right atrial pressure of 3 mmHg. FINDINGS  Left Ventricle: Left ventricular ejection fraction, by estimation, is 60 to 65%. The left ventricle has normal function. The left ventricle has no regional wall motion abnormalities. Strain was performed and the global longitudinal strain is indeterminate. The left ventricular internal cavity size was normal in size. There is no left ventricular hypertrophy. Left ventricular diastolic parameters are consistent with Grade I diastolic dysfunction (impaired relaxation). Right Ventricle: The right ventricular size is normal. No increase in right ventricular wall thickness. Right ventricular systolic function is normal. There is normal pulmonary artery systolic pressure. The tricuspid regurgitant velocity is 2.04 m/s, and  with an assumed right atrial pressure of 5 mmHg, the estimated right ventricular systolic pressure is 21.6 mmHg. Left Atrium: Left atrial size was normal in size. Right Atrium: Right atrial size was normal in size. Pericardium: There is no evidence of pericardial effusion. Mitral Valve: The mitral valve is normal in structure. Mild mitral annular calcification. Mild mitral valve regurgitation. No evidence of mitral valve stenosis. MV peak gradient, 4.8 mmHg. The mean mitral valve  gradient is 2.0 mmHg. Tricuspid Valve: The tricuspid valve is normal in structure. Tricuspid valve regurgitation is not demonstrated. No evidence of tricuspid stenosis. Aortic Valve: The aortic valve is normal in structure. Aortic valve regurgitation is mild. No aortic stenosis is present. Aortic valve mean gradient measures 5.2 mmHg. Aortic valve peak gradient measures 9.4 mmHg. Aortic valve area, by VTI measures 2.16 cm. Pulmonic Valve: The pulmonic valve was normal in structure. Pulmonic valve  regurgitation is not visualized. No evidence of pulmonic stenosis. Aorta: The aortic root is normal in size and structure. Venous: The inferior vena cava is normal in size with greater than 50% respiratory variability, suggesting right atrial pressure of 3 mmHg. IAS/Shunts: No atrial level shunt detected by color flow Doppler. Additional Comments: 3D was performed not requiring image post processing on an independent workstation and was indeterminate.  LEFT VENTRICLE PLAX 2D LVIDd:         4.20 cm   Diastology LVIDs:         3.00 cm   LV e' medial:    5.44 cm/s LV PW:         0.90 cm   LV E/e' medial:  15.2 LV IVS:        1.20 cm   LV e' lateral:   6.74 cm/s LVOT diam:     2.00 cm   LV E/e' lateral: 12.3 LV SV:         60 LV SV Index:   37 LVOT Area:     3.14 cm LV IVRT:       124 msec  RIGHT VENTRICLE RV Basal diam:  3.60 cm  PULMONARY VEINS RV Mid diam:    2.80 cm  Diastolic Velocity: 24.90 cm/s                          S/D Velocity:       1.50                          Systolic Velocity:  36.40 cm/s LEFT ATRIUM             Index        RIGHT ATRIUM          Index LA diam:        3.60 cm 2.24 cm/m   RA Area:     8.08 cm LA Vol (A2C):   85.0 ml 53.00 ml/m  RA Volume:   11.90 ml 7.42 ml/m LA Vol (A4C):   41.7 ml 26.00 ml/m LA Biplane Vol: 63.9 ml 39.85 ml/m  AORTIC VALVE AV Area (Vmax):    2.08 cm AV Area (Vmean):   1.88 cm AV Area (VTI):     2.16 cm AV Vmax:           153.25 cm/s AV Vmean:          106.325 cm/s AV VTI:            0.276 m AV Peak Grad:      9.4 mmHg AV Mean Grad:      5.2 mmHg LVOT Vmax:         101.60 cm/s LVOT Vmean:        63.550 cm/s LVOT VTI:          0.190 m LVOT/AV VTI ratio: 0.69  AORTA Ao Root diam: 1.10 cm MITRAL VALVE  TRICUSPID VALVE MV Area (PHT): 5.27 cm     TR Peak grad:   16.6 mmHg MV Area VTI:   3.14 cm     TR Vmax:        204.00 cm/s MV Peak grad:  4.8 mmHg MV Mean grad:  2.0 mmHg     SHUNTS MV Vmax:       1.10 m/s     Systemic VTI:  0.19 m MV Vmean:       73.9 cm/s    Systemic Diam: 2.00 cm MV Decel Time: 144 msec MV E velocity: 82.70 cm/s MV A velocity: 112.00 cm/s MV E/A ratio:  0.74 Evalene Lunger MD Electronically signed by Evalene Lunger MD Signature Date/Time: 02/17/2024/2:47:56 PM    Final    DG Chest Port 1 View Result Date: 02/17/2024 EXAM: 1 VIEW(S) XRAY OF THE CHEST 02/17/2024 07:29:23 AM COMPARISON: 01/19/2022 CLINICAL HISTORY: Shortness of breath FINDINGS: LUNGS AND PLEURA: Apical lordotic positioning. Hyperinflation. Interstitial coarsening. Left base subsegmental atelectasis and/or scar, similar. No pleural effusion. No pneumothorax. HEART AND MEDIASTINUM: Moderate cardiomegaly. Transverse aortic atherosclerosis. Right paratracheal soft tissue fullness is secondary to tortuous great vessels when compared to the prior CT of 03/18/2021. BONES AND SOFT TISSUES: Vertebral augmentation at multiple thoracic levels, suboptimally evaluated. Right shoulder osteoarthritis. Remote right rib fractures. No acute osseous abnormality. IMPRESSION: 1. No acute explanation for shortness of breath. 2. Hyperinflation and interstitial thickening are likely related to smoking/COPD. 3. Aortic atherosclerosis (ICD-10 I70.0). Electronically signed by: Rockey Kilts MD 02/17/2024 11:36 AM EST RP Workstation: [id number]   MR THORACIC SPINE WO CONTRAST Result Date: 02/16/2024 EXAM: MRI THORACIC SPINE WITHOUT INTRAVENOUS CONTRAST 02/16/2024 02:05:51 PM TECHNIQUE: Multiplanar multisequence MRI of the thoracic spine was performed without the administration of intravenous contrast. COMPARISON: MR Thoracic Spine 09/16/2019. CTA chest 03/18/2021. CLINICAL HISTORY: Mid-back pain, prior compression fracture. FINDINGS: LIMITATIONS: The examination is motion degraded, intermittently severely so on axial sequences. BONES AND ALIGNMENT: Exaggerated thoracic kyphosis. No significant listhesis. Compression fractures at every thoracic level as well as partially visualized in the lumbar spine  with sparing of L3. Previous vertebral augmentation at T5, T6, and T11 to L2. Prominent marrow edema in the T10 vertebral body centered on the superior endplate consistent with an acute on chronic fracture with approximately 35% vertebral body height loss. Slight bulging of the anterior T10 vertebral body cortex and minimal retropulsion. No definite underlying bone lesion. SPINAL CORD: Normal spinal cord volume. Normal spinal cord signal. SOFT TISSUES: Mild paravertebral soft tissue edema and/or fluid at T10. Trace bilateral pleural effusions. DEGENERATIVE CHANGES: Mild retropulsion associated with most thoracic compression fractures without compressive spinal stenosis. IMPRESSION: 1. Acute on chronic T10 compression fracture with 35% vertebral body height loss. 2. 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 Electronically signed by: Dasie Hamburg MD 02/16/2024 04:11 PM EST RP Workstation: [id number]   CT ABDOMEN PELVIS WO CONTRAST Result Date: 02/16/2024 EXAM: CT ABDOMEN AND PELVIS WITHOUT CONTRAST 02/16/2024 12:27:40 AM TECHNIQUE: CT of the abdomen and pelvis was performed without the administration of intravenous contrast. Multiplanar reformatted images are provided for review. Automated exposure control, iterative reconstruction, and/or weight-based adjustment of the mA/kV was utilized to reduce the radiation dose to as low as reasonably achievable. COMPARISON: Comparison with 08/06/2017. CLINICAL HISTORY: Bowel obstruction suspected. FINDINGS: LOWER CHEST: No acute abnormality. LIVER: The liver is unremarkable. GALLBLADDER AND BILE DUCTS: Gallbladder is unremarkable. No biliary ductal dilatation. SPLEEN: No acute abnormality. PANCREAS: No acute abnormality. ADRENAL GLANDS: No acute  abnormality. KIDNEYS, URETERS AND BLADDER: Scarring in the left kidney. No stones in the kidneys or ureters. No hydronephrosis. No perinephric or periureteral stranding. Nondistended bladder. GI AND  BOWEL: Stomach demonstrates no acute abnormality. There is no bowel obstruction. Normal appendix. PERITONEUM AND RETROPERITONEUM: No ascites. No free air. VASCULATURE: Aorta is normal in caliber. Aortic atherosclerotic calcification. LYMPH NODES: No lymphadenopathy. REPRODUCTIVE ORGANS: No acute abnormality. BONES AND SOFT TISSUES: Chronic compression fractures and vertebroplasties of T11, T12, L1, L2, and L4. Presumed chronic mild superior endplate compression fracture of L5. No focal soft tissue abnormality. IMPRESSION: 1. No acute findings in the abdomen or pelvis. Electronically signed by: Norman Gatlin MD 02/16/2024 12:38 AM EST RP Workstation: [id number]   DG Hand 2 View Right Result Date: 02/15/2024 EXAM: 1 or 2 VIEW(S) XRAY OF THE RIGHT HAND 02/15/2024 11:33:00 PM COMPARISON: None available. CLINICAL HISTORY: Abscess of right hand FINDINGS: BONES AND JOINTS: Subchondral cyst in the distal radius. Fixed flexion of the 2nd through 5th MCP joints. Radiocarpal joint space narrowing. Widening of the scapholunate interval in keeping with disruption of the scapholunate ligament. Erosions noted within the lunate, scaphoid, distal radius and base of the fourth metacarpal. No acute fracture. No joint dislocation. SOFT TISSUES: Dorsal soft tissue swelling. IMPRESSION: 1. No acute osseous abnormality related to the abscess of the right hand. 2. Dorsal soft tissue swelling. Electronically signed by: Dorethia Molt MD 02/15/2024 11:41 PM EST RP Workstation: [id number]    Labs:  CBC: Recent Labs    02/21/24 0905 02/22/24 0754 02/23/24 0538 02/24/24 0543  WBC 13.4* 13.7* 14.7* 12.7*  HGB 11.1* 10.3* 10.0* 10.2*  HCT 35.0* 32.2* 31.5* 32.3*  PLT 422* 387 360 317    COAGS: Recent Labs    02/15/24 2323  INR 1.0  APTT 33    BMP: Recent Labs    02/19/24 0444 02/21/24 0905 02/23/24 0707 02/24/24 0543  NA 136 134* 136 134*  K 3.7 5.0 4.2 4.3  CL 93* 89* 91* 90*  CO2 35* 38* 38* 36*  GLUCOSE  178* 97 152* 90  BUN 36* 38* 39* 45*  CALCIUM  9.1 9.6 9.3 9.5  CREATININE 0.71 0.79 0.80 0.81  GFRNONAA >60 >60 >60 >60    LIVER FUNCTION TESTS: Recent Labs    02/15/24 1815  BILITOT 0.4  AST 22  ALT 32  ALKPHOS 83  PROT 7.0  ALBUMIN 4.0    TUMOR MARKERS: No results for input(s): AFPTM, CEA, CA199, CHROMGRNA in the last 8760 hours.   Assessment & Plan:   Patient has suffered acute osteoporotic fracture of the T10 vertebra.   History and exam have demonstrated the following:  Acute/Subacute fracture by imaging dated 2024/02/28.2.25   ICD-10-CM Codes that Support Medical Necessity (welshblog.at.aspx?articleId=[email or web address])  M80.08XA    Age-related osteoporosis with current pathological fracture, vertebra(e), initial encounter for fracture   Plan:  T10 vertebral body augmentation with balloon Kyphoplasty  Post-procedure disposition: outpatient [facility]. Any IR Attending to do.  Medication holds: none  The patient has suffered a fracture of the T10 vertebral body. Diagnosis of osteoporosis. A copy of this consult report is sent to the patient's referring physician.  Advanced Care Plan: The patient did not want to provide an Advanced Care Plan at the time of this visit     Total time spent on today's visit was over  30 Minutes , including both face-to-face time and non face-to-face time, personally spent on review of chart (including labs and relevant imaging), discussing further workup and treatment  options, referral to specialist if needed, reviewing outside records if pertinent, answering patient questions, and coordinating care regarding T10 compression fracture. as well as management strategy.    Thank you for this interesting consult.  I greatly enjoyed meeting [facility] and look forward to participating in their care.  A copy of this report was sent to the requesting provider on this date.  Electronically Signed: Delon JAYSON Beagle 03/07/2024, 11:41 AM   I spent a total of 40 Minutes    in face to face in clinical consultation, greater than 50% of which was counseling/coordinating care for T10 kypohoplasty   I have reviewed the APP's note and I agree with the findings and the plan as described.  The patient will be scheduled with the next available provider for a T10 kyphoplasty with moderate sedation, preferably at [facility].  "

## 2024-03-10 ENCOUNTER — Other Ambulatory Visit: Payer: Self-pay

## 2024-03-10 DIAGNOSIS — M8008XA Age-related osteoporosis with current pathological fracture, vertebra(e), initial encounter for fracture: Secondary | ICD-10-CM

## 2024-03-10 DIAGNOSIS — S22000A Wedge compression fracture of unspecified thoracic vertebra, initial encounter for closed fracture: Secondary | ICD-10-CM

## 2024-03-12 NOTE — Progress Notes (Signed)
 "     Chief Complaint: Patient was seen in consultation today for T10 compression fracture.   Referring Physician(s): James L. Kip, PA-C  Patient Status: [name] - Outpatient  History of Present Illness: Ashley Mack Mack is an 78 y.o. female with a medical history significant for blindness, HTN, COPD on home oxygen, heart failure, anemia, anxiety, migraines, RA on chronic prednisone , chronic leukocytosis, osteoporosis and back pain with multiple prior compression fractures.  L1, L2, L4, T5, T11 and T12 levels have previously been treated with kyphoplasty/vertebroplasty. All procedures performed by [facility].   She presented to the ED 02/15/24 for evaluation of right hand swelling, redness, pain, nausea and vomiting. Work up in the ED was notable for leukocytosis, tachycardia and a right hand dorsal abscess. She was admitted with sepsis and underwent I&D of the abscess x 2. Cultures were positive for Staph aureus and she was treated with antibiotics. The patient also complained of back pain and reported a recent fall. An MRI was obtained and this showed an acute on chronic T10 compression fracture with 35% vertebral body height loss.   Interventional Radiology was consulted for kyphoplasty but due to leukocytosis our team recommended outpatient follow up. She was discharged from the hospital 02/24/24 and met with Dr. Jenna 03/07/24. She reported persistent back pain that was worsening and was aggravated by most activities. She had tried a variety of OTC and prescription medications without relief. She rated her pain a 7/10 and scored a 23/24 on the Roland-Morris Disability Questionnaire.   Dr. Jenna reviewed the details, risks, benefits and alternatives for T10 kyphoplasty. She was agreeable to proceed.   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    Iron  (Fe) deficiency anemia    Migraine headache    in past   Migraines    Motion sickness    No natural teeth    Osteoporosis    Rheumatoid arthritis (HCC)    Vertigo     Past Surgical History:  Procedure Laterality Date   CATARACT EXTRACTION W/PHACO Right 08/12/2016   Procedure: CATARACT EXTRACTION PHACO AND INTRAOCULAR LENS PLACEMENT (IOC)  Right;  Surgeon: Ashley Gaskin, MD;  Location: [facility];  Service: Ophthalmology;  Laterality: Right;   CATARACT EXTRACTION W/PHACO Left 09/30/2016   Procedure: CATARACT EXTRACTION PHACO AND INTRAOCULAR LENS PLACEMENT (IOC) Left;  Surgeon: Ashley Gaskin, MD;  Location: [facility];  Service: Ophthalmology;  Laterality: Left;   COLONOSCOPY     COLONOSCOPY WITH PROPOFOL  N/A 12/09/2016   Procedure: COLONOSCOPY WITH PROPOFOL ;  Surgeon: Toledo, Ashley POUR, MD;  Location: [facility];  Service: Endoscopy;  Laterality: N/A;   ESOPHAGOGASTRODUODENOSCOPY     ESOPHAGOGASTRODUODENOSCOPY (EGD) WITH PROPOFOL  N/A 12/09/2016   Procedure: ESOPHAGOGASTRODUODENOSCOPY (EGD) WITH PROPOFOL ;  Surgeon: Toledo, Ashley POUR, MD;  Location: [facility] END[facility];  Service: Endoscopy;  Laterality: N/A;   KYPHOPLASTY N/A 08/10/2017   Procedure: LYNN;  Surgeon: Ashley Mack Sharper, MD;  Location: [facility] OR[facility];  Service: Orthopedics;  Laterality: N/A;   KYPHOPLASTY N/A 09/09/2017   Procedure: Ashley Mack Mack;  Surgeon: Ashley Mack Sharper, MD;  Location: [facility] OR[facility];  Service: Orthopedics;  Laterality: N/A;   KYPHOPLASTY N/A 09/28/2017   Procedure: Ashley Mack Mack;  Surgeon: Ashley Mack Sharper, MD;  Location: [facility] OR[facility];  Service: Orthopedics;  Laterality: N/A;   KYPHOPLASTY N/A 06/19/2019   Procedure: KYPHOPLASTY T5;  Surgeon: Ashley Mack Sharper, MD;  Location: [facility]  ORS;  Service: Orthopedics;  Laterality: N/A;   OOPHORECTOMY Bilateral 2003   RIGHT/LEFT HEART CATH AND CORONARY ANGIOGRAPHY Bilateral 10/19/2019   Procedure: RIGHT/LEFT HEART CATH AND CORONARY ANGIOGRAPHY;  Surgeon: Ashley Mack Cara BIRCH, MD;  Location: [facility];  Service: Cardiovascular;  Laterality: Bilateral;    Allergies: Methotrexate and trimetrexate, Other, Gabapentin, Prochlorperazine , Ranitidine, Sulfasalazine, and Topiramate  Medications: Prior to Admission medications  Medication Sig Start Date End Date Taking? Authorizing Provider  acetaminophen  (TYLENOL ) 325 MG tablet Take 2 tablets (650 mg total) by mouth every 6 (six) hours as needed for mild pain (pain score 1-3), fever or moderate pain (pain score 4-6). 02/24/24   Alexander, Natalie, DO  albuterol  (VENTOLIN  HFA) 108 (90 Base) MCG/ACT inhaler Inhale 1-2 puffs into the lungs every 4 (four) hours as needed for wheezing or shortness of breath. 06/01/23   Mortenson, Ashley, MD  budesonide -formoterol  (SYMBICORT) 160-4.5 MCG/ACT inhaler Inhale 2 puffs into the lungs 2 (two) times daily.    [provider]  Cholecalciferol  (VITAMIN D3) 2000 UNITS capsule Take 2,000 Units by mouth daily.     [provider]  cyanocobalamin  1000 MCG tablet Take 1 tablet (1,000 mcg total) by mouth daily. 02/24/24   Alexander, Natalie, DO  cyclobenzaprine  (FLEXERIL ) 5 MG tablet Take 0.5-1 tablets (2.5-5 mg total) by mouth 3 (three) times daily as needed for muscle spasms. 02/24/24   Alexander, Natalie, DO  dexlansoprazole (DEXILANT) 60 MG capsule Take 60 mg by mouth daily. Patient not taking: Reported on 02/16/2024    [provider]  diclofenac  Sodium (VOLTAREN ) 1 % GEL Apply 2 g topically as needed.    [provider]  Fe Fum-FePoly-Vit C-Vit B3 (INTEGRA) 62.5-62.5-40-3 MG CAPS Take 1 capsule by mouth daily. 11/29/13   [provider]  Fluocinolone Acetonide 0.01 % OIL Place 2-4 drops into both ears at  bedtime as needed (itching). Max usage 10 days per month    [provider]  fluticasone  (FLONASE ) 50 MCG/ACT nasal spray Place into both nostrils daily.    [provider]  furosemide  (LASIX ) 40 MG tablet Take 1 tablet (40 mg total) by mouth daily. 02/25/24   Alexander, Natalie, DO  magnesium  oxide (MAG-OX) 400 MG tablet Take 400 mg by mouth daily.    [provider]  ondansetron  (ZOFRAN -ODT) 4 MG disintegrating tablet Take 1 tablet (4 mg total) by mouth every 6 (six) hours as needed for nausea or vomiting. OK to take prior to meals / prior to taking medications 02/24/24   Alexander, Natalie, DO  predniSONE  (DELTASONE ) 5 MG tablet Take 5 mg by mouth daily.     [provider]  Spacer/Aero-Holding Chambers (AEROCHAMBER MV) inhaler Use as instructed 06/01/23   Van Knee, MD  traZODone  (DESYREL ) 50 MG tablet Take 50-100 mg by mouth. 11/18/23 11/17/24  [provider]  Turmeric (QC TUMERIC COMPLEX) 500 MG CAPS Take 1 tablet by mouth daily.    [provider]     Family History  Problem Relation Age of Onset   Kidney disease Mother    Diabetes Mellitus II Brother     Social History   Socioeconomic History   Marital status: Widowed  Spouse name: Not on file   Number of children: Not on file   Years of education: Not on file   Highest education level: Not on file  Occupational History   Not on file  Tobacco Use   Smoking status: Former    Current packs/day: 0.00    Average packs/day: 1.0 packs/day    Types: Cigarettes    Quit date: 2009    Years since quitting: 17.0   Smokeless tobacco: Never  Vaping Use   Vaping status: Never Used  Substance and Sexual Activity   Alcohol use: No   Drug use: No   Sexual activity: Not Currently  Other Topics Concern   Not on file  Social History Narrative   Not on file   Social Drivers of Health   Tobacco Use: Medium Risk (02/29/2024)   Received from [facility]    Patient History    Smoking Tobacco Use: Former    Smokeless Tobacco Use: Never    Passive Exposure: Not on file  Financial Resource Strain: Low Risk  (11/18/2023)   Received from [facility]   Overall Financial Resource Strain (CARDIA)    Difficulty of Paying Living Expenses: Not hard at all  Food Insecurity: No Food Insecurity (02/16/2024)   Epic    Worried About Radiation Protection Practitioner of Food in the Last Year: Never true    Ran Out of Food in the Last Year: Never true  Transportation Needs: No Transportation Needs (02/16/2024)   Epic    Lack of Transportation (Medical): No    Lack of Transportation (Non-Medical): No  Physical Activity: Not on file  Stress: Not on file  Social Connections: Moderately Isolated (02/16/2024)   Social Connection and Isolation Panel    Frequency of Communication with Friends and Family: More than three times a week    Frequency of Social Gatherings with Friends and Family: More than three times a week    Attends Religious Services: 1 to 4 times per year    Active Member of [employer] or Organizations: No    Attends Banker Meetings: Never    Marital Status: Widowed  Depression (PHQ2-9): Not on file  Alcohol Screen: Not on file  Housing: Low Risk (02/16/2024)   Epic    Unable to Pay for Housing in the Last Year: No    Number of Times Moved in the Last Year: 0    Homeless in the Last Year: No  Utilities: Not At Risk (02/16/2024)   Epic    Threatened with loss of utilities: No  Health Literacy: Not on file    Review of Systems: A 12 point ROS discussed and pertinent positives are indicated in the HPI above.  All other systems are negative.  Review of Systems  Vital Signs: There were no vitals taken for this visit.  Physical Exam  Imaging:  MR Thoracic Spine 02/16/24    IMPRESSION: 1. Acute on chronic T10 compression fracture with 35% vertebral body height loss. 2. Chronic compression fractures throughout the remainder of the  thoracic spine.  Labs:  CBC: Recent Labs    02/21/24 0905 02/22/24 0754 02/23/24 0538 02/24/24 0543  WBC 13.4* 13.7* 14.7* 12.7*  HGB 11.1* 10.3* 10.0* 10.2*  HCT 35.0* 32.2* 31.5* 32.3*  PLT 422* 387 360 317    COAGS: Recent Labs    02/15/24 2323  INR 1.0  APTT 33    BMP: Recent Labs    02/19/24 0444 02/21/24 0905 02/23/24  9292 02/24/24 0543  NA 136 134* 136 134*  K 3.7 5.0 4.2 4.3  CL 93* 89* 91* 90*  CO2 35* 38* 38* 36*  GLUCOSE 178* 97 152* 90  BUN 36* 38* 39* 45*  CALCIUM  9.1 9.6 9.3 9.5  CREATININE 0.71 0.79 0.80 0.81  GFRNONAA >60 >60 >60 >60    LIVER FUNCTION TESTS: Recent Labs    02/15/24 1815  BILITOT 0.4  AST 22  ALT 32  ALKPHOS 83  PROT 7.0  ALBUMIN 4.0    TUMOR MARKERS: No results for input(s): AFPTM, CEA, CA199, CHROMGRNA in the last 8760 hours.  Assessment and Plan:  T10 compression fracture: Ashley Mack Mack, 78 year old female, presents today for an image-guided T10 kyphoplasty/vertebroplasty.   Risks and benefits of T10 kyphoplasty/vertebroplasty were discussed with the patient including, but not limited to education regarding the natural healing process of compression fractures without intervention, bleeding, infection, cement migration which may cause spinal cord damage, paralysis, pulmonary embolism or even death.  All of the patient's questions were answered, patient is agreeable to proceed. She has been NPO. She does not take any blood-thinning medications.   Consent signed and in chart.  Thank you for this interesting consult.  I greatly enjoyed meeting Ashley Mack Mack and look forward to participating in their care.  A copy of this report was sent to the requesting provider on this date.  Ester Sides, MD Pager: [phone]    I spent a total of  30 Minutes   in face to face in clinical consultation, greater than 50% of which was counseling/coordinating care for T10 compression fracture.   "

## 2024-03-13 ENCOUNTER — Ambulatory Visit
Admission: RE | Admit: 2024-03-13 | Discharge: 2024-03-13 | Disposition: A | Discharge status: Home / Self Care | Source: Ambulatory Visit

## 2024-03-13 DIAGNOSIS — M8008XA Age-related osteoporosis with current pathological fracture, vertebra(e), initial encounter for fracture: Secondary | ICD-10-CM

## 2024-03-13 DIAGNOSIS — S22000A Wedge compression fracture of unspecified thoracic vertebra, initial encounter for closed fracture: Secondary | ICD-10-CM

## 2024-03-13 HISTORY — PX: IR KYPHO THORACIC WITH BONE BIOPSY: IMG5518

## 2024-03-13 MED ORDER — CEFAZOLIN SODIUM-DEXTROSE 2-4 GM/100ML-% IV SOLN
2.0000 g | Freq: Once | INTRAVENOUS | Status: AC
  Administered 2024-03-13: 2 g via INTRAVENOUS

## 2024-03-13 MED ORDER — MIDAZOLAM HCL (PF) 2 MG/2ML IJ SOLN
INTRAMUSCULAR | Status: AC | PRN
  Administered 2024-03-13 (×2): 1 mg via INTRAVENOUS

## 2024-03-13 MED ORDER — KETOROLAC TROMETHAMINE 30 MG/ML IJ SOLN
30.0000 mg | Freq: Once | INTRAMUSCULAR | Status: AC
  Administered 2024-03-13: 30 mg via INTRAVENOUS

## 2024-03-13 MED ORDER — SODIUM CHLORIDE 0.9 % IV SOLN: INTRAVENOUS | Status: DC

## 2024-03-13 MED ORDER — FENTANYL CITRATE (PF) 50 MCG/ML IJ SOSY: 25.0000 ug | PREFILLED_SYRINGE | INTRAMUSCULAR | Status: DC | PRN

## 2024-03-13 MED ORDER — MIDAZOLAM HCL (PF) 2 MG/2ML IJ SOLN: 1.0000 mg | INTRAMUSCULAR | Status: DC | PRN

## 2024-03-13 MED ORDER — LIDOCAINE HCL (PF) 1 % IJ SOLN
20.0000 mL | Freq: Once | INTRAMUSCULAR | Status: AC
  Administered 2024-03-13: 20 mL via INTRADERMAL

## 2024-03-13 MED ORDER — ACETAMINOPHEN 10 MG/ML IV SOLN
1000.0000 mg | Freq: Once | INTRAVENOUS | Status: AC
  Administered 2024-03-13: 1000 mg via INTRAVENOUS

## 2024-03-13 MED ORDER — ONDANSETRON HCL 40 MG/20ML IJ SOLN
8.0000 mg | Freq: Once | INTRAMUSCULAR | Status: AC
  Administered 2024-03-13: 8 mg via INTRAVENOUS

## 2024-03-13 MED ORDER — FENTANYL CITRATE (PF) 100 MCG/2ML IJ SOLN
INTRAMUSCULAR | Status: AC | PRN
  Administered 2024-03-13 (×3): 50 ug via INTRAVENOUS

## 2024-03-13 NOTE — Progress Notes (Signed)
 Pt became nauseous.  Stretcher placed in reverse trendelenburg and patient slightly tilted to side.  Minimal amount of emesis in basin.

## 2024-03-13 NOTE — Discharge Instructions (Signed)
 Discharge Instructions for Kyphoplasty/Sacroplasty  You may resume a regular diet and any medications that you routinely take (including pain medications). No driving the day of the procedure. Upon discharge go home and rest. Slowly increase your activities as tolerated. Do not lift anything heavier than a jug of milk (roughly eight pounds) for two weeks. You may use an ice pack as needed to the injection sites. You may change the dressing to bandaids tomorrow.  Change the bandaids daily until sites have healed. Follow up with your doctor in 2-3 weeks to let him know how you are feeling. Discuss with your doctor if bone-building therapy is appropriate for you, if you are not on this type of medication already.    Please contact our office at [phone] for the following symptoms or any questions:  Fever greater than 100 degrees Increased swelling, pain, or redness at injection site.   If you need to speak with someone after business hours, please call the Interventional Radiologist on-call service at [phone]. Tell them you are Dr. Terrill patient and that you had a Kyphoplasty/Sacroplasty today, and let them know any issues you are having.   Thank you for visiting [facility]!

## 2024-03-14 ENCOUNTER — Telehealth: Payer: Self-pay

## 2024-03-14 MED ORDER — OXYCODONE-ACETAMINOPHEN 5-325 MG PO TABS: 1.0000 | ORAL_TABLET | Freq: Four times a day (QID) | ORAL | 0 refills | Status: AC | PRN

## 2024-03-14 NOTE — Telephone Encounter (Signed)
 Patient called requesting pain medication for continued 7/10 back pain after recent KP procedure. Given timeline for maximum pain improvement after this procedure can take days to weeks, reasonable to provide short term script for as needed percocet for breakthrough pain. Will send to preferred pharm via escript.

## 2024-03-14 NOTE — Telephone Encounter (Signed)
 See telephone note  Spoke with Dr. Jennefer through [facility], ok for patient to have some oxy's ordered, Brittany, NP at [facility] will prescribed medication  This RN called pt back and informed of med to be prescribed and to ice back 20 minutes on and off, with ice wrapped in towel/cloth, also make sure not to double dose on tylenol  amount since percocet has tylenol  in it, make sure you are having BM's r/t narcotics, verbal acknowledgement noted from patient

## 2024-03-15 ENCOUNTER — Telehealth: Payer: Self-pay

## 2024-03-15 NOTE — Telephone Encounter (Signed)
 See telephone note  Pt had not taken pain med called in on 03-14-24, stated she was afraid to take it, informed  to try only 1/2 pill if still having pain and eat some food with the pill to prevent feeling sick

## 2024-03-21 NOTE — Telephone Encounter (Signed)
 SABRA

## 2024-03-22 ENCOUNTER — Other Ambulatory Visit: Payer: Self-pay | Admitting: Interventional Radiology

## 2024-03-22 DIAGNOSIS — S22000A Wedge compression fracture of unspecified thoracic vertebra, initial encounter for closed fracture: Secondary | ICD-10-CM

## 2024-03-30 ENCOUNTER — Other Ambulatory Visit: Payer: Self-pay | Admitting: Interventional Radiology

## 2024-03-30 ENCOUNTER — Other Ambulatory Visit: Payer: Self-pay | Admitting: Radiology

## 2024-03-30 ENCOUNTER — Ambulatory Visit
Admission: RE | Admit: 2024-03-30 | Discharge: 2024-03-30 | Disposition: A | Source: Ambulatory Visit | Attending: Interventional Radiology | Admitting: Interventional Radiology

## 2024-03-30 DIAGNOSIS — S32000A Wedge compression fracture of unspecified lumbar vertebra, initial encounter for closed fracture: Secondary | ICD-10-CM

## 2024-03-30 DIAGNOSIS — S22000A Wedge compression fracture of unspecified thoracic vertebra, initial encounter for closed fracture: Secondary | ICD-10-CM

## 2024-03-30 HISTORY — PX: IR RADIOLOGIST EVAL & MGMT: IMG5224

## 2024-03-30 MED ORDER — DIAZEPAM 5 MG PO TABS: 5.0000 mg | ORAL_TABLET | Freq: Once | ORAL | 0 refills | Status: AC

## 2024-03-30 NOTE — Progress Notes (Signed)
 "      Chief Complaint: Patient was seen in consultation today for age-related osteoporotic compression fractures, subsequent encounter at the request of Tawonna Esquer K  Referring Physician(s): Talulah Schirmer K  History of Present Illness: Ashley Mack is a 79 y.o. female with a medical history significant for blindness, HTN, COPD on home oxygen, heart failure, anemia, anxiety, migraines, RA on chronic prednisone , chronic leukocytosis, osteoporosis and back pain with multiple prior compression fractures.  L1, L2, L4, T5, T11 and T12 levels have previously been treated with kyphoplasty/vertebroplasty. All procedures performed by [facility].    She presented to the ED 02/15/24 for evaluation of right hand swelling, redness, pain, nausea and vomiting. Work up in the ED was notable for leukocytosis, tachycardia and a right hand dorsal abscess. She was admitted with sepsis and underwent I&D of the abscess x 2. Cultures were positive for Staph aureus and she was treated with antibiotics. The patient also complained of back pain and reported a recent fall. An MRI was obtained and this showed an acute on chronic T10 compression fracture with 35% vertebral body height loss.    Interventional Radiology was consulted for kyphoplasty but due to leukocytosis our team recommended outpatient follow up. She was discharged from the hospital 02/24/24 and met with Dr. Jenna 03/07/24. She reported persistent back pain that was worsening and was aggravated by most activities. She had tried a variety of OTC and prescription medications without relief. She rated her pain a 7/10 and scored a 23/24 on the Roland-Morris Disability Questionnaire.    She then underwent T10 kyphoplasty with Dr. Jennefer on 03/13/24.  She followed up with me today and comes in with her daughter.  Unfortunately, he pain remains unabated.  She rates it a 10/10 today and Roland Morris Disability score remains very high at 21/24.   He pain is in her lower thoracic and lumbar spine and radiates around her ribs worse on the right than the left.  She tells me that she cannot tolerate tramadol , or any narcotic based pain medication.  She is using a heating pad and Voltaren  cream currently with minimal effect.    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    Iron  (Fe) deficiency anemia    Migraine headache    in past   Migraines    Motion sickness    No natural teeth    Osteoporosis    Rheumatoid arthritis (HCC)    Vertigo     Past Surgical History:  Procedure Laterality Date   CATARACT EXTRACTION W/PHACO Right 08/12/2016   Procedure: CATARACT EXTRACTION PHACO AND INTRAOCULAR LENS PLACEMENT (IOC)  Right;  Surgeon: Mittie Gaskin, MD;  Location: [facility];  Service: Ophthalmology;  Laterality: Right;   CATARACT EXTRACTION W/PHACO Left 09/30/2016   Procedure: CATARACT EXTRACTION PHACO AND INTRAOCULAR LENS PLACEMENT (IOC) Left;  Surgeon: Mittie Gaskin, MD;  Location: [facility];  Service: Ophthalmology;  Laterality: Left;   COLONOSCOPY     COLONOSCOPY WITH PROPOFOL  N/A 12/09/2016   Procedure: COLONOSCOPY WITH PROPOFOL ;  Surgeon: Aundria, Teodoro K, MD;  Location: [facility];  Service: Endoscopy;  Laterality: N/A;   ESOPHAGOGASTRODUODENOSCOPY     ESOPHAGOGASTRODUODENOSCOPY (EGD) WITH PROPOFOL  N/A 12/09/2016   Procedure: ESOPHAGOGASTRODUODENOSCOPY (EGD) WITH PROPOFOL ;  Surgeon: Toledo, Ladell POUR, MD;  Location: [facility] END[facility];  Service: Endoscopy;  Laterality: N/A;   IR KYPHO THORACIC WITH BONE BIOPSY  03/13/2024   KYPHOPLASTY N/A 08/10/2017   Procedure: LYNN;   Surgeon: Kathlynn Sharper, MD;  Location: [facility] OR[facility];  Service: Orthopedics;  Laterality: N/A;   KYPHOPLASTY N/A 09/09/2017   Procedure: XBEYNEOJDUB-O8,O5;  Surgeon: Kathlynn Sharper, MD;  Location: [facility];  Service: Orthopedics;  Laterality: N/A;   KYPHOPLASTY N/A 09/28/2017   Procedure: XBEYNEOJDUB-U88;  Surgeon: Kathlynn Sharper, MD;  Location: [facility];  Service: Orthopedics;  Laterality: N/A;   KYPHOPLASTY N/A 06/19/2019   Procedure: KYPHOPLASTY T5;  Surgeon: Kathlynn Sharper, MD;  Location: [facility];  Service: Orthopedics;  Laterality: N/A;   OOPHORECTOMY Bilateral 2003   RIGHT/LEFT HEART CATH AND CORONARY ANGIOGRAPHY Bilateral 10/19/2019   Procedure: RIGHT/LEFT HEART CATH AND CORONARY ANGIOGRAPHY;  Surgeon: Florencio Cara BIRCH, MD;  Location: [facility];  Service: Cardiovascular;  Laterality: Bilateral;    Allergies: Methotrexate and trimetrexate, Other, Gabapentin, Prochlorperazine , Ranitidine, Sulfasalazine, and Topiramate  Medications: Prior to Admission medications  Medication Sig Start Date End Date Taking? Authorizing Provider  albuterol  (VENTOLIN  HFA) 108 (90 Base) MCG/ACT inhaler Inhale 1-2 puffs into the lungs every 4 (four) hours as needed for wheezing or shortness of breath. 06/01/23  Yes Van Knee, MD  budesonide -formoterol  (SYMBICORT) 160-4.5 MCG/ACT inhaler Inhale 2 puffs into the lungs 2 (two) times daily.   Yes [provider]  Cholecalciferol  (VITAMIN D3) 2000 UNITS capsule Take 2,000 Units by mouth daily.    Yes [provider]  cyanocobalamin  1000 MCG tablet Take 1 tablet (1,000 mcg total) by mouth daily. 02/24/24  Yes Alexander, Natalie, DO  cyclobenzaprine  (FLEXERIL ) 5 MG tablet Take 0.5-1 tablets (2.5-5 mg total) by mouth 3 (three) times daily as needed for muscle spasms. 02/24/24  Yes Alexander, Natalie, DO  dexlansoprazole (DEXILANT) 60 MG capsule Take 60 mg by mouth daily.   Yes [provider]  diclofenac  Sodium (VOLTAREN ) 1 % GEL  Apply 2 g topically as needed.   Yes [provider]  Fe Fum-FePoly-Vit C-Vit B3 (INTEGRA) 62.5-62.5-40-3 MG CAPS Take 1 capsule by mouth daily. 11/29/13  Yes [provider]  Fluocinolone Acetonide 0.01 % OIL Place 2-4 drops into both ears at bedtime as needed (itching). Max usage 10 days per month   Yes [provider]  fluticasone  (FLONASE ) 50 MCG/ACT nasal spray Place into both nostrils daily.   Yes [provider]  furosemide  (LASIX ) 40 MG tablet Take 1 tablet (40 mg total) by mouth daily. 02/25/24  Yes Alexander, Natalie, DO  magnesium  oxide (MAG-OX) 400 MG tablet Take 400 mg by mouth daily.   Yes [provider]  ondansetron  (ZOFRAN -ODT) 4 MG disintegrating tablet Take 1 tablet (4 mg total) by mouth every 6 (six) hours as needed for nausea or vomiting. OK to take prior to meals / prior to taking medications 02/24/24  Yes Alexander, Natalie, DO  predniSONE  (DELTASONE ) 5 MG tablet Take 5 mg by mouth daily.    Yes [provider]  Spacer/Aero-Holding Chambers (AEROCHAMBER MV) inhaler Use as instructed 06/01/23  Yes Van Knee, MD  traZODone  (DESYREL ) 50 MG tablet Take 50-100 mg by mouth. 11/18/23 11/17/24 Yes [provider]  Turmeric (QC TUMERIC COMPLEX) 500 MG CAPS Take 1 tablet by  mouth daily.   Yes [provider]  acetaminophen  (TYLENOL ) 325 MG tablet Take 2 tablets (650 mg total) by mouth every 6 (six) hours as needed for mild pain (pain score 1-3), fever or moderate pain (pain score 4-6). Patient not taking: Reported on 03/30/2024 02/24/24   Alexander, Natalie, DO  oxyCODONE -acetaminophen  (PERCOCET) 5-325 MG tablet Take 1 tablet by mouth every 6 (six) hours as needed for up to 10 doses for severe pain (pain score 7-10) (Take 1/2 a tab up to a whole tab once every 6 hours as needed for breakthrough pain after kyphoplasty.). Patient not taking: Reported on 03/30/2024 03/14/24   Neita Clotilda LABOR PA-C     Family  History  Problem Relation Age of Onset   Kidney disease Mother    Diabetes Mellitus II Brother     Social History   Socioeconomic History   Marital status: Widowed    Spouse name: Not on file   Number of children: Not on file   Years of education: Not on file   Highest education level: Not on file  Occupational History   Not on file  Tobacco Use   Smoking status: Former    Current packs/day: 0.00    Average packs/day: 1.0 packs/day    Types: Cigarettes    Quit date: 2009    Years since quitting: 17.0   Smokeless tobacco: Never  Vaping Use   Vaping status: Never Used  Substance and Sexual Activity   Alcohol use: No   Drug use: No   Sexual activity: Not Currently  Other Topics Concern   Not on file  Social History Narrative   Not on file   Social Drivers of Health   Tobacco Use: Medium Risk (02/29/2024)   [facility]   Patient History    Smoking Tobacco Use: Former    Smokeless Tobacco Use: Never    Passive Exposure: Not on file  Financial Resource Strain: Low Risk  (11/18/2023)   Received from [facility]   Overall Financial Resource Strain (CARDIA)    Difficulty of Paying Living Expenses: Not hard at all  Food Insecurity: No Food Insecurity (02/16/2024)   Epic    Worried About Radiation Protection Practitioner of Food in the Last Year: Never true    Ran Out of Food in the Last Year: Never true  Transportation Needs: No Transportation Needs (02/16/2024)   Epic    Lack of Transportation (Medical): No    Lack of Transportation (Non-Medical): No  Physical Activity: Not on file  Stress: Not on file  Social Connections: Moderately Isolated (02/16/2024)   Social Connection and Isolation Panel    Frequency of Communication with Friends and Family: More than three times a week    Frequency of Social Gatherings with Friends and Family: More than three times a week    Attends Religious Services: 1 to 4 times per year    Active Member of [employer]  or Organizations: No    Attends Banker Meetings: Never    Marital Status: Widowed  Depression (PHQ2-9): Not on file  Alcohol Screen: Not on file  Housing: Low Risk (02/16/2024)   Epic    Unable to Pay for Housing in the Last Year: No    Number of Times Moved in the Last Year: 0    Homeless in the Last Year: No  Utilities: Not At Risk (02/16/2024)   Epic    Threatened with loss of utilities: No  Health Literacy: Not on file   Review of Systems: A 12 point ROS discussed and pertinent positives are indicated in the HPI above.  All other systems are negative.  Review of Systems  Vital Signs: BP 114/67 (BP Location: Left Arm, Patient Position: Sitting, Cuff Size: Normal)   Pulse (!) 109   Temp 98.1 F (36.7 C) (Oral)   Resp 18   SpO2 96%   Physical Exam Constitutional:      General: She is not in acute distress.    Interventions: Nasal cannula in place.  HENT:     Head: Normocephalic and atraumatic.  Eyes:     General: No scleral icterus. Cardiovascular:     Rate and Rhythm: Normal rate.  Pulmonary:     Effort: Pulmonary effort is normal. No respiratory distress.  Abdominal:     General: There is no distension.     Tenderness: There is no abdominal tenderness.  Musculoskeletal:       Back:     Comments: TTP in the mid to lower thoracic spine as well as in the lower lumbar spine.  Difficult to pinpoint levels as she seems to be tender everytwhere  Neurological:     Mental Status: She is alert and oriented to person, place, and time.  Psychiatric:        Mood and Affect: Mood normal.        Behavior: Behavior normal.       Imaging: IR KYPHO THORACIC WITH BONE BIOPSY Result Date: 03/13/2024 CLINICAL DATA:  79 year old female with history of acute osteoporotic fracture of the T10 vertebral body. EXAM: FLUOROSCOPIC GUIDED KYPHOPLASTY OF THE T10 VERTEBRAL BODY COMPARISON:  02/16/2024 MEDICATIONS: As antibiotic prophylaxis, Ancef  2 gm IV was ordered  pre-procedure and administered intravenously within 1 hour of incision. ANESTHESIA/SEDATION: Moderate (conscious) sedation was employed during this procedure. A total of Versed  2 mg and Fentanyl  150 mcg was administered intravenously. Moderate Sedation Time: 22 minutes. The patient's level of consciousness and vital signs were monitored continuously by radiology nursing throughout the procedure under my direct supervision. FLUOROSCOPY TIME:  109.5 mGy reference air kerma COMPLICATIONS: None immediate. PROCEDURE: The procedure, risks (including but not limited to bleeding, infection, organ damage), benefits, and alternatives were explained to the patient. Questions regarding the procedure were encouraged and answered. The patient understands and consents to the procedure. The patient has suffered a fracture of the T10 vertebral body. It is recommended that patients aged 23 years or older be evaluated for possible testing or treatment of osteoporosis. A copy of this procedure report is sent to the patient's referring physician The patient was placed prone on the fluoroscopic table. The skin overlying the lower thoracic region was then prepped and draped in the usual sterile fashion. Maximal barrier sterile technique was utilized including caps, mask, sterile gowns, sterile gloves, sterile drape, hand hygiene and skin antiseptic. Intravenous Fentanyl  and Versed  were administered as conscious sedation during continuous cardiorespiratory monitoring by the radiology RN. The left pedicle at T10 was then infiltrated with 1% lidocaine  followed by the advancement of a Kyphon trocar needle through the left pedicle into the posterior one-third of the vertebral body. Subsequently, the osteo drill was advanced to the anterior third of the vertebral body. The osteo drill was retracted. Through the working cannula, a Kyphon inflatable bone tamp 15 x 3 was advanced and positioned with the distal marker approximately 5 mm from the  anterior aspect of the cortex. Appropriate positioning was confirmed on the AP projection.  At this time, the balloon was expanded using contrast via a Kyphon inflation syringe device via micro tubing. In similar fashion, the right T10 pedicle was infiltrated with 1% lidocaine  followed by the advancement of a second Kyphon trocar needle through the right pedicle into the posterior third of the vertebral body. Subsequently, the osteo drill was coaxially advanced to the anterior right third. The osteo drill was exchanged for a Kyphon inflatable bone tamp 15 x 3, advanced to the 5 mm of the anterior aspect of the cortex. The balloon was then expanded using contrast as above. Inflations were continued until there was near apposition with the superior end plate. At this time, methylmethacrylate mixture was reconstituted in the Kyphon bone mixing device system. This was then loaded into the delivery mechanism, attached to Kyphon bone fillers. The balloons were deflated and removed followed by the instillation of methylmethacrylate mixture with excellent filling in the AP and lateral projections. No extravasation was noted in the disk spaces or posteriorly into the spinal canal. No epidural venous contamination was seen. The working cannulae and the bone filler were then retrieved and removed. Hemostasis was achieved with manual compression. The patient tolerated the procedure well without immediate postprocedural complication. IMPRESSION: 1. Technically successful T10 vertebral body augmentation using balloon kyphoplasty. 2. Per CMS PQRS reporting requirements (PQRS Measure 24): Given the patient's age of greater than 50 and the fracture site (hip, distal radius, or spine), the patient should be tested for osteoporosis using DXA, and the appropriate treatment considered based on the DXA results. Ester Sides, MD [facility] Electronically Signed   By: Ester Sides  M.D.   On: 03/13/2024 13:09    Labs:  CBC: Recent Labs    02/21/24 0905 02/22/24 0754 02/23/24 0538 02/24/24 0543  WBC 13.4* 13.7* 14.7* 12.7*  HGB 11.1* 10.3* 10.0* 10.2*  HCT 35.0* 32.2* 31.5* 32.3*  PLT 422* 387 360 317    COAGS: Recent Labs    02/15/24 2323  INR 1.0  APTT 33    BMP: Recent Labs    02/19/24 0444 02/21/24 0905 02/23/24 0707 02/24/24 0543  NA 136 134* 136 134*  K 3.7 5.0 4.2 4.3  CL 93* 89* 91* 90*  CO2 35* 38* 38* 36*  GLUCOSE 178* 97 152* 90  BUN 36* 38* 39* 45*  CALCIUM  9.1 9.6 9.3 9.5  CREATININE 0.71 0.79 0.80 0.81  GFRNONAA >60 >60 >60 >60    LIVER FUNCTION TESTS: Recent Labs    02/15/24 1815  BILITOT 0.4  AST 22  ALT 32  ALKPHOS 83  PROT 7.0  ALBUMIN 4.0    TUMOR MARKERS: No results for input(s): AFPTM, CEA, CA199, CHROMGRNA in the last 8760 hours.  Assessment and Plan:  Unfortunate 79 yo female with severe age-related osteoporosis and numerous compression fractures s/p a total of 7 prior cement augmentation procedures, most recently at T10 on 03/13/24.    She continues to have severe pain which is concerning for one or more new fractures in the thoracic and lumbar spine.  Pain control is very challenging for her as she can not tolerate most pain medications.   1.) MRI thoracic and lumbar spine WO 2.) Begin OTC Aleve BID staggered with OTC acetaminophen  TID 3.) F/U once MRI is resulted to discuss options   Electronically Signed: Wilkie POUR Allante Whitmire 03/30/2024, 9:36 AM   I spent a total of  15 Minutes in face to face in clinical consultation, greater than 50%  of which was counseling/coordinating care for age-related osteoporosis with compression fractures, subsequent encounter   "

## 2024-04-01 ENCOUNTER — Ambulatory Visit
Admission: RE | Admit: 2024-04-01 | Discharge: 2024-04-01 | Disposition: A | Source: Ambulatory Visit | Attending: Interventional Radiology | Admitting: Interventional Radiology

## 2024-04-01 DIAGNOSIS — S22000A Wedge compression fracture of unspecified thoracic vertebra, initial encounter for closed fracture: Secondary | ICD-10-CM

## 2024-04-01 DIAGNOSIS — S32000A Wedge compression fracture of unspecified lumbar vertebra, initial encounter for closed fracture: Secondary | ICD-10-CM

## 2024-04-03 ENCOUNTER — Encounter: Payer: Self-pay | Admitting: Hematology and Oncology

## 2024-04-11 ENCOUNTER — Other Ambulatory Visit: Payer: Self-pay | Admitting: Interventional Radiology

## 2024-04-11 DIAGNOSIS — M545 Low back pain, unspecified: Secondary | ICD-10-CM

## 2024-04-12 ENCOUNTER — Ambulatory Visit
Admission: RE | Admit: 2024-04-12 | Discharge: 2024-04-12 | Disposition: A | Source: Ambulatory Visit | Attending: Interventional Radiology | Admitting: Interventional Radiology

## 2024-04-12 ENCOUNTER — Other Ambulatory Visit: Payer: Self-pay | Admitting: Interventional Radiology

## 2024-04-12 DIAGNOSIS — M545 Low back pain, unspecified: Secondary | ICD-10-CM

## 2024-04-13 ENCOUNTER — Other Ambulatory Visit: Payer: Self-pay | Admitting: Interventional Radiology

## 2024-04-13 DIAGNOSIS — G8929 Other chronic pain: Secondary | ICD-10-CM

## 2024-04-14 ENCOUNTER — Other Ambulatory Visit: Payer: Self-pay | Admitting: Radiology

## 2024-04-14 MED ORDER — NAPROXEN SODIUM 550 MG PO TABS: 550.0000 mg | ORAL_TABLET | Freq: Two times a day (BID) | ORAL | 0 refills | Status: AC
